# Patient Record
Sex: Male | Born: 1945
Health system: Southern US, Community
[De-identification: ages and names within clinical notes are randomized; demographics above are authoritative.]

## PROBLEM LIST (undated history)

## (undated) DIAGNOSIS — K219 Gastro-esophageal reflux disease without esophagitis: Secondary | ICD-10-CM

## (undated) DIAGNOSIS — E669 Obesity, unspecified: Secondary | ICD-10-CM

## (undated) DIAGNOSIS — N342 Other urethritis: Secondary | ICD-10-CM

## (undated) DIAGNOSIS — C801 Malignant (primary) neoplasm, unspecified: Secondary | ICD-10-CM

## (undated) DIAGNOSIS — E7800 Pure hypercholesterolemia, unspecified: Secondary | ICD-10-CM

## (undated) DIAGNOSIS — M1990 Unspecified osteoarthritis, unspecified site: Secondary | ICD-10-CM

## (undated) HISTORY — PX: COLONOSCOPY: SHX174

## (undated) HISTORY — DX: Obesity, unspecified: E66.9

## (undated) HISTORY — DX: Gastro-esophageal reflux disease without esophagitis: K21.9

## (undated) HISTORY — DX: Other urethritis: N34.2

## (undated) HISTORY — PX: JOINT REPLACEMENT: SHX530

---

## 2000-10-14 ENCOUNTER — Encounter: Payer: Self-pay | Admitting: Family Medicine

## 2000-10-14 ENCOUNTER — Encounter: Admission: RE | Admit: 2000-10-14 | Discharge: 2000-10-14 | Payer: Self-pay | Admitting: Family Medicine

## 2001-08-28 ENCOUNTER — Ambulatory Visit (HOSPITAL_COMMUNITY): Admission: RE | Admit: 2001-08-28 | Discharge: 2001-08-28 | Payer: Self-pay | Admitting: Family Medicine

## 2001-08-28 ENCOUNTER — Encounter: Payer: Self-pay | Admitting: Family Medicine

## 2005-03-16 ENCOUNTER — Ambulatory Visit (HOSPITAL_COMMUNITY): Admission: RE | Admit: 2005-03-16 | Discharge: 2005-03-16 | Payer: Self-pay | Admitting: Gastroenterology

## 2007-09-27 ENCOUNTER — Emergency Department (HOSPITAL_COMMUNITY): Admission: EM | Admit: 2007-09-27 | Discharge: 2007-09-27 | Payer: Self-pay | Admitting: Emergency Medicine

## 2007-09-27 IMAGING — CT CT HEAD W/O CM
1 series · 16 of 30 positions shown, 20 images · IV contrast (agent unspecified)
Comparison: [DATE].

CLINICAL DATA: Headache and dizziness.
 HEAD CT WITHOUT CONTRAST:
TECHNIQUE: Contiguous axial images were obtained from the base of the skull through the vertex according to standard protocol without contrast.

[Series 2: brain · axial · 0.47mm/px · z∈[+125,+274]mm · 16 of 32 slices shown, 20 images]
[im 2/32  brain]
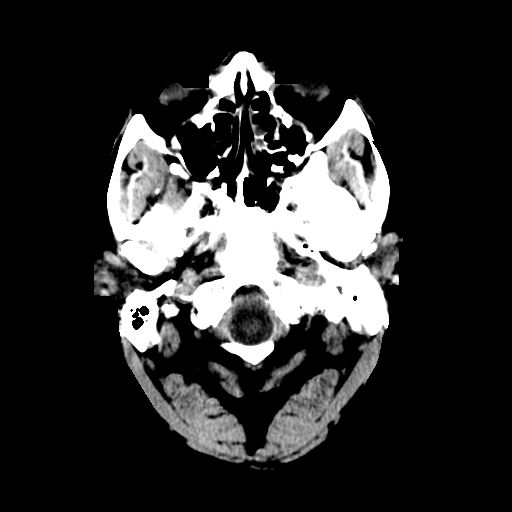
[im 2/32  bone]
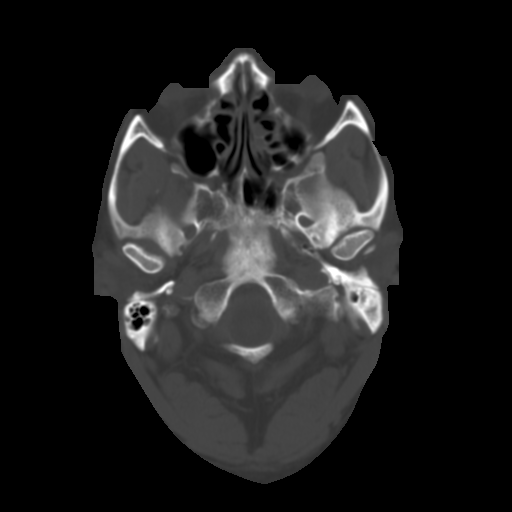
[im 4/32  brain]
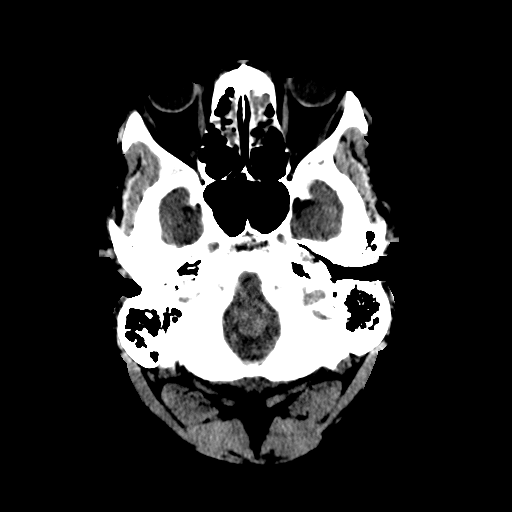
[im 6/32  brain]
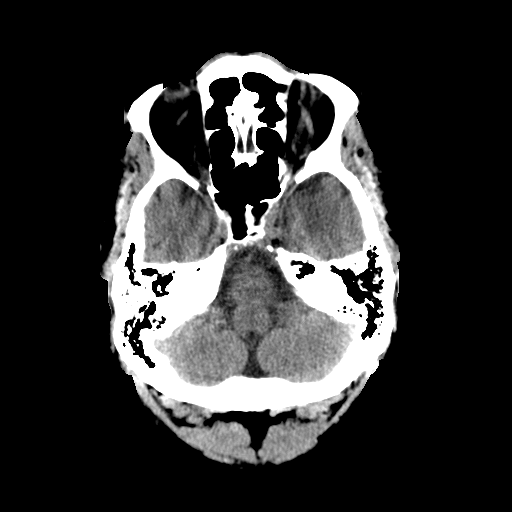
[im 8/32  brain]
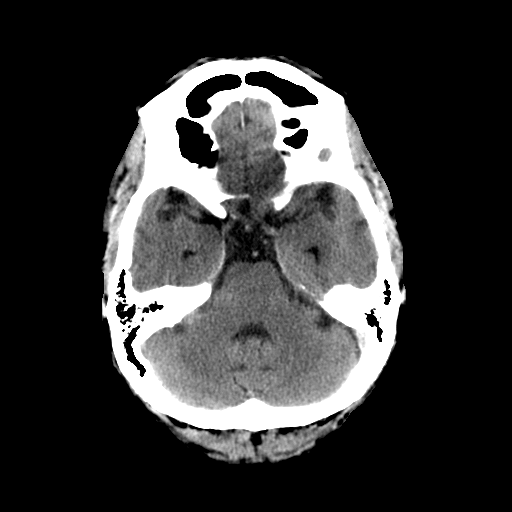
[im 9/32  brain]
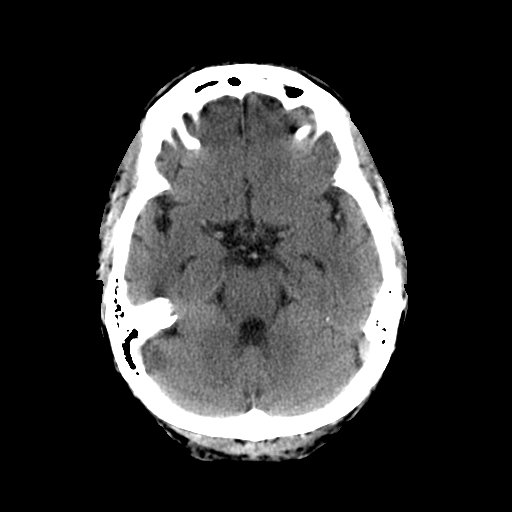
[im 9/32  bone]
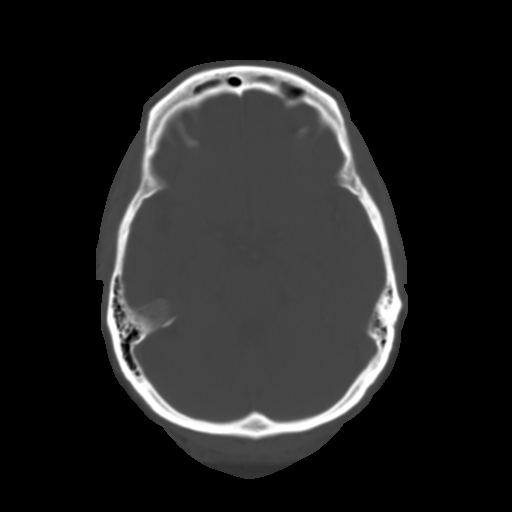
[im 11/32  brain]
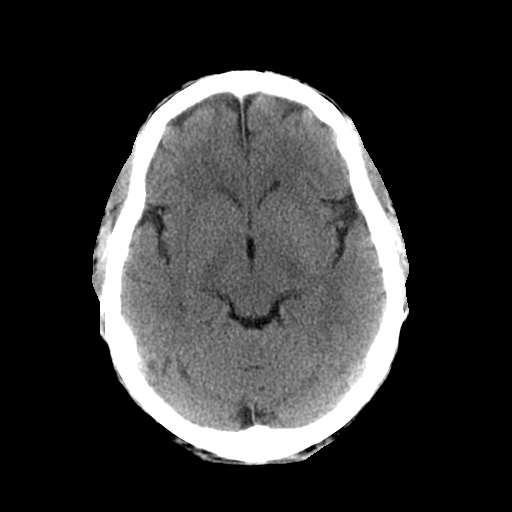
[im 13/32  brain]
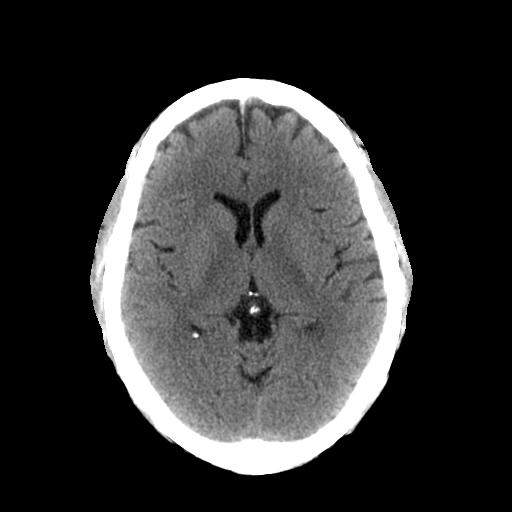
[im 15/32  brain]
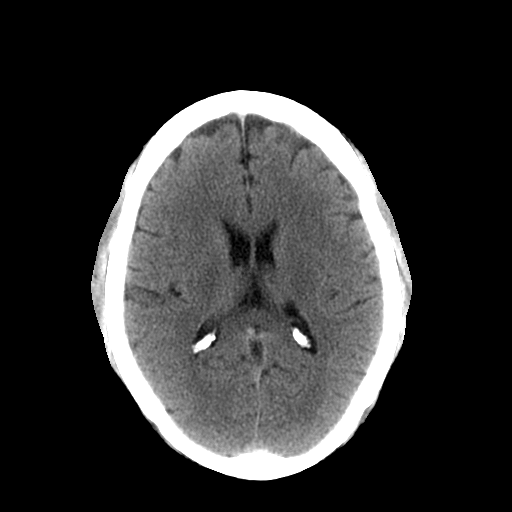
[im 17/32  brain]
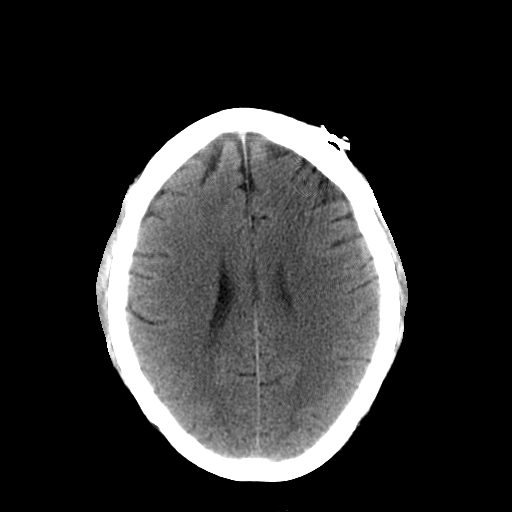
[im 17/32  bone]
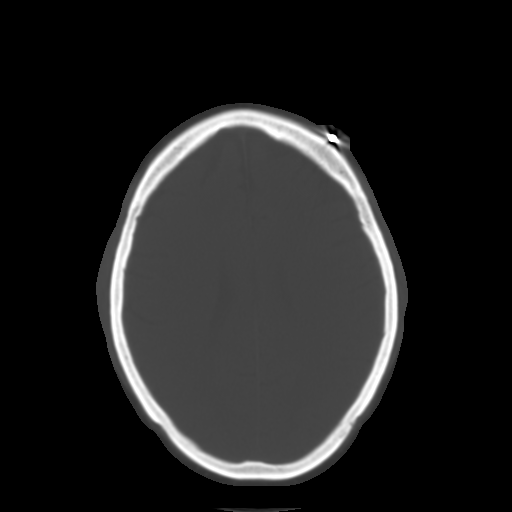
[im 19/32  brain]
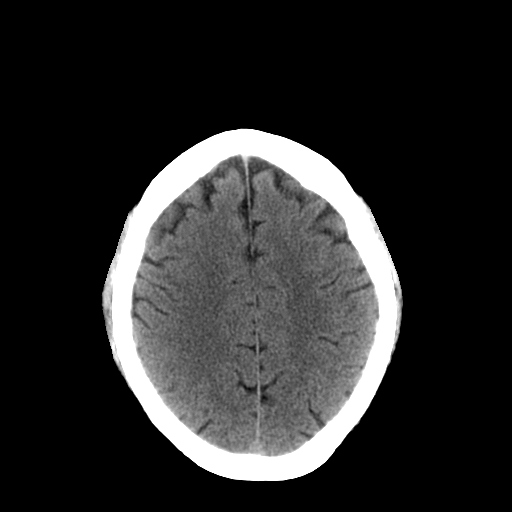
[im 21/32  brain]
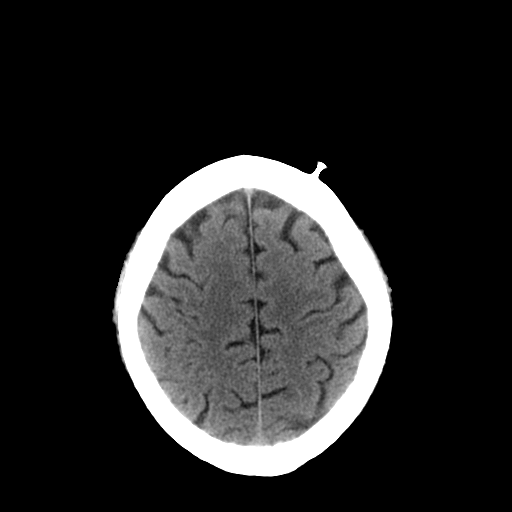
[im 23/32  brain]
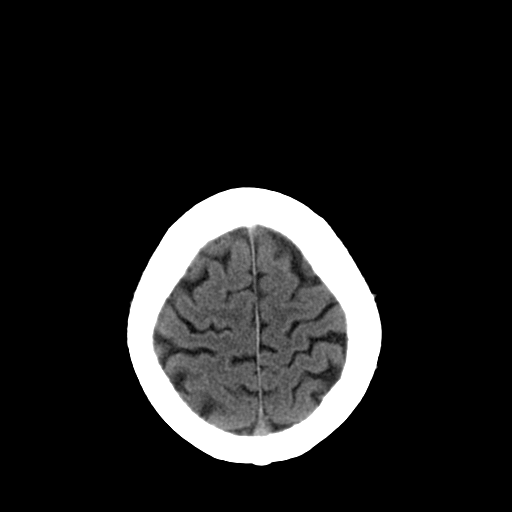
[im 24/32  brain]
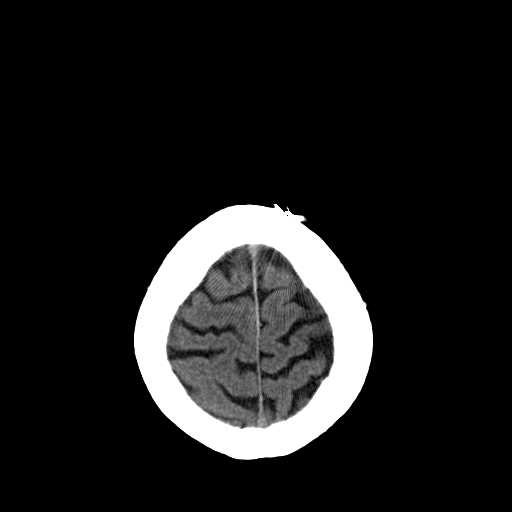
[im 24/32  bone]
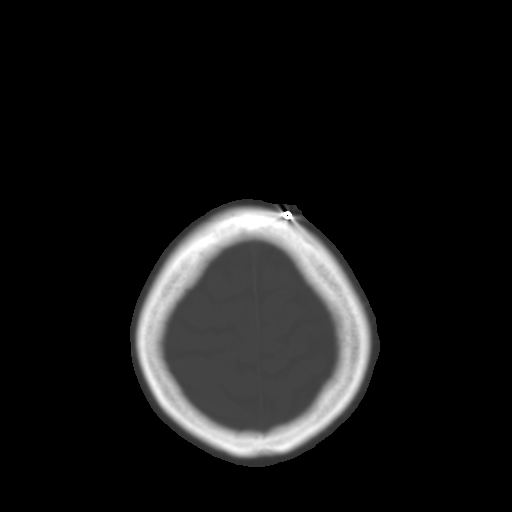
[im 26/32  brain]
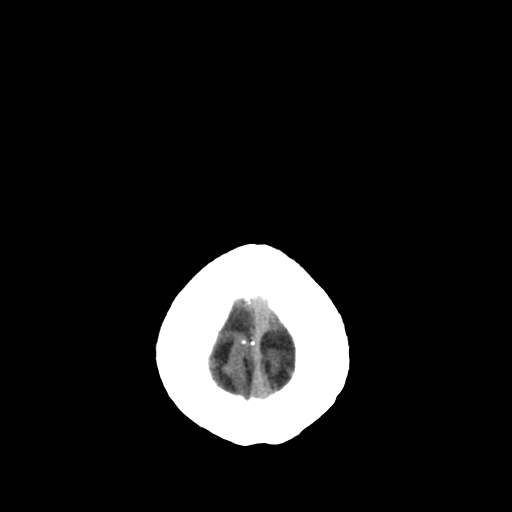
[im 28/32  brain]
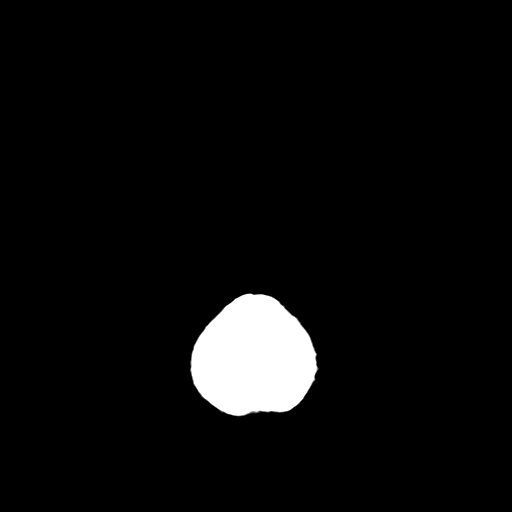
[im 30/32  brain]
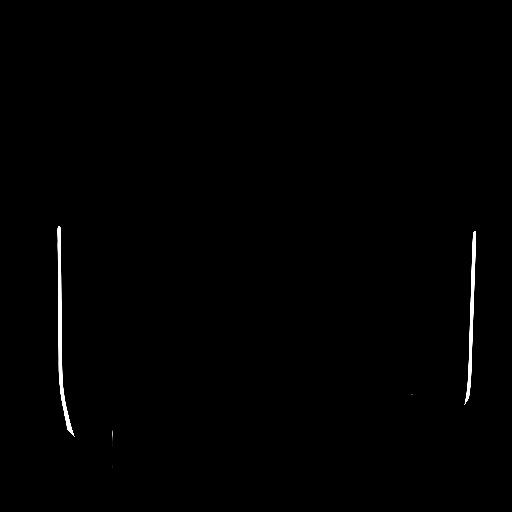

[16 of 30 positions shown; findings below may reference images not displayed]

FINDINGS: No intracranial hemorrhage. No CT evidence of large acute infarct. No intracranial mass detected on this unenhanced exam. Very minimal atrophy without hydrocephalus. Ethmoid sinus air cell and left maxillary sinus mucosal thickening. Minimal mucosal thickening left sphenoid sinus air cell.  Small radiopaque structure left frontal region unchanged.
IMPRESSION: 1.  No intracranial hemorrhage.
 2.  No CT evidence of large acute infarct.
 3.  No intracranial mass detected on this unenhanced exam.
 4.  Mild paranasal sinus mucosal thickening.

## 2011-05-07 LAB — CBC
HCT: 37.9 — ABNORMAL LOW
Hemoglobin: 13.1
MCHC: 34.6
MCV: 92.6
Platelets: 188
RBC: 4.1 — ABNORMAL LOW
RDW: 12.4
WBC: 10.8 — ABNORMAL HIGH

## 2011-05-07 LAB — POCT I-STAT CREATININE
Creatinine, Ser: 1.2
Operator id: 234501

## 2011-05-07 LAB — CK TOTAL AND CKMB (NOT AT ARMC)
CK, MB: 2.3
Relative Index: 1
Total CK: 223

## 2011-05-07 LAB — I-STAT 8, (EC8 V) (CONVERTED LAB)
Acid-Base Excess: 3 — ABNORMAL HIGH
BUN: 9
Bicarbonate: 28.4 — ABNORMAL HIGH
Chloride: 104
Glucose, Bld: 97
HCT: 43
Hemoglobin: 14.6
Operator id: 234501
Potassium: 4.2
Sodium: 137
TCO2: 30
pCO2, Ven: 43.2 — ABNORMAL LOW
pH, Ven: 7.426 — ABNORMAL HIGH

## 2011-05-07 LAB — DIFFERENTIAL
Basophils Absolute: 0
Basophils Relative: 0
Eosinophils Absolute: 0.4
Eosinophils Relative: 3
Lymphocytes Relative: 25
Lymphs Abs: 2.7
Monocytes Absolute: 0.6
Monocytes Relative: 5
Neutro Abs: 7.2
Neutrophils Relative %: 66

## 2011-05-07 LAB — TROPONIN I: Troponin I: 0.01

## 2011-09-29 ENCOUNTER — Emergency Department (HOSPITAL_COMMUNITY): Payer: Medicare Other

## 2011-09-29 ENCOUNTER — Encounter (HOSPITAL_COMMUNITY): Payer: Self-pay | Admitting: Emergency Medicine

## 2011-09-29 ENCOUNTER — Emergency Department (HOSPITAL_COMMUNITY)
Admission: EM | Admit: 2011-09-29 | Discharge: 2011-09-29 | Disposition: A | Discharge status: Home / Self Care | Payer: Medicare Other | Attending: Emergency Medicine | Admitting: Emergency Medicine

## 2011-09-29 DIAGNOSIS — T148XXA Other injury of unspecified body region, initial encounter: Secondary | ICD-10-CM

## 2011-09-29 DIAGNOSIS — IMO0002 Reserved for concepts with insufficient information to code with codable children: Secondary | ICD-10-CM

## 2011-09-29 DIAGNOSIS — W19XXXA Unspecified fall, initial encounter: Secondary | ICD-10-CM | POA: Insufficient documentation

## 2011-09-29 DIAGNOSIS — S61209A Unspecified open wound of unspecified finger without damage to nail, initial encounter: Secondary | ICD-10-CM | POA: Insufficient documentation

## 2011-09-29 IMAGING — CR DG HAND COMPLETE 3+V*R*
3 series · 3 of 3 positions shown · non-contrast
Comparison: None.

CLINICAL DATA: Fall with right fifth finger pain and laceration.

RIGHT HAND - COMPLETE 3+ VIEW

[x hand pa right]
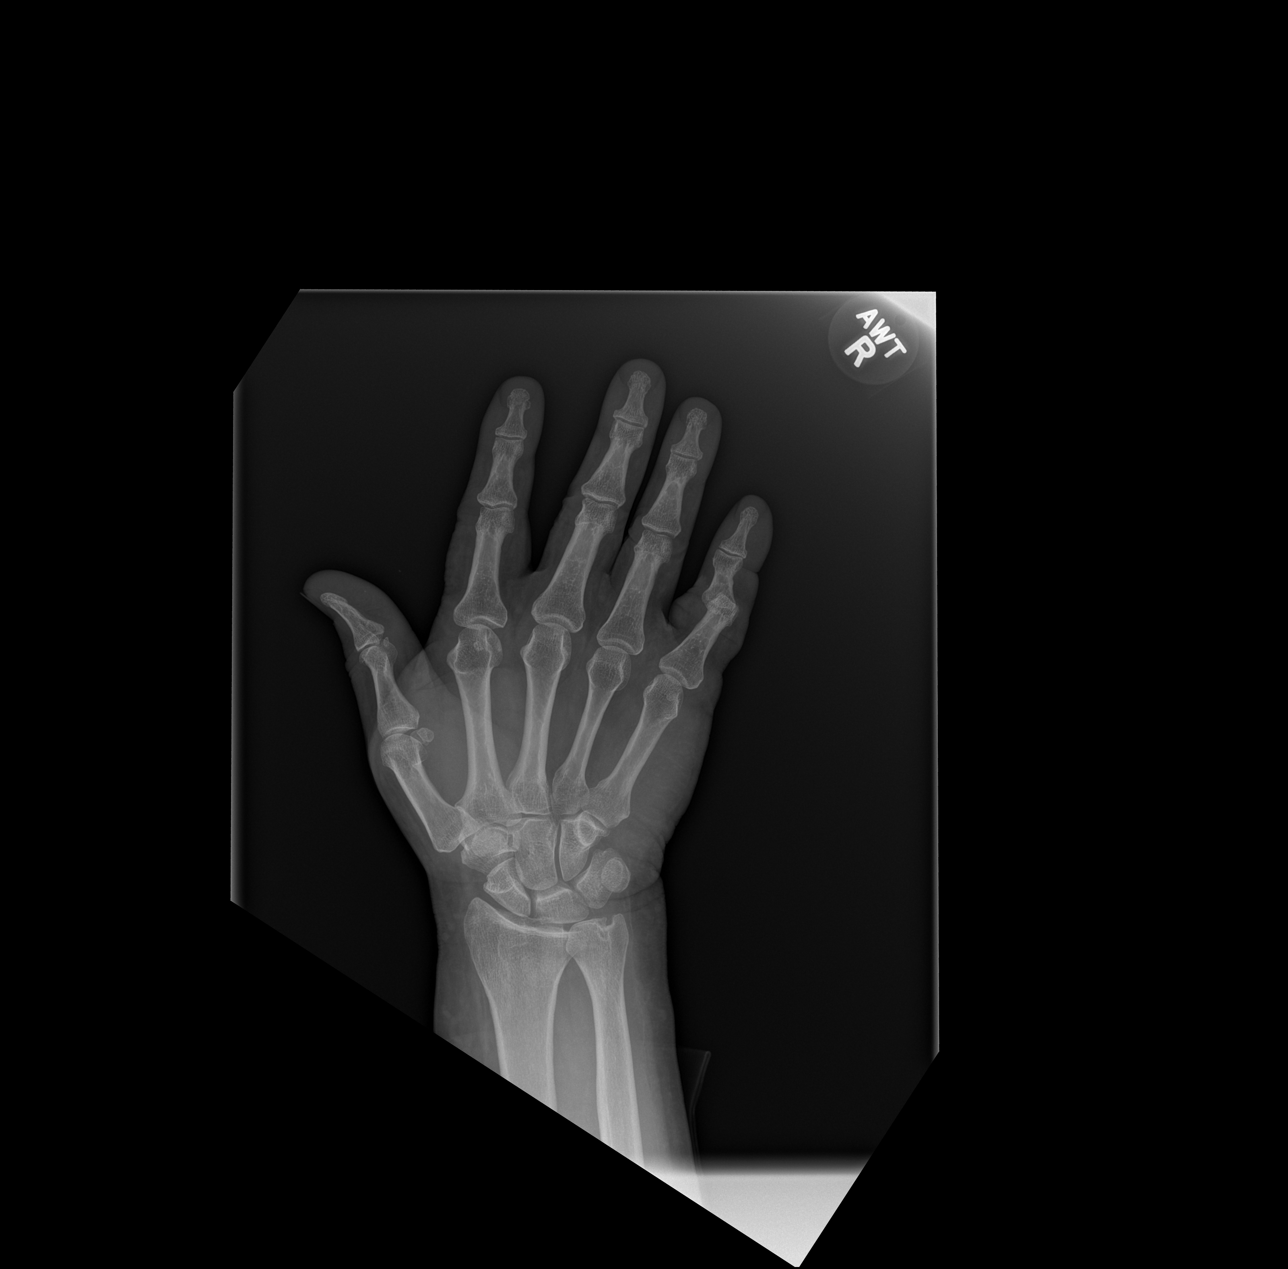

[x hand obl right]
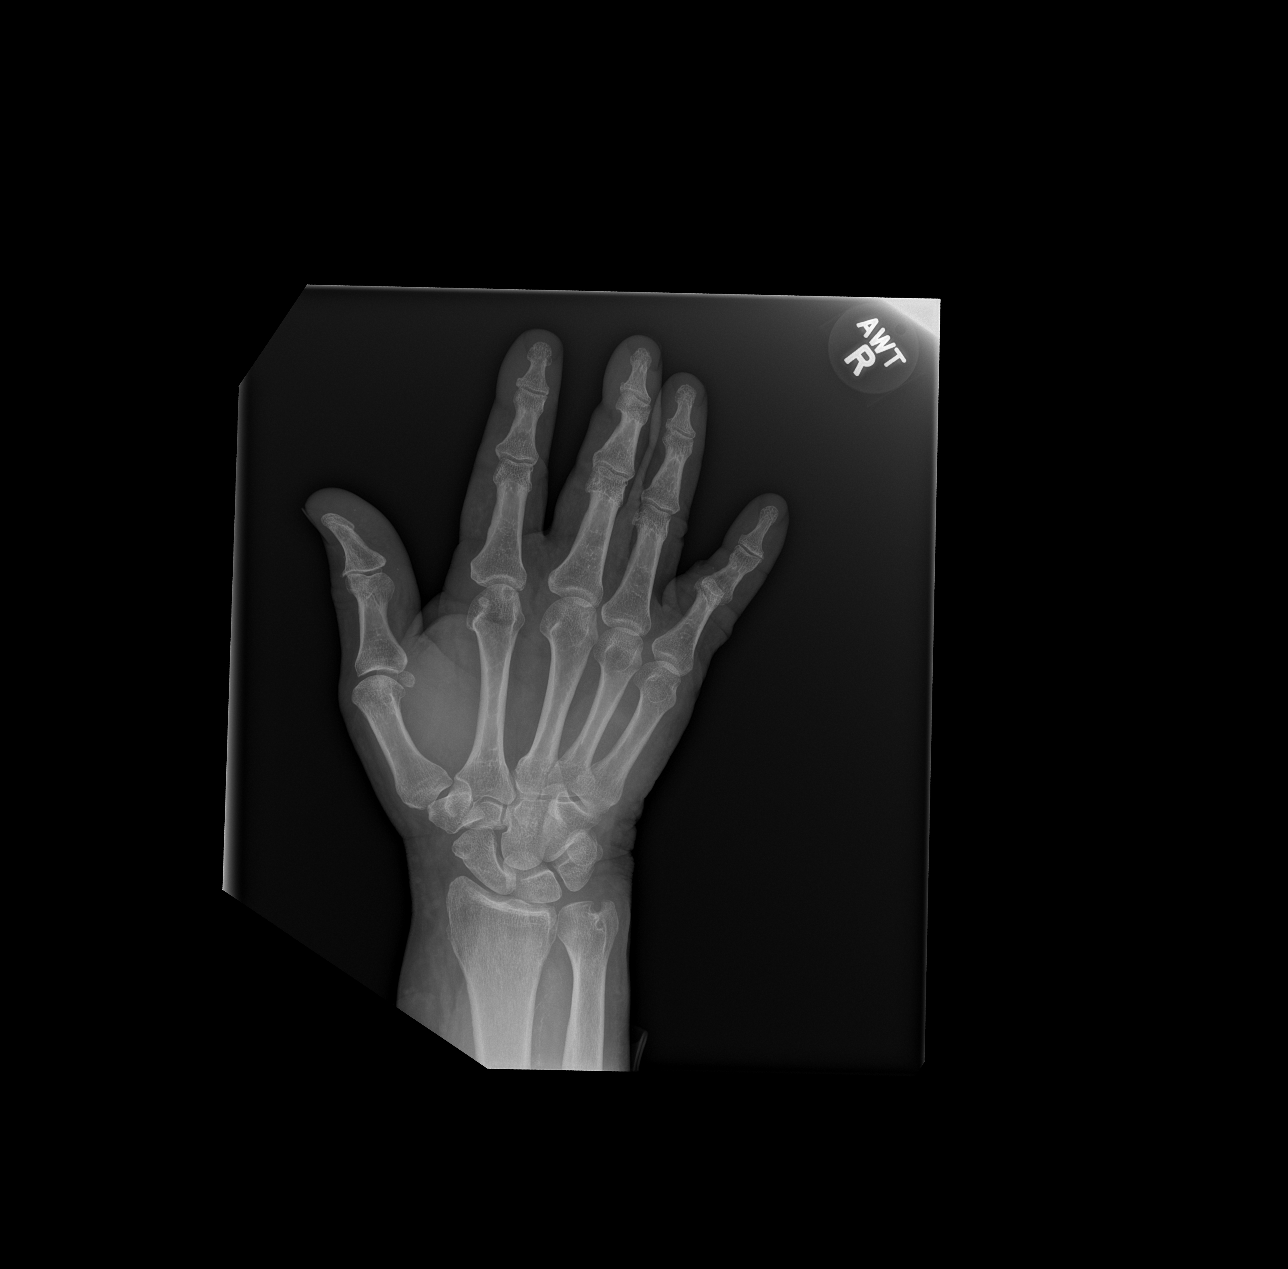

[x hand lat right]
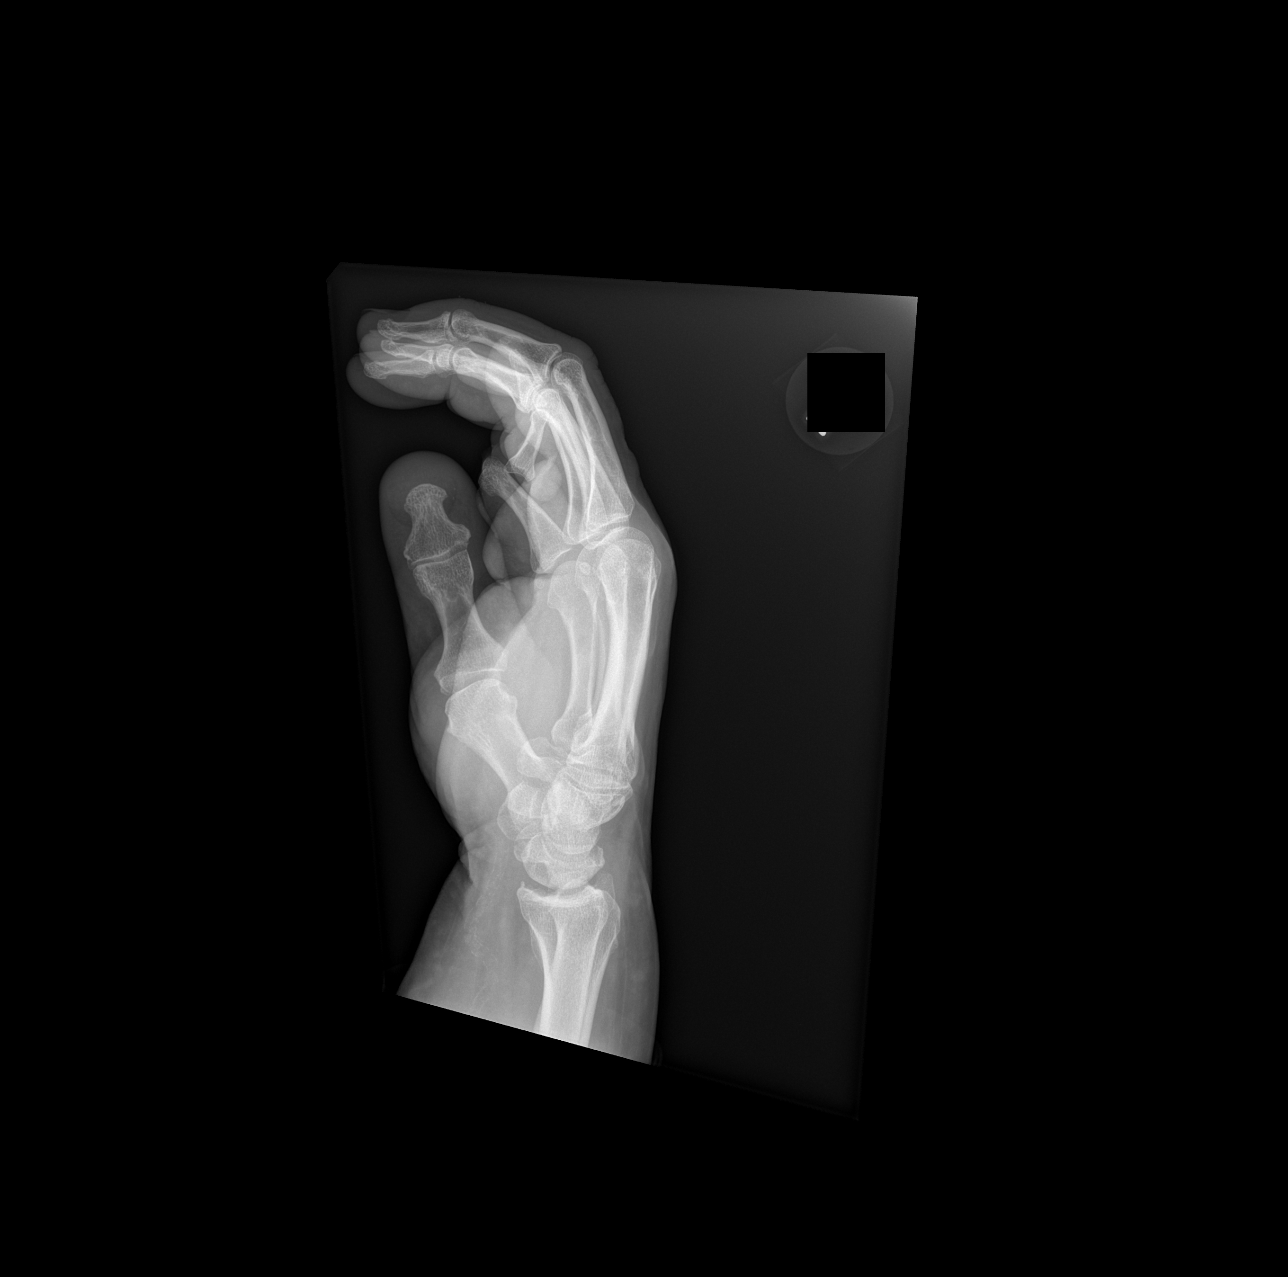

[3 of 3 positions shown; findings below may reference images not displayed]

FINDINGS: The fifth proximal interphalangeal joint is dislocated,
with override and dorsal displacement of the middle phalanx.  No
definite associated fracture.  Overlying soft tissue injury.  There
may be faint radiodense debris associated with the laceration.
IMPRESSION: Fifth proximal interphalangeal joint dislocation.

## 2011-09-29 MED ORDER — CEFAZOLIN SODIUM 1-5 GM-% IV SOLN
1.0000 g | Freq: Once | INTRAVENOUS | Status: AC
  Administered 2011-09-29 (×2): 1 g via INTRAVENOUS
  Filled 2011-09-29: qty 50

## 2011-09-29 MED ORDER — TETANUS-DIPHTH-ACELL PERTUSSIS 5-2.5-18.5 LF-MCG/0.5 IM SUSP
0.5000 mL | Freq: Once | INTRAMUSCULAR | Status: AC
  Administered 2011-09-29: 0.5 mL via INTRAMUSCULAR
  Filled 2011-09-29: qty 0.5

## 2011-09-29 MED ORDER — CEPHALEXIN 500 MG PO CAPS: 500.0000 mg | ORAL_CAPSULE | Freq: Four times a day (QID) | ORAL | Status: AC

## 2011-09-29 MED ORDER — LIDOCAINE HCL 2 % IJ SOLN
INTRAMUSCULAR | Status: AC
  Filled 2011-09-29: qty 1

## 2011-09-29 MED ORDER — OXYCODONE-ACETAMINOPHEN 5-325 MG PO TABS: 1.0000 | ORAL_TABLET | Freq: Four times a day (QID) | ORAL | Status: AC | PRN

## 2011-09-29 NOTE — Discharge Instructions (Signed)
Call Dr. Ronie Spies office today to schedule a follow up appointment for tomorrow or Friday morning. Follow up with your doctor, an urgent care, or this Emergency Department for removal of your stitches in 7 days. Do not submerge the stitches in water for the first 24 hours. Take your pain medication as prescribed. Do not operate heavy machinery while on pain medication. Note that your pain medication contains acetaminophen (Tylenol) & its is not reccommended that you use additional acetaminophen (Tylenol) while taking this medication. Read instructions below.  TREATMENT   Keep the wound clean and dry.   If you were given a bandage (dressing), you should change it at least once a day. Also, change the dressing if it becomes wet or dirty, or as directed by your caregiver.   Wash the wound with soap and water 2 times a day. Rinse the wound off with water to remove all soap. Pat the wound dry with a clean towel.   You may shower as usual after the first 24 hours. Do not soak the wound in water until the sutures are removed.   Once the wound has healed, scarring can be minimized by covering the wound with sunscreen during the day for 1 full year.Marland Kitchen   SEEK MEDICAL CARE IF:   You have redness, swelling, or increasing pain in the wound.   You see a red line that goes away from the wound.   You have yellowish-white fluid (pus) coming from the wound.   You have a fever.   You notice a bad smell coming from the wound or dressing.   Your wound breaks open before or after sutures have been removed.   You notice something coming out of the wound such as wood or glass.   Your wound is on your hand or foot and you cannot move a finger or toe.   Your pain is not controlled with prescribed medicine.   If you did not receive a tetanus shot today because you thought you were up to date, but did not recall when your last one was given, make sure to check with your primary caregiver to determine if you  need one.

## 2011-09-29 NOTE — ED Notes (Signed)
Correction right pinky

## 2011-09-29 NOTE — ED Provider Notes (Signed)
Medical screening examination/treatment/procedure(s) were conducted as a shared visit with non-physician practitioner(s) and myself.  I personally evaluated the patient during the encounter  Open R 5 th PIP dislocation. Reduced in ED, closed. Td updated, IV abx. Discussed with Hand. F/u in 1-2 days.   Loren Racer, MD 09/29/11 [phone]

## 2011-09-29 NOTE — ED Notes (Signed)
Lost his "footing" and fell on right hand. Obvious exposed bone. Bleeding controlled

## 2011-09-29 NOTE — ED Notes (Signed)
Confirmed tendon exposure by Lissette PA

## 2011-09-29 NOTE — ED Provider Notes (Signed)
History     CSN: [id number]  Arrival date & time 09/29/11  1256   First MD Initiated Contact with Patient 09/29/11 1307      Chief Complaint  Patient presents with  . Finger Injury    (Consider location/radiation/quality/duration/timing/severity/associated sxs/prior treatment) HPI Comments: Patient presents emergency department with a finger laceration on his right hand little finger. Patient states he lost stepping and fell.  When he fell he dislocated his pinky. Patient states he is unable to bend his finger. Patient states that it has been bleeding sense and he sees something light.  Patient denies syncope, chest pain, shortness of breath, HA, dizziness, light headedness. Injury occurred 12:30PM. Tetanus status unknown. Denies fever, night sweats and chills.   The history is provided by the patient.    History reviewed. No pertinent past medical history.  History reviewed. No pertinent past surgical history.  No family history on file.  History  Substance Use Topics  . Smoking status: Never Smoker   . Smokeless tobacco: Not on file  . Alcohol Use: No      Review of Systems  Constitutional: Negative for fever, chills and appetite change.  HENT: Negative for congestion.   Eyes: Negative for visual disturbance.  Respiratory: Negative for shortness of breath.   Cardiovascular: Negative for chest pain and leg swelling.  Gastrointestinal: Negative for abdominal pain.  Genitourinary: Negative for dysuria, urgency and frequency.  Skin: Positive for wound.  Neurological: Negative for dizziness, syncope, weakness, light-headedness, numbness and headaches.  Psychiatric/Behavioral: Negative for confusion.    Allergies  Review of patient's allergies indicates no known allergies.  Home Medications  No current outpatient prescriptions on file.  BP 114/77  Pulse 93  Temp(Src) 97.9 F (36.6 C) (Oral)  Resp 23  Wt 250 lb (113.399 kg)  SpO2 99%  Physical Exam  Nursing  note and vitals reviewed. Constitutional: He is oriented to person, place, and time. He appears well-developed and well-nourished. No distress.  HENT:  Head: Normocephalic and atraumatic.  Eyes: Conjunctivae and EOM are normal.  Neck: Normal range of motion.  Cardiovascular: Normal rate and regular rhythm.   Pulmonary/Chest: Effort normal.  Musculoskeletal: Normal range of motion.       3 cm open laceration of right small finger. Bone exposed via dislocation. Pt unable to perform flexion. Sensory intact. No ttp of wrist, FROM active an passive of right rist, pulse intact.   Neurological: He is alert and oriented to person, place, and time.  Skin: Skin is warm and dry. No rash noted. He is not diaphoretic.  Psychiatric: He has a normal mood and affect. His behavior is normal.    ED Course  Procedures (including critical care time)  Labs Reviewed - No data to display Dg Hand Complete Right  09/29/2011  *RADIOLOGY REPORT*  Clinical Data: Fall with right fifth finger pain and laceration.  RIGHT HAND - COMPLETE 3+ VIEW  Comparison: None.  Findings: The fifth proximal interphalangeal joint is dislocated, with override and dorsal displacement of the middle phalanx.  No definite associated fracture.  Overlying soft tissue injury.  There may be faint radiodense debris associated with the laceration.  IMPRESSION: Fifth proximal interphalangeal joint dislocation.  Original Report Authenticated By: Reyes Ivan, M.D.   LACERATION REPAIR Performed by: Jaci Carrel Authorized by: Jaci Carrel Consent: Verbal consent obtained. Risks and benefits: risks, benefits and alternatives were discussed Consent given by: patient Patient identity confirmed: provided demographic data Prepped and Draped in normal sterile fashion  Wound explored  Laceration Location: palmer surface, right hand, sm finger  Laceration Length: 3cm  No Foreign Bodies seen or palpated  Anesthesia: local infiltration  Local  anesthetic: lidocaine 2 % with out epinephrine  Anesthetic total: 2 ml, digital block   Irrigation method: syringe Amount of cleaning: moderate  Skin closure: 4.0 prolene  Number of sutures: 6  Technique: simple interupted  Patient tolerance: Patient tolerated the procedure well with no immediate complications.   No diagnosis found.    MDM  Joint dislocation, open   Fifth proximal interphalangeal joint dislocation with laceration.  Wound explored with moderate cleaning.  Patient joint relocated and laceration repaired after digital block performed. No evidence of tendon involvement after relocation. Patient able to flex and extend digit. Patient tolerated well. Pt to be discharged home with followup with Dr. Mina Marble, hand orthopedic.  Patient is advised to call his office this afternoon to schedule followup appointment for tomorrow or Friday AM.  Patient received 1 g IV Ancef & will be discharged with Keflex and pain medication. Patient is clinically s.table prior to discharge, has no complaints, and is agreeable with plan to followup. This patient was seen with and discussed with Dr. Ranae Palms who agrees with above plan.           Jaci Carrel, PA-C 09/29/11 [address], [place] 09/29/11 [phone]

## 2012-01-22 DIAGNOSIS — Z0000 Encounter for general adult medical examination without abnormal findings: Secondary | ICD-10-CM

## 2013-03-20 ENCOUNTER — Telehealth: Payer: Self-pay | Admitting: Family Medicine

## 2013-03-20 ENCOUNTER — Ambulatory Visit (INDEPENDENT_AMBULATORY_CARE_PROVIDER_SITE_OTHER): Payer: Medicare Other | Admitting: Family Medicine

## 2013-03-20 ENCOUNTER — Encounter: Payer: Self-pay | Admitting: Family Medicine

## 2013-03-20 VITALS — BP 119/65 | HR 60 | Temp 98.1°F | Ht 66.5 in | Wt 273.8 lb

## 2013-03-20 DIAGNOSIS — N342 Other urethritis: Secondary | ICD-10-CM

## 2013-03-20 DIAGNOSIS — N451 Epididymitis: Secondary | ICD-10-CM | POA: Insufficient documentation

## 2013-03-20 DIAGNOSIS — E669 Obesity, unspecified: Secondary | ICD-10-CM

## 2013-03-20 DIAGNOSIS — Z119 Encounter for screening for infectious and parasitic diseases, unspecified: Secondary | ICD-10-CM

## 2013-03-20 DIAGNOSIS — R300 Dysuria: Secondary | ICD-10-CM

## 2013-03-20 DIAGNOSIS — N453 Epididymo-orchitis: Secondary | ICD-10-CM

## 2013-03-20 LAB — POCT UA - MICROSCOPIC ONLY
Bacteria, U Microscopic: NEGATIVE
Casts, Ur, LPF, POC: NEGATIVE
Crystals, Ur, HPF, POC: NEGATIVE
Mucus, UA: NEGATIVE
RBC, urine, microscopic: NEGATIVE
WBC, Ur, HPF, POC: NEGATIVE
Yeast, UA: NEGATIVE

## 2013-03-20 LAB — POCT URINALYSIS DIPSTICK
Bilirubin, UA: NEGATIVE
Blood, UA: NEGATIVE
Glucose, UA: NEGATIVE
Ketones, UA: NEGATIVE
Leukocytes, UA: NEGATIVE
Nitrite, UA: NEGATIVE
Spec Grav, UA: 1.02
Urobilinogen, UA: NEGATIVE
pH, UA: 7

## 2013-03-20 MED ORDER — DOXYCYCLINE HYCLATE 100 MG PO TABS: 100.0000 mg | ORAL_TABLET | Freq: Two times a day (BID) | ORAL | Status: DC

## 2013-03-20 NOTE — Patient Instructions (Signed)
Urethritis, Adult Urethritis is an inflammation (soreness) of the urethra (the tube exiting from the bladder). It is often caused by germs that may be spread through sexual contact. TREATMENT  Urethritis will usually respond to antibiotics. These are medications that kill germs. Take all the medicine given to you. You may feel better in a couple days, but TAKE ALL MEDICINE or the infection may not be completely cured and may become more difficult to treat. Response can generally be expected in 7 to 10 days. You may require additional treatment after more testing. HOME CARE INSTRUCTIONS  Not have sex until the test results are known and treatment is completed.  Know that you may be asked to notify your sex partner when your final test results are back.  Finish all medications as prescribed.  Prevent sexually transmitted infections including AIDS. Practice safe sex. Use condoms. SEEK MEDICAL CARE IF:   Your symptoms are not improved in 2 to 3 days.  Your symptoms are getting worse.  Your develop abdominal pain.  You develop joint pain. SEEK IMMEDIATE MEDICAL CARE IF:   You have a fever.  You develop severe pain in the belly, back or side.  You develop repeated vomiting. TEST RESULTS Not all test results are available during your visit. If your test results are not back during the visit, make an appointment with your caregiver to find out the results. Do not assume everything is normal if you have not heard from your caregiver or the medical facility. It is important for you to follow-up on all of your test results. Document Released: 01/26/2001 Document Revised: 10/25/2011 Document Reviewed: 08/18/2009 [facility] Patient Information 2014 [place], [place].        Dr Woodroe Mode Recommendations  Diet and Exercise discussed with patient.  For nutrition information, I recommend books:  1).Eat to Live by Dr Monico Hoar. 2).Prevent and Reverse Heart Disease by Dr Suzzette Righter. 3) Dr Katherina Right Book:  Program to Reverse Diabetes  Exercise recommendations are:  If unable to walk, then the patient can exercise in a chair 3 times a day. By flapping arms like a bird gently and raising legs outwards to the front.  If ambulatory, the patient can go for walks for 30 minutes 3 times a week. Then increase the intensity and duration as tolerated.  Goal is to try to attain exercise frequency to 5 times a week.  If applicable: Best to perform resistance exercises (machines or weights) 2 days a week and cardio type exercises 3 days per week.

## 2013-03-20 NOTE — Telephone Encounter (Signed)
Pt aware of 2 wk appt -

## 2013-03-20 NOTE — Progress Notes (Signed)
Patient ID: Derek [name]., male   DOB: 1945/11/10, 67 y.o.   MRN: [id number] SUBJECTIVE: CC: Chief Complaint  Patient presents with  . Acute Visit    ?prostate inf c/o little burning    HPI: Tip of penis burns when he urinates. No d/c, no rashes. Sex+++ partner for 2 months. She is 42 years old.  Wants to learn how to eat right and  Diet and exercise.  Past Medical History  Diagnosis Date  . GERD (gastroesophageal reflux disease)    No past surgical history on file. History   Social History  . Marital Status: Married    Spouse Name: N/A    Number of Children: N/A  . Years of Education: N/A   Occupational History  . Not on file.   Social History Main Topics  . Smoking status: Never Smoker   . Smokeless tobacco: Not on file  . Alcohol Use: No  . Drug Use: No  . Sexually Active:    Other Topics Concern  . Not on file   Social History Narrative  . No narrative on file   No family history on file. No current outpatient prescriptions on file prior to visit.   No current facility-administered medications on file prior to visit.   No Known Allergies Immunization History  Administered Date(s) Administered  . Tdap 09/29/2011   Prior to Admission medications   Medication Sig Start Date End Date Taking? Authorizing Provider  omeprazole (PRILOSEC) 20 MG capsule Take 20 mg by mouth daily.   Yes Historical Provider, MD     ROS: As above in the HPI. All other systems are stable or negative.  OBJECTIVE: APPEARANCE:  Patient in no acute distress.The patient appeared well nourished and normally developed. Acyanotic. Waist: VITAL SIGNS:BP 119/65  Pulse 60  Temp(Src) 98.1 F (36.7 C) (Oral)  Ht 5' 6.5" (1.689 m)  Wt 273 lb 12.8 oz (124.195 kg)  BMI 43.54 kg/m2 AAM Obese  SKIN: warm and  Dry without overt rashes, tattoos and scars  HEAD and Neck: without JVD, Head and scalp: normal Eyes:No scleral icterus. Fundi normal, eye movements normal. Ears: Auricle  normal, canal normal, Tympanic membranes normal, insufflation normal. Nose: normal Throat: normal Neck & thyroid: normal  CHEST & LUNGS: Chest wall: normal Lungs: Clear  CVS: Reveals the PMI to be normally located. Regular rhythm, First and Second Heart sounds are normal,  absence of murmurs, rubs or gallops. Peripheral vasculature: Radial pulses: normal Dorsal pedis pulses: normal Posterior pulses: normal  ABDOMEN:  Appearance: Obese Benign, no organomegaly, no masses, no Abdominal Aortic enlargement. No Guarding , no rebound. No Bruits. Bowel sounds: normal  RECTAL: N/A GU: N/A  EXTREMETIES: nonedematous. Both Femoral and Pedal pulses are normal.  MUSCULOSKELETAL:  Spine: normal Joints: intact  NEUROLOGIC: oriented to time,place and person; nonfocal. Strength is normal Sensory is normal Reflexes are normal Cranial Nerves are normal.  ASSESSMENT: Burning with urination - Plan: POCT urinalysis dipstick, POCT UA - Microscopic Only, Urine culture, GC/Chlamydia Probe Amp, CANCELED: GC/Chlamydia Probe Amp  Urethritis - Plan: Urine culture, doxycycline (VIBRA-TABS) 100 MG tablet, GC/Chlamydia Probe Amp, CANCELED: GC/Chlamydia Probe Amp, CANCELED: GC/Chlamydia Probe Amp  Epididymitis - Plan: GC/Chlamydia Probe Amp, CANCELED: GC/Chlamydia Probe Amp, CANCELED: GC/Chlamydia Probe Amp  Screening examination for infectious disease - Plan: Hepatitis panel, acute, RPR, HIV antibody, Herpes simplex typing, GC/Chlamydia Probe Amp, CANCELED: GC/Chlamydia Probe Amp  Obesity, unspecified   In view of his h/o treated for a STD when in the [employer],  we discussed screening for infectious disease.  PLAN: Orders Placed This Encounter  Procedures  . Urine culture  . Herpes simplex typing  . GC/Chlamydia Probe Amp  . Hepatitis panel, acute  . RPR  . HIV antibody  . POCT urinalysis dipstick  . POCT UA - Microscopic Only    Safe sex. Use a condom. Ensure his partner has the  relevant STD screens.  Meds ordered this encounter  Medications  . omeprazole (PRILOSEC) 20 MG capsule    Sig: Take 20 mg by mouth daily.  Marland Kitchen doxycycline (VIBRA-TABS) 100 MG tablet    Sig: Take 1 tablet (100 mg total) by mouth 2 (two) times daily.    Dispense:  20 tablet    Refill:  0    Results for orders placed in visit on 03/20/13  POCT URINALYSIS DIPSTICK      Result Value Range   Color, UA amber     Clarity, UA clear     Glucose, UA neg     Bilirubin, UA neg     Ketones, UA neg     Spec Grav, UA 1.020     Blood, UA neg     pH, UA 7.0     Protein, UA small     Urobilinogen, UA negative     Nitrite, UA neg     Leukocytes, UA Negative    POCT UA - MICROSCOPIC ONLY      Result Value Range   WBC, Ur, HPF, POC neg     RBC, urine, microscopic neg     Bacteria, U Microscopic neg     Mucus, UA neg     Epithelial cells, urine per micros occ     Crystals, Ur, HPF, POC neg     Casts, Ur, LPF, POC neg     Yeast, UA neg          Dr Woodroe Mode Recommendations  Diet and Exercise discussed with patient.  For nutrition information, I recommend books:  1).Eat to Live by Dr Monico Hoar. 2).Prevent and Reverse Heart Disease by Dr Suzzette Righter. 3) Dr Katherina Right Book:  Program to Reverse Diabetes  Exercise recommendations are:  If unable to walk, then the patient can exercise in a chair 3 times a day. By flapping arms like a bird gently and raising legs outwards to the front.  If ambulatory, the patient can go for walks for 30 minutes 3 times a week. Then increase the intensity and duration as tolerated.  Goal is to try to attain exercise frequency to 5 times a week.  If applicable: Best to perform resistance exercises (machines or weights) 2 days a week and cardio type exercises 3 days per week.  Return in about 2 weeks (around 04/03/2013) for Recheck medical problems.  Joffrey Kerce P. [place]esto Charon, M.D.

## 2013-03-21 LAB — SPECIMEN STATUS REPORT

## 2013-03-21 LAB — URINE CULTURE

## 2013-03-21 LAB — RPR: RPR: NONREACTIVE

## 2013-03-21 LAB — HEPATITIS PANEL, ACUTE
Hep A IgM: NEGATIVE
Hep B C IgM: NEGATIVE
Hep C Virus Ab: <0.1 s/co ratio (ref 0.0–0.9)
Hepatitis B Surface Ag: NEGATIVE

## 2013-03-21 LAB — HIV ANTIBODY (ROUTINE TESTING W REFLEX)
HIV 1/O/2 Abs-Index Value: <1 (ref <1.00)
HIV-1/HIV-2 Ab: NONREACTIVE

## 2013-03-21 LAB — HERPES SIMPLEX TYPING

## 2013-03-22 LAB — HSV 1 AND 2 IGM ABS, INDIRECT
HSV 1 IgM: <1:10 {titer}
HSV 2 IgM: <1:10 {titer}

## 2013-03-22 LAB — HSV(HERPES SIMPLEX VRS) I + II AB-IGG
HAV 1 IGG,TYPE SPECIFIC AB: 50.9 index — ABNORMAL HIGH (ref 0.00–0.90)
HSV 2 IGG,TYPE SPECIFIC AB: <0.91 index (ref 0.00–0.90)

## 2013-03-22 LAB — SPECIMEN STATUS REPORT

## 2013-03-24 LAB — GC/CHLAMYDIA PROBE AMP
Chlamydia trachomatis, NAA: NEGATIVE
Neisseria gonorrhoeae by PCR: NEGATIVE

## 2013-04-02 ENCOUNTER — Telehealth: Payer: Self-pay | Admitting: *Deleted

## 2013-04-02 NOTE — Telephone Encounter (Signed)
Message copied by Magdalene River on Mon Apr 02, 2013 11:08 AM ------      Message from: Ileana Ladd      Created: Fri Mar 30, 2013  1:59 PM       Labs look good. 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      This would give him fever blisters.      The rest was negative.      OV to review if he has questions.       ------

## 2013-04-02 NOTE — Telephone Encounter (Signed)
Pt called on labs. 

## 2013-04-03 ENCOUNTER — Encounter: Payer: Self-pay | Admitting: Family Medicine

## 2013-04-03 ENCOUNTER — Ambulatory Visit (INDEPENDENT_AMBULATORY_CARE_PROVIDER_SITE_OTHER): Payer: Medicare Other | Admitting: Family Medicine

## 2013-04-03 VITALS — BP 109/70 | HR 60 | Temp 97.4°F | Wt 269.2 lb

## 2013-04-03 DIAGNOSIS — N342 Other urethritis: Secondary | ICD-10-CM

## 2013-04-03 DIAGNOSIS — K219 Gastro-esophageal reflux disease without esophagitis: Secondary | ICD-10-CM | POA: Insufficient documentation

## 2013-04-03 DIAGNOSIS — R635 Abnormal weight gain: Secondary | ICD-10-CM

## 2013-04-03 DIAGNOSIS — R109 Unspecified abdominal pain: Secondary | ICD-10-CM

## 2013-04-03 DIAGNOSIS — Z13220 Encounter for screening for lipoid disorders: Secondary | ICD-10-CM

## 2013-04-03 DIAGNOSIS — E669 Obesity, unspecified: Secondary | ICD-10-CM

## 2013-04-03 DIAGNOSIS — Z125 Encounter for screening for malignant neoplasm of prostate: Secondary | ICD-10-CM

## 2013-04-03 LAB — POCT UA - MICROSCOPIC ONLY
Bacteria, U Microscopic: NEGATIVE
Casts, Ur, LPF, POC: NEGATIVE
Crystals, Ur, HPF, POC: NEGATIVE
Mucus, UA: NEGATIVE
RBC, urine, microscopic: NEGATIVE
WBC, Ur, HPF, POC: NEGATIVE
Yeast, UA: NEGATIVE

## 2013-04-03 LAB — POCT URINALYSIS DIPSTICK
Bilirubin, UA: NEGATIVE
Blood, UA: NEGATIVE
Glucose, UA: NEGATIVE
Ketones, UA: NEGATIVE
Leukocytes, UA: NEGATIVE
Nitrite, UA: NEGATIVE
Protein, UA: NEGATIVE
Spec Grav, UA: 1.02
Urobilinogen, UA: NEGATIVE
pH, UA: 6.5

## 2013-04-03 NOTE — Progress Notes (Signed)
Patient ID: Derek Spitler., male   DOB: 1946/06/24, 67 y.o.   MRN: [id number] SUBJECTIVE: CC: Chief Complaint  Patient presents with  . Follow-up    2 wk follow up  flank pain bilateral more on right side    HPI: Here to follow up on urethritis and epididymitis. Symptoms  Resolved. Gets a low back flank pain when he bends or twists. Losing weight by dieting. Had coloscopy and immunizations last year covered by the [facility] clinic/hospital. No other problems. No history of fever blisters or HSV infections. His girlfriend and he may break up due to difference in maturity level. Otherwise he is making healthier lifestyle changes in his meals and physical activities.   Past Medical History  Diagnosis Date  . GERD (gastroesophageal reflux disease)   . Urethritis     when he was in [employer]   No past surgical history on file. History   Social History  . Marital Status: Married    Spouse Name: N/A    Number of Children: N/A  . Years of Education: N/A   Occupational History  . Not on file.   Social History Main Topics  . Smoking status: Never Smoker   . Smokeless tobacco: Not on file  . Alcohol Use: No  . Drug Use: No  . Sexual Activity:    Other Topics Concern  . Not on file   Social History Narrative  . No narrative on file   No family history on file. Current Outpatient Prescriptions on File Prior to Visit  Medication Sig Dispense Refill  . doxycycline (VIBRA-TABS) 100 MG tablet Take 1 tablet (100 mg total) by mouth 2 (two) times daily.  20 tablet  0  . omeprazole (PRILOSEC) 20 MG capsule Take 20 mg by mouth daily.       No current facility-administered medications on file prior to visit.   No Known Allergies Immunization History  Administered Date(s) Administered  . Tdap 09/29/2011   Prior to Admission medications   Medication Sig Start Date End Date Taking? Authorizing Provider  doxycycline (VIBRA-TABS) 100 MG tablet Take 1 tablet (100 mg total) by mouth 2 (two)  times daily. 03/20/13  Yes Ileana Ladd, MD  omeprazole (PRILOSEC) 20 MG capsule Take 20 mg by mouth daily.   Yes Historical Provider, MD     ROS: As above in the HPI. All other systems are stable or negative.  OBJECTIVE: APPEARANCE:  Patient in no acute distress.The patient appeared well nourished and normally developed. Acyanotic. Waist: VITAL SIGNS:BP 109/70  Pulse 60  Temp(Src) 97.4 F (36.3 C) (Oral)  Wt 269 lb 3.2 oz (122.108 kg)  BMI 42.8 kg/m2 AAM Obese  SKIN: warm and  Dry without overt rashes, tattoos and scars  HEAD and Neck: without JVD, Head and scalp: normal Eyes:No scleral icterus. Fundi normal, eye movements normal. Ears: Auricle normal, canal normal, Tympanic membranes normal, insufflation normal. Nose: normal Throat: normal Neck & thyroid: normal  CHEST & LUNGS: Chest wall: normal Lungs: Clear  CVS: Reveals the PMI to be normally located. Regular rhythm, First and Second Heart sounds are normal,  absence of murmurs, rubs or gallops. Peripheral vasculature: Radial pulses: normal Dorsal pedis pulses: normal Posterior pulses: normal  ABDOMEN:  Appearance: Obese Benign, no organomegaly, no masses, no Abdominal Aortic enlargement. No Guarding , no rebound. No Bruits. Bowel sounds: normal  RECTAL: N/A GU: N/A  EXTREMETIES: nonedematous.  MUSCULOSKELETAL:  Spine: normal Joints: intact  NEUROLOGIC: oriented to time,place and person; nonfocal.  Strength is normal Sensory is normal Reflexes are normal Cranial Nerves are normal.  ASSESSMENT: Flank pain - Plan: POCT UA - Microscopic Only, POCT urinalysis dipstick, CMP14+EGFR  Screening cholesterol level - Plan: Lipid panel  Screening for prostate cancer - Plan: PSA, total and free  Obesity, unspecified  Urethritis  GERD (gastroesophageal reflux disease)   PLAN: Orders Placed This Encounter  Procedures  . Lipid panel  . CMP14+EGFR  . PSA, total and free  . POCT UA - Microscopic  Only  . POCT urinalysis dipstick    No orders of the defined types were placed in this encounter.    Results for orders placed in visit on 04/03/13  POCT UA - MICROSCOPIC ONLY      Result Value Range   WBC, Ur, HPF, POC neg     RBC, urine, microscopic neg     Bacteria, U Microscopic neg     Mucus, UA neg     Epithelial cells, urine per micros occ     Crystals, Ur, HPF, POC neg     Casts, Ur, LPF, POC neg     Yeast, UA neg    POCT URINALYSIS DIPSTICK      Result Value Range   Color, UA yellow     Clarity, UA clear     Glucose, UA neg     Bilirubin, UA neg     Ketones, UA neg     Spec Grav, UA 1.020     Blood, UA neg     pH, UA 6.5     Protein, UA neg     Urobilinogen, UA negative     Nitrite, UA neg     Leukocytes, UA Negative     reviewed his STD screens.  Supportive counseling, discussed life choices and Lifestyle therapeutic  Changes.  Return in about 6 months (around 10/04/2013).  Derek [name] P. [place] Charon, M.D.

## 2013-04-13 NOTE — Progress Notes (Signed)
Quick Note:  Labs abnormal. Labs are not back yet and it is 10 days???? ______

## 2013-04-16 ENCOUNTER — Other Ambulatory Visit: Payer: Self-pay | Admitting: Family Medicine

## 2013-04-19 NOTE — Telephone Encounter (Signed)
Last seen 04/03/13  FPW

## 2013-04-20 NOTE — Telephone Encounter (Signed)
Patient needs to be seen. Has exceeded time since last visit. Needs to bring all medications to next appointment. If symptoms  Comes back or persists needs to be seen.

## 2013-04-24 ENCOUNTER — Other Ambulatory Visit (INDEPENDENT_AMBULATORY_CARE_PROVIDER_SITE_OTHER): Payer: Medicare Other

## 2013-04-24 DIAGNOSIS — R109 Unspecified abdominal pain: Secondary | ICD-10-CM

## 2013-04-24 DIAGNOSIS — Z125 Encounter for screening for malignant neoplasm of prostate: Secondary | ICD-10-CM

## 2013-04-24 DIAGNOSIS — Z13220 Encounter for screening for lipoid disorders: Secondary | ICD-10-CM

## 2013-04-24 NOTE — Progress Notes (Signed)
Patient here today for labs only. °

## 2013-04-25 LAB — LIPID PANEL
Chol/HDL Ratio: 4 ratio units (ref 0.0–5.0)
Cholesterol, Total: 175 mg/dL (ref 100–199)
HDL: 44 mg/dL (ref >39)
LDL Calculated: 114 mg/dL — ABNORMAL HIGH (ref 0–99)
Triglycerides: 84 mg/dL (ref 0–149)
VLDL Cholesterol Cal: 17 mg/dL (ref 5–40)

## 2013-04-25 LAB — CMP14+EGFR
ALT: 15 IU/L (ref 0–44)
AST: 15 IU/L (ref 0–40)
Albumin/Globulin Ratio: 1.1 (ref 1.1–2.5)
Albumin: 4 g/dL (ref 3.6–4.8)
Alkaline Phosphatase: 71 IU/L (ref 39–117)
BUN/Creatinine Ratio: 10 (ref 10–22)
BUN: 9 mg/dL (ref 8–27)
CO2: 26 mmol/L (ref 18–29)
Calcium: 9.6 mg/dL (ref 8.6–10.2)
Chloride: 99 mmol/L (ref 97–108)
Creatinine, Ser: 0.89 mg/dL (ref 0.76–1.27)
GFR calc Af Amer: 104 mL/min/{1.73_m2} (ref >59)
GFR calc non Af Amer: 90 mL/min/{1.73_m2} (ref >59)
Globulin, Total: 3.6 g/dL (ref 1.5–4.5)
Glucose: 103 mg/dL — ABNORMAL HIGH (ref 65–99)
Potassium: 4.2 mmol/L (ref 3.5–5.2)
Sodium: 139 mmol/L (ref 134–144)
Total Bilirubin: 0.3 mg/dL (ref 0.0–1.2)
Total Protein: 7.6 g/dL (ref 6.0–8.5)

## 2013-04-25 LAB — PSA, TOTAL AND FREE
PSA, Free Pct: 23.3 %
PSA, Free: 0.14 ng/mL
PSA: 0.6 ng/mL (ref 0.0–4.0)

## 2013-08-31 ENCOUNTER — Ambulatory Visit: Payer: Commercial Managed Care - HMO

## 2013-09-01 ENCOUNTER — Ambulatory Visit (INDEPENDENT_AMBULATORY_CARE_PROVIDER_SITE_OTHER): Payer: Commercial Managed Care - HMO | Admitting: Emergency Medicine

## 2013-09-01 VITALS — BP 116/74 | HR 78 | Temp 98.7°F | Resp 18 | Ht 66.0 in | Wt 273.6 lb

## 2013-09-01 DIAGNOSIS — R300 Dysuria: Secondary | ICD-10-CM

## 2013-09-01 DIAGNOSIS — Z202 Contact with and (suspected) exposure to infections with a predominantly sexual mode of transmission: Secondary | ICD-10-CM

## 2013-09-01 LAB — POCT URINALYSIS DIPSTICK
Bilirubin, UA: NEGATIVE
Blood, UA: NEGATIVE
Glucose, UA: NEGATIVE
Ketones, UA: NEGATIVE
Leukocytes, UA: NEGATIVE
Nitrite, UA: NEGATIVE
Protein, UA: NEGATIVE
Spec Grav, UA: 1.02
Urobilinogen, UA: 0.2
pH, UA: 6

## 2013-09-01 LAB — POCT UA - MICROSCOPIC ONLY
Casts, Ur, LPF, POC: NEGATIVE
Crystals, Ur, HPF, POC: NEGATIVE
Mucus, UA: POSITIVE
Yeast, UA: NEGATIVE

## 2013-09-01 LAB — RPR

## 2013-09-01 MED ORDER — DOXYCYCLINE HYCLATE 100 MG PO CAPS: 100.0000 mg | ORAL_CAPSULE | Freq: Two times a day (BID) | ORAL | Status: DC

## 2013-09-01 MED ORDER — CEFTRIAXONE SODIUM 1 G IJ SOLR
250.0000 mg | INTRAMUSCULAR | Status: DC
  Administered 2013-09-01: 250 mg via INTRAMUSCULAR

## 2013-09-01 NOTE — Patient Instructions (Signed)
Safe Sex Safe sex is about reducing the risk of giving or getting a sexually transmitted disease (STD). STDs are spread through sexual contact involving the genitals, mouth, or rectum. Some STDS can be cured and others cannot. Safe sex can also prevent unintended pregnancies.  SAFE SEX PRACTICES  Limit your sexual activity to only one partner who is only having sex with you.  Talk to your partner about their past partners, past STDs, and drug use.  Use a condom every time you have sexual intercourse. This includes vaginal, oral, and anal sexual activity. Both females and males should wear condoms during oral sex. Only use latex or polyurethane condoms and water-based lubricants. Petroleum-based lubricants or oils used to lubricate a condom will weaken the condom and increase the chance that it will break. The condom should be in place from the beginning to the end of sexual activity. Wearing a condom reduces, but does not completely eliminate, your risk of getting or giving a STD. STDs can be spread by contact with skin of surrounding areas.  Get vaccinated for hepatitis B and HPV.  Avoid alcohol and recreational drugs which can affect your judgement. You may forget to use a condom or participate in high-risk sex.  For females, avoid douching after sexual intercourse. Douching can spread an infection farther into the reproductive tract.  Check your body for signs of sores, blisters, rashes, or unusual discharge. See your caregiver if you notice any of these signs.  Avoid sexual contact if you have symptoms of an infection or are being treated for an STD. If you or your partner has herpes, avoid sexual contact when blisters are present. Use condoms at all other times.  See your caregiver for regular screenings, examinations, and tests for STDs. Before having sex with a new partner, each of you should be screened for STDs and talk about the results with your partner. BENEFITS OF SAFE SEX   There  is less of a chance of getting or giving an STD.  You can prevent unwanted or unintended pregnancies.  By discussing safer sex concerns with your partner, you may increase feelings of intimacy, comfort, trust, and honesty between the both of you. Document Released: 09/09/2004 Document Revised: 04/26/2012 Document Reviewed: 01/24/2012 ExitCare Patient Information 2014 [employer].  

## 2013-09-01 NOTE — Progress Notes (Signed)
[facility] [address], [place] [id number] [phone]  Date:  09/01/2013   Name:  Derek BusingCurtis Authier Jr.   DOB:  02/28/1946   MRN:  [id number]  PCP:  Redmond BasemanWONG,FRANCIS PATRICK, MD    Chief Complaint: Dysuria   History of Present Illness:  Derek BusingCurtis Jessie Jr. is a 68 y.o. very pleasant male patient who presents with the following:  Had new sexually encounter and has dysuria and urgency.  Denies discharge. No rash  Fever or chills.  Prior history of STD 1 year ago.  History of prostatism treated with surgery.  No improvement with over the counter medications or other home remedies. Denies other complaint or health concern today.   Patient Active Problem List   Diagnosis Date Noted  . GERD (gastroesophageal reflux disease)   . Obesity   . Epididymitis 03/20/2013  . Urethritis 03/20/2013  . Burning with urination 03/20/2013  . Obesity, unspecified 03/20/2013    Past Medical History  Diagnosis Date  . Urethritis     when he was in [employer]  . GERD (gastroesophageal reflux disease)   . Obesity     No past surgical history on file.  History  Substance Use Topics  . Smoking status: Never Smoker   . Smokeless tobacco: Not on file  . Alcohol Use: No    No family history on file.  No Known Allergies  Medication list has been reviewed and updated.  Current Outpatient Prescriptions on File Prior to Visit  Medication Sig Dispense Refill  . omeprazole (PRILOSEC) 20 MG capsule Take 20 mg by mouth daily.      Marland Kitchen. doxycycline (VIBRA-TABS) 100 MG tablet Take 1 tablet (100 mg total) by mouth 2 (two) times daily.  20 tablet  0   No current facility-administered medications on file prior to visit.    Review of Systems:  As per HPI, otherwise negative.    Physical Examination: Filed Vitals:   09/01/13 1439  BP: 116/74  Pulse: 78  Temp: 98.7 F (37.1 C)  Resp: 18   Filed Vitals:   09/01/13 1439  Height: 5\' 6"  (1.676 m)  Weight: 273 lb 9.6 oz (124.104  kg)   Body mass index is 44.18 kg/(m^2). Ideal Body Weight: Weight in (lb) to have BMI = 25: 154.6   GEN: WDWN, NAD, Non-toxic, Alert & Oriented x 3 HEENT: Atraumatic, Normocephalic.  Ears and Nose: No external deformity. EXTR: No clubbing/cyanosis/edema NEURO: Normal gait.  PSYCH: Normally interactive. Conversant. Not depressed or anxious appearing.  Calm demeanor.    Assessment and Plan: STD exposure Rocephin doxycyclin  Signed,  Phillips OdorJeffery Jaella Weinert, MD

## 2013-09-03 LAB — GC/CHLAMYDIA PROBE AMP
CT Probe RNA: NEGATIVE
GC Probe RNA: NEGATIVE

## 2013-10-05 ENCOUNTER — Ambulatory Visit (INDEPENDENT_AMBULATORY_CARE_PROVIDER_SITE_OTHER): Payer: Commercial Managed Care - HMO | Admitting: Family Medicine

## 2013-10-05 ENCOUNTER — Encounter: Payer: Self-pay | Admitting: Family Medicine

## 2013-10-05 VITALS — BP 110/72 | HR 88 | Temp 98.0°F | Ht 66.0 in | Wt 267.2 lb

## 2013-10-05 DIAGNOSIS — R6889 Other general symptoms and signs: Secondary | ICD-10-CM

## 2013-10-05 DIAGNOSIS — N451 Epididymitis: Secondary | ICD-10-CM

## 2013-10-05 DIAGNOSIS — R899 Unspecified abnormal finding in specimens from other organs, systems and tissues: Secondary | ICD-10-CM

## 2013-10-05 DIAGNOSIS — N453 Epididymo-orchitis: Secondary | ICD-10-CM

## 2013-10-05 DIAGNOSIS — E669 Obesity, unspecified: Secondary | ICD-10-CM

## 2013-10-05 LAB — POCT UA - MICROSCOPIC ONLY
Bacteria, U Microscopic: NEGATIVE
Casts, Ur, LPF, POC: NEGATIVE
Crystals, Ur, HPF, POC: NEGATIVE
Epithelial cells, urine per micros: NEGATIVE
Mucus, UA: NEGATIVE
RBC, urine, microscopic: NEGATIVE
WBC, Ur, HPF, POC: NEGATIVE
Yeast, UA: NEGATIVE

## 2013-10-05 LAB — POCT URINALYSIS DIPSTICK
Bilirubin, UA: NEGATIVE
Blood, UA: NEGATIVE
Glucose, UA: NEGATIVE
Ketones, UA: NEGATIVE
Leukocytes, UA: NEGATIVE
Nitrite, UA: NEGATIVE
Protein, UA: NEGATIVE
Spec Grav, UA: 1.01
Urobilinogen, UA: NEGATIVE
pH, UA: 6

## 2013-10-05 LAB — GLUCOSE, POCT (MANUAL RESULT ENTRY): POC Glucose: 91 mg/dl (ref 70–99)

## 2013-10-05 LAB — POCT GLYCOSYLATED HEMOGLOBIN (HGB A1C): Hemoglobin A1C: 4.9

## 2013-10-05 MED ORDER — DOXYCYCLINE HYCLATE 100 MG PO TABS: 100.0000 mg | ORAL_TABLET | Freq: Two times a day (BID) | ORAL | Status: DC

## 2013-10-05 NOTE — Patient Instructions (Signed)
Epididymitis  Epididymitis is a swelling (inflammation) of the epididymis. The epididymis is a cord-like structure along the back part of the testicle. Epididymitis is usually, but not always, caused by infection. This is usually a sudden problem beginning with chills, fever and pain behind the scrotum and in the testicle. There may be swelling and redness of the testicle.  DIAGNOSIS   Physical examination will reveal a tender, swollen epididymis. Sometimes, cultures are obtained from the urine or from prostate secretions to help find out if there is an infection or if the cause is a different problem. Sometimes, blood work is performed to see if your white blood cell count is elevated and if a germ (bacterial) or viral infection is present. Using this knowledge, an appropriate medicine which kills germs (antibiotic) can be chosen by your caregiver. A viral infection causing epididymitis will most often go away (resolve) without treatment.  HOME CARE INSTRUCTIONS   · Hot sitz baths for 20 minutes, 4 times per day, may help relieve pain.  · Only take over-the-counter or prescription medicines for pain, discomfort or fever as directed by your caregiver.  · Take all medicines, including antibiotics, as directed. Take the antibiotics for the full prescribed length of time even if you are feeling better.  · It is very important to keep all follow-up appointments.  SEEK IMMEDIATE MEDICAL CARE IF:   · You have a fever.  · You have pain not relieved with medicines.  · You have any worsening of your problems.  · Your pain seems to come and go.  · You develop pain, redness, and swelling in the scrotum and surrounding areas.  MAKE SURE YOU:   · Understand these instructions.  · Will watch your condition.  · Will get help right away if you are not doing well or get worse.  Document Released: 07/30/2000 Document Revised: 10/25/2011 Document Reviewed: 06/19/2009  ExitCare® Patient Information ©2014 [employer].

## 2013-10-05 NOTE — Progress Notes (Signed)
Patient ID: Derek BusingCurtis Kreuser Jr., male   DOB: 07/12/1946, 68 y.o.   MRN: [id number] SUBJECTIVE: CC: Chief Complaint  Patient presents with  . Follow-up    6 month follow up c/o "little lower back pain and rt groin pain. "feels something around area"    HPI: Still with a younger woman many years his junior. Sexually active. Has a pain in the right scrotal area again. Denies any penile discharge.  Past Medical History  Diagnosis Date  . Urethritis     when he was in [employer]  . GERD (gastroesophageal reflux disease)   . Obesity    No past surgical history on file. History   Social History  . Marital Status: Married    Spouse Name: N/A    Number of Children: N/A  . Years of Education: N/A   Occupational History  . Not on file.   Social History Main Topics  . Smoking status: Never Smoker   . Smokeless tobacco: Not on file  . Alcohol Use: No  . Drug Use: No  . Sexual Activity:    Other Topics Concern  . Not on file   Social History Narrative  . No narrative on file   No family history on file. Current Outpatient Prescriptions on File Prior to Visit  Medication Sig Dispense Refill  . omeprazole (PRILOSEC) 20 MG capsule Take 20 mg by mouth daily.       Current Facility-Administered Medications on File Prior to Visit  Medication Dose Route Frequency Provider Last Rate Last Dose  . cefTRIAXone (ROCEPHIN) injection 250 mg  250 mg Intramuscular Q24H Phillips OdorJeffery Anderson, MD   250 mg at 09/01/13 1514   No Known Allergies Immunization History  Administered Date(s) Administered  . Tdap 09/29/2011   Prior to Admission medications   Medication Sig Start Date End Date Taking? Authorizing Provider  doxycycline (VIBRA-TABS) 100 MG tablet Take 1 tablet (100 mg total) by mouth 2 (two) times daily. 03/20/13   Ileana LaddFrancis P Terre Hanneman, MD  doxycycline (VIBRAMYCIN) 100 MG capsule Take 1 capsule (100 mg total) by mouth 2 (two) times daily. 09/01/13   Phillips OdorJeffery Anderson, MD  omeprazole (PRILOSEC) 20 MG  capsule Take 20 mg by mouth daily.    Historical Provider, MD     ROS: As above in the HPI. All other systems are stable or negative.  OBJECTIVE: APPEARANCE:  Patient in no acute distress.The patient appeared well nourished and normally developed. Acyanotic. Waist: VITAL SIGNS:BP 110/72  Pulse 88  Temp(Src) 98 F (36.7 C) (Oral)  Ht 5\' 6"  (1.676 m)  Wt 267 lb 3.2 oz (121.201 kg)  BMI 43.15 kg/m2 Obese AAM   SKIN: warm and  Dry without overt rashes, tattoos and scars  HEAD and Neck: without JVD, Head and scalp: normal Eyes:No scleral icterus. Fundi normal, eye movements normal. Ears: Auricle normal, canal normal, Tympanic membranes normal, insufflation normal. Nose: normal Throat: normal Neck & thyroid: normal  CHEST & LUNGS: Chest wall: normal Lungs: Clear  CVS: Reveals the PMI to be normally located. Regular rhythm, First and Second Heart sounds are normal,  absence of murmurs, rubs or gallops. Peripheral vasculature: Radial pulses: normal Dorsal pedis pulses: normal Posterior pulses: normal  ABDOMEN:  Appearance: Obese Benign, no organomegaly, no masses, no Abdominal Aortic enlargement. No Guarding , no rebound. No Bruits. Bowel sounds: normal  RECTAL: N/A GU: tender right epididymis. Testes normal. No hernias., no lymphadenopathy.  EXTREMETIES: nonedematous.  MUSCULOSKELETAL:  Spine: normal Joints: intact  NEUROLOGIC: oriented to  time,place and person; nonfocal. Strength is normal Sensory is normal Reflexes are normal Cranial Nerves are normal.  ASSESSMENT: Epididymitis - Plan: GC/Chlamydia Probe Amp, Urine culture, POCT urinalysis dipstick, POCT UA - Microscopic Only, RPR, doxycycline (VIBRA-TABS) 100 MG tablet  Obesity, unspecified  Abnormal laboratory test - Plan: Lipid panel, POCT glycosylated hemoglobin (Hb A1C), POCT glucose (manual entry)  PLAN: Safe sex discussed. Await labs. Discussed risks in his relationship.   Orders Placed  This Encounter  Procedures  . GC/Chlamydia Probe Amp  . Urine culture  . RPR  . Lipid panel  . POCT urinalysis dipstick  . POCT UA - Microscopic Only  . POCT glycosylated hemoglobin (Hb A1C)  . POCT glucose (manual entry)   Meds ordered this encounter  Medications  . Ascorbic Acid (VITAMIN C) 1000 MG tablet    Sig: Take 1,000 mg by mouth daily.  . Vitamin D, Ergocalciferol, (DRISDOL) 50000 UNITS CAPS capsule    Sig: Take 50,000 Units by mouth every 7 (seven) days.  Marland Kitchen doxycycline (VIBRA-TABS) 100 MG tablet    Sig: Take 1 tablet (100 mg total) by mouth 2 (two) times daily.    Dispense:  20 tablet    Refill:  0   Medications Discontinued During This Encounter  Medication Reason  . doxycycline (VIBRA-TABS) 100 MG tablet Completed Course  . doxycycline (VIBRAMYCIN) 100 MG capsule Completed Course   Return in about 2 weeks (around 10/19/2013) for Recheck medical problems.  Shyah Cadmus P. Modesto Charon, M.D.

## 2013-10-06 LAB — RPR: RPR: NONREACTIVE

## 2013-10-06 LAB — URINE CULTURE

## 2013-10-06 LAB — LIPID PANEL
Chol/HDL Ratio: 4.3 ratio units (ref 0.0–5.0)
Cholesterol, Total: 186 mg/dL (ref 100–199)
HDL: 43 mg/dL (ref >39)
LDL Calculated: 129 mg/dL — ABNORMAL HIGH (ref 0–99)
Triglycerides: 72 mg/dL (ref 0–149)
VLDL Cholesterol Cal: 14 mg/dL (ref 5–40)

## 2013-10-10 LAB — GC/CHLAMYDIA PROBE AMP
Chlamydia trachomatis, NAA: NEGATIVE
Neisseria gonorrhoeae by PCR: NEGATIVE

## 2013-10-11 NOTE — Progress Notes (Signed)
Quick Note:  Call Patient Labs that are abnormal: LDLc was not at goal  The rest are at goal or negative for obvious infection.  Recommendations:  Need to diet , exercise and lose weight, this will lower the LDLc.   ______

## 2013-10-18 ENCOUNTER — Ambulatory Visit: Payer: Commercial Managed Care - HMO | Admitting: Family Medicine

## 2013-10-18 ENCOUNTER — Ambulatory Visit (INDEPENDENT_AMBULATORY_CARE_PROVIDER_SITE_OTHER): Payer: Commercial Managed Care - HMO | Admitting: Family Medicine

## 2013-10-18 ENCOUNTER — Encounter: Payer: Self-pay | Admitting: Family Medicine

## 2013-10-18 VITALS — BP 123/70 | HR 80 | Temp 97.4°F | Ht 66.0 in | Wt 262.6 lb

## 2013-10-18 DIAGNOSIS — K219 Gastro-esophageal reflux disease without esophagitis: Secondary | ICD-10-CM

## 2013-10-18 DIAGNOSIS — R300 Dysuria: Secondary | ICD-10-CM

## 2013-10-18 DIAGNOSIS — N342 Other urethritis: Secondary | ICD-10-CM

## 2013-10-18 DIAGNOSIS — E669 Obesity, unspecified: Secondary | ICD-10-CM

## 2013-10-18 DIAGNOSIS — N451 Epididymitis: Secondary | ICD-10-CM

## 2013-10-18 DIAGNOSIS — N453 Epididymo-orchitis: Secondary | ICD-10-CM

## 2013-10-18 NOTE — Progress Notes (Signed)
Patient ID: Derek [name]., male   DOB: 11-01-1945, 68 y.o.   MRN: [id number] SUBJECTIVE: CC: Chief Complaint  Patient presents with  . Follow-up    prostate?    HPI:  Here for follow up of epididymitis. Symptoms resolved. Obesity: making some changes with his lifestyle and lost some weight..  In regards to his relationship with a much youger woman. He is reassess that relationship, because he is realizing that it is not a very constructive  Relationship. GERD: stable.   Past Medical History  Diagnosis Date  . Urethritis     when he was in [employer]  . GERD (gastroesophageal reflux disease)   . Obesity    History reviewed. No pertinent past surgical history. History   Social History  . Marital Status: Married    Spouse Name: N/A    Number of Children: N/A  . Years of Education: N/A   Occupational History  . Not on file.   Social History Main Topics  . Smoking status: Never Smoker   . Smokeless tobacco: Not on file  . Alcohol Use: No  . Drug Use: No  . Sexual Activity: Not on file   Other Topics Concern  . Not on file   Social History Narrative  . No narrative on file   Family History  Problem Relation Age of Onset  . Cancer Mother     breast  . Diabetes Father    Current Outpatient Prescriptions on File Prior to Visit  Medication Sig Dispense Refill  . Ascorbic Acid (VITAMIN C) 1000 MG tablet Take 1,000 mg by mouth daily.      Marland Kitchen doxycycline (VIBRA-TABS) 100 MG tablet Take 1 tablet (100 mg total) by mouth 2 (two) times daily.  20 tablet  0  . omeprazole (PRILOSEC) 20 MG capsule Take 20 mg by mouth daily.      . Vitamin D, Ergocalciferol, (DRISDOL) 50000 UNITS CAPS capsule Take 50,000 Units by mouth every 7 (seven) days.       Current Facility-Administered Medications on File Prior to Visit  Medication Dose Route Frequency Provider Last Rate Last Dose  . cefTRIAXone (ROCEPHIN) injection 250 mg  250 mg Intramuscular Q24H Phillips Odor, MD   250 mg at  09/01/13 1514   No Known Allergies Immunization History  Administered Date(s) Administered  . Tdap 09/29/2011   Prior to Admission medications   Medication Sig Start Date End Date Taking? Authorizing Provider  Ascorbic Acid (VITAMIN C) 1000 MG tablet Take 1,000 mg by mouth daily.   Yes Historical Provider, MD  doxycycline (VIBRA-TABS) 100 MG tablet Take 1 tablet (100 mg total) by mouth 2 (two) times daily. 10/05/13  Yes Ileana Ladd, MD  omeprazole (PRILOSEC) 20 MG capsule Take 20 mg by mouth daily.   Yes Historical Provider, MD  Vitamin D, Ergocalciferol, (DRISDOL) 50000 UNITS CAPS capsule Take 50,000 Units by mouth every 7 (seven) days.    Historical Provider, MD     ROS: As above in the HPI. All other systems are stable or negative.  OBJECTIVE: APPEARANCE:  Patient in no acute distress.The patient appeared well nourished and normally developed. Acyanotic. Waist: VITAL SIGNS:BP 123/70  Pulse 80  Temp(Src) 97.4 F (36.3 C) (Oral)  Ht 5\' 6"  (1.676 m)  Wt 262 lb 9.6 oz (119.115 kg)  BMI 42.41 kg/m2   SKIN: warm and  Dry without overt rashes, tattoos and scars  HEAD and Neck: without JVD, Head and scalp: normal Eyes:No scleral icterus. Fundi normal,  eye movements normal. Ears: Auricle normal, canal normal, Tympanic membranes normal, insufflation normal. Nose: normal Throat: normal Neck & thyroid: normal  CHEST & LUNGS: Chest wall: normal Lungs: Clear  CVS: Reveals the PMI to be normally located. Regular rhythm, First and Second Heart sounds are normal,  absence of murmurs, rubs or gallops. Peripheral vasculature: Radial pulses: normal Dorsal pedis pulses: normal Posterior pulses: normal  ABDOMEN:  Appearance: normal Benign, no organomegaly, no masses, no Abdominal Aortic enlargement. No Guarding , no rebound. No Bruits. Bowel sounds: normal  RECTAL: N/A GU: N/A  EXTREMETIES: nonedematous.  MUSCULOSKELETAL:  Spine: normal Joints:  intact  NEUROLOGIC: oriented to time,place and person; nonfocal. Strength is normal Sensory is normal Reflexes are normal Cranial Nerves are normal.  Results for orders placed in visit on 10/05/13  GC/CHLAMYDIA PROBE AMP      Result Value Ref Range   Chlamydia trachomatis, NAA Negative  Negative   Neisseria gonorrhoeae by PCR Negative  Negative   PLEASE NOTE: Comment    URINE CULTURE      Result Value Ref Range   Urine Culture, Routine Final report     Result 1 Comment    RPR      Result Value Ref Range   RPR Non Reactive  Non Reactive  LIPID PANEL      Result Value Ref Range   Cholesterol, Total 186  100 - 199 mg/dL   Triglycerides 72  0 - 149 mg/dL   HDL 43  >44 mg/dL   VLDL Cholesterol Cal 14  5 - 40 mg/dL   LDL Calculated 034 (*) 0 - 99 mg/dL   Chol/HDL Ratio 4.3  0.0 - 5.0 ratio units  POCT URINALYSIS DIPSTICK      Result Value Ref Range   Color, UA gold     Clarity, UA CLEAR     Glucose, UA NEGATIVE     Bilirubin, UA NEGATIVE     Ketones, UA NEGATIVE     Spec Grav, UA 1.010     Blood, UA NEGATIVE     pH, UA 6.0     Protein, UA NEGATIVE     Urobilinogen, UA negative     Nitrite, UA NEGATIVE     Leukocytes, UA Negative    POCT UA - MICROSCOPIC ONLY      Result Value Ref Range   WBC, Ur, HPF, POC negative     RBC, urine, microscopic negative     Bacteria, U Microscopic negative     Mucus, UA negative     Epithelial cells, urine per micros negative     Crystals, Ur, HPF, POC negative     Casts, Ur, LPF, POC negative     Yeast, UA negative    POCT GLYCOSYLATED HEMOGLOBIN (HGB A1C)      Result Value Ref Range   Hemoglobin A1C 4.9 %    GLUCOSE, POCT (MANUAL RESULT ENTRY)      Result Value Ref Range   POC Glucose 91  70 - 99 mg/dl    ASSESSMENT: Urethritis  Obesity, unspecified  Epididymitis  Burning with urination  GERD (gastroesophageal reflux disease)  PLAN:  counselled on healthy lifestyle. Reviewed his labs. Reviewed his diet and need to  change his diet and exercise and weight loss.  Book: "What Happy People Know" by ------ Manson Passey.  No orders of the defined types were placed in this encounter.   No orders of the defined types were placed in this encounter.   There  are no discontinued medications. Return in about 4 months (around 02/17/2014) for recheck BP and weight.  Lugene Beougher P. Modesto CharonWong, M.D.

## 2013-10-18 NOTE — Patient Instructions (Signed)
Book: "What Happy People Know" by ------ Manson PasseyBrown.

## 2014-04-19 ENCOUNTER — Emergency Department (HOSPITAL_COMMUNITY): Payer: Medicare HMO

## 2014-04-19 ENCOUNTER — Emergency Department (HOSPITAL_COMMUNITY)
Admission: EM | Admit: 2014-04-19 | Discharge: 2014-04-19 | Disposition: A | Discharge status: Home / Self Care | Payer: Medicare HMO | Attending: Emergency Medicine | Admitting: Emergency Medicine

## 2014-04-19 ENCOUNTER — Encounter (HOSPITAL_COMMUNITY): Payer: Self-pay | Admitting: Emergency Medicine

## 2014-04-19 DIAGNOSIS — R109 Unspecified abdominal pain: Secondary | ICD-10-CM | POA: Insufficient documentation

## 2014-04-19 DIAGNOSIS — N3000 Acute cystitis without hematuria: Secondary | ICD-10-CM | POA: Diagnosis not present

## 2014-04-19 DIAGNOSIS — E669 Obesity, unspecified: Secondary | ICD-10-CM | POA: Diagnosis not present

## 2014-04-19 DIAGNOSIS — Z79899 Other long term (current) drug therapy: Secondary | ICD-10-CM | POA: Diagnosis not present

## 2014-04-19 DIAGNOSIS — N453 Epididymo-orchitis: Secondary | ICD-10-CM | POA: Insufficient documentation

## 2014-04-19 DIAGNOSIS — N451 Epididymitis: Secondary | ICD-10-CM

## 2014-04-19 DIAGNOSIS — K219 Gastro-esophageal reflux disease without esophagitis: Secondary | ICD-10-CM | POA: Diagnosis not present

## 2014-04-19 LAB — URINALYSIS, ROUTINE W REFLEX MICROSCOPIC
Bilirubin Urine: NEGATIVE
Glucose, UA: NEGATIVE mg/dL
Hgb urine dipstick: NEGATIVE
Ketones, ur: NEGATIVE mg/dL
Nitrite: NEGATIVE
Protein, ur: NEGATIVE mg/dL
Specific Gravity, Urine: 1.023 (ref 1.005–1.030)
Urobilinogen, UA: 0.2 mg/dL (ref 0.0–1.0)
pH: 5.5 (ref 5.0–8.0)

## 2014-04-19 LAB — URINE MICROSCOPIC-ADD ON

## 2014-04-19 IMAGING — US US ART/VEN ABD/PELV/SCROTUM DOPPLER LTD
1 series · 14 of 25 positions shown · non-contrast
Comparison: None.

CLINICAL DATA: Right-sided groin pain

EXAM:
ULTRASOUND OF SCROTUM
TECHNIQUE: Complete ultrasound examination of the testicles, epididymis, and
other scrotal structures was performed.

[Series 1: us art/ven abd/pelv/scrotum doppler ltd · 0.06mm/px · 14 of 47 slices shown]
[im 1/47]
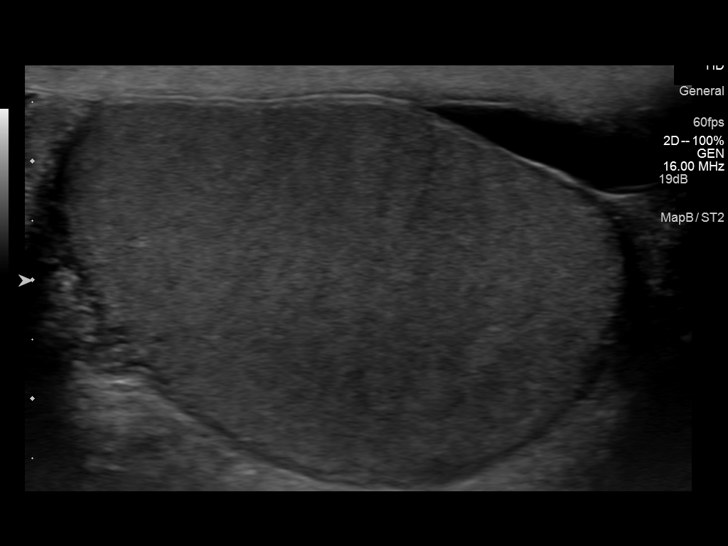
[im 4/47]
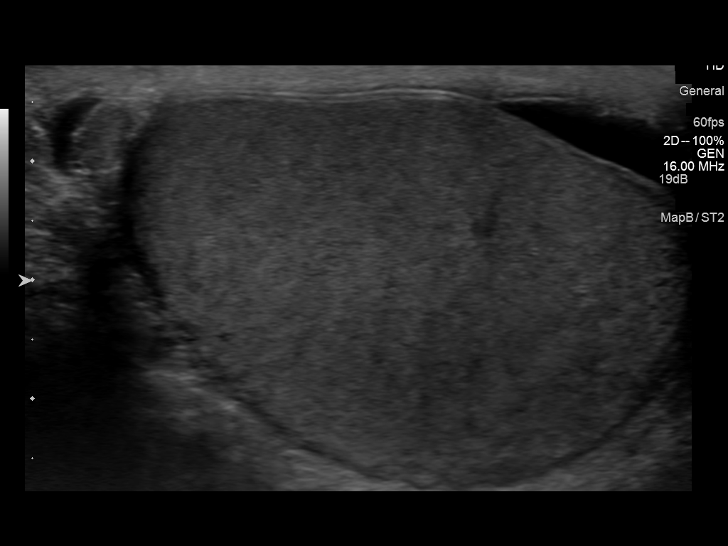
[im 8/47]
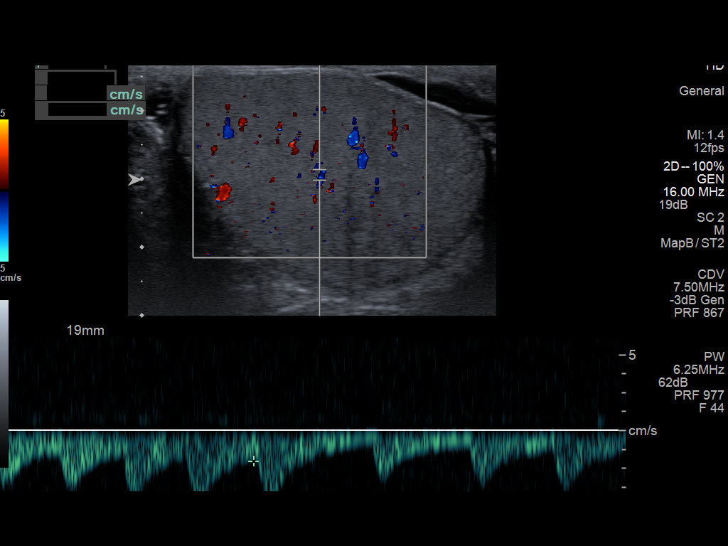
[im 12/47]
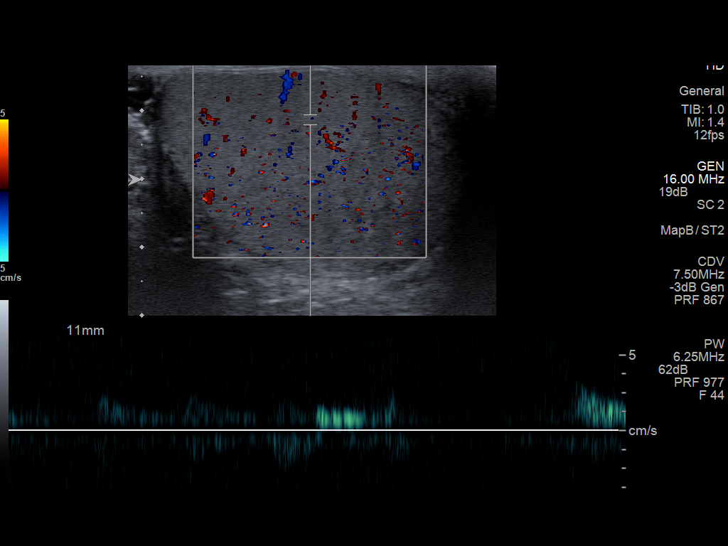
[im 16/47]
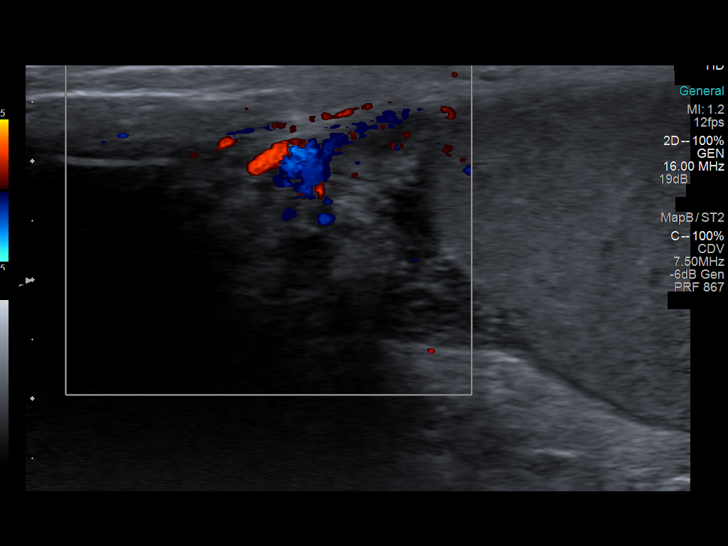
[im 18/47]
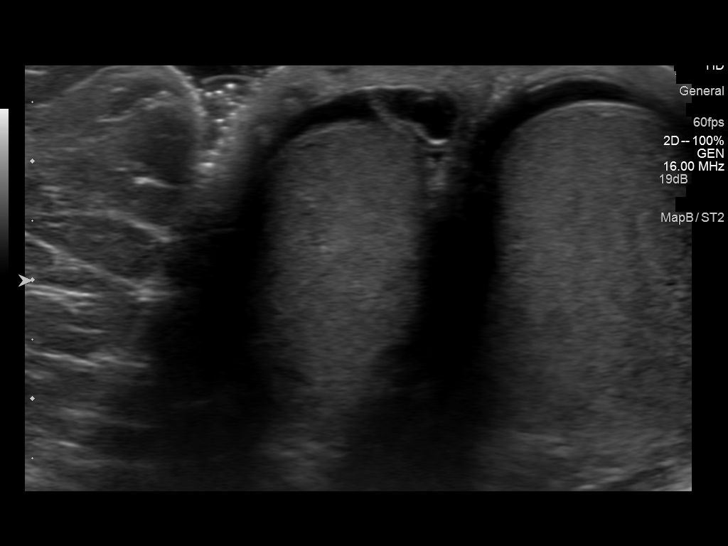
[im 22/47]
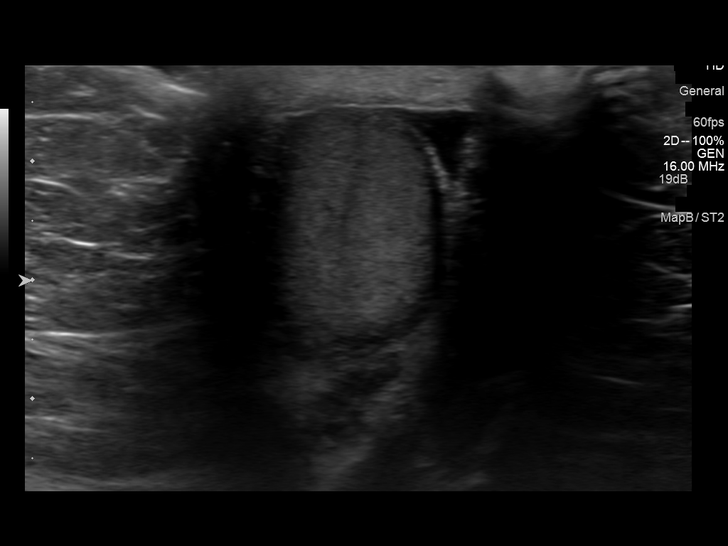
[im 25/47]
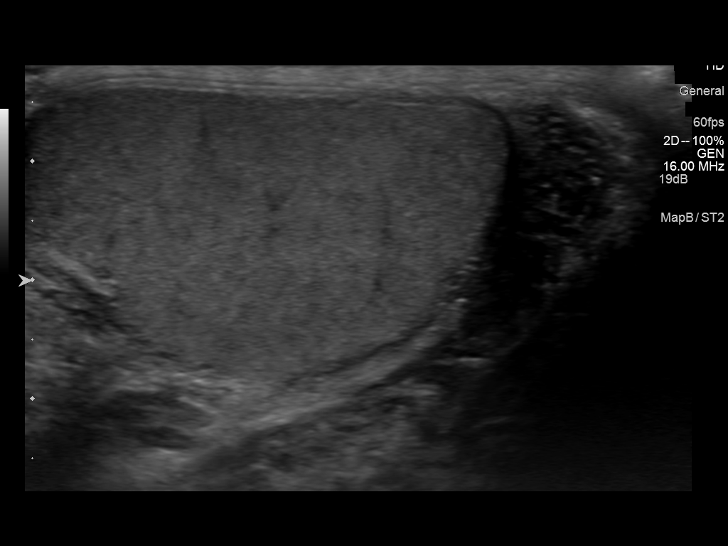
[im 29/47]
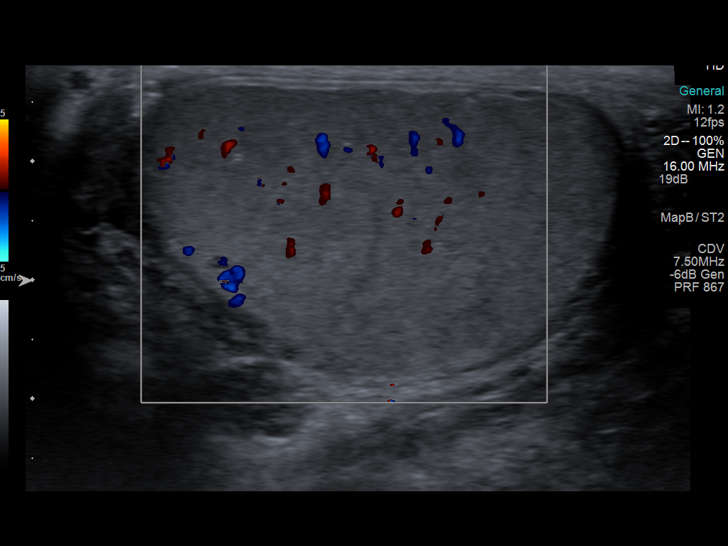
[im 31/47]
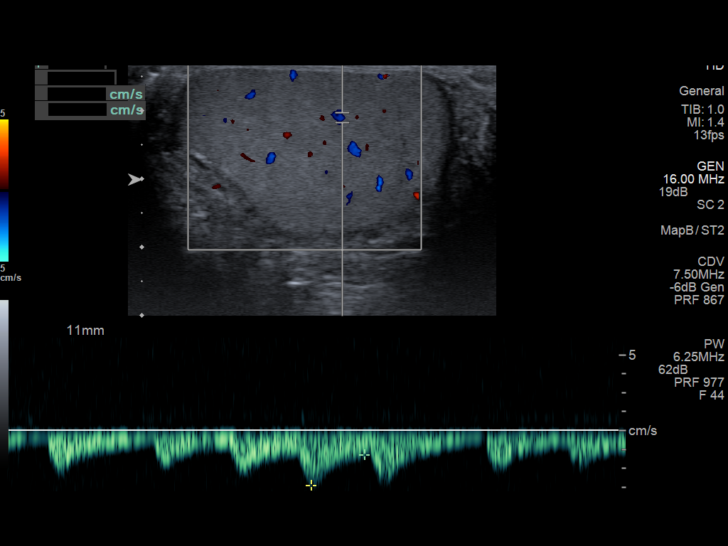
[im 35/47]
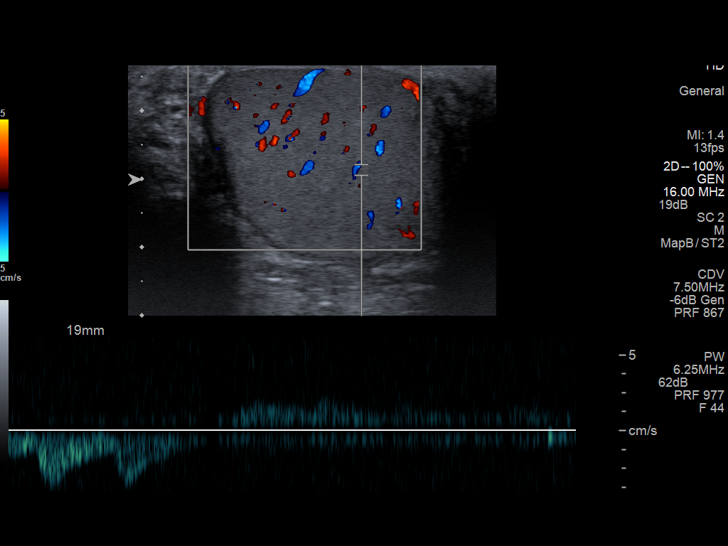
[im 39/47]
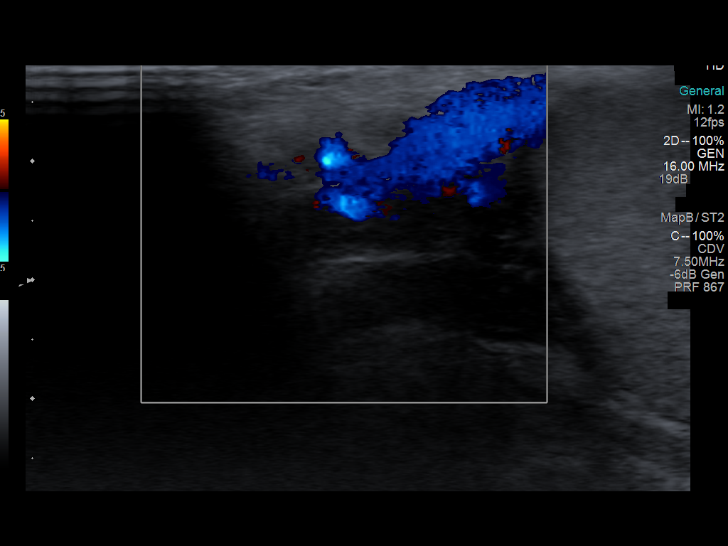
[im 43/47]
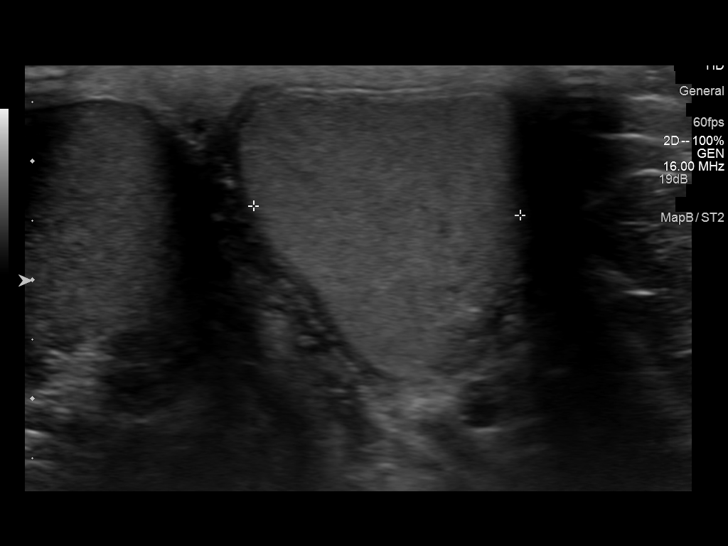
[im 47/47]
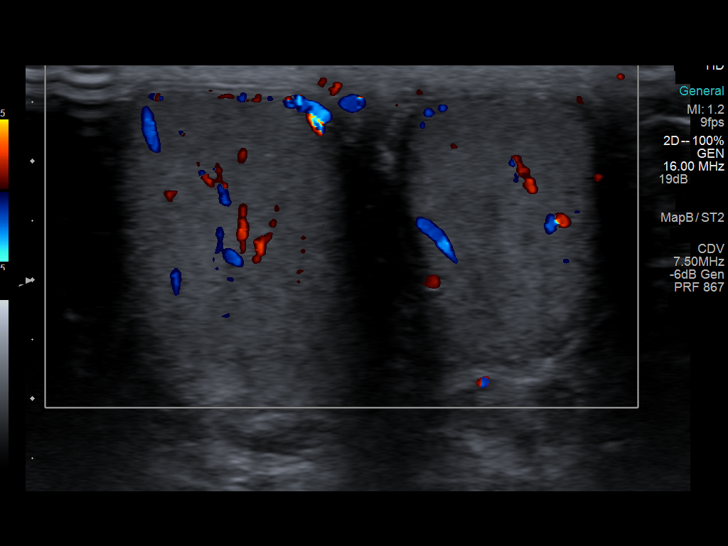

[14 of 25 positions shown; findings below may reference images not displayed]

FINDINGS: Right testicle

Measurements: 4.8 x 3.2 x 2.3 cm. No mass or microlithiasis
visualized.

Left testicle

Measurements: 4 x 2.2 x 2.3 cm. No mass or microlithiasis
visualized.

Right epididymis:  Normal in size and appearance.

Left epididymis:  Normal in size and appearance.

Hydrocele:  None visualized.

Varicocele:  A small left varicocele is visualized.
IMPRESSION: Small left varicocele.  No evidence for testicular mass or torsion.

## 2014-04-19 MED ORDER — CIPROFLOXACIN HCL 500 MG PO TABS: 500.0000 mg | ORAL_TABLET | Freq: Two times a day (BID) | ORAL | Status: DC

## 2014-04-19 MED ORDER — CIPROFLOXACIN HCL 500 MG PO TABS
500.0000 mg | ORAL_TABLET | Freq: Once | ORAL | Status: AC
  Administered 2014-04-19: 500 mg via ORAL
  Filled 2014-04-19: qty 1

## 2014-04-19 NOTE — ED Provider Notes (Signed)
CSN: [id number]     Arrival date & time 04/19/14  1850 History   First MD Initiated Contact with Patient 04/19/14 2101     Chief Complaint  Patient presents with  . Groin Pain     (Consider location/radiation/quality/duration/timing/severity/associated sxs/prior Treatment) HPI Patient reports he has had pain in his right inguinal area the past 2 days. He has some stinging on urination. He denies frequency or feeling that he is not emptying his bladder. He denies fever, nausea, or vomiting. Patient states she's had some prostate problems in the past about 15 years ago when he had urinary retention. Get a foley catheter placed at that time but has had no problems since. He also complains of some stiffness in his hip joints.  PCP Dr Modesto Charon  Past Medical History  Diagnosis Date  . Urethritis     when he was in [employer]  . GERD (gastroesophageal reflux disease)   . Obesity    History reviewed. No pertinent past surgical history. Family History  Problem Relation Age of Onset  . Cancer Mother     breast  . Diabetes Father    History  Substance Use Topics  . Smoking status: Never Smoker   . Smokeless tobacco: Not on file  . 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     Allergies  Review of patient's allergies indicates no known allergies.  Home Medications   Prior to Admission medications   Medication Sig Start Date End Date Taking? Authorizing Provider  Ascorbic Acid (VITAMIN C) 1000 MG tablet Take 1,000 mg by mouth daily.    Historical Provider, MD  omeprazole (PRILOSEC) 20 MG capsule Take 20 mg by mouth daily.    Historical Provider, MD  Vitamin D, Ergocalciferol, (DRISDOL) 50000 UNITS CAPS capsule Take 50,000 Units by mouth every 7 (seven) days.    Historical Provider, MD   BP 108/56  Pulse 57  Temp(Src) 98.3 F (36.8 C) (Oral)  Resp 20  Ht  (1.676 m)  Wt 262 lb (118.842  kg)  BMI 42.31 kg/m2  SpO2 94%  Vital signs normal except bradycardia  Physical Exam  Nursing note and vitals reviewed. Constitutional: He is oriented to person, place, and time. He appears well-developed and well-nourished.  Non-toxic appearance. He does not appear ill. No distress.  HENT:  Head: Normocephalic and atraumatic.  Right Ear: External ear normal.  Left Ear: External ear normal.  Nose: Nose normal. No mucosal edema or rhinorrhea.  Mouth/Throat: Mucous membranes are normal. No dental abscesses or uvula swelling.  Eyes: Conjunctivae and EOM are normal. Pupils are equal, round, and reactive to light.  Neck: Normal range of motion and full passive range of motion without pain. Neck supple.  Pulmonary/Chest: Effort normal and breath sounds normal. No respiratory distress. He has no rhonchi. He exhibits no crepitus.  Abdominal: Soft. Normal appearance and bowel sounds are normal. He exhibits no distension. There is no tenderness. There is no rebound and no guarding.  Genitourinary:  Normal external genitalia. Tender right epididymus and right testicle. No masses. Left side nontender and left testicle nontender.   Musculoskeletal: Normal range of motion. He exhibits no edema and no tenderness.  Moves all extremities well.   Neurological: He is alert and oriented to person, place, and time. He has normal strength. No cranial nerve deficit.  Skin: Skin is warm, dry and intact. No rash noted.  No erythema. No pallor.  Psychiatric: He has a normal mood and affect. His speech is normal and behavior is normal. His mood appears not anxious.    ED Course  Procedures (including critical care time) Medications  ciprofloxacin (CIPRO) tablet 500 mg (not administered)     Pt refused oral or IM pain medications.  Pt given his test results. He was started on oral cipro for his UTI/epididymitis/poss prostatitis    Labs Review Results for orders placed during the hospital encounter of  04/19/14  URINALYSIS, ROUTINE W REFLEX MICROSCOPIC      Result Value Ref Range   Color, Urine YELLOW  YELLOW   APPearance CLOUDY (*) CLEAR   Specific Gravity, Urine 1.023  1.005 - 1.030   pH 5.5  5.0 - 8.0   Glucose, UA NEGATIVE  NEGATIVE mg/dL   Hgb urine dipstick NEGATIVE  NEGATIVE   Bilirubin Urine NEGATIVE  NEGATIVE   Ketones, ur NEGATIVE  NEGATIVE mg/dL   Protein, ur NEGATIVE  NEGATIVE mg/dL   Urobilinogen, UA 0.2  0.0 - 1.0 mg/dL   Nitrite NEGATIVE  NEGATIVE   Leukocytes, UA SMALL (*) NEGATIVE  URINE MICROSCOPIC-ADD ON      Result Value Ref Range   Squamous Epithelial / LPF RARE  RARE   WBC, UA 3-6  <3 WBC/hpf   Bacteria, UA RARE  RARE   Laboratory interpretation all normal except poss uti   Imaging Review US Scrotum  US Art/ven Flow Abd Pelv Doppler  04/19/2014   CLINICAL DATA:  Right-sided groin pain  EXAM: ULTRASOUND OF SCROTUM  TECHNIQUE: Complete ultrasound examination of the testicles, epididymis, and other scrotal structures was performed.  COMPARISON:  None.  FINDINGS: Right testicle  Measurements: 4.8 x 3.2 x 2.3 cm. No mass or microlithiasis visualized.  Left testicle  Measurements: 4 x 2.2 x 2.3 cm. No mass or microlithiasis visualized.  Right epididymis:  Normal in size and appearance.  Left epididymis:  Normal in size and appearance.  Hydrocele:  None visualized.  Varicocele:  A small left varicocele is visualized.  IMPRESSION: Small left varicocele.  No evidence for testicular mass or torsion.   Electronically Signed   By: Burman Nieves M.D.   On: 04/19/2014 23:02     EKG Interpretation None      MDM   Final diagnoses:  Epididymitis, right  Acute cystitis without hematuria    New Prescriptions   CIPROFLOXACIN (CIPRO) 500 MG TABLET    Take 1 tablet (500 mg total) by mouth 2 (two) times daily.    Plan discharge  Devoria Albe, MD, Franz Dell, MD 04/19/14 [id number]

## 2014-04-19 NOTE — Discharge Instructions (Signed)
Drink plenty of fluids. Take the antibiotics until gone. Recheck if you get a fever, vomiting, worsening pain. You can take ibuprofen 600 mg 3-4 times a day for pain or acetaminophen 1000 mg every 4 times a day for pain.     Epididymitis Epididymitis is a swelling (inflammation) of the epididymis. The epididymis is a cord-like structure along the back part of the testicle. Epididymitis is usually, but not always, caused by infection. This is usually a sudden problem beginning with chills, fever and pain behind the scrotum and in the testicle. There may be swelling and redness of the testicle. DIAGNOSIS  Physical examination will reveal a tender, swollen epididymis. Sometimes, cultures are obtained from the urine or from prostate secretions to help find out if there is an infection or if the cause is a different problem. Sometimes, blood work is performed to see if your white blood cell count is elevated and if a germ (bacterial) or viral infection is present. Using this knowledge, an appropriate medicine which kills germs (antibiotic) can be chosen by your caregiver. A viral infection causing epididymitis will most often go away (resolve) without treatment. HOME CARE INSTRUCTIONS   Hot sitz baths for 20 minutes, 4 times per day, may help relieve pain.  Only take over-the-counter or prescription medicines for pain, discomfort or fever as directed by your caregiver.  Take all medicines, including antibiotics, as directed. Take the antibiotics for the full prescribed length of time even if you are feeling better.  It is very important to keep all follow-up appointments. SEEK IMMEDIATE MEDICAL CARE IF:   You have a fever.  You have pain not relieved with medicines.  You have any worsening of your problems.  Your pain seems to come and go.  You develop pain, redness, and swelling in the scrotum and surrounding areas. MAKE SURE YOU:   Understand these instructions.  Will watch your  condition.  Will get help right away if you are not doing well or get worse. Document Released: 07/30/2000 Document Revised: 10/25/2011 Document Reviewed: 06/19/2009 [facility] Patient Information 2015 [place], [place]. This information is not intended to replace advice given to you by your health care provider. Make sure you discuss any questions you have with your health care provider.

## 2014-04-19 NOTE — ED Notes (Signed)
Pt c/o R groin pain x 2 days, denies injury. Denies hematuria. Pt also c/o stiffness in his legs bilat when he gets up in the morning. A & O, NAD

## 2014-05-03 ENCOUNTER — Ambulatory Visit (INDEPENDENT_AMBULATORY_CARE_PROVIDER_SITE_OTHER): Payer: Commercial Managed Care - HMO | Admitting: Family Medicine

## 2014-05-03 VITALS — BP 118/68 | HR 67 | Temp 97.0°F | Ht 66.0 in | Wt 266.0 lb

## 2014-05-03 DIAGNOSIS — N451 Epididymitis: Secondary | ICD-10-CM

## 2014-05-03 DIAGNOSIS — N453 Epididymo-orchitis: Secondary | ICD-10-CM

## 2014-05-03 LAB — POCT URINALYSIS DIPSTICK
Bilirubin, UA: NEGATIVE
Glucose, UA: NEGATIVE
Ketones, UA: NEGATIVE
Nitrite, UA: NEGATIVE
Protein, UA: NEGATIVE
Spec Grav, UA: 1.025
Urobilinogen, UA: NEGATIVE
pH, UA: 6

## 2014-05-03 LAB — POCT UA - MICROSCOPIC ONLY
Bacteria, U Microscopic: NEGATIVE
Casts, Ur, LPF, POC: NEGATIVE
Crystals, Ur, HPF, POC: NEGATIVE
Mucus, UA: NEGATIVE
Yeast, UA: NEGATIVE

## 2014-05-03 MED ORDER — CIPROFLOXACIN HCL 500 MG PO TABS: 500.0000 mg | ORAL_TABLET | Freq: Two times a day (BID) | ORAL | Status: DC

## 2014-05-03 NOTE — Addendum Note (Signed)
Addended by: Deatra Canter on: 05/03/2014 01:09 PM   Modules accepted: Orders

## 2014-05-03 NOTE — Progress Notes (Signed)
   Subjective:    Patient ID: Derek [name], male    DOB: 09/05/1945, 68 y.o.   MRN: [id number]  HPI    Review of Systems     Objective:   Physical Exam        Assessment & Plan:

## 2014-05-03 NOTE — Progress Notes (Signed)
   Subjective:    Patient ID: Derek [name], male    DOB: 1945/10/14, 68 y.o.   MRN: [id number]  HPI This 68 y.o. male presents for evaluation of right epididymitis.  He was seen and tx'd with cipro for 2 weeks and is feeling a lot better.  He was told to follow up with his PCP.   Review of Systems C/o epididymitis No chest pain, SOB, HA, dizziness, vision change, N/V, diarrhea, constipation, dysuria, urinary urgency or frequency, myalgias, arthralgias or rash.     Objective:   Physical Exam Vital signs noted  Well developed well nourished male.  HEENT - Head atraumatic Normocephalic Respiratory - Lungs CTA bilateral Cardiac - RRR S1 and S2 without murmur GI - Abdomen soft Nontender and bowel sounds active x 4 GU - Testes normal w/o tenderness       Assessment & Plan:  Epididymitis - Plan: POCT urinalysis dipstick, POCT UA - Microscopic Only, Urine culture This has resolved and recommend he follow up prn  Deatra Canter FNP

## 2014-05-04 LAB — URINE CULTURE: Organism ID, Bacteria: NO GROWTH

## 2014-05-17 ENCOUNTER — Encounter: Payer: Self-pay | Admitting: Family Medicine

## 2014-05-17 ENCOUNTER — Ambulatory Visit (INDEPENDENT_AMBULATORY_CARE_PROVIDER_SITE_OTHER): Payer: Commercial Managed Care - HMO | Admitting: Family Medicine

## 2014-05-17 VITALS — BP 117/68 | HR 76 | Temp 97.4°F | Ht 66.0 in | Wt 267.0 lb

## 2014-05-17 DIAGNOSIS — N451 Epididymitis: Secondary | ICD-10-CM

## 2014-05-17 LAB — POCT URINALYSIS DIPSTICK
Bilirubin, UA: NEGATIVE
Blood, UA: NEGATIVE
Glucose, UA: NEGATIVE
Ketones, UA: NEGATIVE
Nitrite, UA: NEGATIVE
Protein, UA: NEGATIVE
Spec Grav, UA: 1.02
Urobilinogen, UA: NEGATIVE
pH, UA: 5

## 2014-05-17 LAB — POCT UA - MICROSCOPIC ONLY
Bacteria, U Microscopic: NEGATIVE
Casts, Ur, LPF, POC: NEGATIVE
Crystals, Ur, HPF, POC: NEGATIVE
Mucus, UA: NEGATIVE
RBC, urine, microscopic: NEGATIVE
Yeast, UA: NEGATIVE

## 2014-05-17 MED ORDER — DOXYCYCLINE HYCLATE 100 MG PO TABS: 100.0000 mg | ORAL_TABLET | Freq: Two times a day (BID) | ORAL | Status: DC

## 2014-05-17 MED ORDER — CEFTRIAXONE SODIUM 1 G IJ SOLR
1.0000 g | INTRAMUSCULAR | Status: AC
  Administered 2014-05-17: 1 g via INTRAMUSCULAR

## 2014-05-17 NOTE — Progress Notes (Signed)
   Subjective:    Patient ID: Derek [name], Derek [name]    DOB: 01/18/1946, 68 y.o.   MRN: [id number]  HPI  C/o right testicular discomfort and he has been on cipro for several weeks and is not better.  He states that he had this occur earlier in the year and he got shot and abx's and did well.  He has had US And this showed small left varicocele.  Review of Systems C/o right testicular discomfort No chest pain, SOB, HA, dizziness, vision change, N/V, diarrhea, constipation, dysuria, urinary urgency or frequency, myalgias, arthralgias or rash.     Objective:   Physical Exam  Vital signs noted  Well developed well nourished Derek [name].  HEENT - Head atraumatic Normocephalic Respiratory - Lungs CTA bilateral Cardiac - RRR S1 and S2 without murmur GI - Abdomen soft Nontender and bowel sounds active x 4 GU - Tenderness right testicle but no swelling.    Assessment & Plan:  Epididymitis - Plan: doxycycline (VIBRA-TABS) 100 MG tablet, cefTRIAXone (ROCEPHIN) injection 1 g, POCT UA - Microscopic Only, POCT urinalysis dipstick, Urine culture  Deatra CanterWilliam J Oxford FNP

## 2014-05-18 LAB — URINE CULTURE

## 2014-06-25 ENCOUNTER — Ambulatory Visit (INDEPENDENT_AMBULATORY_CARE_PROVIDER_SITE_OTHER): Payer: Commercial Managed Care - HMO

## 2014-06-25 DIAGNOSIS — Z23 Encounter for immunization: Secondary | ICD-10-CM

## 2014-08-17 DIAGNOSIS — A749 Chlamydial infection, unspecified: Secondary | ICD-10-CM | POA: Diagnosis not present

## 2014-08-17 DIAGNOSIS — R369 Urethral discharge, unspecified: Secondary | ICD-10-CM | POA: Diagnosis not present

## 2014-09-12 DIAGNOSIS — R0602 Shortness of breath: Secondary | ICD-10-CM | POA: Diagnosis not present

## 2014-09-12 DIAGNOSIS — R42 Dizziness and giddiness: Secondary | ICD-10-CM | POA: Diagnosis not present

## 2014-09-12 DIAGNOSIS — R202 Paresthesia of skin: Secondary | ICD-10-CM | POA: Diagnosis not present

## 2015-01-18 ENCOUNTER — Ambulatory Visit (INDEPENDENT_AMBULATORY_CARE_PROVIDER_SITE_OTHER): Payer: Commercial Managed Care - HMO | Admitting: Emergency Medicine

## 2015-01-18 VITALS — BP 100/70 | HR 74 | Temp 98.2°F | Ht 65.0 in | Wt 267.1 lb

## 2015-01-18 DIAGNOSIS — R300 Dysuria: Secondary | ICD-10-CM

## 2015-01-18 DIAGNOSIS — Z202 Contact with and (suspected) exposure to infections with a predominantly sexual mode of transmission: Secondary | ICD-10-CM | POA: Diagnosis not present

## 2015-01-18 LAB — RPR

## 2015-01-18 MED ORDER — CEFTRIAXONE SODIUM 1 G IJ SOLR
250.0000 mg | Freq: Once | INTRAMUSCULAR | Status: AC
  Administered 2015-01-18: 250 mg via INTRAMUSCULAR

## 2015-01-18 MED ORDER — DOXYCYCLINE HYCLATE 100 MG PO CAPS: 100.0000 mg | ORAL_CAPSULE | Freq: Two times a day (BID) | ORAL | Status: DC

## 2015-01-18 NOTE — Patient Instructions (Signed)

## 2015-01-18 NOTE — Progress Notes (Signed)
Subjective:  Patient ID: Derek [name], male    DOB: [birth year]  Age: 69 y.o. MRN: [id number]  CC: Urinary Tract Infection   HPI Derek [name] presents  with urethral discharge and burning when he urinates. He has recent sexual encounter 3 weeks ago and did not use a condom. He has no rash blisters or other external manifestation of STDs. History of prior STDs. He has also history of multiple episodes of epididymitis. He has no fever or chills. No systemic rash. No joint pains. He denies any improvement with over-the-counter medication.  History Derek [name] has a past medical history of Urethritis; GERD (gastroesophageal reflux disease); and Obesity.   He has no past surgical history on file.   His  family history includes Cancer in his mother; Diabetes in his father.  He   reports that he has never smoked. He does not have any smokeless tobacco history on file. He reports that he does not drink alcohol or use illicit drugs.  Outpatient Prescriptions Prior to Visit  Medication Sig Dispense Refill  . cholecalciferol (VITAMIN D) 1000 UNITS tablet Take 1,000 Units by mouth daily.    Marland Kitchen omeprazole (PRILOSEC) 20 MG capsule Take 20 mg by mouth daily.    . vitamin B-12 (CYANOCOBALAMIN) 1000 MCG tablet Take 1,000 mcg by mouth daily.    . ciprofloxacin (CIPRO) 500 MG tablet Take 1 tablet (500 mg total) by mouth 2 (two) times daily. 28 tablet 0  . doxycycline (VIBRA-TABS) 100 MG tablet Take 1 tablet (100 mg total) by mouth 2 (two) times daily. 28 tablet 0   No facility-administered medications prior to visit.    History   Social History  . Marital Status: Married    Spouse Name: N/A  . Number of Children: N/A  . Years of Education: N/A   Social History Main Topics  . Smoking status: Never Smoker   . Smokeless tobacco: Not on file  . Alcohol Use: No  . Drug Use: No  . Sexual Activity: Not on file   Other Topics Concern  . None   Social History Narrative     Review of Systems    Constitutional: Negative for fever, chills and appetite change.  HENT: Negative for congestion, ear pain, postnasal drip, sinus pressure and sore throat.   Eyes: Negative for pain and redness.  Respiratory: Negative for cough, shortness of breath and wheezing.   Cardiovascular: Negative for leg swelling.  Gastrointestinal: Negative for nausea, vomiting, abdominal pain, diarrhea, constipation and blood in stool.  Endocrine: Negative for polyuria.  Genitourinary: Positive for dysuria and discharge. Negative for urgency, frequency and flank pain.  Musculoskeletal: Negative for gait problem.  Skin: Negative for rash.  Neurological: Negative for weakness and headaches.  Psychiatric/Behavioral: Negative for confusion and decreased concentration. The patient is not nervous/anxious.     Objective:  BP 100/70 mmHg  Pulse 74  Temp(Src) 98.2 F (36.8 C) (Oral)  Ht  (1.651 m)  Wt 267 lb 2 oz (121.167 kg)  BMI 44.45 kg/m2  SpO2 97%  Physical Exam  Constitutional: He is oriented to person, place, and time. He appears well-developed and well-nourished. No distress.  HENT:  Head: Normocephalic and atraumatic.  Right Ear: External ear normal.  Left Ear: External ear normal.  Nose: Nose normal.  Eyes: Conjunctivae and EOM are normal. Pupils are equal, round, and reactive to light. No scleral icterus.  Neck: Normal range of motion. Neck supple. No tracheal deviation present.  Cardiovascular: Normal rate, regular rhythm  and normal heart sounds.   Pulmonary/Chest: Effort normal. No respiratory distress. He has no wheezes. He has no rales.  Abdominal: He exhibits no mass. There is no tenderness. There is no rebound and no guarding.  Musculoskeletal: He exhibits no edema.  Lymphadenopathy:    He has no cervical adenopathy.  Neurological: He is alert and oriented to person, place, and time. Coordination normal.  Skin: Skin is warm and dry. No rash noted.  Psychiatric: He has a normal mood and  affect. His behavior is normal.      Assessment & Plan:   Derek [name] was seen today for urinary tract infection.  Diagnoses and all orders for this visit:  STD exposure Orders: -     GC/Chlamydia Probe Amp -     RPR -     HSV(herpes simplex vrs) 1+2 ab-IgG -     cefTRIAXone (ROCEPHIN) injection 250 mg; Inject 0.25 g (250 mg total) into the muscle once.  Dysuria Orders: -     GC/Chlamydia Probe Amp -     RPR -     HSV(herpes simplex vrs) 1+2 ab-IgG -     cefTRIAXone (ROCEPHIN) injection 250 mg; Inject 0.25 g (250 mg total) into the muscle once.  Other orders -     doxycycline (VIBRAMYCIN) 100 MG capsule; Take 1 capsule (100 mg total) by mouth 2 (two) times daily.   I have discontinued Mr. Graveline's ciprofloxacin and doxycycline. I am also having him start on doxycycline. Additionally, I am having him maintain his omeprazole, cholecalciferol, and vitamin B-12. We administered cefTRIAXone.  Meds ordered this encounter  Medications  . doxycycline (VIBRAMYCIN) 100 MG capsule    Sig: Take 1 capsule (100 mg total) by mouth 2 (two) times daily.    Dispense:  20 capsule    Refill:  0  . cefTRIAXone (ROCEPHIN) injection 250 mg    Sig:     Order Specific Question:  Antibiotic Indication:    Answer:  STD    Appropriate red flag conditions were discussed with the patient as well as actions that should be taken.  Patient expressed his understanding.  Follow-up: Return if symptoms worsen or fail to improve.  Carmelina DaneAnderson, Nevaeh Korte S, MD

## 2015-01-20 LAB — HSV(HERPES SIMPLEX VRS) I + II AB-IGG
HSV 1 Glycoprotein G Ab, IgG: 7.94 IV — ABNORMAL HIGH
HSV 2 Glycoprotein G Ab, IgG: 0.11 IV

## 2015-01-21 LAB — GC/CHLAMYDIA PROBE AMP
CT Probe RNA: POSITIVE — AB
GC Probe RNA: NEGATIVE

## 2015-01-31 ENCOUNTER — Ambulatory Visit (INDEPENDENT_AMBULATORY_CARE_PROVIDER_SITE_OTHER): Payer: Commercial Managed Care - HMO | Admitting: Emergency Medicine

## 2015-01-31 VITALS — BP 118/68 | HR 79 | Temp 98.3°F | Resp 17 | Ht 65.0 in | Wt 270.4 lb

## 2015-01-31 DIAGNOSIS — A749 Chlamydial infection, unspecified: Secondary | ICD-10-CM

## 2015-01-31 MED ORDER — AZITHROMYCIN 250 MG PO TABS: 1000.0000 mg | ORAL_TABLET | Freq: Once | ORAL | Status: DC

## 2015-01-31 NOTE — Progress Notes (Signed)
Subjective:  Patient ID: Derek [name], male    DOB: 1946/03/09  Age: 69 y.o. MRN: [id number]  CC: Follow-up   HPI Derek [name] presents  with a concern that he has been inadequately treated for his mania infection. He was given a prescription for doxycycline and completed the antibiotic. He still has dysuria and discharge. He has no fever chills nausea vomiting or rash. He has not been reinfected in his mind. He's using a condom every time he has intercourse. He denies any other complaints.  History Doc has a past medical history of Urethritis; GERD (gastroesophageal reflux disease); and Obesity.   He has no past surgical history on file.   His  family history includes Cancer in his mother; Diabetes in his father.  He   reports that he has never smoked. He does not have any smokeless tobacco history on file. He reports that he does not drink alcohol or use illicit drugs.  Outpatient Prescriptions Prior to Visit  Medication Sig Dispense Refill  . cholecalciferol (VITAMIN D) 1000 UNITS tablet Take 1,000 Units by mouth daily.    . vitamin B-12 (CYANOCOBALAMIN) 1000 MCG tablet Take 1,000 mcg by mouth daily.    Marland Kitchen doxycycline (VIBRAMYCIN) 100 MG capsule Take 1 capsule (100 mg total) by mouth 2 (two) times daily. (Patient not taking: Reported on 01/31/2015) 20 capsule 0  . omeprazole (PRILOSEC) 20 MG capsule Take 20 mg by mouth daily.     No facility-administered medications prior to visit.    History   Social History  . Marital Status: Married    Spouse Name: N/A  . Number of Children: N/A  . Years of Education: N/A   Social History Main Topics  . Smoking status: Never Smoker   . Smokeless tobacco: Not on file  . Alcohol Use: No  . Drug Use: No  . Sexual Activity: Not on file   Other Topics Concern  . None   Social History Narrative     Review of Systems  Constitutional: Negative for fever, chills and appetite change.  HENT: Negative for congestion, ear pain,  postnasal drip, sinus pressure and sore throat.   Eyes: Negative for pain and redness.  Respiratory: Negative for cough, shortness of breath and wheezing.   Cardiovascular: Negative for leg swelling.  Gastrointestinal: Negative for nausea, vomiting, abdominal pain, diarrhea, constipation and blood in stool.  Endocrine: Negative for polyuria.  Genitourinary: Positive for dysuria. Negative for urgency, frequency and flank pain.  Musculoskeletal: Negative for gait problem.  Skin: Negative for rash.  Neurological: Negative for weakness and headaches.  Psychiatric/Behavioral: Negative for confusion and decreased concentration. The patient is not nervous/anxious.     Objective:  BP 118/68 mmHg  Pulse 79  Temp(Src) 98.3 F (36.8 C) (Oral)  Resp 17  Ht  (1.651 m)  Wt 270 lb 6.4 oz (122.653 kg)  BMI 45.00 kg/m2  SpO2 95%  Physical Exam  Constitutional: He is oriented to person, place, and time. He appears well-developed and well-nourished.  HENT:  Head: Normocephalic and atraumatic.  Eyes: Conjunctivae are normal. Pupils are equal, round, and reactive to light.  Pulmonary/Chest: Effort normal.  Musculoskeletal: He exhibits no edema.  Neurological: He is alert and oriented to person, place, and time.  Skin: Skin is dry.  Psychiatric: He has a normal mood and affect. His behavior is normal. Thought content normal.      Assessment & Plan:   Derek [name] was seen today for follow-up.  Diagnoses and all  orders for this visit:  Chlamydia infection  Other orders -     azithromycin (ZITHROMAX) 250 MG tablet; Take 4 tablets (1,000 mg total) by mouth once.   I am having Derek [name] start on azithromycin. I am also having him maintain his omeprazole, cholecalciferol, vitamin B-12, and doxycycline.  Meds ordered this encounter  Medications  . azithromycin (ZITHROMAX) 250 MG tablet    Sig: Take 4 tablets (1,000 mg total) by mouth once.    Dispense:  4 tablet    Refill:  0   He was  previously treated with doxycycline so or for azithromycin was given no follow-up is needed  Appropriate red flag conditions were discussed with the patient as well as actions that should be taken.  Patient expressed his understanding.  Follow-up: Return if symptoms worsen or fail to improve.  Carmelina Dane, MD

## 2015-01-31 NOTE — Patient Instructions (Signed)
Chlamydia Chlamydia is an infection. It is spread through sexual contact. Chlamydia can be in different areas of the body. These areas include the urethra, throat, or rectum. It is important to treat chlamydia as soon as possible. It can damage other organs.  CAUSES  Chlamydia is caused by bacteria. It is a sexually transmitted disease. This means that it is passed from an infected partner during intimate contact. This contact could be with the genitals, mouth, or rectal area.  SIGNS AND SYMPTOMS  There may not be any symptoms. This is often the case early in the infection. If there are symptoms, they are usually mild and may only be noticeable in the morning. Symptoms you may notice include:   Burning with urination.  Pain or swelling in the testicles.  Watery mucus-like discharge from the penis.  Long-standing (chronic) pelvic pain after frequent infections.  Pain, swelling, or itching around the anus.  A sore throat.  Itching, burning, or redness in the eyes, or discharge from the eyes. DIAGNOSIS  To diagnose this infection, your health care provider will do a pelvic exam. A sample of urine or a swab from the rectum may be taken for testing.  TREATMENT  Chlamydia is treated with antibiotic medicines.  HOME CARE INSTRUCTIONS  Take your antibiotic medicine as directed by your health care provider. Finish the antibiotic even if you start to feel better. Incomplete treatment will put you at risk for not being able to have children (sterility).   Take medicines only as directed by your health care provider.   Rest.   Inform any sexual partners about your infection. Even if they are symptom free or have a negative culture or evaluation, they should be treated for the condition.   Do not have sex (intercourse) until treatment is completed and your health care provider says it is okay.   Keep all follow-up visits as directed by your health care provider.   Not all test results  are available during your visit. If your test results are not back during the visit, make an appointment with your health care provider to find out the results. Do not assume everything is normal if you have not heard from your health care provider or the medical facility. It is your responsibility to get your test results. SEEK MEDICAL CARE IF:  You develop new joint pain.  You have a fever. SEEK IMMEDIATE MEDICAL CARE IF:   Your pain increases.   You have abnormal discharge.   You have pain during intercourse. MAKE SURE YOU:   Understand these instructions.  Will watch your condition.  Will get help right away if you are not doing well or get worse. Document Released: 08/02/2005 Document Revised: 12/17/2013 Document Reviewed: 02/08/2013 [employer] Patient Information 2015 [employer]. This information is not intended to replace advice given to you by your health care provider. Make sure you discuss any questions you have with your health care provider.  

## 2015-11-15 ENCOUNTER — Emergency Department (HOSPITAL_COMMUNITY): Payer: Medicare HMO

## 2015-11-15 ENCOUNTER — Emergency Department (HOSPITAL_COMMUNITY)
Admission: EM | Admit: 2015-11-15 | Discharge: 2015-11-16 | Disposition: A | Discharge status: Home / Self Care | Payer: Medicare HMO | Attending: Emergency Medicine | Admitting: Emergency Medicine

## 2015-11-15 ENCOUNTER — Encounter (HOSPITAL_COMMUNITY): Payer: Self-pay | Admitting: *Deleted

## 2015-11-15 DIAGNOSIS — Z79899 Other long term (current) drug therapy: Secondary | ICD-10-CM | POA: Insufficient documentation

## 2015-11-15 DIAGNOSIS — Y9389 Activity, other specified: Secondary | ICD-10-CM | POA: Insufficient documentation

## 2015-11-15 DIAGNOSIS — E669 Obesity, unspecified: Secondary | ICD-10-CM | POA: Diagnosis not present

## 2015-11-15 DIAGNOSIS — R52 Pain, unspecified: Secondary | ICD-10-CM | POA: Diagnosis not present

## 2015-11-15 DIAGNOSIS — S0990XA Unspecified injury of head, initial encounter: Secondary | ICD-10-CM | POA: Diagnosis not present

## 2015-11-15 DIAGNOSIS — M545 Low back pain: Secondary | ICD-10-CM | POA: Diagnosis not present

## 2015-11-15 DIAGNOSIS — S199XXA Unspecified injury of neck, initial encounter: Secondary | ICD-10-CM | POA: Diagnosis not present

## 2015-11-15 DIAGNOSIS — Z87438 Personal history of other diseases of male genital organs: Secondary | ICD-10-CM | POA: Diagnosis not present

## 2015-11-15 DIAGNOSIS — M25512 Pain in left shoulder: Secondary | ICD-10-CM | POA: Diagnosis not present

## 2015-11-15 DIAGNOSIS — Z046 Encounter for general psychiatric examination, requested by authority: Secondary | ICD-10-CM | POA: Insufficient documentation

## 2015-11-15 DIAGNOSIS — Y92410 Unspecified street and highway as the place of occurrence of the external cause: Secondary | ICD-10-CM | POA: Diagnosis not present

## 2015-11-15 DIAGNOSIS — Y998 Other external cause status: Secondary | ICD-10-CM | POA: Diagnosis not present

## 2015-11-15 DIAGNOSIS — R51 Headache: Secondary | ICD-10-CM | POA: Diagnosis not present

## 2015-11-15 DIAGNOSIS — M542 Cervicalgia: Secondary | ICD-10-CM | POA: Diagnosis not present

## 2015-11-15 DIAGNOSIS — K219 Gastro-esophageal reflux disease without esophagitis: Secondary | ICD-10-CM | POA: Insufficient documentation

## 2015-11-15 HISTORY — DX: Pure hypercholesterolemia, unspecified: E78.00
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
## 2015-11-16 MED ORDER — OXYCODONE-ACETAMINOPHEN 5-325 MG PO TABS: 1.0000 | ORAL_TABLET | ORAL | Status: DC | PRN

## 2015-11-16 MED ORDER — OXYCODONE-ACETAMINOPHEN 5-325 MG PO TABS
1.0000 | ORAL_TABLET | Freq: Once | ORAL | Status: AC
  Administered 2015-11-16: 1 via ORAL
  Filled 2015-11-16: qty 1
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
## 2015-11-26 ENCOUNTER — Encounter (INDEPENDENT_AMBULATORY_CARE_PROVIDER_SITE_OTHER): Payer: Self-pay

## 2015-11-26 ENCOUNTER — Ambulatory Visit: Payer: Commercial Managed Care - HMO | Admitting: Family Medicine

## 2015-11-27 ENCOUNTER — Encounter: Payer: Self-pay | Admitting: Family Medicine

## 2015-11-27 ENCOUNTER — Encounter (INDEPENDENT_AMBULATORY_CARE_PROVIDER_SITE_OTHER): Payer: Self-pay

## 2015-11-27 ENCOUNTER — Ambulatory Visit (INDEPENDENT_AMBULATORY_CARE_PROVIDER_SITE_OTHER): Payer: Medicare HMO | Admitting: Family Medicine

## 2015-11-27 ENCOUNTER — Ambulatory Visit (INDEPENDENT_AMBULATORY_CARE_PROVIDER_SITE_OTHER): Payer: Medicare HMO

## 2015-11-27 VITALS — BP 111/70 | HR 83 | Temp 97.5°F | Ht 65.0 in | Wt 262.2 lb

## 2015-11-27 DIAGNOSIS — M25572 Pain in left ankle and joints of left foot: Secondary | ICD-10-CM
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
## 2015-11-27 MED ORDER — CYCLOBENZAPRINE HCL 10 MG PO TABS: 10.0000 mg | ORAL_TABLET | Freq: Three times a day (TID) | ORAL | Status: DC | PRN

## 2015-11-27 MED ORDER — OXYCODONE-ACETAMINOPHEN 5-325 MG PO TABS: 1.0000 | ORAL_TABLET | Freq: Three times a day (TID) | ORAL | Status: DC | PRN
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
## 2015-12-11 ENCOUNTER — Ambulatory Visit: Payer: Commercial Managed Care - HMO | Admitting: Family Medicine

## 2016-06-01 ENCOUNTER — Ambulatory Visit: Payer: Medicare HMO | Admitting: Family Medicine

## 2016-06-01 ENCOUNTER — Telehealth: Payer: Self-pay | Admitting: Family Medicine

## 2016-06-01 NOTE — Telephone Encounter (Signed)
Since orthopedic doctors her surgeons many probably does not have a surgical problem would recommend chiropractor

## 2016-06-02 ENCOUNTER — Encounter: Payer: Self-pay | Admitting: Family Medicine

## 2016-06-02 NOTE — Telephone Encounter (Signed)
Full voice mail , could not leave a message.

## 2016-06-07 ENCOUNTER — Telehealth: Payer: Self-pay | Admitting: Family Medicine

## 2016-06-07 DIAGNOSIS — M542 Cervicalgia: Secondary | ICD-10-CM

## 2016-06-07 NOTE — Telephone Encounter (Signed)
Please address

## 2016-06-08 NOTE — Telephone Encounter (Signed)
Referral placed to chiropractor
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
## 2016-06-15 ENCOUNTER — Ambulatory Visit (INDEPENDENT_AMBULATORY_CARE_PROVIDER_SITE_OTHER): Payer: Commercial Managed Care - HMO | Admitting: Family Medicine

## 2016-06-15 ENCOUNTER — Encounter: Payer: Self-pay | Admitting: Family Medicine

## 2016-06-15 VITALS — BP 119/72 | HR 82 | Temp 98.0°F | Ht 65.0 in | Wt 266.0 lb

## 2016-06-15 DIAGNOSIS — G8929 Other chronic pain: Secondary | ICD-10-CM | POA: Diagnosis not present

## 2016-06-15 DIAGNOSIS — Z23 Encounter for immunization: Secondary | ICD-10-CM | POA: Diagnosis not present

## 2016-06-15 DIAGNOSIS — Z131 Encounter for screening for diabetes mellitus: Secondary | ICD-10-CM | POA: Diagnosis not present

## 2016-06-15 DIAGNOSIS — Z125 Encounter for screening for malignant neoplasm of prostate: Secondary | ICD-10-CM | POA: Diagnosis not present

## 2016-06-15 DIAGNOSIS — Z202 Contact with and (suspected) exposure to infections with a predominantly sexual mode of transmission: Secondary | ICD-10-CM | POA: Diagnosis not present

## 2016-06-15 DIAGNOSIS — M25572 Pain in left ankle and joints of left foot: Secondary | ICD-10-CM

## 2016-06-15 DIAGNOSIS — Z0189 Encounter for other specified special examinations: Secondary | ICD-10-CM

## 2016-06-15 DIAGNOSIS — M542 Cervicalgia: Secondary | ICD-10-CM

## 2016-06-15 DIAGNOSIS — M25562 Pain in left knee: Secondary | ICD-10-CM

## 2016-06-15 DIAGNOSIS — Z0000 Encounter for general adult medical examination without abnormal findings: Secondary | ICD-10-CM | POA: Diagnosis not present

## 2016-06-15 DIAGNOSIS — M25569 Pain in unspecified knee: Secondary | ICD-10-CM

## 2016-06-15 DIAGNOSIS — Z136 Encounter for screening for cardiovascular disorders: Secondary | ICD-10-CM | POA: Diagnosis not present

## 2016-06-15 MED ORDER — OXYCODONE-ACETAMINOPHEN 5-325 MG PO TABS: 1.0000 | ORAL_TABLET | ORAL | 0 refills | Status: DC

## 2016-06-16 LAB — PSA, TOTAL AND FREE
PSA, Free Pct: 25 %
PSA, Free: 0.15 ng/mL
Prostate Specific Ag, Serum: 0.6 ng/mL (ref 0.0–4.0)

## 2016-06-16 LAB — CMP14+EGFR
ALT: 16 IU/L (ref 0–44)
AST: 20 IU/L (ref 0–40)
Albumin/Globulin Ratio: 1.1 — ABNORMAL LOW (ref 1.2–2.2)
Albumin: 3.9 g/dL (ref 3.6–4.8)
Alkaline Phosphatase: 70 IU/L (ref 39–117)
BUN/Creatinine Ratio: 15 (ref 10–24)
BUN: 17 mg/dL (ref 8–27)
Bilirubin Total: 0.2 mg/dL (ref 0.0–1.2)
CO2: 24 mmol/L (ref 18–29)
Calcium: 9.7 mg/dL (ref 8.6–10.2)
Chloride: 104 mmol/L (ref 96–106)
Creatinine, Ser: 1.13 mg/dL (ref 0.76–1.27)
GFR calc Af Amer: 77 mL/min/{1.73_m2} (ref >59)
GFR calc non Af Amer: 66 mL/min/{1.73_m2} (ref >59)
Globulin, Total: 3.7 g/dL (ref 1.5–4.5)
Glucose: 92 mg/dL (ref 65–99)
Potassium: 4.4 mmol/L (ref 3.5–5.2)
Sodium: 144 mmol/L (ref 134–144)
Total Protein: 7.6 g/dL (ref 6.0–8.5)

## 2016-06-16 LAB — STD SCREEN (8)
HIV Screen 4th Generation wRfx: NONREACTIVE
HSV 1 Glycoprotein G Ab, IgG: 50.4 index — ABNORMAL HIGH (ref 0.00–0.90)
HSV 2 Glycoprotein G Ab, IgG: <0.91 index (ref 0.00–0.90)
Hep A IgM: NEGATIVE
Hep B C IgM: NEGATIVE
Hep C Virus Ab: <0.1 s/co ratio (ref 0.0–0.9)
Hepatitis B Surface Ag: NEGATIVE
RPR Ser Ql: NONREACTIVE

## 2016-06-16 LAB — LIPID PANEL
Chol/HDL Ratio: 4 ratio units (ref 0.0–5.0)
Cholesterol, Total: 157 mg/dL (ref 100–199)
HDL: 39 mg/dL — ABNORMAL LOW (ref >39)
LDL Calculated: 88 mg/dL (ref 0–99)
Triglycerides: 151 mg/dL — ABNORMAL HIGH (ref 0–149)
VLDL Cholesterol Cal: 30 mg/dL (ref 5–40)

## 2016-07-29 NOTE — Telephone Encounter (Signed)
Closing encounter. Pt had another TC encounter on 10/23 & was also seen on 06/15/16

## 2016-08-03 DIAGNOSIS — M19072 Primary osteoarthritis, left ankle and foot: Secondary | ICD-10-CM | POA: Diagnosis not present

## 2016-08-03 DIAGNOSIS — M25572 Pain in left ankle and joints of left foot: Secondary | ICD-10-CM | POA: Diagnosis not present

## 2016-08-19 DIAGNOSIS — M25562 Pain in left knee: Secondary | ICD-10-CM | POA: Diagnosis not present

## 2016-08-19 DIAGNOSIS — M170 Bilateral primary osteoarthritis of knee: Secondary | ICD-10-CM | POA: Diagnosis not present

## 2016-08-19 DIAGNOSIS — M25572 Pain in left ankle and joints of left foot: Secondary | ICD-10-CM | POA: Diagnosis not present

## 2016-08-19 DIAGNOSIS — M25561 Pain in right knee: Secondary | ICD-10-CM | POA: Diagnosis not present

## 2016-09-08 DIAGNOSIS — M170 Bilateral primary osteoarthritis of knee: Secondary | ICD-10-CM | POA: Diagnosis not present

## 2016-09-08 DIAGNOSIS — M25562 Pain in left knee: Secondary | ICD-10-CM | POA: Diagnosis not present

## 2016-09-08 DIAGNOSIS — M25561 Pain in right knee: Secondary | ICD-10-CM | POA: Diagnosis not present

## 2016-09-16 DIAGNOSIS — M25562 Pain in left knee: Secondary | ICD-10-CM | POA: Diagnosis not present

## 2016-09-16 DIAGNOSIS — M1712 Unilateral primary osteoarthritis, left knee: Secondary | ICD-10-CM | POA: Diagnosis not present

## 2016-09-27 DIAGNOSIS — M25562 Pain in left knee: Secondary | ICD-10-CM | POA: Diagnosis not present

## 2016-09-27 DIAGNOSIS — M1712 Unilateral primary osteoarthritis, left knee: Secondary | ICD-10-CM | POA: Diagnosis not present

## 2016-10-12 DIAGNOSIS — M1712 Unilateral primary osteoarthritis, left knee: Secondary | ICD-10-CM | POA: Diagnosis not present

## 2016-10-12 DIAGNOSIS — M25562 Pain in left knee: Secondary | ICD-10-CM | POA: Diagnosis not present

## 2016-10-28 DIAGNOSIS — M1712 Unilateral primary osteoarthritis, left knee: Secondary | ICD-10-CM | POA: Diagnosis not present

## 2016-10-28 DIAGNOSIS — M25562 Pain in left knee: Secondary | ICD-10-CM | POA: Diagnosis not present

## 2016-11-25 ENCOUNTER — Ambulatory Visit (INDEPENDENT_AMBULATORY_CARE_PROVIDER_SITE_OTHER): Payer: Medicare HMO | Admitting: Family Medicine

## 2016-11-25 ENCOUNTER — Encounter: Payer: Self-pay | Admitting: Family Medicine

## 2016-11-25 VITALS — BP 113/69 | HR 88 | Temp 98.7°F | Ht 65.0 in | Wt 260.0 lb

## 2016-11-25 DIAGNOSIS — M19021 Primary osteoarthritis, right elbow: Secondary | ICD-10-CM

## 2016-11-25 DIAGNOSIS — M19072 Primary osteoarthritis, left ankle and foot: Secondary | ICD-10-CM

## 2016-11-25 DIAGNOSIS — R1033 Periumbilical pain: Secondary | ICD-10-CM | POA: Diagnosis not present

## 2016-11-25 MED ORDER — MELOXICAM 7.5 MG PO TABS: 7.5000 mg | ORAL_TABLET | Freq: Every day | ORAL | 2 refills | Status: DC

## 2016-11-25 MED ORDER — CIPROFLOXACIN HCL 500 MG PO TABS: 500.0000 mg | ORAL_TABLET | Freq: Two times a day (BID) | ORAL | 0 refills | Status: DC

## 2016-11-25 NOTE — Progress Notes (Signed)
BP 113/69   Pulse 88   Temp 98.7 F (37.1 C) (Oral)   Ht 5' 5"  (1.651 m)   Wt 260 lb (117.9 kg)   BMI 43.27 kg/m    Subjective:    Patient ID: Derek [name], male    DOB: 08/22/1945, 71 y.o.   MRN: [id number]  HPI: Derek [name] is a 71 y.o. male presenting on 11/25/2016 for Abdominal Pain (began 4 months ago, center of abdomen, hasn't taken anything for it, no diarrhea, constipation or vomiting) and Joint Pain (left ankle pain, right elbow pain; x year or so)   HPI Abdominal pain Patient comes in with central abdominal pain that has been going on and off for the past 4 months. Patient says he hasn't taken anything for it to this point because he did not know what it was. The abdominal pain is been low-grade and 2 out of 10. He denies any diarrhea or constipation or vomiting. He denies any blood in his stool. He feels like he has an infection because he has performed oral sex on a few different women and thinks he sustained abdominal infection from that. He denies any dysuria or hematuria. He does admit that he has intercourse with different women and does not always use protection. He denies any penile discharge or sores or hematuria. He denies any pain radiating anywhere else such as to his flank or down his legs or up his back.  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 He has been taking some Tylenol for this and the occasional Advil but wonders if there is something more that he can take that will cover this little better. He denies any fevers or chills or redness or warmth or loss of range of motion or weakness in either of these joints.  Relevant past medical, surgical, family and social history reviewed and updated as indicated. Interim medical history since our last visit reviewed. Allergies and medications reviewed  and updated.  Review of Systems  Constitutional: Negative for chills and fever.  Respiratory: Negative for shortness of breath and wheezing.   Cardiovascular: Negative for chest pain and leg swelling.  Gastrointestinal: Positive for abdominal pain. Negative for abdominal distention, blood in stool, constipation, diarrhea, nausea and vomiting.  Genitourinary: Negative for discharge, dysuria, frequency, hematuria, penile pain and penile swelling.  Musculoskeletal: Negative for back pain and gait problem.  Skin: Negative for rash.  All other systems reviewed and are negative.  Per HPI unless specifically indicated above     Objective:    BP 113/69   Pulse 88   Temp 98.7 F (37.1 C) (Oral)   Ht 5' 5"  (1.651 m)   Wt 260 lb (117.9 kg)   BMI 43.27 kg/m   Wt Readings from Last 3 Encounters:  11/25/16 260 lb (117.9 kg)  06/15/16 266 lb (120.7 kg)  11/27/15 262 lb 3.2 oz (118.9 kg)    Physical Exam  Constitutional: He is oriented to person, place, and time. He appears well-developed and well-nourished. No distress.  Eyes: Conjunctivae are normal. No scleral icterus.  Cardiovascular: Normal rate, regular rhythm, normal heart sounds and intact distal pulses.   No murmur heard. Pulmonary/Chest: Effort normal and breath sounds normal. No respiratory distress. He has no wheezes. He has no rales.  Abdominal: Soft. Bowel sounds are normal. He exhibits  no distension. There is generalized tenderness (Mild abdominal tenderness generally). There is no rebound and no guarding.  Musculoskeletal: Normal range of motion. He exhibits no edema.  Neurological: He is alert and oriented to person, place, and time. Coordination normal.  Skin: Skin is warm and dry. No rash noted. He is not diaphoretic.  Psychiatric: He has a normal mood and affect. His behavior is normal.  Nursing note and vitals reviewed.  Urinalysis: Mucous present, 0-5 wbc's, 0-10 epithelial cells, 0-2 RBCs, otherwise normal      Assessment & Plan:   Problem List Items Addressed This Visit    None    Visit Diagnoses    Periumbilical abdominal pain    -  Primary   Relevant Medications   ciprofloxacin (CIPRO) 500 MG tablet   Other Relevant Orders   Urinalysis, Complete (Completed)   GC/Chlamydia Probe Amp (Completed)   CMP14+EGFR (Completed)   Arthritis of right elbow       Relevant Medications   meloxicam (MOBIC) 7.5 MG tablet   Arthritis of left ankle       Relevant Medications   meloxicam (MOBIC) 7.5 MG tablet       Follow up plan: Return if symptoms worsen or fail to improve.  Counseling provided for all of the vaccine components Orders Placed This Encounter  Procedures  . GC/Chlamydia Probe Amp  . Urinalysis, Complete  . [place], MD [facility] 11/25/2016, 5:06 PM

## 2016-11-26 LAB — MICROSCOPIC EXAMINATION
Bacteria, UA: NONE SEEN
Casts: NONE SEEN /lpf

## 2016-11-26 LAB — URINALYSIS, COMPLETE
Bilirubin, UA: NEGATIVE
Glucose, UA: NEGATIVE
Ketones, UA: NEGATIVE
Leukocytes, UA: NEGATIVE
Nitrite, UA: NEGATIVE
Protein, UA: NEGATIVE
RBC, UA: NEGATIVE
Specific Gravity, UA: 1.024 (ref 1.005–1.030)
Urobilinogen, Ur: 0.2 mg/dL (ref 0.2–1.0)
pH, UA: 5.5 (ref 5.0–7.5)

## 2016-11-26 LAB — CMP14+EGFR
ALT: 18 IU/L (ref 0–44)
AST: 18 IU/L (ref 0–40)
Albumin/Globulin Ratio: 1.2 (ref 1.2–2.2)
Albumin: 4.3 g/dL (ref 3.6–4.8)
Alkaline Phosphatase: 63 IU/L (ref 39–117)
BUN/Creatinine Ratio: 12 (ref 10–24)
BUN: 17 mg/dL (ref 8–27)
Bilirubin Total: 0.4 mg/dL (ref 0.0–1.2)
CO2: 25 mmol/L (ref 18–29)
Calcium: 9.8 mg/dL (ref 8.6–10.2)
Chloride: 100 mmol/L (ref 96–106)
Creatinine, Ser: 1.39 mg/dL — ABNORMAL HIGH (ref 0.76–1.27)
GFR calc Af Amer: 59 mL/min/{1.73_m2} — ABNORMAL LOW (ref >59)
GFR calc non Af Amer: 51 mL/min/{1.73_m2} — ABNORMAL LOW (ref >59)
Globulin, Total: 3.6 g/dL (ref 1.5–4.5)
Glucose: 102 mg/dL — ABNORMAL HIGH (ref 65–99)
Potassium: 4.5 mmol/L (ref 3.5–5.2)
Sodium: 140 mmol/L (ref 134–144)
Total Protein: 7.9 g/dL (ref 6.0–8.5)

## 2016-11-27 LAB — GC/CHLAMYDIA PROBE AMP
Chlamydia trachomatis, NAA: NEGATIVE
Neisseria gonorrhoeae by PCR: NEGATIVE

## 2017-02-08 DIAGNOSIS — Z0289 Encounter for other administrative examinations: Secondary | ICD-10-CM

## 2017-03-08 ENCOUNTER — Encounter: Payer: Self-pay | Admitting: Family

## 2017-03-08 ENCOUNTER — Ambulatory Visit (INDEPENDENT_AMBULATORY_CARE_PROVIDER_SITE_OTHER): Payer: Medicare HMO | Admitting: Family

## 2017-03-08 VITALS — BP 111/67 | HR 74 | Temp 98.0°F | Ht 65.0 in | Wt 260.0 lb

## 2017-03-08 DIAGNOSIS — R350 Frequency of micturition: Secondary | ICD-10-CM | POA: Diagnosis not present

## 2017-03-08 DIAGNOSIS — R300 Dysuria: Secondary | ICD-10-CM

## 2017-03-08 DIAGNOSIS — R399 Unspecified symptoms and signs involving the genitourinary system: Secondary | ICD-10-CM

## 2017-03-08 MED ORDER — CIPROFLOXACIN HCL 500 MG PO TABS: 500.0000 mg | ORAL_TABLET | Freq: Two times a day (BID) | ORAL | 0 refills | Status: DC

## 2017-03-08 NOTE — Progress Notes (Addendum)
   Subjective:    Patient ID: Derek [name], male    DOB: 06/01/1946, 71 y.o.   MRN: [id number]  Dysuria   This is a new problem. The current episode started in the past 7 days. The problem occurs every urination. The problem has been unchanged. The quality of the pain is described as burning. The pain is at a severity of 7/10. The pain is moderate. There has been no fever. Associated symptoms include flank pain, frequency, hesitancy and urgency. Pertinent negatives include no discharge, hematuria, nausea or vomiting. He has tried increased fluids for the symptoms. The treatment provided no relief.      Review of Systems  Gastrointestinal: Negative for nausea and vomiting.  Genitourinary: Positive for dysuria, flank pain, frequency, hesitancy and urgency. Negative for hematuria.  All other systems reviewed and are negative.      Objective:   Physical Exam  Constitutional: He is oriented to person, place, and time. He appears well-developed and well-nourished. No distress.  HENT:  Head: Normocephalic.  Neck: Normal range of motion. Neck supple. No thyromegaly present.  Cardiovascular: Normal rate, regular rhythm, normal heart sounds and intact distal pulses.   No murmur heard. Pulmonary/Chest: Effort normal and breath sounds normal. No respiratory distress. He has no wheezes.  Abdominal: Soft. Bowel sounds are normal. He exhibits no distension. There is tenderness (lower abd tenderness).  Musculoskeletal: Normal range of motion. He exhibits edema (trace BLE). He exhibits no tenderness.  Neurological: He is alert and oriented to person, place, and time.  Skin: Skin is warm and dry. No rash noted. No erythema.  Psychiatric: He has a normal mood and affect. His behavior is normal. Judgment and thought content normal.  Vitals reviewed.     BP 111/67   Pulse 74   Temp 98 F (36.7 C) (Oral)   Ht 5\' 5"  (1.651 m)   Wt 260 lb (117.9 kg)   BMI 43.27 kg/m      Assessment & Plan:  1.  Dysuria - Urinalysis, Complete - Urine Culture - GC/Chlamydia Probe Amp  3. UTI symptoms Force fluids AZO over the counter X2 days RTO prn Culture pending - ciprofloxacin (CIPRO) 500 MG tablet; Take 1 tablet (500 mg total) by mouth 2 (two) times daily.  Dispense: 20 tablet; Refill: 0   Jannifer Rodneyhristy Preslei Blakley, FNP

## 2017-03-08 NOTE — Addendum Note (Signed)
Addended by: Jannifer RodneyHAWKS, Deral Schellenberg A on: 03/08/2017 09:54 AM   Modules accepted: Orders

## 2017-03-08 NOTE — Patient Instructions (Signed)
Urinary Tract Infection, Adult A urinary tract infection (UTI) is an infection of any part of the urinary tract, which includes the kidneys, ureters, bladder, and urethra. These organs make, store, and get rid of urine in the body. UTI can be a bladder infection (cystitis) or kidney infection (pyelonephritis). What are the causes? This infection may be caused by fungi, viruses, or bacteria. Bacteria are the most common cause of UTIs. This condition can also be caused by repeated incomplete emptying of the bladder during urination. What increases the risk? This condition is more likely to develop if:  You ignore your need to urinate or hold urine for long periods of time.  You do not empty your bladder completely during urination.  You wipe back to front after urinating or having a bowel movement, if you are male.  You are uncircumcised, if you are male.  You are constipated.  You have a urinary catheter that stays in place (indwelling).  You have a weak defense (immune) system.  You have a medical condition that affects your bowels, kidneys, or bladder.  You have diabetes.  You take antibiotic medicines frequently or for long periods of time, and the antibiotics no longer work well against certain types of infections (antibiotic resistance).  You take medicines that irritate your urinary tract.  You are exposed to chemicals that irritate your urinary tract.  You are male. What are the signs or symptoms? Symptoms of this condition include:  Fever.  Frequent urination or passing small amounts of urine frequently.  Needing to urinate urgently.  Pain or burning with urination.  Urine that smells bad or unusual.  Cloudy urine.  Pain in the lower abdomen or back.  Trouble urinating.  Blood in the urine.  Vomiting or being less hungry than normal.  Diarrhea or abdominal pain.  Vaginal discharge, if you are male. How is this diagnosed? This condition is  diagnosed with a medical history and physical exam. You will also need to provide a urine sample to test your urine. Other tests may be done, including:  Blood tests.  Sexually transmitted disease (STD) testing. If you have had more than one UTI, a cystoscopy or imaging studies may be done to determine the cause of the infections. How is this treated? Treatment for this condition often includes a combination of two or more of the following:  Antibiotic medicine.  Other medicines to treat less common causes of UTI.  Over-the-counter medicines to treat pain.  Drinking enough water to stay hydrated. Follow these instructions at home:  Take over-the-counter and prescription medicines only as told by your health care provider.  If you were prescribed an antibiotic, take it as told by your health care provider. Do not stop taking the antibiotic even if you start to feel better.  Avoid alcohol, caffeine, tea, and carbonated beverages. They can irritate your bladder.  Drink enough fluid to keep your urine clear or pale yellow.  Keep all follow-up visits as told by your health care provider. This is important.  Make sure to:  Empty your bladder often and completely. Do not hold urine for long periods of time.  Empty your bladder before and after sex.  Wipe from front to back after a bowel movement if you are male. Use each tissue one time when you wipe. Contact a health care provider if:  You have back pain.  You have a fever.  You feel nauseous or vomit.  Your symptoms do not get better after 3   days.  Your symptoms go away and then return. Get help right away if:  You have severe back pain or lower abdominal pain.  You are vomiting and cannot keep down any medicines or water. This information is not intended to replace advice given to you by your health care provider. Make sure you discuss any questions you have with your health care provider. Document Released:  05/12/2005 Document Revised: 01/14/2016 Document Reviewed: 06/23/2015 Elsevier Interactive Patient Education  2017 [employer]  

## 2017-03-09 LAB — URINALYSIS, COMPLETE
Bilirubin, UA: NEGATIVE
Glucose, UA: NEGATIVE
Ketones, UA: NEGATIVE
Leukocytes, UA: NEGATIVE
Nitrite, UA: NEGATIVE
Protein, UA: NEGATIVE
RBC, UA: NEGATIVE
Specific Gravity, UA: 1.02 (ref 1.005–1.030)
Urobilinogen, Ur: 1 mg/dL (ref 0.2–1.0)
pH, UA: 6 (ref 5.0–7.5)

## 2017-03-09 LAB — MICROSCOPIC EXAMINATION
Bacteria, UA: NONE SEEN
RBC, UA: NONE SEEN /hpf (ref 0–?)
WBC, UA: NONE SEEN /hpf (ref 0–?)

## 2017-03-09 LAB — URINE CULTURE

## 2017-03-10 LAB — GC/CHLAMYDIA PROBE AMP
Chlamydia trachomatis, NAA: NEGATIVE
Neisseria gonorrhoeae by PCR: NEGATIVE

## 2017-07-14 ENCOUNTER — Ambulatory Visit: Payer: Medicare HMO

## 2017-07-18 ENCOUNTER — Encounter: Payer: Self-pay | Admitting: Family Medicine

## 2018-02-08 ENCOUNTER — Ambulatory Visit (INDEPENDENT_AMBULATORY_CARE_PROVIDER_SITE_OTHER): Payer: Medicare HMO | Admitting: Family Medicine

## 2018-02-08 ENCOUNTER — Encounter: Payer: Self-pay | Admitting: Family Medicine

## 2018-02-08 ENCOUNTER — Ambulatory Visit: Payer: Medicare HMO | Admitting: Family Medicine

## 2018-02-08 VITALS — BP 112/72 | HR 62 | Temp 97.4°F | Ht 65.0 in | Wt 259.4 lb

## 2018-02-08 DIAGNOSIS — M25562 Pain in left knee: Secondary | ICD-10-CM | POA: Diagnosis not present

## 2018-02-08 DIAGNOSIS — G8929 Other chronic pain: Secondary | ICD-10-CM

## 2018-02-08 DIAGNOSIS — R300 Dysuria: Secondary | ICD-10-CM | POA: Diagnosis not present

## 2018-02-08 LAB — URINALYSIS, COMPLETE
Bilirubin, UA: NEGATIVE
Glucose, UA: NEGATIVE
Ketones, UA: NEGATIVE
Leukocytes, UA: NEGATIVE
Nitrite, UA: NEGATIVE
Protein, UA: NEGATIVE
Specific Gravity, UA: 1.02 (ref 1.005–1.030)
Urobilinogen, Ur: 0.2 mg/dL (ref 0.2–1.0)
pH, UA: 7 (ref 5.0–7.5)

## 2018-02-08 LAB — MICROSCOPIC EXAMINATION
RBC, UA: NONE SEEN /hpf (ref 0–2)
Renal Epithel, UA: NONE SEEN /hpf

## 2018-02-08 MED ORDER — CEPHALEXIN 500 MG PO CAPS: 500.0000 mg | ORAL_CAPSULE | Freq: Four times a day (QID) | ORAL | 0 refills | Status: DC

## 2018-02-08 NOTE — Progress Notes (Signed)
BP 112/72   Pulse 62   Temp (!) 97.4 F (36.3 C) (Oral)   Ht 5\' 5"  (1.651 m)   Wt 259 lb 6.4 oz (117.7 kg)   BMI 43.17 kg/m    Subjective:    Patient ID: Derek [name], male    DOB: 02/27/1946, 72 y.o.   MRN: [id number]  HPI: Derek [name] is a 72 y.o. male presenting on 02/08/2018 for Knee Pain (feels weak and swollen, for couple months) and Urinary Tract Infection (little irritation, is sexually active)   HPI Dysuria and burning near the head of the penis Patient comes in today complaining of dysuria and burning of the head of the penis that is been going on for the past few days.  He says that he is sexually active but has been with the same partner for 2 years and does not think it is correlated to that.  He does have a history of STDs.  He denies any penile discharge or rashes or sores.  Patient denies any abdominal pain or flank pain or fevers or chills.  He says the pain or irritation in the head of his penis is there most the time but is increased when he urinates.  Left knee weakness Patient comes in complaining of left knee weakness that has been going on for at least a few months.  [facility] and [facility] did [facility] and he did it for some time and he does feel like it is improved.  He still feels like sometimes that leg wants to give out on him.  He does admit that he has arthritis in there but does not want to go for knee replacement at this point.  He denies any fevers or chills or redness or warmth.  He thinks it may be a little bit of swelling in the knee.  He says there is no pain currently in it.  Relevant past medical, surgical, family and social history reviewed and updated as indicated. Interim medical history since our last visit reviewed. Allergies and medications reviewed and updated.  Review of Systems  Constitutional: Negative for chills and fever.  Respiratory: Negative for shortness of breath and wheezing.     Cardiovascular: Negative for chest pain and leg swelling.  Gastrointestinal: Negative for abdominal pain.  Genitourinary: Positive for dysuria, frequency and penile pain. Negative for discharge, flank pain, hematuria, penile swelling, scrotal swelling, testicular pain and urgency.  Musculoskeletal: Positive for joint swelling. Negative for arthralgias, back pain and gait problem.  Skin: Negative for rash.  Neurological: Positive for weakness. Negative for numbness.  All other systems reviewed and are negative.   Per HPI unless specifically indicated above   Allergies as of 02/08/2018   No Known Allergies     Medication List        Accurate as of 02/08/18 11:16 AM. Always use your most recent med list.          cholecalciferol 1000 units tablet Commonly known as:  VITAMIN D Take 1,000 Units by mouth daily.   cyclobenzaprine 10 MG tablet Commonly known as:  FLEXERIL Take 1 tablet (10 mg total) by mouth 3 (three) times daily as needed for muscle spasms.   meloxicam 7.5 MG tablet Commonly known as:  MOBIC Take 1 tablet (7.5 mg total) by mouth daily.   omeprazole 20 MG capsule Commonly known as:  PRILOSEC Take 20 mg by mouth daily.   oxyCODONE-acetaminophen 5-325 MG  tablet Commonly known as:  ROXICET Take 1 tablet by mouth once a week.   vitamin B-12 1000 MCG tablet Commonly known as:  CYANOCOBALAMIN Take 1,000 mcg by mouth daily.          Objective:    BP 112/72   Pulse 62   Temp (!) 97.4 F (36.3 C) (Oral)   Ht 5\' 5"  (1.651 m)   Wt 259 lb 6.4 oz (117.7 kg)   BMI 43.17 kg/m   Wt Readings from Last 3 Encounters:  02/08/18 259 lb 6.4 oz (117.7 kg)  03/08/17 260 lb (117.9 kg)  11/25/16 260 lb (117.9 kg)    Physical Exam  Constitutional: He is oriented to person, place, and time. He appears well-developed and well-nourished. No distress.  Eyes: Conjunctivae are normal. No scleral icterus.  Neck: Neck supple. No thyromegaly present.  Cardiovascular:  Normal rate, regular rhythm, normal heart sounds and intact distal pulses.  No murmur heard. Pulmonary/Chest: Effort normal and breath sounds normal. No respiratory distress. He has no wheezes.  Abdominal: Soft. Bowel sounds are normal. He exhibits no distension. There is no tenderness. There is no guarding.  Musculoskeletal: Normal range of motion. He exhibits no edema.       Left knee: He exhibits effusion (Small amount of effusion). He exhibits normal range of motion, no deformity, normal alignment, no LCL laxity, normal patellar mobility, normal meniscus and no MCL laxity. No tenderness found.  Lymphadenopathy:    He has no cervical adenopathy.  Neurological: He is alert and oriented to person, place, and time. Coordination normal.  Skin: Skin is warm and dry. No rash noted. He is not diaphoretic.  Psychiatric: He has a normal mood and affect. His behavior is normal.  Nursing note and vitals reviewed.   Urinalysis: 0-5 WBCs, 0-10 epithelial cells, few bacteria, trace blood    Assessment & Plan:   Problem List Items Addressed This Visit      Other   Knee pain, chronic    Other Visit Diagnoses    Dysuria    -  Primary   Relevant Medications   cephALEXin (KEFLEX) 500 MG capsule   Other Relevant Orders   Urinalysis, Complete   Urine Culture   Chlamydia/Gonococcus/Trichomonas, NAA      Recommended continued stretching exercises and therapy, patient will do it at home and has a gym and he will go do it in the water as well.  Follow up plan: Return if symptoms worsen or fail to improve.  Counseling provided for all of the vaccine components Orders Placed This Encounter  Procedures  . Urine Culture  . Urinalysis, Complete    Arville Care, MD [facility] 02/08/2018, 11:16 AM

## 2018-02-09 LAB — CHLAMYDIA/GONOCOCCUS/TRICHOMONAS, NAA
Chlamydia by NAA: NEGATIVE
Gonococcus by NAA: NEGATIVE
Trich vag by NAA: NEGATIVE

## 2018-02-09 LAB — URINE CULTURE: Organism ID, Bacteria: NO GROWTH

## 2018-04-24 ENCOUNTER — Ambulatory Visit (INDEPENDENT_AMBULATORY_CARE_PROVIDER_SITE_OTHER): Payer: Medicare HMO | Admitting: Family Medicine

## 2018-04-24 ENCOUNTER — Encounter: Payer: Self-pay | Admitting: Family Medicine

## 2018-04-24 VITALS — BP 108/66 | HR 78 | Temp 97.9°F | Ht 65.0 in | Wt 260.4 lb

## 2018-04-24 DIAGNOSIS — Z0000 Encounter for general adult medical examination without abnormal findings: Secondary | ICD-10-CM | POA: Diagnosis not present

## 2018-04-24 NOTE — Progress Notes (Signed)
BP 108/66   Pulse 78   Temp 97.9 F (36.6 C) (Oral)   Ht _0  (1.651 m)   Wt 260 lb 6.4 oz (118.1 kg)   BMI 43.33 kg/m    Subjective:    Patient ID: Derek [name], male    DOB: 06/18/1946, 72 y.o.   MRN: [id number]  HPI: Derek [name] is a 72 y.o. male presenting on 04/24/2018 for Annual Exam   HPI Well adult exam Patient is coming in for a well adult exam and physical today.  Patient says he has been doing very well and denies any major health issues.  Takes medication for GERD but is otherwise denies any major health issues. Patient denies any chest pain, shortness of breath, headaches or vision issues, abdominal complaints, diarrhea, nausea, vomiting, or joint issues.   GERD Patient is currently on Prilosec.  She denies any major symptoms or abdominal pain or belching or burping. She denies any blood in her stool or lightheadedness or dizziness.   Relevant past medical, surgical, family and social history reviewed and updated as indicated. Interim medical history since our last visit reviewed. Allergies and medications reviewed and updated.  Review of Systems  Constitutional: Negative for chills and fever.  HENT: Negative for ear pain and tinnitus.   Eyes: Negative for pain.  Respiratory: Negative for cough, shortness of breath and wheezing.   Cardiovascular: Negative for chest pain, palpitations and leg swelling.  Gastrointestinal: Negative for abdominal pain, blood in stool, constipation and diarrhea.  Genitourinary: Negative for dysuria and hematuria.  Musculoskeletal: Negative for back pain and myalgias.  Skin: Negative for rash.  Neurological: Negative for dizziness, weakness and headaches.  Psychiatric/Behavioral: Negative for suicidal ideas.    Per HPI unless specifically indicated above   Allergies as of 04/24/2018   No Known Allergies     Medication List        Accurate as of 04/24/18  3:08 PM. Always use your most recent med list.            cholecalciferol 1000 units tablet Commonly known as:  VITAMIN D Take 1,000 Units by mouth daily.   omeprazole 20 MG capsule Commonly known as:  PRILOSEC Take 20 mg by mouth daily.   oxyCODONE-acetaminophen 5-325 MG tablet Commonly known as:  PERCOCET/ROXICET Take 1 tablet by mouth once a week.   vitamin B-12 1000 MCG tablet Commonly known as:  CYANOCOBALAMIN Take 1,000 mcg by mouth daily.          Objective:    BP 108/66   Pulse 78   Temp 97.9 F (36.6 C) (Oral)   Ht _1  (1.651 m)   Wt 260 lb 6.4 oz (118.1 kg)   BMI 43.33 kg/m   Wt Readings from Last 3 Encounters:  04/24/18 260 lb 6.4 oz (118.1 kg)  02/08/18 259 lb 6.4 oz (117.7 kg)  03/08/17 260 lb (117.9 kg)    Physical Exam  Constitutional: He is oriented to person, place, and time. He appears well-developed and well-nourished. No distress.  HENT:  Right Ear: External ear normal.  Left Ear: External ear normal.  Nose: Nose normal.  Mouth/Throat: Oropharynx is clear and moist. No oropharyngeal exudate.  Eyes: Pupils are equal, round, and reactive to light. Conjunctivae and EOM are normal. No scleral icterus.  Neck: Neck supple. No thyromegaly present.  Cardiovascular: Normal rate, regular rhythm, normal heart sounds and intact distal pulses.  No murmur heard. Pulmonary/Chest: Effort normal and breath sounds normal. No respiratory  distress. He has no wheezes.  Abdominal: Soft. Bowel sounds are normal. He exhibits no distension. There is no tenderness. There is no rebound and no guarding.  Musculoskeletal: Normal range of motion. He exhibits no edema.  Lymphadenopathy:    He has no cervical adenopathy.  Neurological: He is alert and oriented to person, place, and time. Coordination normal.  Skin: Skin is warm and dry. No rash noted. He is not diaphoretic.  Psychiatric: He has a normal mood and affect. His behavior is normal.  Vitals reviewed.   EKG: Normal sinus rhythm    Assessment & Plan:   Problem  List Items Addressed This Visit      Other   Morbid obesity ([place])    Other Visit Diagnoses    Well adult exam    -  Primary   Relevant Orders   CBC with Differential/Platelet   CMP14+EGFR   Lipid panel   EKG 12-Lead (Completed)       Follow up plan: Return in about 1 year (around 04/25/2019), or if symptoms worsen or fail to improve, for Well adult exam.  Counseling provided for all of the vaccine components Orders Placed This Encounter  Procedures  . CBC with Differential/Platelet  . CMP14+EGFR  . Lipid panel  . EKG 12-Lead    Caryl Pina, MD [facility] 04/24/2018, 3:08 PM

## 2018-05-29 ENCOUNTER — Ambulatory Visit (INDEPENDENT_AMBULATORY_CARE_PROVIDER_SITE_OTHER): Payer: 59 | Admitting: Family Medicine

## 2018-05-29 ENCOUNTER — Encounter: Payer: Self-pay | Admitting: Family Medicine

## 2018-05-29 VITALS — BP 113/69 | HR 79 | Temp 98.0°F | Ht 65.0 in | Wt 254.4 lb

## 2018-05-29 DIAGNOSIS — R300 Dysuria: Secondary | ICD-10-CM | POA: Diagnosis not present

## 2018-05-29 LAB — URINALYSIS, COMPLETE
Bilirubin, UA: NEGATIVE
Glucose, UA: NEGATIVE
Ketones, UA: NEGATIVE
Leukocytes, UA: NEGATIVE
Nitrite, UA: NEGATIVE
Protein, UA: NEGATIVE
RBC, UA: NEGATIVE
Specific Gravity, UA: 1.02 (ref 1.005–1.030)
Urobilinogen, Ur: 0.2 mg/dL (ref 0.2–1.0)
pH, UA: 7 (ref 5.0–7.5)

## 2018-05-29 LAB — MICROSCOPIC EXAMINATION
Bacteria, UA: NONE SEEN
Epithelial Cells (non renal): NONE SEEN /hpf (ref 0–10)
RBC, UA: NONE SEEN /hpf (ref 0–2)
Renal Epithel, UA: NONE SEEN /hpf
WBC, UA: NONE SEEN /hpf (ref 0–5)

## 2018-05-29 NOTE — Patient Instructions (Signed)
You urinalysis was NORMAL.  I am sending it for culture and will contact you with the results.  If nothing grows, no antibiotics are needed.  We may consider having you see a urologist if you continue to have pain.  If your symptoms get worse, please seek reevaluation.

## 2018-05-29 NOTE — Progress Notes (Signed)
Subjective: CC: dysuria PCP: Dettinger, Elige Radon, MD [id number] Rondinelli is a 72 y.o. male presenting to clinic today for:  1. Dysuria Patient reports a 3-day history of mild burning with urination.  He denies any hematuria.  He reports mild increase in frequency, urgency and pressure.  No nausea, vomiting or fevers.  No penile discharge or lesions.  Past medical history is significant for chlamydia in 2016.  He has been trying to hydrate a little more but otherwise no remedies used.   ROS: Per HPI  No Known Allergies Past Medical History:  Diagnosis Date  . GERD (gastroesophageal reflux disease)   . Hypercholesterolemia   . Obesity   . Urethritis    when he was in [employer]    Current Outpatient Medications:  .  cholecalciferol (VITAMIN D) 1000 UNITS tablet, Take 1,000 Units by mouth daily., Disp: , Rfl:  .  omeprazole (PRILOSEC) 20 MG capsule, Take 20 mg by mouth daily., Disp: , Rfl:  .  oxyCODONE-acetaminophen (ROXICET) 5-325 MG tablet, Take 1 tablet by mouth once a week., Disp: 15 tablet, Rfl: 0 .  vitamin B-12 (CYANOCOBALAMIN) 1000 MCG tablet, Take 1,000 mcg by mouth daily., Disp: , Rfl:  Social History   Socioeconomic History  . Marital status: Married    Spouse name: Not on file  . Number of children: Not on file  . Years of education: Not on file  . Highest education level: Not on file  Occupational History  . Not on file  Social Needs  . Financial resource strain: Not on file  . Food insecurity:    Worry: Not on file    Inability: Not on file  . Transportation needs:    Medical: Not on file    Non-medical: Not on file  Tobacco Use  . Smoking status: Never Smoker  . Smokeless tobacco: Never Used  Substance and Sexual Activity  . Alcohol use: No    Alcohol/week: 0.0 standard drinks  . Drug use: No  . Sexual activity: Not on file  Lifestyle  . Physical activity:    Days per week: Not on file    Minutes per session: Not on file  . Stress: Not on file    Relationships  . Social connections:    Talks on phone: Not on file    Gets together: Not on file    Attends religious service: Not on file    Active member of club or organization: Not on file    Attends meetings of clubs or organizations: Not on file    Relationship status: Not on file  . Intimate partner violence:    Fear of current or ex partner: Not on file    Emotionally abused: Not on file    Physically abused: Not on file    Forced sexual activity: Not on file  Other Topics Concern  . Not on file  Social History Narrative  . Not on file   Family History  Problem Relation Age of Onset  . Cancer Mother        breast  . Diabetes Father     Objective: Office vital signs reviewed. BP 113/69   Pulse 79   Temp 98 F (36.7 C) (Oral)   Ht 5\' 5"  (1.651 m)   Wt 254 lb 6.4 oz (115.4 kg)   BMI 42.33 kg/m   Physical Examination:  General: Awake, alert, well nourished, nontoxic. No acute distress GU: no suprapubic TTP. No CVA TTP  Assessment/ Plan: 72  y.o. male   1. Dysuria Uncertain etiology at this point.  Urinalysis negative for evidence of infection.  Because this was a clean-catch, GC/CT was not checked.  I discussed that he would have to come in early in the morning and provide a dirty catch urine.  Future order has been placed.  Reasons for return discussed.  I have also placed order for urine culture to further evaluate possible UTI. Will contact patient with results once available. - Urinalysis, Complete - Urine Culture - Chlamydia/Gonococcus/Trichomonas, NAA; Future   Orders Placed This Encounter  Procedures  . Urine Culture  . Chlamydia/Gonococcus/Trichomonas, NAA    Standing Status:   Future    Standing Expiration Date:   05/30/2019  . Urinalysis, Complete   No orders of the defined types were placed in this encounter.    Raliegh Ip, DO [facility] [phone]

## 2018-05-30 DIAGNOSIS — R300 Dysuria: Secondary | ICD-10-CM | POA: Diagnosis not present

## 2018-05-30 LAB — URINE CULTURE

## 2018-05-30 NOTE — Addendum Note (Signed)
Addended by: Prescott Gum on: 05/30/2018 03:27 PM   Modules accepted: Orders

## 2018-06-01 LAB — CHLAMYDIA/GONOCOCCUS/TRICHOMONAS, NAA
Chlamydia by NAA: NEGATIVE
Gonococcus by NAA: NEGATIVE
Trich vag by NAA: NEGATIVE

## 2018-10-10 ENCOUNTER — Ambulatory Visit (INDEPENDENT_AMBULATORY_CARE_PROVIDER_SITE_OTHER): Payer: Medicare HMO | Admitting: Family Medicine

## 2018-10-10 ENCOUNTER — Encounter: Payer: Self-pay | Admitting: Family Medicine

## 2018-10-10 VITALS — BP 123/64 | HR 56 | Temp 98.0°F | Ht 65.0 in | Wt 264.0 lb

## 2018-10-10 DIAGNOSIS — L240 Irritant contact dermatitis due to detergents: Secondary | ICD-10-CM | POA: Diagnosis not present

## 2018-10-10 MED ORDER — PREDNISONE 20 MG PO TABS: ORAL_TABLET | ORAL | 0 refills | Status: DC

## 2018-10-10 MED ORDER — CETIRIZINE HCL 10 MG PO TABS: 10.0000 mg | ORAL_TABLET | Freq: Every day | ORAL | 11 refills | Status: DC

## 2018-10-10 MED ORDER — METHYLPREDNISOLONE ACETATE 40 MG/ML IJ SUSP
40.0000 mg | Freq: Once | INTRAMUSCULAR | Status: AC
  Administered 2018-10-10: 40 mg via INTRAMUSCULAR

## 2018-10-10 MED ORDER — TRIAMCINOLONE ACETONIDE 0.1 % EX CREA: 1.0000 "application " | TOPICAL_CREAM | Freq: Two times a day (BID) | CUTANEOUS | 0 refills | Status: DC

## 2018-10-10 MED ORDER — FAMOTIDINE 20 MG PO TABS: 20.0000 mg | ORAL_TABLET | Freq: Two times a day (BID) | ORAL | 0 refills | Status: DC

## 2018-10-10 NOTE — Progress Notes (Signed)
    Subjective:     Derek [name] is a 73 y.o. male who complains of a pruritic rash. Symptoms began 14 days ago. Patient describes the rash as erythematous, scattered, maculopapular. Characteristics of rash and associated history: none. Patient's previous dermatologic history includes none. Family history of derm problems: none. Medications currently using: moisturizing cream and topical antibiotic. Environmental exposures or allergies: detergents/soaps ([employer]).  The following portions of the patient's history were reviewed and updated as appropriate: allergies, current medications, past family history, past medical history, past social history, past surgical history and problem list.   Review of Systems Constitutional: negative Eyes: negative Ears, nose, mouth, throat, and face: negative Respiratory: negative Cardiovascular: negative Integument/breast: positive for pruritus, rash and skin color change Neurological: negative Allergic/Immunologic: negative    Objective:    BP 123/64   Pulse (!) 56   Temp 98 F (36.7 C) (Oral)   Ht 5\' 5"  (1.651 m)   Wt 264 lb (119.7 kg)   BMI 43.93 kg/m  Physical Exam  General:  alert, cooperative and no distress  HEENT:  PERRLA, extra ocular movement intact, sclera clear, anicteric, oropharynx clear, no lesions, neck supple with midline trachea, thyroid without masses and trachea midline  Lymph Nodes:  Cervical, supraclavicular, and axillary nodes normal.  Lungs:  clear to auscultation bilaterally  Heart:  regular rate and rhythm, S1, S2 normal, no murmur, click, rub or gallop  Extremities:  extremities normal, atraumatic, no cyanosis or edema  Skin:   Rash located on the face, arms, hands, legs.   Shape:   raised, round   Consistency:   nontender  Color:   Slight redness  Type: macule(s) - face, arm(s) bilateral, lower leg(s) bilateral, papule(s) -face, arm(s) bilateral, lower leg(s) bilateral  Size: severalmm    The remainder of the  skin exam:  color normal, no rashes or suspicious lesions, no evidence of bleeding or bruising     Assessment:   Kaisan was seen today for rash.  Diagnoses and all orders for this visit:  Irritant contact dermatitis due to detergent Recent change in laundry detergent. Stop using this detergent. Avoid triggers. Symptomatic care discussed. Medications as prescribed. Report any new or worsening symptoms.  -     triamcinolone cream (KENALOG) 0.1 %; Apply 1 application topically 2 (two) times daily. -     methylPREDNISolone acetate (DEPO-MEDROL) injection 40 mg -     cetirizine (ZYRTEC) 10 MG tablet; Take 1 tablet (10 mg total) by mouth daily. -     famotidine (PEPCID) 20 MG tablet; Take 1 tablet (20 mg total) by mouth 2 (two) times daily for 14 days. -     predniSONE (DELTASONE) 20 MG tablet; 2 po at sametime daily for 5 days- start tomorrow     Plan:   Return if symptoms worsen or fail to improve.  The above assessment and management plan was discussed with the patient. The patient verbalized understanding of and has agreed to the management plan. Patient is aware to call the clinic if symptoms fail to improve or worsen. Patient is aware when to return to the clinic for a follow-up visit. Patient educated on when it is appropriate to go to the emergency department.   [name], FNP-C [facility] [address] [place], [place] [address] [phone]

## 2018-10-10 NOTE — Patient Instructions (Signed)
Contact Dermatitis  Dermatitis is redness, soreness, and swelling (inflammation) of the skin. Contact dermatitis is a reaction to something that touches the skin.  There are two types of contact dermatitis:   Irritant contact dermatitis. This happens when something bothers (irritates) your skin, like soap.   Allergic contact dermatitis. This is caused when you are exposed to something that you are allergic to, such as poison ivy.  What are the causes?   Common causes of irritant contact dermatitis include:  ? Makeup.  ? Soaps.  ? Detergents.  ? Bleaches.  ? Acids.  ? Metals, such as nickel.   Common causes of allergic contact dermatitis include:  ? Plants.  ? Chemicals.  ? Jewelry.  ? Latex.  ? Medicines.  ? Preservatives in products, such as clothing.  What increases the risk?   Having a job that exposes you to things that bother your skin.   Having asthma or eczema.  What are the signs or symptoms?  Symptoms may happen anywhere the irritant has touched your skin. Symptoms include:   Dry or flaky skin.   Redness.   Cracks.   Itching.   Pain or a burning feeling.   Blisters.   Blood or clear fluid draining from skin cracks.  With allergic contact dermatitis, swelling may occur. This may happen in places such as the eyelids, mouth, or genitals.  How is this treated?   This condition is treated by checking for the cause of the reaction and protecting your skin. Treatment may also include:  ? Steroid creams, ointments, or medicines.  ? Antibiotic medicines or other ointments, if you have a skin infection.  ? Lotion or medicines to help with itching.  ? A bandage (dressing).  Follow these instructions at home:  Skin care   Moisturize your skin as needed.   Put cool cloths on your skin.   Put a baking soda paste on your skin. Stir water into baking soda until it looks like a paste.   Do not scratch your skin.   Avoid having things rub up against your skin.   Avoid the use of soaps, perfumes, and  dyes.  Medicines   Take or apply over-the-counter and prescription medicines only as told by your doctor.   If you were prescribed an antibiotic medicine, take or apply it as told by your doctor. Do not stop using it even if your condition starts to get better.  Bathing   Take a bath with:  ? Epsom salts.  ? Baking soda.  ? Colloidal oatmeal.   Bathe less often.   Bathe in warm water. Avoid using hot water.  Bandage care   If you were given a bandage, change it as told by your health care provider.   Wash your hands with soap and water before and after you change your bandage. If soap and water are not available, use hand sanitizer.  General instructions   Avoid the things that caused your reaction. If you do not know what caused it, keep a journal. Write down:  ? What you eat.  ? What skin products you use.  ? What you drink.  ? What you wear in the area that has symptoms. This includes jewelry.   Check the affected areas every day for signs of infection. Check for:  ? More redness, swelling, or pain.  ? More fluid or blood.  ? Warmth.  ? Pus or a bad smell.   Keep all follow-up visits as   told by your doctor. This is important.  Contact a doctor if:   You do not get better with treatment.   Your condition gets worse.   You have signs of infection, such as:  ? More swelling.  ? Tenderness.  ? More redness.  ? Soreness.  ? Warmth.   You have a fever.   You have new symptoms.  Get help right away if:   You have a very bad headache.   You have neck pain.   Your neck is stiff.   You throw up (vomit).   You feel very sleepy.   You see red streaks coming from the area.   Your bone or joint near the area hurts after the skin has healed.   The area turns darker.   You have trouble breathing.  Summary   Dermatitis is redness, soreness, and swelling of the skin.   Symptoms may occur where the irritant has touched you.   Treatment may include medicines and skin care.   If you do not know what caused  your reaction, keep a journal.   Contact a doctor if your condition gets worse or you have signs of infection.  This information is not intended to replace advice given to you by your health care provider. Make sure you discuss any questions you have with your health care provider.  Document Released: 05/30/2009 Document Revised: 02/15/2018 Document Reviewed: 02/15/2018  Elsevier Interactive Patient Education  2019 Elsevier Inc[employer]

## 2018-10-14 DIAGNOSIS — N4889 Other specified disorders of penis: Secondary | ICD-10-CM | POA: Diagnosis not present

## 2018-10-14 DIAGNOSIS — Z7251 High risk heterosexual behavior: Secondary | ICD-10-CM | POA: Diagnosis not present

## 2018-10-14 DIAGNOSIS — Z114 Encounter for screening for human immunodeficiency virus [HIV]: Secondary | ICD-10-CM | POA: Diagnosis not present

## 2018-10-14 DIAGNOSIS — Z1159 Encounter for screening for other viral diseases: Secondary | ICD-10-CM | POA: Diagnosis not present

## 2018-10-20 ENCOUNTER — Other Ambulatory Visit: Payer: Self-pay | Admitting: Family Medicine

## 2018-10-20 DIAGNOSIS — L240 Irritant contact dermatitis due to detergents: Secondary | ICD-10-CM

## 2018-10-25 DIAGNOSIS — Z7251 High risk heterosexual behavior: Secondary | ICD-10-CM | POA: Diagnosis not present

## 2018-10-25 DIAGNOSIS — R300 Dysuria: Secondary | ICD-10-CM | POA: Diagnosis not present

## 2018-11-08 ENCOUNTER — Ambulatory Visit: Payer: Medicare HMO | Admitting: *Deleted

## 2018-11-11 DIAGNOSIS — R82998 Other abnormal findings in urine: Secondary | ICD-10-CM | POA: Diagnosis not present

## 2018-11-11 DIAGNOSIS — Z125 Encounter for screening for malignant neoplasm of prostate: Secondary | ICD-10-CM | POA: Diagnosis not present

## 2018-11-11 DIAGNOSIS — R300 Dysuria: Secondary | ICD-10-CM | POA: Diagnosis not present

## 2018-11-11 DIAGNOSIS — Z7251 High risk heterosexual behavior: Secondary | ICD-10-CM | POA: Diagnosis not present

## 2018-11-11 DIAGNOSIS — Z79899 Other long term (current) drug therapy: Secondary | ICD-10-CM | POA: Diagnosis not present

## 2018-11-22 DIAGNOSIS — A499 Bacterial infection, unspecified: Secondary | ICD-10-CM | POA: Diagnosis not present

## 2018-11-22 DIAGNOSIS — N390 Urinary tract infection, site not specified: Secondary | ICD-10-CM | POA: Diagnosis not present

## 2018-12-02 ENCOUNTER — Other Ambulatory Visit: Payer: Self-pay | Admitting: Family Medicine

## 2018-12-02 DIAGNOSIS — L240 Irritant contact dermatitis due to detergents: Secondary | ICD-10-CM

## 2018-12-09 ENCOUNTER — Other Ambulatory Visit: Payer: Self-pay | Admitting: Family Medicine

## 2018-12-09 DIAGNOSIS — L240 Irritant contact dermatitis due to detergents: Secondary | ICD-10-CM

## 2018-12-12 ENCOUNTER — Other Ambulatory Visit: Payer: Self-pay

## 2018-12-12 ENCOUNTER — Ambulatory Visit (INDEPENDENT_AMBULATORY_CARE_PROVIDER_SITE_OTHER): Payer: Medicare HMO | Admitting: *Deleted

## 2018-12-12 DIAGNOSIS — Z0000 Encounter for general adult medical examination without abnormal findings: Secondary | ICD-10-CM

## 2018-12-12 NOTE — Progress Notes (Signed)
MEDICARE ANNUAL WELLNESS VISIT  12/12/2018  Telephone Visit Disclaimer This Medicare AWV was conducted by telephone due to national recommendations for restrictions regarding the COVID-19 Pandemic (e.g. social distancing).  I verified, using two identifiers, that I am speaking with Derek [name] or their authorized healthcare agent. I discussed the limitations, risks, security, and privacy concerns of performing an evaluation and management service by telephone and the potential availability of an in-person appointment in the future. The patient expressed understanding and agreed to proceed.   Subjective:   Derek [name] is a 73 y.o. male patient of Dettinger, Elige Radon, MD who had a Medicare Annual Wellness Visit today via telephone. Derek [name] is Retired and lives alone. he has 2 children. he reports that he is socially active and does interact with friends and/or family regularly. he is moderately physically active and enjoys fishing.  Patient Care Team: Dettinger, Elige Radon, MD as PCP - General (Family Medicine)  [facility] Utilization Over the Past 12 Months: # of hospitalizations or ER visits: 0 # of surgeries: 0  Review of Systems    Patient reports that his overall health is unchanged compared to last year.  Patient Reported Readings (BP, Pulse, CBG, Weight, etc) n/a  Review of Systems: Negative except has some allergies right now but is taking his allergy medicine which seems to be helping.  All other systems negative.  Pain Assessment Pain : No/denies pain     Current Medications (verified) Allergies as of 12/12/2018   No Known Allergies     Medication List       Accurate as of December 12, 2018 11:15 AM. Always use your most recent med list.        cetirizine 10 MG tablet Commonly known as:  ZYRTEC Take 1 tablet (10 mg total) by mouth daily.   cholecalciferol 1000 units tablet Commonly known as:  VITAMIN D Take 1,000 Units by mouth daily.   famotidine 20  MG tablet Commonly known as:  PEPCID Take 1 tablet (20 mg total) by mouth 2 (two) times daily for 14 days.   omeprazole 20 MG capsule Commonly known as:  PRILOSEC Take 20 mg by mouth daily.   oxyCODONE-acetaminophen 5-325 MG tablet Commonly known as:  Roxicet Take 1 tablet by mouth once a week.   triamcinolone cream 0.1 % Commonly known as:  KENALOG APPLY 1 APPLICATION EXTERNALLY TWICE DAILY   vitamin B-12 1000 MCG tablet Commonly known as:  CYANOCOBALAMIN Take 1,000 mcg by mouth daily.       Allergies (verified) Patient has no known allergies.   History (reviewed): Past Medical History:  Diagnosis Date  . GERD (gastroesophageal reflux disease)   . Hypercholesterolemia   . Obesity   . Urethritis    when he was in [employer]   Past Surgical History:  Procedure Laterality Date  . COLONOSCOPY     Family History  Problem Relation Age of Onset  . Cancer Mother        breast  . Diabetes Father    Social History   Socioeconomic History  . Marital status: Legally Separated    Spouse name: Not on file  . Number of children: 2  . Years of education: graduated high school  . Highest education level: High school graduate  Occupational History  . Occupation: retired  Engineer, production  . Financial resource strain: Somewhat hard  . Food insecurity:    Worry: Never true    Inability: Never true  . Transportation needs:  Medical: No    Non-medical: No  Tobacco Use  . Smoking status: Never Smoker  . Smokeless tobacco: Never Used  Substance and Sexual Activity  . Alcohol use: No    Alcohol/week: 0.0 standard drinks  . Drug use: No  . Sexual activity: Yes    Birth control/protection: Condom  Lifestyle  . Physical activity:    Days per week: 7 days    Minutes per session: 60 min  . Stress: Only a little  Relationships  . Social connections:    Talks on phone: More than three times a week    Gets together: More than three times a week    Attends religious  service: More than 4 times per year    Active member of club or organization: Yes    Attends meetings of clubs or organizations: More than 4 times per year    Relationship status: Separated  Other Topics Concern  . Not on file  Social History Narrative  . Not on file    Activities of Daily Living In your present state of health, do you have any difficulty performing the following activities: 12/12/2018  Hearing? Y  Comment has hearing aids from [place] and is able to hear with them  Vision? Y  Comment pt says he has trouble seeing close up like when reading newspaper-uses reading glasses right now  Difficulty concentrating or making decisions? N  Walking or climbing stairs? N  Dressing or bathing? N  Doing errands, shopping? N  Preparing Food and eating ? N  Using the Toilet? N  In the past six months, have you accidently leaked urine? N  Do you have problems with loss of bowel control? N  Managing your Medications? N  Managing your Finances? N  Housekeeping or managing your Housekeeping? 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 0 0 No No No  Number falls in past yr: - - - - -  Injury with Fall? - - - - -     Objective:      Last 3 BP Readings Last Weight Last BMI  BP Readings from Last 3 Encounters:  10/10/18 123/64  05/29/18 113/69  04/24/18 108/66   Wt Readings from Last 3 Encounters:  10/10/18 264 lb (119.7 kg)  05/29/18 254 lb 6.4 oz (115.4 kg)  04/24/18 260 lb 6.4 oz (118.1 kg)   BMI Readings from Last 1 Encounters:  10/10/18 43.93 kg/m    *Unable to obtain current vital signs, weight, and BMI due to telephone visit type  Derek [name] seemed alert and oriented and he participated appropriately during our telephone  visit.  Advanced Directives 12/12/2018 11/15/2015 04/19/2014  Does Patient Have a Medical Advance Directive? No No No  Would patient like information on creating a medical advance directive? Yes (MAU/Ambulatory/Procedural Areas - Information given) No - patient declined information -    Hearing/Vision  . Derek [name] did not seem to have difficulty with hearing/understanding during the telephone conversation . Reports that he has not had a formal eye exam by an eye care professional within the past year . Reports that he has had a formal hearing evaluation within the past year *Unable to fully assess hearing and vision during telephone visit type  Cognitive Function: 6CIT Screen 12/12/2018  What Year? 0 points  What month? 0 points  What time? 0 points  Count back from 20 0 points  Months in reverse 4 points  Repeat phrase 0 points  Total Score 4    Normal Cognitive Function Screening: Yes (Normal:0-7, Significant for Dysfunction: >8)  Immunization & Health Maintenance Record Immunization History  Administered Date(s) Administered  . Influenza,inj,Quad PF,6+ Mos 06/25/2014, 06/15/2016  . Influenza-Unspecified 06/16/2013  . Tdap 09/29/2011    Health Maintenance  Topic Date Due  . PNA vac Low Risk Adult (1 of 2 - PCV13) 08/20/2012  . INFLUENZA VACCINE  03/17/2019  .  TETANUS/TDAP  09/28/2021  . COLONOSCOPY  02/28/2022  . Hepatitis C Screening  Completed       Assessment:   This is a routine wellness examination for Derek [name].  Health Maintenance Due  Health Maintenance Due  Topic Date Due  . PNA vac Low Risk Adult (1 of 2 - PCV13) 08/20/2012     Plan:    Personalized Goals Goals Addressed            This Visit's Progress   . 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 He would like to spend more time doing things he enjoys such as fishing.      Personalized Health Maintenance & Screening Recommendations  Pneumococcal vaccine , Shingles Vaccine  Lung Cancer Screening Recommended: not applicable (Low Dose CT Chest recommended if Age 79-80 years, 30 pack-year currently smoking OR have quit w/in past 15 years) Hepatitis C Screening recommended: no HIV Screening recommended: no  Advanced Directives: Written information [place] [facility] [place] [facility] [place] and he is working on completing it and will give us a copy once completed.  Follow-up Plan . Follow-up with Dettinger, Elige RadonJoshua A, MD as planned . Schedule eye exam  . Will schedule nurse appt for Prevnar  I have personally reviewed and noted the following in the patient's chart:   . Medical and social history . Use of alcohol, tobacco or illicit drugs  . Current medications and supplements . Functional ability and status . Nutritional status . Physical activity . Advanced directives . List of other physicians . Hospitalizations, surgeries, and ER visits in previous 12 months . Vitals . Screenings to include cognitive, depression, and falls . Referrals and appointments  In addition, I have reviewed and discussed with Derek Milourtis Fulgham certain preventive protocols, quality metrics, and best practice recommendations. A written personalized care plan for preventive services as  well as general preventive health recommendations is available and can be mailed to the patient at his request.       signature  12/12/2018

## 2018-12-27 ENCOUNTER — Other Ambulatory Visit: Payer: Self-pay | Admitting: *Deleted

## 2019-01-22 ENCOUNTER — Ambulatory Visit: Payer: Medicare HMO | Admitting: *Deleted

## 2019-05-25 ENCOUNTER — Other Ambulatory Visit: Payer: Self-pay

## 2019-05-25 ENCOUNTER — Ambulatory Visit (INDEPENDENT_AMBULATORY_CARE_PROVIDER_SITE_OTHER): Payer: Medicare HMO

## 2019-05-25 DIAGNOSIS — Z23 Encounter for immunization: Secondary | ICD-10-CM

## 2019-12-19 ENCOUNTER — Ambulatory Visit (INDEPENDENT_AMBULATORY_CARE_PROVIDER_SITE_OTHER): Payer: Medicare HMO

## 2019-12-19 DIAGNOSIS — Z0000 Encounter for general adult medical examination without abnormal findings: Secondary | ICD-10-CM | POA: Diagnosis not present

## 2019-12-19 NOTE — Patient Instructions (Signed)
  MEDICARE ANNUAL WELLNESS VISIT Health Maintenance Summary and Written Plan of Care  [name] ,  Thank you for allowing me to perform your Medicare Annual Wellness Visit and for your ongoing commitment to your health.   Health Maintenance & Immunization History Health Maintenance  Topic Date Due  . COVID-19 Vaccine (1) Never done  . INFLUENZA VACCINE  03/16/2020  . PNA vac Low Risk Adult (2 of 2 - PPSV23) 05/24/2020  . TETANUS/TDAP  09/28/2021  . COLONOSCOPY  02/28/2022  . Hepatitis C Screening  Completed   Immunization History  Administered Date(s) Administered  . Fluad Quad(high Dose 65+) 05/25/2019  . Influenza,inj,Quad PF,6+ Mos 06/25/2014, 06/15/2016  . Influenza-Unspecified 06/16/2013  . Pneumococcal Conjugate-13 05/25/2019  . Tdap 09/29/2011    These are the patient goals that we discussed: Goals Addressed            This Visit's Progress   . Client will verbalize understanding of resources for managing BMI       . Exercise 3x per week (30 min per time)      . Prevent falls          This is a list of Health Maintenance Items that are overdue or due now: Health Maintenance Due  Topic Date Due  . COVID-19 Vaccine (1) Never done     Orders/Referrals Placed Today: No orders of the defined types were placed in this encounter.  (Contact our referral department at [phone] if you have not spoken with someone about your referral appointment within the next 5 days)    Follow-up Plan  As needed with Dr. Louanne Skye. However, please contact the office if you do not get a referral from the [place] for your left knee replacement.

## 2019-12-19 NOTE — Progress Notes (Signed)
MEDICARE ANNUAL WELLNESS VISIT  12/19/2019  Telephone Visit Disclaimer This Medicare AWV was conducted by telephone due to national recommendations for restrictions regarding the COVID-19 Pandemic (e.g. social distancing).  I verified, using two identifiers, that I am speaking with Derek [name] or their authorized healthcare agent. I discussed the limitations, risks, security, and privacy concerns of performing an evaluation and management service by telephone and the potential availability of an in-person appointment in the future. The patient expressed understanding and agreed to proceed.   Subjective:  Derek [name] is a 74 y.o. male patient of Dettinger, Fransisca Kaufmann, MD who had a Medicare Annual Wellness Visit today via telephone. Derek [name] is Retired and lives alone. he has two children. he reports that he is socially active and does interact with friends/family regularly. he is minimally physically active and enjoys "tinkering/fixing things".  Patient Care Team: Dettinger, Fransisca Kaufmann, MD as PCP - General (Family Medicine)  Advanced Directives 12/19/2019 12/12/2018 11/15/2015 04/19/2014  Does Patient Have a Medical Advance Directive? Yes No No No  Type of Advance Directive Living will - - -  Does patient want to make changes to medical advance directive? No - Patient declined - - -  Would patient like information on creating a medical advance directive? - Yes (MAU/Ambulatory/Procedural Areas - Information given) No - patient declined information -    Hospital Utilization Over the Past 12 Months: # of hospitalizations or ER visits: 0 # of surgeries: 0  Review of Systems    Patient reports that his overall health is unchanged compared to last year.   Patient Reported Readings (BP, Pulse, CBG, Weight, etc) {NONE  Pain Assessment Pain : No/denies pain     Current Medications & Allergies (verified) Allergies as of 12/19/2019   No Known Allergies     Medication List       Accurate  as of Dec 19, 2019 10:36 AM. If you have any questions, ask your nurse or doctor.        STOP taking these medications   famotidine 20 MG tablet Commonly known as: PEPCID   triamcinolone cream 0.1 % Commonly known as: KENALOG     TAKE these medications   cetirizine 10 MG tablet Commonly known as: ZYRTEC Take 1 tablet (10 mg total) by mouth daily.   cholecalciferol 1000 units tablet Commonly known as: VITAMIN D Take 1,000 Units by mouth daily.   omeprazole 20 MG capsule Commonly known as: PRILOSEC Take 20 mg by mouth daily.   vitamin B-12 1000 MCG tablet Commonly known as: CYANOCOBALAMIN Take 1,000 mcg by mouth daily.       History (reviewed): Past Medical History:  Diagnosis Date  . GERD (gastroesophageal reflux disease)   . Hypercholesterolemia   . Obesity   . Urethritis    when he was in [employer]   Past Surgical History:  Procedure Laterality Date  . COLONOSCOPY     Family History  Problem Relation Age of Onset  . Cancer Mother        breast  . Diabetes Father    Social History   Socioeconomic History  . Marital status: Legally Separated    Spouse name: Not on file  . Number of children: 2  . Years of education: graduated high school  . Highest education level: High school graduate  Occupational History  . Occupation: retired  Tobacco Use  . Smoking status: Never Smoker  . Smokeless tobacco: Never Used  Substance and Sexual Activity  .  Alcohol use: No    Alcohol/week: 0.0 standard drinks  . Drug use: No  . Sexual activity: Yes    Birth control/protection: Condom  Other Topics Concern  . Not on file  Social History Narrative  . Not on file   Social Determinants of Health   Financial Resource Strain:   . Difficulty of Paying Living Expenses:   Food Insecurity:   . Worried About Programme researcher, broadcasting/film/video in the Last Year:   . Barista in the Last Year:   Transportation Needs:   . Freight forwarder (Medical):   Marland Kitchen Lack of  Transportation (Non-Medical):   Physical Activity:   . Days of Exercise per Week:   . Minutes of Exercise per Session:   Stress:   . Feeling of Stress :   Social Connections:   . Frequency of Communication with Friends and Family:   . Frequency of Social Gatherings with Friends and Family:   . Attends Religious Services:   . Active Member of Clubs or Organizations:   . Attends Banker Meetings:   Marland Kitchen Marital Status:     Activities of Daily Living In your present state of health, do you have any difficulty performing the following activities: 12/19/2019  Hearing? Y  Comment Bilateral hearing aids  Vision? N  Difficulty concentrating or making decisions? N  Walking or climbing stairs? Y  Dressing or bathing? N  Doing errands, shopping? N  Preparing Food and eating ? N  Using the Toilet? N  In the past six months, have you accidently leaked urine? N  Do you have problems with loss of bowel control? N  Managing your Medications? N  Managing your Finances? N  Housekeeping or managing your Housekeeping? N  Some recent data might be hidden    Patient Education/ Literacy How often do you need to have someone help you when you read instructions, pamphlets, or other written materials from your doctor or pharmacy?: 2 - Rarely What is the last grade level you completed in school?: 12th grade  Exercise Current Exercise Habits: The patient does not participate in regular exercise at present, Exercise limited by: orthopedic condition(s)  Diet Patient reports consuming 2 meals a day and 1 snack(s) a day Patient reports that his primary diet is: low carb, low sugar. Increased veggies and fruits. Patient reports that she does have regular access to food.   Depression Screen PHQ 2/9 Scores 12/19/2019 12/12/2018 10/10/2018 05/29/2018 04/24/2018 02/08/2018 03/08/2017  PHQ - 2 Score 0 0 0 0 0 0 0     Fall Risk Fall Risk  12/19/2019 12/12/2018 10/10/2018 05/29/2018 04/24/2018  Falls in the  past year? 1 0 0 No No  Number falls in past yr: 1 - - - -  Injury with Fall? 0 - - - -  Risk for fall due to : Orthopedic patient;Impaired mobility;Impaired balance/gait - - - -  Follow up Falls evaluation completed - - - -     Objective:  Derek [name] seemed alert and oriented and he participated appropriately during our telephone visit.  Blood Pressure Weight BMI  BP Readings from Last 3 Encounters:  10/10/18 123/64  05/29/18 113/69  04/24/18 108/66   Wt Readings from Last 3 Encounters:  10/10/18 264 lb (119.7 kg)  05/29/18 254 lb 6.4 oz (115.4 kg)  04/24/18 260 lb 6.4 oz (118.1 kg)   BMI Readings from Last 1 Encounters:  10/10/18 43.93 kg/m    *Unable to obtain  current vital signs, weight, and BMI due to telephone visit type  Hearing/Vision  . Kimm did not seem to have difficulty with hearing/understanding during the telephone conversation . Reports that he has not had a formal eye exam by an eye care professional within the past year . Reports that he has had a formal hearing evaluation within the past year *Unable to fully assess hearing and vision during telephone visit type  Cognitive Function: 6CIT Screen 12/19/2019 12/12/2018  What Year? 0 points 0 points  What month? 0 points 0 points  What time? 3 points 0 points  Count back from 20 0 points 0 points  Months in reverse 4 points 4 points  Repeat phrase 2 points 0 points  Total Score 9 4   (Normal:0-7, Significant for Dysfunction: >8)  Normal Cognitive Function Screening: Yes   Immunization & Health Maintenance Record Immunization History  Administered Date(s) Administered  . Fluad Quad(high Dose 65+) 05/25/2019  . Influenza,inj,Quad PF,6+ Mos 06/25/2014, 06/15/2016  . Influenza-Unspecified 06/16/2013  . Pneumococcal Conjugate-13 05/25/2019  . Tdap 09/29/2011    Health Maintenance  Topic Date Due  . COVID-19 Vaccine (1) Never done  . INFLUENZA VACCINE  03/16/2020  . PNA vac Low Risk Adult (2 of 2  - PPSV23) 05/24/2020  . TETANUS/TDAP  09/28/2021  . COLONOSCOPY  02/28/2022  . Hepatitis C Screening  Completed       Assessment  This is a routine wellness examination for [facility]  Health Maintenance: Due or Overdue Health Maintenance Due  Topic Date Due  . COVID-19 Vaccine (1) Never done    Derek [name] does not need a referral for Community Assistance: Care Management:   no Social Work:    no Prescription Assistance:  no Nutrition/Diabetes Education:  no   Plan:  Personalized Goals Goals Addressed            This Visit's Progress   . Client will verbalize understanding of resources for managing BMI       . Exercise 3x per week (30 min per time)      . Prevent falls        Personalized Health Maintenance & Screening Recommendations   Lung Cancer Screening Recommended: no (Low Dose CT Chest recommended if Age 82-80 years, 30 pack-year currently smoking OR have quit w/in past 15 years) Hepatitis C Screening recommended: no HIV Screening recommended: no  Advanced Directives: Written information was not prepared per patient's request.  Referrals & Orders No orders of the defined types were placed in this encounter.   Follow-up Plan . Follow-up with Dettinger, Elige Radon, MD as planned    I have personally reviewed and noted the following in the patient's chart:   . Medical and social history . Use of alcohol, tobacco or illicit drugs  . Current medications and supplements . Functional ability and status . Nutritional status . Physical activity . Advanced directives . List of other physicians . Hospitalizations, surgeries, and ER visits in previous 12 months . Vitals . Screenings to include cognitive, depression, and falls . Referrals and appointments  In addition, I have reviewed and discussed with Derek [name] certain preventive protocols, quality metrics, and best practice recommendations. A written personalized care plan for preventive  services as well as general preventive health recommendations is available and can be mailed to the patient at his request.      [facility]  03/20/276

## 2020-03-03 ENCOUNTER — Encounter: Payer: Self-pay | Admitting: Family Medicine

## 2020-03-03 ENCOUNTER — Ambulatory Visit (INDEPENDENT_AMBULATORY_CARE_PROVIDER_SITE_OTHER): Payer: Medicare HMO | Admitting: Family Medicine

## 2020-03-03 ENCOUNTER — Other Ambulatory Visit: Payer: Self-pay

## 2020-03-03 VITALS — BP 92/64 | HR 80 | Temp 97.8°F | Ht 65.0 in | Wt 258.0 lb

## 2020-03-03 DIAGNOSIS — M1712 Unilateral primary osteoarthritis, left knee: Secondary | ICD-10-CM | POA: Diagnosis not present

## 2020-03-03 NOTE — Progress Notes (Signed)
BP 92/64   Pulse 80   Temp 97.8 F (36.6 C)   Ht 5\' 5"  (1.651 m)   Wt 258 lb (117 kg)   SpO2 97%   BMI 42.93 kg/m    Subjective:   Patient ID: Derek [name], male    DOB: 22-Jul-1946, 74 y.o.   MRN: [id number]  HPI: Derek [name] is a 74 y.o. male presenting on 03/03/2020 for weakness left knee   HPI Patient is coming in complaining of left knee pain and weakness and giving way and swelling that is been going on for several years but has worsened over the past year.  He says that it has been giving out a lot more on him and he has been getting weaker on that knee.  Patient sees the 03/05/2020 as his primary so he gets blood work through there but he says it was taking too long to get to an orthopedic through the [place] and wants to see somebody outside of the [place].  He says he is multiple x-rays through the [place] of that knee recently.  Patient denies any fevers or chills or redness or warmth.  Relevant past medical, surgical, family and social history reviewed and updated as indicated. Interim medical history since our last visit reviewed. Allergies and medications reviewed and updated.  Review of Systems  Constitutional: Negative for chills and fever.  Respiratory: Negative for shortness of breath and wheezing.   Cardiovascular: Negative for chest pain and leg swelling.  Musculoskeletal: Positive for arthralgias and joint swelling. Negative for back pain, gait problem and myalgias.  Skin: Negative for rash.  Neurological: Positive for weakness. Negative for light-headedness and numbness.  All other systems reviewed and are negative.   Per HPI unless specifically indicated above   Allergies as of 03/03/2020   No Known Allergies     Medication List       Accurate as of March 03, 2020 12:02 PM. If you have any questions, ask your nurse or doctor.        cetirizine 10 MG tablet Commonly known as: ZYRTEC Take 1 tablet (10 mg total) by mouth daily.   cholecalciferol 1000 units  tablet Commonly known as: VITAMIN D Take 1,000 Units by mouth daily.   omeprazole 20 MG capsule Commonly known as: PRILOSEC Take 20 mg by mouth daily.   vitamin B-12 1000 MCG tablet Commonly known as: CYANOCOBALAMIN Take 1,000 mcg by mouth daily.        Objective:   BP 92/64   Pulse 80   Temp 97.8 F (36.6 C)   Ht 5\' 5"  (1.651 m)   Wt 258 lb (117 kg)   SpO2 97%   BMI 42.93 kg/m   Wt Readings from Last 3 Encounters:  03/03/20 258 lb (117 kg)  10/10/18 264 lb (119.7 kg)  05/29/18 254 lb 6.4 oz (115.4 kg)    Physical Exam Vitals and nursing note reviewed.  Constitutional:      General: He is not in acute distress.    Appearance: He is well-developed. He is not diaphoretic.  Eyes:     General: No scleral icterus.    Conjunctiva/sclera: Conjunctivae normal.  Neck:     Thyroid: No thyromegaly.  Musculoskeletal:        General: Normal range of motion.     Left knee: Crepitus present. No deformity or erythema. Normal range of motion. Tenderness present over the medial joint line. No lateral joint line, MCL, LCL, ACL or PCL tenderness. Normal  meniscus. Normal pulse.  Skin:    General: Skin is warm and dry.     Findings: No rash.  Neurological:     Mental Status: He is alert and oriented to person, place, and time.     Coordination: Coordination normal.  Psychiatric:        Behavior: Behavior normal.     Patient says he just had x-rays not too long ago through the [place], will do record request.  Assessment & Plan:   Problem List Items Addressed This Visit    None    Visit Diagnoses    Primary osteoarthritis of left knee    -  Primary   Relevant Orders   Ambulatory referral to Orthopedic Surgery      Patient also says he is done physical therapy and continues to do the exercises at home but he still feels like his left knee is just been getting worse and swelling and giving out and weakness. Follow up plan: Return if symptoms worsen or fail to  improve.  Counseling provided for all of the vaccine components No orders of the defined types were placed in this encounter.   Arville Care, MD [facility] 03/03/2020, 12:02 PM

## 2020-03-24 ENCOUNTER — Ambulatory Visit (INDEPENDENT_AMBULATORY_CARE_PROVIDER_SITE_OTHER): Payer: Medicare HMO | Admitting: Orthopaedic Surgery

## 2020-03-24 ENCOUNTER — Ambulatory Visit (INDEPENDENT_AMBULATORY_CARE_PROVIDER_SITE_OTHER): Payer: Medicare HMO

## 2020-03-24 VITALS — Ht 65.0 in | Wt 258.0 lb

## 2020-03-24 DIAGNOSIS — G8929 Other chronic pain: Secondary | ICD-10-CM

## 2020-03-24 DIAGNOSIS — M1712 Unilateral primary osteoarthritis, left knee: Secondary | ICD-10-CM | POA: Insufficient documentation

## 2020-03-24 DIAGNOSIS — M25562 Pain in left knee: Secondary | ICD-10-CM

## 2020-03-24 NOTE — Progress Notes (Signed)
Office Visit Note   Patient: Derek [name]           Date of Birth: Aug 23, 1945           MRN: [id number] Visit Date: 03/24/2020              Requested by: Dettinger, Elige Radon, MD [address] [place],  [place] [address] PCP: Dettinger, Elige Radon, MD   Assessment & Plan: Visit Diagnoses:  1. Chronic pain of left knee   2. Unilateral primary osteoarthritis, left knee     Plan: We did talk about knee replacement surgery in the future given the severe arthritis in his left knee and the failure conservative treatment.  I would like him to try to lose just a little bit more weight.  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  I would like to see him back in 6 weeks with a repeat weight and BMI calculation.  At that point we will likely work on scheduling him for knee replacement surgery if that is what he will like to proceed with.  All questions and concerns were answered and addressed.  Follow-Up Instructions: Return in about 6 weeks (around 05/05/2020).   Orders:  Orders Placed This Encounter  Procedures  . XR Knee 1-2 Views Left   No orders of the defined types were placed in this encounter.     Procedures: No procedures performed   Clinical Data: No additional findings.   Subjective: Chief Complaint  Patient presents with  . Left Knee - Pain  The patient's mom that have seen for several years now.  It has been several years since I saw him last and provided a steroid injection in his left knee to treat the pain from osteoarthritis.  He says the injection lasted only about a month.  He says the knee does give out on him on the left side.  He gets very stiff if he is been sitting for a while and gets up to walk.  He is using a walker to get around.  He is interested in knee replacement surgery at some point.  The weight listed today for him is 2 and 58 pounds with a BMI of almost 43.  However he states that he is certainly lower than that.  He is  also a Cytogeneticist.  He denies being a diabetic.  He denies any acute changes in medical status.  HPI  Review of Systems He currently denies any headache, chest pain, shortness of breath, fever, chills, nausea, vomiting  Objective: Vital Signs: Ht 5\' 5"  (1.651 m)   Wt 258 lb (117 kg)   BMI 42.93 kg/m   Physical Exam He is alert and orient x3 and in no acute distress Ortho Exam Examination of his left knee shows good range of motion.  There is medial joint line tenderness with varus malalignment and significant patellofemoral crepitation. Specialty Comments:  No specialty comments available.  Imaging: XR Knee 1-2 Views Left  Result Date: 03/24/2020 2 views of left knee show tricompartmental arthritic changes with varus malalignment, significant medial joint space narrowing, significant patellofemoral disease and periarticular osteophytes.    PMFS History: Patient Active Problem List   Diagnosis Date Noted  . Unilateral primary osteoarthritis, left knee 03/24/2020  . Knee pain, chronic 06/15/2016  . GERD (gastroesophageal reflux disease)   . Morbid obesity (HCC)   . Urethritis 03/20/2013   Past Medical History:  Diagnosis Date  .  GERD (gastroesophageal reflux disease)   . Hypercholesterolemia   . Obesity   . Urethritis    when he was in [employer]    Family History  Problem Relation Age of Onset  . Cancer Mother        breast  . Diabetes Father     Past Surgical History:  Procedure Laterality Date  . COLONOSCOPY     Social History   Occupational History  . Occupation: retired  Tobacco Use  . Smoking status: Never Smoker  . Smokeless tobacco: Never Used  Vaping Use  . Vaping Use: Never used  Substance and Sexual Activity  . Alcohol use: No    Alcohol/week: 0.0 standard drinks  . Drug use: No  . Sexual activity: Yes    Birth control/protection: Condom

## 2020-05-05 ENCOUNTER — Encounter: Payer: Self-pay | Admitting: Orthopaedic Surgery

## 2020-05-05 ENCOUNTER — Ambulatory Visit (INDEPENDENT_AMBULATORY_CARE_PROVIDER_SITE_OTHER): Payer: Medicare HMO | Admitting: Orthopaedic Surgery

## 2020-05-05 VITALS — Ht 65.0 in | Wt 250.0 lb

## 2020-05-05 DIAGNOSIS — G8929 Other chronic pain: Secondary | ICD-10-CM

## 2020-05-05 DIAGNOSIS — M25562 Pain in left knee: Secondary | ICD-10-CM | POA: Diagnosis not present

## 2020-05-05 DIAGNOSIS — M1712 Unilateral primary osteoarthritis, left knee: Secondary | ICD-10-CM

## 2020-05-05 NOTE — Progress Notes (Signed)
The patient 74 year old veteran well-known to me.  He has debilitating arthritis involving his left knee.  He has been working on weight loss and had been as high as a BMI of around 46.  Today his BMI is down to 41.  He ambulates with a walker.  He has known severe end-stage arthritis involving his left knee.  His hemoglobin A1c also runs in a good range of the standpoint.  We have talked about knee replacement surgery.  I feel that he is now lost the appropriate amount of weight and continues to lose weight that we can go ahead and set him up for knee replacement surgery.  We had a long thorough discussion about his left knee and about the surgery.  Examination of his left knee does show mild effusion.  There is medial joint line tenderness and lateral tenderness with varus malalignment.  There is significant patellofemoral arthritic changes.  X-rays of the left knee reviewed again show end-stage arthritis with varus malalignment.  He has seen a knee model and we explained in detail what the surgery involves including the risk and benefits of surgery with a long thorough discussion about what to expect with his interoperative and postoperative course.  All question concerns were answered and addressed.  Once we are allowed to perform inpatient surgery we will get this scheduled.  I would like him to at least stay overnight given his comorbidities and weight.

## 2020-05-21 ENCOUNTER — Ambulatory Visit (INDEPENDENT_AMBULATORY_CARE_PROVIDER_SITE_OTHER): Payer: Medicare HMO | Admitting: Nurse Practitioner

## 2020-05-21 DIAGNOSIS — W57XXXA Bitten or stung by nonvenomous insect and other nonvenomous arthropods, initial encounter: Secondary | ICD-10-CM | POA: Diagnosis not present

## 2020-05-21 DIAGNOSIS — S50862A Insect bite (nonvenomous) of left forearm, initial encounter: Secondary | ICD-10-CM | POA: Diagnosis not present

## 2020-05-21 MED ORDER — CEPHALEXIN 500 MG PO CAPS: 500.0000 mg | ORAL_CAPSULE | Freq: Three times a day (TID) | ORAL | 0 refills | Status: DC

## 2020-05-21 NOTE — Progress Notes (Signed)
Virtual Visit via telephone Note Due to COVID-19 pandemic this visit was conducted virtually. This visit type was conducted due to national recommendations for restrictions regarding the COVID-19 Pandemic (e.g. social distancing, sheltering in place) in an effort to limit this patient's exposure and mitigate transmission in our community. All issues noted in this document were discussed and addressed.  A physical exam was not performed with this format.  I connected with Derek [name] on 05/21/20 at 1:40 by telephone and verified that I am speaking with the correct person using two identifiers. Derek [name] is currently located at home and no one is currently with her during visit. The provider, [name]-Margaret Daphine Deutscher, FNP is located in their office at time of visit.  I discussed the limitations, risks, security and privacy concerns of performing an evaluation and management service by telephone and the availability of in person appointments. I also discussed with the patient that there may be a patient responsible charge related to this service. The patient expressed understanding and agreed to proceed.   History and Present Illness:   Chief Complaint: Insect Bite   HPI Patient calls in for telephone appointment. He thinks he got bit by a spider 3 months ago on left lower arm and are is not healing. Slightly red and has gotten better. He has been putting alcohol and lotion on it but it just wont get well.   Review of Systems  Constitutional: Negative for diaphoresis and weight loss.  Eyes: Negative for blurred vision, double vision and pain.  Respiratory: Negative for shortness of breath.   Cardiovascular: Negative for chest pain, palpitations, orthopnea and leg swelling.  Gastrointestinal: Negative for abdominal pain.  Skin: Negative for rash.  Neurological: Negative for dizziness, sensory change, loss of consciousness, weakness and headaches.  Endo/Heme/Allergies: Negative for  polydipsia. Does not bruise/bleed easily.  Psychiatric/Behavioral: Negative for memory loss. The patient does not have insomnia.   All other systems reviewed and are negative.    Observations/Objective: Alert and oriented- answers all questions appropriately No distress Erythema raised annular area the size of a quater   Assessment and Plan: Derek [name] in today with chief complaint of Insect Bite   1. Insect bite of left forearm, initial encounter Keep clean and dry Do not pick or scratch Meds ordered this encounter  Medications  . cephALEXin (KEFLEX) 500 MG capsule    Sig: Take 1 capsule (500 mg total) by mouth 3 (three) times daily.    Dispense:  21 capsule    Refill:  0    Order Specific Question:   Supervising Provider    Answer:   Arville Care A [[id number]]        Follow Up Instructions: prn    I discussed the assessment and treatment plan with the patient. The patient was provided an opportunity to ask questions and all were answered. The patient agreed with the plan and demonstrated an understanding of the instructions.   The patient was advised to call back or seek an in-person evaluation if the symptoms worsen or if the condition fails to improve as anticipated.  The above assessment and management plan was discussed with the patient. The patient verbalized understanding of and has agreed to the management plan. Patient is aware to call the clinic if symptoms persist or worsen. Patient is aware when to return to the clinic for a follow-up visit. Patient educated on when it is appropriate to go to the emergency department.   Time call ended:  1:57  I provided 17 minutes of non-face-to-face time during this encounter.    [name]-Margaret Daphine Deutscher, FNP

## 2020-06-03 ENCOUNTER — Encounter: Payer: Self-pay | Admitting: Family Medicine

## 2020-06-03 ENCOUNTER — Other Ambulatory Visit: Payer: Self-pay

## 2020-06-03 ENCOUNTER — Ambulatory Visit (INDEPENDENT_AMBULATORY_CARE_PROVIDER_SITE_OTHER): Payer: Medicare HMO | Admitting: Family Medicine

## 2020-06-03 VITALS — BP 107/65 | HR 92 | Temp 98.3°F | Resp 20

## 2020-06-03 DIAGNOSIS — R21 Rash and other nonspecific skin eruption: Secondary | ICD-10-CM

## 2020-06-03 MED ORDER — CLOTRIMAZOLE-BETAMETHASONE 1-0.05 % EX CREA: 1.0000 "application " | TOPICAL_CREAM | Freq: Two times a day (BID) | CUTANEOUS | 0 refills | Status: DC

## 2020-06-03 NOTE — Progress Notes (Signed)
Subjective: CC: rash follow up PCP: Derek, Derek Radon, MD  QIO:[id number] [name] is a 74 y.o. male presenting to clinic today for:  1. Rash Derek [name] reports a rash on left forearm for about 4 months. He reports that it started as as one small bump, then he started getting more bumps around it. He thought if was from an insect bite. He had a telephone visit on 05/21/20 and was started on Keflex. He has 2 doses of keflex left. He has also been using Jergen's lotion. Reports that this did help, but it then starting getting worse again. He denies tenderness, pain, itching, fever, warmth, or drainage.   Relevant past medical, surgical, family, and social history reviewed and updated as indicated.  Allergies and medications reviewed and updated.  No Known Allergies Past Medical History:  Diagnosis Date  . GERD (gastroesophageal reflux disease)   . Hypercholesterolemia   . Obesity   . Urethritis    when he was in [employer]    Current Outpatient Medications:  .  cephALEXin (KEFLEX) 500 MG capsule, Take 1 capsule (500 mg total) by mouth 3 (three) times daily., Disp: 21 capsule, Rfl: 0 .  cetirizine (ZYRTEC) 10 MG tablet, Take 1 tablet (10 mg total) by mouth daily., Disp: 30 tablet, Rfl: 11 .  cholecalciferol (VITAMIN D) 1000 UNITS tablet, Take 1,000 Units by mouth daily., Disp: , Rfl:  .  omeprazole (PRILOSEC) 20 MG capsule, Take 20 mg by mouth daily., Disp: , Rfl:  .  vitamin B-12 (CYANOCOBALAMIN) 1000 MCG tablet, Take 1,000 mcg by mouth daily., Disp: , Rfl:  Social History   Socioeconomic History  . Marital status: Legally Separated    Spouse name: Not on file  . Number of children: 2  . Years of education: graduated high school  . Highest education level: High school graduate  Occupational History  . Occupation: retired  Tobacco Use  . Smoking status: Never Smoker  . Smokeless tobacco: Never Used  Vaping Use  . Vaping Use: Never used  Substance and Sexual Activity  . Alcohol use:  No    Alcohol/week: 0.0 standard drinks  . Drug use: No  . Sexual activity: Yes    Birth control/protection: Condom  Other Topics Concern  . Not on file  Social History Narrative  . Not on file   Social Determinants of Health   Financial Resource Strain:   . Difficulty of Paying Living Expenses: Not on file  Food Insecurity:   . Worried About Programme researcher, broadcasting/film/video in the Last Year: Not on file  . Ran Out of Food in the Last Year: Not on file  Transportation Needs:   . Lack of Transportation (Medical): Not on file  . Lack of Transportation (Non-Medical): Not on file  Physical Activity:   . Days of Exercise per Week: Not on file  . Minutes of Exercise per Session: Not on file  Stress:   . Feeling of Stress : Not on file  Social Connections:   . Frequency of Communication with Friends and Family: Not on file  . Frequency of Social Gatherings with Friends and Family: Not on file  . Attends Religious Services: Not on file  . Active Member of Clubs or Organizations: Not on file  . Attends Banker Meetings: Not on file  . Marital Status: Not on file  Intimate Partner Violence:   . Fear of Current or Ex-Partner: Not on file  . Emotionally Abused: Not on file  .  Physically Abused: Not on file  . Sexually Abused: Not on file   Family History  Problem Relation Age of Onset  . Cancer Mother        breast  . Diabetes Father     Review of Systems  Per HPI.  Objective: Office vital signs reviewed. BP 107/65   Pulse 92   Temp 98.3 F (36.8 C) (Temporal)   Resp 20   SpO2 98%   Physical Examination:  Physical Exam Constitutional:      General: He is not in acute distress.    Appearance: Normal appearance. He is not ill-appearing or diaphoretic.  Skin:    General: Skin is warm and dry.     Findings: Rash present.       Neurological:     General: No focal deficit present.     Mental Status: He is alert and oriented to person, place, and time.  Psychiatric:         Mood and Affect: Mood normal.        Behavior: Behavior normal.      Results for orders placed or performed in visit on 05/29/18  Urine Culture   Specimen: Urine   URINE  Result Value Ref Range   Urine Culture, Routine Final report    Organism ID, Bacteria Comment   Microscopic Examination   URINE  Result Value Ref Range   WBC, UA None seen 0 - 5 /hpf   RBC, UA None seen 0 - 2 /hpf   Epithelial Cells (non renal) None seen 0 - 10 /hpf   Renal Epithel, UA None seen None seen /hpf   Bacteria, UA None seen None seen/Few  Chlamydia/Gonococcus/Trichomonas, NAA   Specimen: Urine   URINE  Result Value Ref Range   Chlamydia by NAA Negative Negative   Gonococcus by NAA Negative Negative   Trich vag by NAA Negative Negative  Urinalysis, Complete  Result Value Ref Range   Specific Gravity, UA 1.020 1.005 - 1.030   pH, UA 7.0 5.0 - 7.5   Color, UA Yellow Yellow   Appearance Ur Clear Clear   Leukocytes, UA Negative Negative   Protein, UA Negative Negative/Trace   Glucose, UA Negative Negative   Ketones, UA Negative Negative   RBC, UA Negative Negative   Bilirubin, UA Negative Negative   Urobilinogen, Ur 0.2 0.2 - 1.0 mg/dL   Nitrite, UA Negative Negative   Microscopic Examination See below:      Assessment/ Plan: Derek [name] was seen today for followup bite to left forearm.  Diagnoses and all orders for this visit:  Rash No systemic symptoms today. ?dermatitis vs fungal eruption. Lotrisone cream BID. Discussed possible need for referral to dermatology if no improvement. Return to office for new or worsening symptoms, or if symptoms persist.  -     clotrimazole-betamethasone (LOTRISONE) cream; Apply 1 application topically 2 (two) times daily.  The above assessment and management plan was discussed with the patient. The patient verbalized understanding of and has agreed to the management plan. Patient is aware to call the clinic if symptoms persist or worsen. Patient is aware  when to return to the clinic for a follow-up visit. Patient educated on when it is appropriate to go to the emergency department.   Harlow Mares, FNP-C [facility] [address] [place], [place] [address] [phone]

## 2020-06-03 NOTE — Patient Instructions (Signed)
Rash, Adult A rash is a change in the color of your skin. A rash can also change the way your skin feels. There are many different conditions and factors that can cause a rash. Some rashes may disappear after a few days, but some may last for a few weeks. Common causes of rashes include:  Viral infections, such as: ? Colds. ? Measles. ? Hand, foot, and mouth disease.  Bacterial infections, such as: ? Scarlet fever. ? Impetigo.  Fungal infections, such as Candida.  Allergic reactions to food, medicines, or skin care products. Follow these instructions at home: The goal of treatment is to stop the itching and keep the rash from spreading. Pay attention to any changes in your symptoms. Follow these instructions to help with your condition: Medicine Take or apply over-the-counter and prescription medicines only as told by your health care provider. These may include:  Corticosteroid creams to treat red or swollen skin.  Anti-itch lotions.  Oral allergy medicines (antihistamines).  Oral corticosteroids for severe symptoms.  Skin care  Apply cool compresses to the affected areas.  Do not scratch or rub your skin.  Avoid covering the rash. Make sure the rash is exposed to air as much as possible. Managing itching and discomfort  Avoid hot showers or baths, which can make itching worse. A cold shower may help.  Try taking a bath with: ? Epsom salts. Follow manufacturer instructions on the packaging. You can get these at your local pharmacy or grocery store. ? Baking soda. Pour a small amount into the bath as told by your health care provider. ? Colloidal oatmeal. Follow manufacturer instructions on the packaging. You can get this at your local pharmacy or grocery store.  Try applying baking soda paste to your skin. Stir water into baking soda until it reaches a paste-like consistency.  Try applying calamine lotion. This is an over-the-counter lotion that helps to relieve  itchiness.  Keep cool and out of the sun. Sweating and being hot can make itching worse. General instructions   Rest as needed.  Drink enough fluid to keep your urine pale yellow.  Wear loose-fitting clothing.  Avoid scented soaps, detergents, and perfumes. Use gentle soaps, detergents, perfumes, and other cosmetic products.  Avoid any substance that causes your rash. Keep a journal to help track what causes your rash. Write down: ? What you eat. ? What cosmetic products you use. ? What you drink. ? What you wear. This includes jewelry.  Keep all follow-up visits as told by your health care provider. This is important. Contact a health care provider if:  You sweat at night.  You lose weight.  You urinate more than normal.  You urinate less than normal, or you notice that your urine is a darker color than usual.  You feel weak.  You vomit.  Your skin or the whites of your eyes look yellow (jaundice).  Your skin: ? Tingles. ? Is numb.  Your rash: ? Does not go away after several days. ? Gets worse.  You are: ? Unusually thirsty. ? More tired than normal.  You have: ? New symptoms. ? Pain in your abdomen. ? A fever. ? Diarrhea. Get help right away if you:  Have a fever and your symptoms suddenly get worse.  Develop confusion.  Have a severe headache or a stiff neck.  Have severe joint pains or stiffness.  Have a seizure.  Develop a rash that covers all or most of your body. The rash may   or may not be painful.  Develop blisters that: ? Are on top of the rash. ? Grow larger or grow together. ? Are painful. ? Are inside your nose or mouth.  Develop a rash that: ? Looks like purple pinprick-sized spots all over your body. ? Has a "bull's eye" or looks like a target. ? Is not related to sun exposure, is red and painful, and causes your skin to peel. Summary  A rash is a change in the color of your skin. Some rashes disappear after a few days,  but some may last for a few weeks.  The goal of treatment is to stop the itching and keep the rash from spreading.  Take or apply over-the-counter and prescription medicines only as told by your health care provider.  Contact a health care provider if you have new or worsening symptoms.  Keep all follow-up visits as told by your health care provider. This is important. This information is not intended to replace advice given to you by your health care provider. Make sure you discuss any questions you have with your health care provider. Document Revised: 11/24/2018 Document Reviewed: 03/06/2018 Elsevier Patient Education  2020 Elsevier Inc.  

## 2020-06-30 ENCOUNTER — Other Ambulatory Visit: Payer: Self-pay

## 2020-07-09 ENCOUNTER — Other Ambulatory Visit: Payer: Self-pay | Admitting: Physician Assistant

## 2020-07-17 ENCOUNTER — Other Ambulatory Visit: Payer: Self-pay | Admitting: Physician Assistant

## 2020-07-17 NOTE — Pre-Procedure Instructions (Signed)
Derek [name]  07/17/2020      [facility] [address], [place] - [address] [place] [address] Phone: [phone] Fax: [phone]    Your procedure is scheduled on Dec. 7  Report to [facility] Entrance A at 7:30 A.M.  Call this number if you have problems the morning of surgery:  [phone]   Remember:  Do not eat after midnight.    You may drink clear liquids until 6:30 A.M. .  Clear liquids allowed are:                    Water, Juice (non-citric and without pulp - diabetics please choose diet or no sugar options), Carbonated beverages - (diabetics please choose diet or no sugar options), Clear Tea, Black Coffee only (no creamer, milk or cream including half and half), Plain Jell-O only (diabetics please choose diet or no sugar options), Gatorade (diabetics please choose diet or no sugar options) and Plain Popsicles only                   Enhanced Recovery after Surgery for Orthopedics Enhanced Recovery after Surgery is a protocol used to improve the stress on your body and your recovery after surgery.  Patient Instructions  . The night before surgery:  o No food after midnight. ONLY clear liquids after midnight  .  Marland Kitchen The day of surgery (if you do NOT have diabetes):  o Drink ONE (1) Pre-Surgery Clear Ensure by ___6:30__ am the morning of surgery   o This drink was given to you during your hospital  pre-op appointment visit. o Nothing else to drink after completing the  Pre-Surgery Clear Ensure.          If you have questions, please contact your surgeon's office.     Take these medicines the morning of surgery with A SIP OF WATER :              Cetirizine (zyrtec)             Omeprazole (prilosec)               7 days prior to surgery STOP taking any Aspirin (unless otherwise instructed by your surgeon), Aleve, Naproxen, Ibuprofen, Motrin, Advil, Goody's, BC's, all herbal medications, fish oil, and all vitamins.    Do not  wear jewelry.  Do not wear lotions, powders, or perfumes, or deodorant.  Do not shave 48 hours prior to surgery.  Men may shave face and neck.  Do not bring valuables to the hospital.  [facility] is not responsible for any belongings or valuables.  Contacts, dentures or bridgework may not be worn into surgery.  Leave your suitcase in the car.  After surgery it may be brought to your room.  For patients admitted to the hospital, discharge time will be determined by your treatment team.  Patients discharged the day of surgery will not be allowed to drive home.    Special instructions:   [facility]- Preparing For Surgery  Before surgery, you can play an important role. Because skin is not sterile, your skin needs to be as free of germs as possible. You can reduce the number of germs on your skin by washing with CHG (chlorahexidine gluconate) Soap before surgery.  CHG is an antiseptic cleaner which kills germs and bonds with the skin to continue killing germs even after washing.    Oral Hygiene is also  important to reduce your risk of infection.  Remember - BRUSH YOUR TEETH THE MORNING OF SURGERY WITH YOUR REGULAR TOOTHPASTE  Please do not use if you have an allergy to CHG or antibacterial soaps. If your skin becomes reddened/irritated stop using the CHG.  Do not shave (including legs and underarms) for at least 48 hours prior to first CHG shower. It is OK to shave your face.  Please follow these instructions carefully.   1. Shower the NIGHT BEFORE SURGERY and the MORNING OF SURGERY with CHG.   2. If you chose to wash your hair, wash your hair first as usual with your normal shampoo.  3. After you shampoo, rinse your hair and body thoroughly to remove the shampoo.  4. Use CHG as you would any other liquid soap. You can apply CHG directly to the skin and wash gently with a scrungie or a clean washcloth.   5. Apply the CHG Soap to your body ONLY FROM THE NECK DOWN.  Do not use on open  wounds or open sores. Avoid contact with your eyes, ears, mouth and genitals (private parts). Wash Face and genitals (private parts)  with your normal soap.  6. Wash thoroughly, paying special attention to the area where your surgery will be performed.  7. Thoroughly rinse your body with warm water from the neck down.  8. DO NOT shower/wash with your normal soap after using and rinsing off the CHG Soap.  9. Pat yourself dry with a CLEAN TOWEL.  10. Wear CLEAN PAJAMAS to bed the night before surgery, wear comfortable clothes the morning of surgery  11. Place CLEAN SHEETS on your bed the night of your first shower and DO NOT SLEEP WITH PETS.    Day of Surgery:  Do not apply any deodorants/lotions.  Please wear clean clothes to the hospital/surgery center.   Remember to brush your teeth WITH YOUR REGULAR TOOTHPASTE.    Please read over the following fact sheets that you were given.

## 2020-07-18 ENCOUNTER — Encounter (HOSPITAL_COMMUNITY)
Admission: RE | Admit: 2020-07-18 | Discharge: 2020-07-18 | Disposition: A | Discharge status: Home / Self Care | Payer: Medicare HMO | Source: Ambulatory Visit | Attending: Orthopaedic Surgery | Admitting: Orthopaedic Surgery

## 2020-07-18 ENCOUNTER — Encounter (HOSPITAL_COMMUNITY): Payer: Self-pay

## 2020-07-18 ENCOUNTER — Other Ambulatory Visit: Payer: Self-pay | Admitting: Physician Assistant

## 2020-07-18 ENCOUNTER — Other Ambulatory Visit (HOSPITAL_COMMUNITY)
Admission: RE | Admit: 2020-07-18 | Discharge: 2020-07-18 | Disposition: A | Discharge status: Home / Self Care | Payer: Medicare HMO | Source: Ambulatory Visit | Attending: Orthopaedic Surgery | Admitting: Orthopaedic Surgery

## 2020-07-18 ENCOUNTER — Other Ambulatory Visit: Payer: Self-pay

## 2020-07-18 DIAGNOSIS — Z20822 Contact with and (suspected) exposure to covid-19: Secondary | ICD-10-CM | POA: Insufficient documentation

## 2020-07-18 DIAGNOSIS — Z01812 Encounter for preprocedural laboratory examination: Secondary | ICD-10-CM | POA: Insufficient documentation

## 2020-07-18 DIAGNOSIS — A4901 Methicillin susceptible Staphylococcus aureus infection, unspecified site: Secondary | ICD-10-CM | POA: Diagnosis not present

## 2020-07-18 HISTORY — DX: Unspecified osteoarthritis, unspecified site: M19.90

## 2020-07-18 LAB — CBC
HCT: 36.5 % — ABNORMAL LOW (ref 39.0–52.0)
Hemoglobin: 11.8 g/dL — ABNORMAL LOW (ref 13.0–17.0)
MCH: 31.7 pg (ref 26.0–34.0)
MCHC: 32.3 g/dL (ref 30.0–36.0)
MCV: 98.1 fL (ref 80.0–100.0)
Platelets: 196 10*3/uL (ref 150–400)
RBC: 3.72 MIL/uL — ABNORMAL LOW (ref 4.22–5.81)
RDW: 12.8 % (ref 11.5–15.5)
WBC: 9.2 10*3/uL (ref 4.0–10.5)
nRBC: 0 % (ref 0.0–0.2)

## 2020-07-18 LAB — SURGICAL PCR SCREEN
MRSA, PCR: NEGATIVE
Staphylococcus aureus: POSITIVE — AB

## 2020-07-18 LAB — SARS CORONAVIRUS 2 (TAT 6-24 HRS): SARS Coronavirus 2: NEGATIVE

## 2020-07-18 NOTE — Progress Notes (Signed)
PCP - Ivin Booty Dettinger Cardiologist - denies  PPM/ICD - denies   Chest x-ray - n/a EKG - n/a Stress Test - over 20 years ago, nothing found so pt didn't need to follow up with a cardiologist ECHO - denies Cardiac Cath - denies  Sleep Study - denies  Patient instructed to hold all Aspirin, NSAID's, herbal medications, fish oil and vitamins 7 days prior to surgery.   ERAS Protcol -yes PRE-SURGERY Ensure or G2- ensure and drink given  COVID TEST- 07/18/2020   Anesthesia review: no  Patient denies shortness of breath, fever, cough and chest pain at PAT appointment   All instructions explained to the patient, with a verbal understanding of the material. Patient agrees to go over the instructions while at home for a better understanding. Patient also instructed to self quarantine after being tested for COVID-19. The opportunity to ask questions was provided.

## 2020-07-19 LAB — TYPE AND SCREEN
ABO/RH(D): A POS
Antibody Screen: NEGATIVE

## 2020-07-21 NOTE — H&P (Signed)
TOTAL KNEE ADMISSION H&P  Patient is being admitted for left total knee arthroplasty.  Subjective:  Chief Complaint:left knee pain.  HPI: Derek [name], 74 y.o. male, has a history of pain and functional disability in the left knee due to arthritis and has failed non-surgical conservative treatments for greater than 12 weeks to includeNSAID's and/or analgesics, corticosteriod injections, viscosupplementation injections, flexibility and strengthening excercises, supervised PT with diminished ADL's post treatment, use of assistive devices, weight reduction as appropriate and activity modification.  Onset of symptoms was gradual, starting 5 years ago with gradually worsening course since that time. The patient noted no past surgery on the left knee(s).  Patient currently rates pain in the left knee(s) at 10 out of 10 with activity. Patient has night pain, worsening of pain with activity and weight bearing, pain that interferes with activities of daily living, pain with passive range of motion, crepitus and joint swelling.  Patient has evidence of subchondral sclerosis, periarticular osteophytes and joint space narrowing by imaging studies. There is no active infection.  Patient Active Problem List   Diagnosis Date Noted  . Unilateral primary osteoarthritis, left knee 03/24/2020  . Knee pain, chronic 06/15/2016  . GERD (gastroesophageal reflux disease)   . Morbid obesity (HCC)   . Urethritis 03/20/2013   Past Medical History:  Diagnosis Date  . Arthritis    left knee  . GERD (gastroesophageal reflux disease)   . Hypercholesterolemia   . Obesity   . Urethritis    when he was in [employer]    Past Surgical History:  Procedure Laterality Date  . COLONOSCOPY      No current facility-administered medications for this encounter.   Current Outpatient Medications  Medication Sig Dispense Refill Last Dose  . cetirizine (ZYRTEC) 10 MG tablet Take 1 tablet (10 mg total) by mouth daily. 30 tablet  11   . cholecalciferol (VITAMIN D) 1000 UNITS tablet Take 1,000 Units by mouth daily.     . clotrimazole-betamethasone (LOTRISONE) cream Apply 1 application topically 2 (two) times daily. 30 g 0   . omeprazole (PRILOSEC) 20 MG capsule Take 20 mg by mouth daily.     . vitamin B-12 (CYANOCOBALAMIN) 1000 MCG tablet Take 1,000 mcg by mouth daily.     . cephALEXin (KEFLEX) 500 MG capsule Take 1 capsule (500 mg total) by mouth 3 (three) times daily. (Patient not taking: Reported on 07/18/2020) 21 capsule 0 Not Taking at Unknown time   No Known Allergies  Social History   Tobacco Use  . Smoking status: Never Smoker  . Smokeless tobacco: Never Used  Substance Use Topics  . Alcohol use: No    Alcohol/week: 0.0 standard drinks    Family History  Problem Relation Age of Onset  . Cancer Mother        breast  . Diabetes Father      Review of Systems  Musculoskeletal: Positive for arthralgias and joint swelling.  All other systems reviewed and are negative.   Objective:  Physical Exam Vitals reviewed.  Constitutional:      Appearance: Normal appearance.  HENT:     Head: Normocephalic and atraumatic.  Eyes:     Extraocular Movements: Extraocular movements intact.     Pupils: Pupils are equal, round, and reactive to light.  Cardiovascular:     Rate and Rhythm: Normal rate.     Pulses: Normal pulses.  Pulmonary:     Effort: Pulmonary effort is normal.  Abdominal:     Palpations: Abdomen is  soft.  Musculoskeletal:     Cervical back: Normal range of motion.     Left knee: Erythema and bony tenderness present. Decreased range of motion. Tenderness present over the medial joint line, lateral joint line and patellar tendon. Abnormal alignment and abnormal meniscus.  Neurological:     Mental Status: He is alert and oriented to person, place, and time.  Psychiatric:        Behavior: Behavior normal.     Vital signs in last 24 hours:    Labs:   Estimated body mass index is 39.71  kg/m as calculated from the following:   Height as of 07/18/20: 5\' 6"  (1.676 m).   Weight as of 07/18/20: 111.6 kg.   Imaging Review Plain radiographs demonstrate severe degenerative joint disease of the left knee(s). The overall alignment ismild varus. The bone quality appears to be good for age and reported activity level.      Assessment/Plan:  End stage arthritis, left knee   The patient history, physical examination, clinical judgment of the provider and imaging studies are consistent with end stage degenerative joint disease of the left knee(s) and total knee arthroplasty is deemed medically necessary. The treatment options including medical management, injection therapy arthroscopy and arthroplasty were discussed at length. The risks and benefits of total knee arthroplasty were presented and reviewed. The risks due to aseptic loosening, infection, stiffness, patella tracking problems, thromboembolic complications and other imponderables were discussed. The patient acknowledged the explanation, agreed to proceed with the plan and consent was signed. Patient is being admitted for inpatient treatment for surgery, pain control, PT, OT, prophylactic antibiotics, VTE prophylaxis, progressive ambulation and ADL's and discharge planning. The patient is planning to be discharged home with home health services

## 2020-07-22 ENCOUNTER — Encounter (HOSPITAL_COMMUNITY): Payer: Self-pay | Admitting: Orthopaedic Surgery

## 2020-07-22 ENCOUNTER — Encounter (HOSPITAL_COMMUNITY)
Admission: AD | Disposition: A | Discharge status: Home Health | Payer: Self-pay | Source: Home / Self Care | Attending: Orthopaedic Surgery

## 2020-07-22 ENCOUNTER — Other Ambulatory Visit: Payer: Self-pay

## 2020-07-22 ENCOUNTER — Inpatient Hospital Stay (HOSPITAL_COMMUNITY)
Admission: AD | Admit: 2020-07-22 | Discharge: 2020-07-24 | DRG: 470 | Disposition: A | Discharge status: Home Health | Payer: Medicare HMO | Attending: Orthopaedic Surgery | Admitting: Orthopaedic Surgery

## 2020-07-22 ENCOUNTER — Ambulatory Visit (HOSPITAL_COMMUNITY): Payer: Medicare HMO | Admitting: Anesthesiology

## 2020-07-22 ENCOUNTER — Observation Stay (HOSPITAL_COMMUNITY): Payer: Medicare HMO

## 2020-07-22 DIAGNOSIS — Z96652 Presence of left artificial knee joint: Secondary | ICD-10-CM | POA: Diagnosis not present

## 2020-07-22 DIAGNOSIS — Z803 Family history of malignant neoplasm of breast: Secondary | ICD-10-CM | POA: Diagnosis not present

## 2020-07-22 DIAGNOSIS — G8918 Other acute postprocedural pain: Secondary | ICD-10-CM | POA: Diagnosis not present

## 2020-07-22 DIAGNOSIS — E119 Type 2 diabetes mellitus without complications: Secondary | ICD-10-CM | POA: Diagnosis present

## 2020-07-22 DIAGNOSIS — K219 Gastro-esophageal reflux disease without esophagitis: Secondary | ICD-10-CM | POA: Diagnosis present

## 2020-07-22 DIAGNOSIS — Z471 Aftercare following joint replacement surgery: Secondary | ICD-10-CM | POA: Diagnosis not present

## 2020-07-22 DIAGNOSIS — E7800 Pure hypercholesterolemia, unspecified: Secondary | ICD-10-CM | POA: Diagnosis present

## 2020-07-22 DIAGNOSIS — M1712 Unilateral primary osteoarthritis, left knee: Secondary | ICD-10-CM | POA: Diagnosis present

## 2020-07-22 DIAGNOSIS — Z6841 Body Mass Index (BMI) 40.0 and over, adult: Secondary | ICD-10-CM

## 2020-07-22 DIAGNOSIS — Z833 Family history of diabetes mellitus: Secondary | ICD-10-CM | POA: Diagnosis not present

## 2020-07-22 DIAGNOSIS — D62 Acute posthemorrhagic anemia: Secondary | ICD-10-CM | POA: Diagnosis not present

## 2020-07-22 HISTORY — PX: TOTAL KNEE ARTHROPLASTY: SHX125

## 2020-07-22 LAB — ABO/RH: ABO/RH(D): A POS

## 2020-07-22 IMAGING — DX DG KNEE 1-2V PORT*L*
2 series · 2 of 2 positions shown · non-contrast
Comparison: [DATE].

CLINICAL DATA: Status post left knee replacement.

EXAM:
PORTABLE LEFT KNEE - 1-2 VIEW

[knee ap]
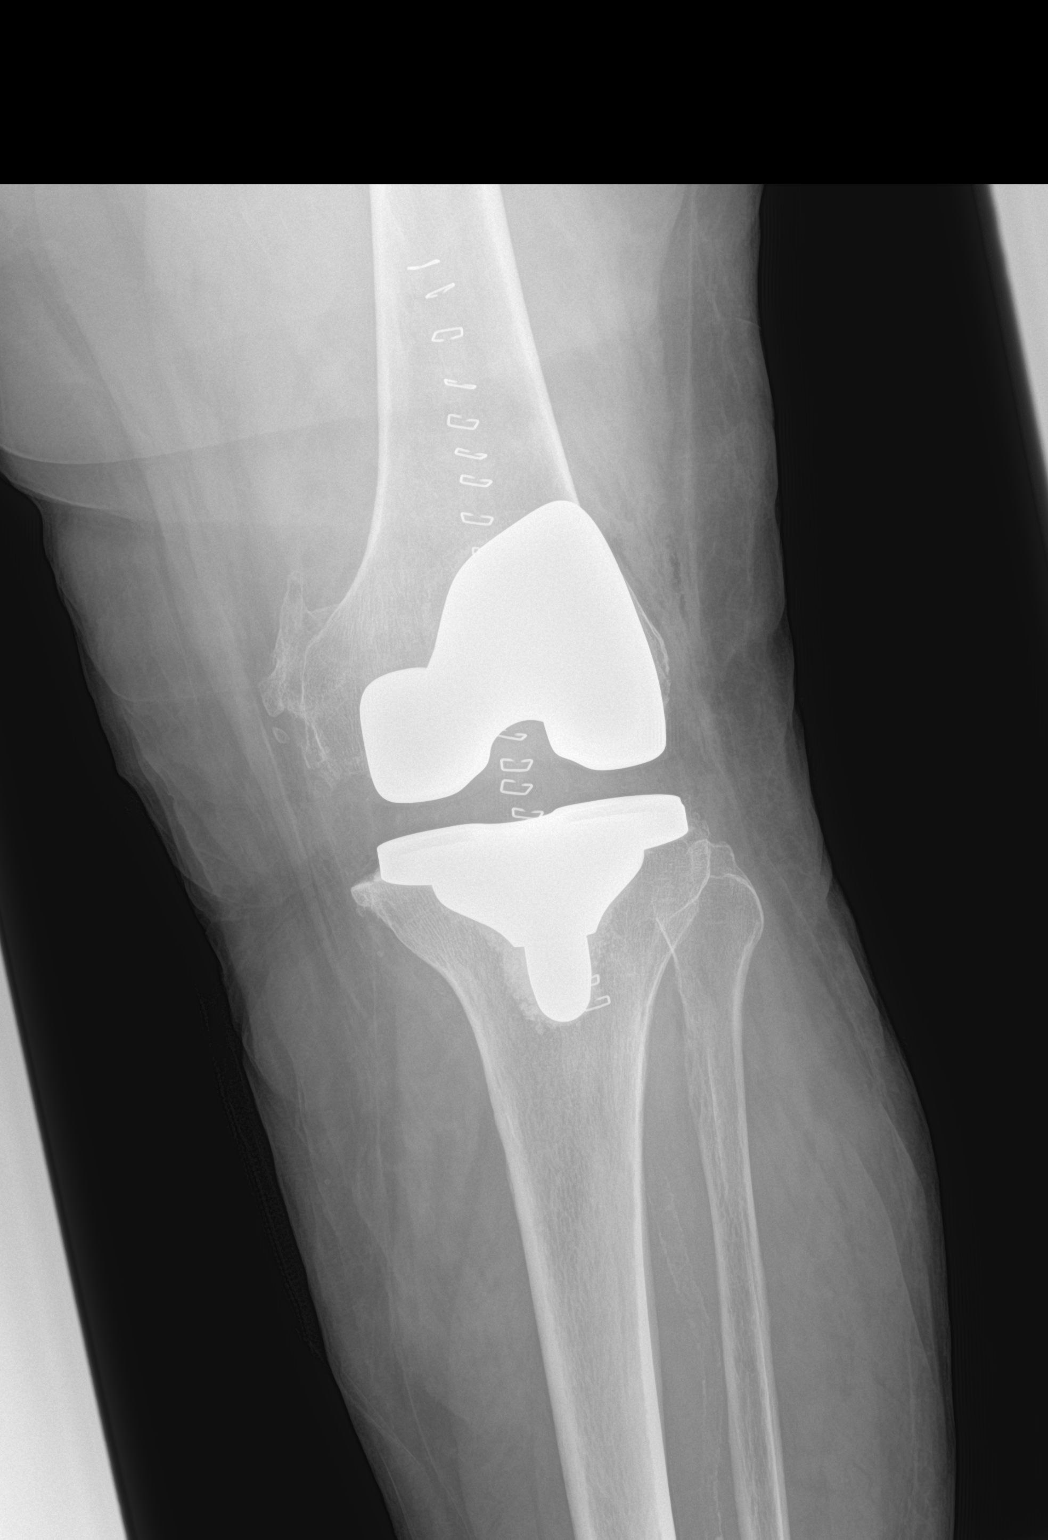

[knee lat]
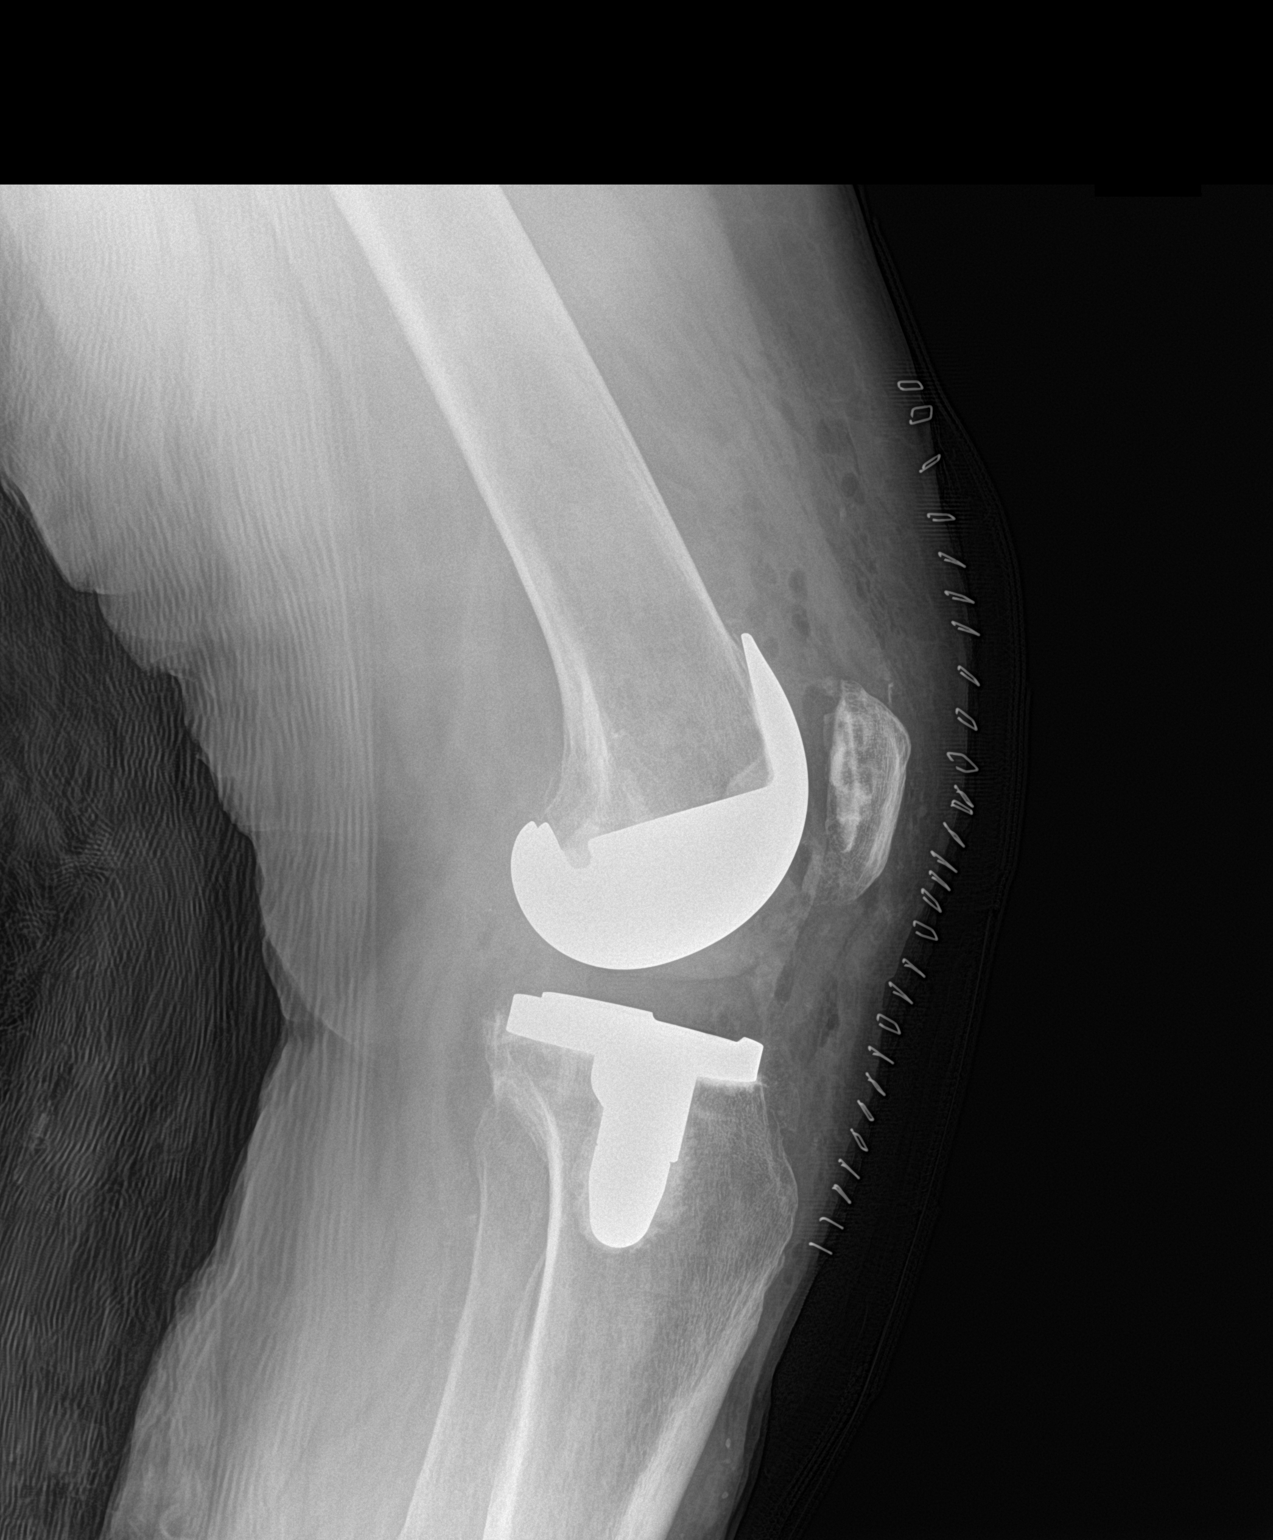

[2 of 2 positions shown; findings below may reference images not displayed]

FINDINGS: The left femoral and tibial components appear to be well situated.
Expected postoperative changes are noted in the soft tissues
anteriorly.
IMPRESSION: Status post left total knee arthroplasty.

## 2020-07-22 SURGERY — ARTHROPLASTY, KNEE, TOTAL
Anesthesia: Spinal | Site: Knee | Laterality: Left

## 2020-07-22 MED ORDER — METOCLOPRAMIDE HCL 5 MG/ML IJ SOLN: 5.0000 mg | Freq: Three times a day (TID) | INTRAMUSCULAR | Status: DC | PRN

## 2020-07-22 MED ORDER — SODIUM CHLORIDE 0.9 % IR SOLN
Status: DC | PRN
  Administered 2020-07-22: 3000 mL
  Administered 2020-07-22: 1000 mL

## 2020-07-22 MED ORDER — EPHEDRINE 5 MG/ML INJ
INTRAVENOUS | Status: AC
  Filled 2020-07-22: qty 10

## 2020-07-22 MED ORDER — DIPHENHYDRAMINE HCL 12.5 MG/5ML PO ELIX: 12.5000 mg | ORAL_SOLUTION | ORAL | Status: DC | PRN

## 2020-07-22 MED ORDER — MENTHOL 3 MG MT LOZG: 1.0000 | LOZENGE | OROMUCOSAL | Status: DC | PRN

## 2020-07-22 MED ORDER — ACETAMINOPHEN 325 MG PO TABS: 325.0000 mg | ORAL_TABLET | Freq: Four times a day (QID) | ORAL | Status: DC | PRN

## 2020-07-22 MED ORDER — ONDANSETRON HCL 4 MG/2ML IJ SOLN: 4.0000 mg | Freq: Once | INTRAMUSCULAR | Status: DC | PRN

## 2020-07-22 MED ORDER — BUPIVACAINE IN DEXTROSE 0.75-8.25 % IT SOLN
INTRATHECAL | Status: DC | PRN
  Administered 2020-07-22: 1.6 mL via INTRATHECAL

## 2020-07-22 MED ORDER — OXYCODONE HCL 5 MG/5ML PO SOLN: 5.0000 mg | Freq: Once | ORAL | Status: DC | PRN

## 2020-07-22 MED ORDER — VITAMIN D 25 MCG (1000 UNIT) PO TABS
1000.0000 [IU] | ORAL_TABLET | Freq: Every day | ORAL | Status: DC
  Administered 2020-07-23 – 2020-07-24 (×2): 1000 [IU] via ORAL
  Filled 2020-07-22 (×2): qty 1

## 2020-07-22 MED ORDER — ALUM & MAG HYDROXIDE-SIMETH 200-200-20 MG/5ML PO SUSP: 30.0000 mL | ORAL | Status: DC | PRN

## 2020-07-22 MED ORDER — ONDANSETRON HCL 4 MG PO TABS: 4.0000 mg | ORAL_TABLET | Freq: Four times a day (QID) | ORAL | Status: DC | PRN

## 2020-07-22 MED ORDER — EPHEDRINE SULFATE-NACL 50-0.9 MG/10ML-% IV SOSY
PREFILLED_SYRINGE | INTRAVENOUS | Status: DC | PRN
  Administered 2020-07-22 (×2): 5 mg via INTRAVENOUS

## 2020-07-22 MED ORDER — POLYETHYLENE GLYCOL 3350 17 G PO PACK: 17.0000 g | PACK | Freq: Every day | ORAL | Status: DC | PRN

## 2020-07-22 MED ORDER — FENTANYL CITRATE (PF) 100 MCG/2ML IJ SOLN
INTRAMUSCULAR | Status: AC
  Administered 2020-07-22: 50 ug via INTRAVENOUS
  Filled 2020-07-22: qty 2

## 2020-07-22 MED ORDER — PANTOPRAZOLE SODIUM 40 MG PO TBEC
40.0000 mg | DELAYED_RELEASE_TABLET | Freq: Every day | ORAL | Status: DC
  Administered 2020-07-23 – 2020-07-24 (×2): 40 mg via ORAL
  Filled 2020-07-22 (×2): qty 1

## 2020-07-22 MED ORDER — TRANEXAMIC ACID-NACL 1000-0.7 MG/100ML-% IV SOLN
1000.0000 mg | INTRAVENOUS | Status: AC
  Administered 2020-07-22: 1000 mg via INTRAVENOUS
  Filled 2020-07-22: qty 100

## 2020-07-22 MED ORDER — FENTANYL CITRATE (PF) 100 MCG/2ML IJ SOLN: 50.0000 ug | Freq: Once | INTRAMUSCULAR | Status: AC

## 2020-07-22 MED ORDER — LACTATED RINGERS IV SOLN: INTRAVENOUS | Status: DC

## 2020-07-22 MED ORDER — ONDANSETRON HCL 4 MG/2ML IJ SOLN
INTRAMUSCULAR | Status: AC
  Filled 2020-07-22: qty 2

## 2020-07-22 MED ORDER — CEFAZOLIN SODIUM-DEXTROSE 1-4 GM/50ML-% IV SOLN
1.0000 g | Freq: Four times a day (QID) | INTRAVENOUS | Status: AC
  Administered 2020-07-22 (×2): 1 g via INTRAVENOUS
  Filled 2020-07-22 (×2): qty 50

## 2020-07-22 MED ORDER — PHENYLEPHRINE HCL-NACL 10-0.9 MG/250ML-% IV SOLN
INTRAVENOUS | Status: DC | PRN
  Administered 2020-07-22: 25 ug/min via INTRAVENOUS

## 2020-07-22 MED ORDER — METHOCARBAMOL 1000 MG/10ML IJ SOLN
500.0000 mg | Freq: Four times a day (QID) | INTRAVENOUS | Status: DC | PRN
  Filled 2020-07-22: qty 5

## 2020-07-22 MED ORDER — POVIDONE-IODINE 10 % EX SWAB
2.0000 "application " | Freq: Once | CUTANEOUS | Status: AC
  Administered 2020-07-22: 2 via TOPICAL

## 2020-07-22 MED ORDER — METOCLOPRAMIDE HCL 5 MG PO TABS: 5.0000 mg | ORAL_TABLET | Freq: Three times a day (TID) | ORAL | Status: DC | PRN

## 2020-07-22 MED ORDER — ORAL CARE MOUTH RINSE: 15.0000 mL | Freq: Once | OROMUCOSAL | Status: AC

## 2020-07-22 MED ORDER — ASPIRIN EC 325 MG PO TBEC
325.0000 mg | DELAYED_RELEASE_TABLET | Freq: Two times a day (BID) | ORAL | Status: DC
  Administered 2020-07-22 – 2020-07-24 (×4): 325 mg via ORAL
  Filled 2020-07-22 (×4): qty 1

## 2020-07-22 MED ORDER — HYDROMORPHONE HCL 1 MG/ML IJ SOLN: 0.5000 mg | INTRAMUSCULAR | Status: DC | PRN

## 2020-07-22 MED ORDER — CEFAZOLIN SODIUM-DEXTROSE 2-4 GM/100ML-% IV SOLN
2.0000 g | INTRAVENOUS | Status: AC
  Administered 2020-07-22: 2 g via INTRAVENOUS
  Filled 2020-07-22: qty 100

## 2020-07-22 MED ORDER — MIDAZOLAM HCL 2 MG/2ML IJ SOLN
INTRAMUSCULAR | Status: AC
  Filled 2020-07-22: qty 2

## 2020-07-22 MED ORDER — ROPIVACAINE HCL 7.5 MG/ML IJ SOLN
INTRAMUSCULAR | Status: DC | PRN
  Administered 2020-07-22: 20 mL via PERINEURAL

## 2020-07-22 MED ORDER — PROPOFOL 10 MG/ML IV BOLUS
INTRAVENOUS | Status: AC
  Filled 2020-07-22: qty 20

## 2020-07-22 MED ORDER — PROPOFOL 10 MG/ML IV BOLUS
INTRAVENOUS | Status: DC | PRN
  Administered 2020-07-22: 10 mg via INTRAVENOUS

## 2020-07-22 MED ORDER — OXYCODONE HCL 5 MG PO TABS: 5.0000 mg | ORAL_TABLET | Freq: Once | ORAL | Status: DC | PRN

## 2020-07-22 MED ORDER — DOCUSATE SODIUM 100 MG PO CAPS
100.0000 mg | ORAL_CAPSULE | Freq: Two times a day (BID) | ORAL | Status: DC
  Administered 2020-07-22 – 2020-07-24 (×4): 100 mg via ORAL
  Filled 2020-07-22 (×4): qty 1

## 2020-07-22 MED ORDER — CHLORHEXIDINE GLUCONATE 0.12 % MT SOLN
15.0000 mL | Freq: Once | OROMUCOSAL | Status: AC
  Administered 2020-07-22: 15 mL via OROMUCOSAL
  Filled 2020-07-22: qty 15

## 2020-07-22 MED ORDER — OXYCODONE HCL 5 MG PO TABS
10.0000 mg | ORAL_TABLET | ORAL | Status: DC | PRN
  Administered 2020-07-24: 10 mg via ORAL

## 2020-07-22 MED ORDER — PHENOL 1.4 % MT LIQD: 1.0000 | OROMUCOSAL | Status: DC | PRN

## 2020-07-22 MED ORDER — FENTANYL CITRATE (PF) 100 MCG/2ML IJ SOLN: 25.0000 ug | INTRAMUSCULAR | Status: DC | PRN

## 2020-07-22 MED ORDER — ONDANSETRON HCL 4 MG/2ML IJ SOLN: 4.0000 mg | Freq: Four times a day (QID) | INTRAMUSCULAR | Status: DC | PRN

## 2020-07-22 MED ORDER — PROPOFOL 500 MG/50ML IV EMUL
INTRAVENOUS | Status: DC | PRN
  Administered 2020-07-22: 75 ug/kg/min via INTRAVENOUS

## 2020-07-22 MED ORDER — OXYCODONE HCL 5 MG PO TABS
5.0000 mg | ORAL_TABLET | ORAL | Status: DC | PRN
  Administered 2020-07-23: 5 mg via ORAL
  Administered 2020-07-23: 10 mg via ORAL
  Administered 2020-07-23: 5 mg via ORAL
  Filled 2020-07-22: qty 1
  Filled 2020-07-22 (×3): qty 2

## 2020-07-22 MED ORDER — SODIUM CHLORIDE 0.9 % IV SOLN: INTRAVENOUS | Status: DC

## 2020-07-22 MED ORDER — VITAMIN B-12 1000 MCG PO TABS
1000.0000 ug | ORAL_TABLET | Freq: Every day | ORAL | Status: DC
  Administered 2020-07-23 – 2020-07-24 (×2): 1000 ug via ORAL
  Filled 2020-07-22 (×2): qty 1

## 2020-07-22 MED ORDER — METHOCARBAMOL 500 MG PO TABS
500.0000 mg | ORAL_TABLET | Freq: Four times a day (QID) | ORAL | Status: DC | PRN
  Administered 2020-07-23 – 2020-07-24 (×4): 500 mg via ORAL
  Filled 2020-07-22 (×4): qty 1

## 2020-07-22 MED ORDER — ONDANSETRON HCL 4 MG/2ML IJ SOLN
INTRAMUSCULAR | Status: DC | PRN
  Administered 2020-07-22: 4 mg via INTRAVENOUS

## 2020-07-22 MED ORDER — GLYCOPYRROLATE PF 0.2 MG/ML IJ SOSY
PREFILLED_SYRINGE | INTRAMUSCULAR | Status: DC | PRN
  Administered 2020-07-22 (×2): .1 mg via INTRAVENOUS

## 2020-07-22 SURGICAL SUPPLY — 66 items
BANDAGE ESMARK 6X9 LF (GAUZE/BANDAGES/DRESSINGS) ×1 IMPLANT
BASEPLATE TIBIAL UNV SZ5 (Plate) ×3 IMPLANT
BLADE SAG 18X100X1.27 (BLADE) ×3 IMPLANT
BNDG ELASTIC 6X5.8 VLCR STR LF (GAUZE/BANDAGES/DRESSINGS) ×6 IMPLANT
BNDG ESMARK 6X9 LF (GAUZE/BANDAGES/DRESSINGS) ×3
BOWL SMART MIX CTS (DISPOSABLE) ×3 IMPLANT
CEMENT BONE SIMPLEX SPEEDSET (Cement) ×6 IMPLANT
CLOSURE WOUND 1/2 X4 (GAUZE/BANDAGES/DRESSINGS)
COVER SURGICAL LIGHT HANDLE (MISCELLANEOUS) ×3 IMPLANT
COVER WAND RF STERILE (DRAPES) ×3 IMPLANT
CUFF TOURN SGL QUICK 34 (TOURNIQUET CUFF) ×3
CUFF TRNQT CYL 34X4.125X (TOURNIQUET CUFF) ×1 IMPLANT
DRAPE EXTREMITY T 121X128X90 (DISPOSABLE) ×3 IMPLANT
DRAPE HALF SHEET 40X57 (DRAPES) ×3 IMPLANT
DRAPE U-SHAPE 47X51 STRL (DRAPES) ×3 IMPLANT
DRSG PAD ABDOMINAL 8X10 ST (GAUZE/BANDAGES/DRESSINGS) ×3 IMPLANT
DURAPREP 26ML APPLICATOR (WOUND CARE) ×6 IMPLANT
ELECT CAUTERY BLADE 6.4 (BLADE) ×3 IMPLANT
ELECT REM PT RETURN 9FT ADLT (ELECTROSURGICAL) ×3
ELECTRODE REM PT RTRN 9FT ADLT (ELECTROSURGICAL) ×1 IMPLANT
FACESHIELD WRAPAROUND (MASK) ×9 IMPLANT
FEMORAL PEG DISTAL FIXATION (Orthopedic Implant) ×3 IMPLANT
FEMORAL POST STABILIZED NO 5 (Orthopedic Implant) ×3 IMPLANT
GAUZE SPONGE 4X4 12PLY STRL (GAUZE/BANDAGES/DRESSINGS) ×3 IMPLANT
GAUZE XEROFORM 5X9 LF (GAUZE/BANDAGES/DRESSINGS) ×3 IMPLANT
GLOVE BIO SURGEON STRL SZ7 (GLOVE) ×3 IMPLANT
GLOVE BIOGEL PI IND STRL 6.5 (GLOVE) ×1 IMPLANT
GLOVE BIOGEL PI IND STRL 8 (GLOVE) ×2 IMPLANT
GLOVE BIOGEL PI INDICATOR 6.5 (GLOVE) ×2
GLOVE BIOGEL PI INDICATOR 8 (GLOVE) ×4
GLOVE ORTHO TXT STRL SZ7.5 (GLOVE) ×9 IMPLANT
GLOVE SURG ORTHO 7.0 STRL STRW (GLOVE) ×6 IMPLANT
GLOVE SURG ORTHO 8.0 STRL STRW (GLOVE) ×3 IMPLANT
GOWN STRL REUS W/ TWL LRG LVL3 (GOWN DISPOSABLE) ×2 IMPLANT
GOWN STRL REUS W/ TWL XL LVL3 (GOWN DISPOSABLE) ×2 IMPLANT
GOWN STRL REUS W/TWL LRG LVL3 (GOWN DISPOSABLE) ×6
GOWN STRL REUS W/TWL XL LVL3 (GOWN DISPOSABLE) ×6
HANDPIECE INTERPULSE COAX TIP (DISPOSABLE) ×3
IMMOBILIZER KNEE 22 UNIV (SOFTGOODS) ×3 IMPLANT
INSERT TIB BEAR X3 (Insert) ×3 IMPLANT
INSERT TIB BEARING 5X16 E (Joint) ×3 IMPLANT
KIT BASIN OR (CUSTOM PROCEDURE TRAY) ×3 IMPLANT
KIT TURNOVER KIT B (KITS) ×3 IMPLANT
MANIFOLD NEPTUNE II (INSTRUMENTS) ×3 IMPLANT
NS IRRIG 1000ML POUR BTL (IV SOLUTION) ×3 IMPLANT
PACK TOTAL JOINT (CUSTOM PROCEDURE TRAY) ×3 IMPLANT
PAD ARMBOARD 7.5X6 YLW CONV (MISCELLANEOUS) ×3 IMPLANT
PADDING CAST COTTON 6X4 STRL (CAST SUPPLIES) ×3 IMPLANT
PATELLA 32MMX10MM (Knees) ×3 IMPLANT
SET HNDPC FAN SPRY TIP SCT (DISPOSABLE) ×1 IMPLANT
SET PAD KNEE POSITIONER (MISCELLANEOUS) ×3 IMPLANT
STAPLER VISISTAT 35W (STAPLE) ×3 IMPLANT
STRIP CLOSURE SKIN 1/2X4 (GAUZE/BANDAGES/DRESSINGS) IMPLANT
SUCTION FRAZIER HANDLE 10FR (MISCELLANEOUS) ×3
SUCTION TUBE FRAZIER 10FR DISP (MISCELLANEOUS) ×1 IMPLANT
SUT MNCRL AB 4-0 PS2 18 (SUTURE) IMPLANT
SUT VIC AB 0 CT1 27 (SUTURE) ×3
SUT VIC AB 0 CT1 27XBRD ANBCTR (SUTURE) ×1 IMPLANT
SUT VIC AB 1 CT1 27 (SUTURE) ×6
SUT VIC AB 1 CT1 27XBRD ANBCTR (SUTURE) ×2 IMPLANT
SUT VIC AB 2-0 CT1 27 (SUTURE) ×6
SUT VIC AB 2-0 CT1 TAPERPNT 27 (SUTURE) ×2 IMPLANT
TOWEL GREEN STERILE (TOWEL DISPOSABLE) ×3 IMPLANT
TOWEL GREEN STERILE FF (TOWEL DISPOSABLE) ×3 IMPLANT
TRAY FOLEY MTR SLVR 14FR STAT (SET/KITS/TRAYS/PACK) ×3 IMPLANT
WRAP KNEE MAXI GEL POST OP (GAUZE/BANDAGES/DRESSINGS) ×3 IMPLANT

## 2020-07-22 NOTE — Anesthesia Procedure Notes (Signed)
Spinal  Patient location during procedure: OR Start time: 07/22/2020 9:50 AM End time: 07/22/2020 9:55 AM Staffing Performed: anesthesiologist  Anesthesiologist: [name], MD Preanesthetic Checklist Completed: patient identified, IV checked, risks and benefits discussed, surgical consent, monitors and equipment checked, pre-op evaluation and timeout performed Spinal Block Patient position: sitting Prep: DuraPrep Patient monitoring: heart rate, cardiac monitor, continuous pulse ox and blood pressure Approach: midline Location: L3-4 Injection technique: single-shot Needle Needle type: Quincke  Needle gauge: 22 G Needle length: 12.7 cm Additional Notes Consent was obtained prior to the procedure with all questions answered and concerns addressed. Risks including, but not limited to, bleeding, infection, nerve damage, paralysis, failed block, inadequate analgesia, allergic reaction, high spinal, itching, and headache were discussed and the patient wished to proceed. Functioning IV was confirmed and monitors were applied. Sterile prep and drape, including hand hygiene, mask, and sterile gloves were used. The patient was positioned and the spine was prepped. The skin was anesthetized with lidocaine. Free flow of clear CSF was obtained prior to injecting local anesthetic into the CSF. The spinal needle aspirated freely following injection. The needle was carefully withdrawn. The patient tolerated the procedure well.   Leslye Peer, MD

## 2020-07-22 NOTE — Anesthesia Preprocedure Evaluation (Addendum)
Anesthesia Evaluation  Patient identified by MRN, date of birth, ID band Patient awake    Reviewed: Allergy & Precautions, NPO status , Patient's Chart, lab work & pertinent test results  History of Anesthesia Complications Negative for: history of anesthetic complications  Airway Mallampati: II  TM Distance: >3 FB Neck ROM: Full    Dental  (+) Dental Advisory Given, Teeth Intact   Pulmonary neg pulmonary ROS,    Pulmonary exam normal        Cardiovascular negative cardio ROS Normal cardiovascular exam     Neuro/Psych negative neurological ROS  negative psych ROS   GI/Hepatic Neg liver ROS, GERD  Medicated and Controlled,  Endo/Other  Morbid obesity  Renal/GU negative Renal ROS     Musculoskeletal  (+) Arthritis ,   Abdominal   Peds  Hematology  (+) anemia ,   Anesthesia Other Findings Covid test negative   Reproductive/Obstetrics                           Anesthesia Physical Anesthesia Plan  ASA: III  Anesthesia Plan: Spinal   Post-op Pain Management:  Regional for Post-op pain   Induction:   PONV Risk Score and Plan: 1 and Treatment may vary due to age or medical condition and Propofol infusion  Airway Management Planned: Natural Airway and Simple Face Mask  Additional Equipment: None  Intra-op Plan:   Post-operative Plan:   Informed Consent: I have reviewed the patients History and Physical, chart, labs and discussed the procedure including the risks, benefits and alternatives for the proposed anesthesia with the patient or authorized representative who has indicated his/her understanding and acceptance.       Plan Discussed with: CRNA and Anesthesiologist  Anesthesia Plan Comments: (Labs reviewed, platelets acceptable. Discussed risks and benefits of spinal, including spinal/epidural hematoma, infection, failed block, and PDPH. Patient expressed understanding and  wished to proceed. )       Anesthesia Quick Evaluation

## 2020-07-22 NOTE — Interval H&P Note (Signed)
History and Physical Interval Note:  The patient is here for a left total knee replacement to treat his left knee arthritis.  There has been no change in his medical status.  See recent H&P.  The risks and benefits of surgery have been discussed in detail and informed consent is obtained.  07/22/2020 9:10 AM  Derek [name]  has presented today for surgery, with the diagnosis of osteoarthritis left knee.  The various methods of treatment have been discussed with the patient and family. After consideration of risks, benefits and other options for treatment, the patient has consented to  Procedure(s): LEFT TOTAL KNEE ARTHROPLASTY (Left) as a surgical intervention.  The patient's history has been reviewed, patient examined, no change in status, stable for surgery.  I have reviewed the patient's chart and labs.  Questions were answered to the patient's satisfaction.     Kathryne Hitch

## 2020-07-22 NOTE — Anesthesia Procedure Notes (Signed)
Procedure Name: MAC Date/Time: 07/22/2020 9:40 AM Performed by: Maxwell Caul, CRNA Pre-anesthesia Checklist: Patient identified, Emergency Drugs available, Suction available and Patient being monitored Oxygen Delivery Method: Simple face mask

## 2020-07-22 NOTE — Anesthesia Procedure Notes (Signed)
Anesthesia Regional Block: Adductor canal block   Pre-Anesthetic Checklist: ,, timeout performed, Correct Patient, Correct Site, Correct Laterality, Correct Procedure, Correct Position, site marked, Risks and benefits discussed,  Surgical consent,  Pre-op evaluation,  At surgeon's request and post-op pain management  Laterality: Left  Prep: chloraprep       Needles:  Injection technique: Single-shot  Needle Type: Echogenic Needle     Needle Length: 10cm  Needle Gauge: 21     Additional Needles:   Narrative:  Start time: 07/22/2020 9:00 AM End time: 07/22/2020 9:03 AM Injection made incrementally with aspirations every 5 mL.  Performed by: Personally  Anesthesiologist: [name], MD  Additional Notes: No pain on injection. No increased resistance to injection. Injection made in 5cc increments. Good needle visualization. Patient tolerated the procedure well.

## 2020-07-22 NOTE — Transfer of Care (Signed)
Immediate Anesthesia Transfer of Care Note  Patient: Derek [name]  Procedure(s) Performed: LEFT TOTAL KNEE ARTHROPLASTY (Left Knee)  Patient Location: PACU  Anesthesia Type:Spinal  Level of Consciousness: awake, alert  and oriented  Airway & Oxygen Therapy: Patient Spontanous Breathing and Patient connected to face mask oxygen  Post-op Assessment: Report given to RN and Post -op Vital signs reviewed and stable  Post vital signs: Reviewed and stable  Last Vitals:  Vitals Value Taken Time  BP 117/75 07/22/20 1210  Temp    Pulse 81 07/22/20 1211  Resp 13 07/22/20 1211  SpO2 93 % 07/22/20 1211  Vitals shown include unvalidated device data.  Last Pain:  Vitals:   07/22/20 0823  TempSrc:   PainSc: 5       Patients Stated Pain Goal: 3 (07/22/20 2376)  Complications: No complications documented.

## 2020-07-22 NOTE — Brief Op Note (Signed)
07/22/2020  11:51 AM  PATIENT:  Derek [name]  74 y.o. male  PRE-OPERATIVE DIAGNOSIS:  osteoarthritis left knee  POST-OPERATIVE DIAGNOSIS:  osteoarthritis left knee  PROCEDURE:  Procedure(s): LEFT TOTAL KNEE ARTHROPLASTY (Left)  SURGEON:  Surgeon(s) and Role:    Kathryne Hitch, MD - Primary  PHYSICIAN ASSISTANT:  Rexene Edison, PA-C  ANESTHESIA:   regional and spinal  EBL:  50 mL   COUNTS:  YES  TOURNIQUET:   Total Tourniquet Time Documented: Thigh (Left) - 76 minutes Total: Thigh (Left) - 76 minutes   DICTATION: .Other Dictation: Dictation Number [id number]  PLAN OF CARE: Admit for overnight observation  PATIENT DISPOSITION:  PACU - hemodynamically stable.   Delay start of Pharmacological VTE agent (>24hrs) due to surgical blood loss or risk of bleeding: no

## 2020-07-22 NOTE — Anesthesia Postprocedure Evaluation (Signed)
Anesthesia Post Note  Patient: Derek [name]  Procedure(s) Performed: LEFT TOTAL KNEE ARTHROPLASTY (Left Knee)     Patient location during evaluation: PACU Anesthesia Type: Spinal Level of consciousness: awake and alert Pain management: pain level controlled Vital Signs Assessment: post-procedure vital signs reviewed and stable Respiratory status: spontaneous breathing and respiratory function stable Cardiovascular status: blood pressure returned to baseline and stable Postop Assessment: spinal receding and no apparent nausea or vomiting Anesthetic complications: no   No complications documented.  Last Vitals:  Vitals:   07/22/20 1345 07/22/20 1415  BP: 90/65 119/66  Pulse: 75 65  Resp: (!) 21 19  Temp:    SpO2: 96% 97%    Last Pain:  Vitals:   07/22/20 1315  TempSrc:   PainSc: 0-No pain                 [name]

## 2020-07-22 NOTE — Discharge Instructions (Signed)

## 2020-07-22 NOTE — Evaluation (Signed)
Physical Therapy Evaluation Patient Details Name: Derek [name] MRN: [id number] DOB: June 09, 1946 Today's Date: 07/22/2020   History of Present Illness  Pt is a 74 y/o male s/p L TKA. PMH includes GERD and arthritis.   Clinical Impression  Pt is s/p surgery above and deficits below. Pt requiring min A to stand using RW this session. Pt with L knee buckling, even with use of KI, so further mobility deferred. Educated about knee precautions. Reports a friend will be staying with him at home. Will continue to follow acutely.     Follow Up Recommendations Follow surgeon's recommendation for DC plan and follow-up therapies    Equipment Recommendations  3in1 (PT)    Recommendations for Other Services       Precautions / Restrictions Precautions Precautions: Knee Precaution Booklet Issued: No Precaution Comments: Verbally reviewed knee precautions.  Required Braces or Orthoses: Knee Immobilizer - Left Restrictions Weight Bearing Restrictions: Yes LLE Weight Bearing: Weight bearing as tolerated      Mobility  Bed Mobility Overal bed mobility: Needs Assistance Bed Mobility: Supine to Sit;Sit to Supine     Supine to sit: Min assist Sit to supine: Min assist   General bed mobility comments: Min A for LLE assist. Increased time required.     Transfers Overall transfer level: Needs assistance Equipment used: Rolling walker (2 wheeled) Transfers: Sit to/from Stand Sit to Stand: Min assist         General transfer comment: Min A for lift assist and steadying to stand. Cues for hand placement as pt wanting to pull up on RW.   Ambulation/Gait             General Gait Details: Attempted side stepping, however, pt with L knee buckling, even with use of KI. Therefore further mobility deferred.   Stairs            Wheelchair Mobility    Modified Rankin (Stroke Patients Only)       Balance Overall balance assessment: Needs assistance Sitting-balance support: No  upper extremity supported Sitting balance-Leahy Scale: Fair     Standing balance support: Bilateral upper extremity supported;During functional activity Standing balance-Leahy Scale: Poor Standing balance comment: Reliant on BUE support                              Pertinent Vitals/Pain Pain Assessment: 0-10 Pain Score: 5  Pain Location: L knee Pain Descriptors / Indicators: Aching;Operative site guarding Pain Intervention(s): Limited activity within patient's tolerance;Monitored during session;Repositioned    Home Living Family/patient expects to be discharged to:: Private residence Living Arrangements: Alone Available Help at Discharge: Friend(s) Type of Home: House Home Access: Stairs to enter Entrance Stairs-Rails: None Secretary/administrator of Steps: 1 Home Layout: One level Home Equipment: Emergency planning/management officer - 2 wheels      Prior Function Level of Independence: Independent with assistive device(s)         Comments: was using RW for ambulation     Hand Dominance        Extremity/Trunk Assessment   Upper Extremity Assessment Upper Extremity Assessment: Overall WFL for tasks assessed    Lower Extremity Assessment Lower Extremity Assessment: LLE deficits/detail LLE Deficits / Details: Deficits consistent with post op pain and weakness. Notable buckling even with use of KI.     Cervical / Trunk Assessment Cervical / Trunk Assessment: Normal  Communication   Communication: No difficulties  Cognition Arousal/Alertness: Awake/alert Behavior During Therapy: [facility]  for tasks assessed/performed Overall Cognitive Status: Within Functional Limits for tasks assessed                                        General Comments      Exercises     Assessment/Plan    PT Assessment Patient needs continued PT services  PT Problem List Decreased strength;Decreased range of motion;Decreased activity tolerance;Decreased balance;Decreased  mobility;Decreased knowledge of use of DME;Decreased knowledge of precautions;Pain       PT Treatment Interventions Gait training;DME instruction;Stair training;Functional mobility training;Therapeutic exercise;Balance training;Therapeutic activities;Patient/family education    PT Goals (Current goals can be found in the Care Plan section)  Acute Rehab PT Goals Patient Stated Goal: to go home PT Goal Formulation: With patient Time For Goal Achievement: 08/05/20 Potential to Achieve Goals: Good    Frequency 7X/week   Barriers to discharge        Co-evaluation               AM-PAC PT "6 Clicks" Mobility  Outcome Measure Help needed turning from your back to your side while in a flat bed without using bedrails?: A Little Help needed moving from lying on your back to sitting on the side of a flat bed without using bedrails?: A Little Help needed moving to and from a bed to a chair (including a wheelchair)?: A Little Help needed standing up from a chair using your arms (e.g., wheelchair or bedside chair)?: A Little Help needed to walk in hospital room?: A Lot Help needed climbing 3-5 steps with a railing? : Total 6 Click Score: 15    End of Session Equipment Utilized During Treatment: Gait belt;Left knee immobilizer Activity Tolerance: Patient tolerated treatment well Patient left: in bed;with call bell/phone within reach Nurse Communication: Mobility status PT Visit Diagnosis: Unsteadiness on feet (R26.81);Muscle weakness (generalized) (M62.81);Pain Pain - Right/Left: Left Pain - part of body: Knee    Time: 8527-7824 PT Time Calculation (min) (ACUTE ONLY): 17 min   Charges:   PT Evaluation $PT Eval Low Complexity: 1 Low          Cindee Salt, DPT  Acute Rehabilitation Services  Pager: [phone] Office: [phone]   Lehman Prom 07/22/2020, 4:47 PM

## 2020-07-23 DIAGNOSIS — Z803 Family history of malignant neoplasm of breast: Secondary | ICD-10-CM | POA: Diagnosis not present

## 2020-07-23 DIAGNOSIS — Z6841 Body Mass Index (BMI) 40.0 and over, adult: Secondary | ICD-10-CM | POA: Diagnosis not present

## 2020-07-23 DIAGNOSIS — M1712 Unilateral primary osteoarthritis, left knee: Secondary | ICD-10-CM | POA: Diagnosis present

## 2020-07-23 DIAGNOSIS — K219 Gastro-esophageal reflux disease without esophagitis: Secondary | ICD-10-CM | POA: Diagnosis present

## 2020-07-23 DIAGNOSIS — E7800 Pure hypercholesterolemia, unspecified: Secondary | ICD-10-CM | POA: Diagnosis present

## 2020-07-23 DIAGNOSIS — Z833 Family history of diabetes mellitus: Secondary | ICD-10-CM | POA: Diagnosis not present

## 2020-07-23 DIAGNOSIS — E119 Type 2 diabetes mellitus without complications: Secondary | ICD-10-CM | POA: Diagnosis present

## 2020-07-23 DIAGNOSIS — D62 Acute posthemorrhagic anemia: Secondary | ICD-10-CM | POA: Diagnosis not present

## 2020-07-23 LAB — CBC
HCT: 29 % — ABNORMAL LOW (ref 39.0–52.0)
Hemoglobin: 9.9 g/dL — ABNORMAL LOW (ref 13.0–17.0)
MCH: 32.5 pg (ref 26.0–34.0)
MCHC: 34.1 g/dL (ref 30.0–36.0)
MCV: 95.1 fL (ref 80.0–100.0)
Platelets: 174 10*3/uL (ref 150–400)
RBC: 3.05 MIL/uL — ABNORMAL LOW (ref 4.22–5.81)
RDW: 12.8 % (ref 11.5–15.5)
WBC: 7.2 10*3/uL (ref 4.0–10.5)
nRBC: 0 % (ref 0.0–0.2)

## 2020-07-23 LAB — BASIC METABOLIC PANEL
Anion gap: 6 (ref 5–15)
BUN: 9 mg/dL (ref 8–23)
CO2: 26 mmol/L (ref 22–32)
Calcium: 8.7 mg/dL — ABNORMAL LOW (ref 8.9–10.3)
Chloride: 104 mmol/L (ref 98–111)
Creatinine, Ser: 0.81 mg/dL (ref 0.61–1.24)
GFR, Estimated: >60 mL/min (ref >60)
Glucose, Bld: 118 mg/dL — ABNORMAL HIGH (ref 70–99)
Potassium: 3.7 mmol/L (ref 3.5–5.1)
Sodium: 136 mmol/L (ref 135–145)

## 2020-07-23 MED ORDER — ASPIRIN 325 MG PO TBEC
325.0000 mg | DELAYED_RELEASE_TABLET | Freq: Two times a day (BID) | ORAL | 0 refills | Status: DC
Start: 2020-07-23 — End: 2021-04-29

## 2020-07-23 MED ORDER — OXYCODONE HCL 5 MG PO TABS
5.0000 mg | ORAL_TABLET | ORAL | 0 refills | Status: DC | PRN
Start: 2020-07-23 — End: 2020-11-17

## 2020-07-23 MED ORDER — METHOCARBAMOL 500 MG PO TABS: 500.0000 mg | ORAL_TABLET | Freq: Four times a day (QID) | ORAL | 1 refills | Status: DC | PRN

## 2020-07-23 MED ORDER — TAMSULOSIN HCL 0.4 MG PO CAPS
0.4000 mg | ORAL_CAPSULE | Freq: Every day | ORAL | Status: DC
  Administered 2020-07-23 – 2020-07-24 (×2): 0.4 mg via ORAL
  Filled 2020-07-23 (×2): qty 1

## 2020-07-23 NOTE — Progress Notes (Signed)
Physical Therapy Treatment Patient Details Name: Derek [name] MRN: [id number] DOB: 1946-03-26 Today's Date: 07/23/2020    History of Present Illness Pt is a 74 y/o male s/p L TKA. PMH includes GERD and arthritis.     PT Comments    Pt supine on arrival, agreeable to therapy session and with good participation and tolerance for mobility. Pt remains limited due to L knee "feeling unstable", although pt able to perform SAQ exercise unassisted and only needs minimal assist for SLR (~5-10 deg quad lag). KI donned for safety with stand pivot transfer due to subjective LLE weakness. Pt needed increased assist with bed mobility (needed trunk lift assist this session) and sit<>stand transfers (needed +24mod/maxA to stand from elevated bed). Pt unable to progress gait distance due to LLE weakness and needed +34max/totalA to pivot to bedside chair toward stronger R side. Pt Pt continues to benefit from PT services to progress toward functional mobility goals. Pt may benefit from OT consult to address activities of daily living as pt needing significantly increased assist from baseline at this time. D/C recs below remain appropriate, pending progress, pt is making very slow progress towards goals; will continue to follow acutely.   Follow Up Recommendations  Follow surgeon's recommendation for DC plan and follow-up therapies     Equipment Recommendations  3in1 (PT)    Recommendations for Other Services OT consult     Precautions / Restrictions Precautions Precautions: Knee Precaution Booklet Issued: Yes (comment) Precaution Comments: Verbally reviewed knee precautions and given TKA handout. Required Braces or Orthoses: Knee Immobilizer - Left Restrictions Weight Bearing Restrictions: Yes LLE Weight Bearing: Weight bearing as tolerated    Mobility  Bed Mobility Overal bed mobility: Needs Assistance Bed Mobility: Supine to Sit;Sit to Supine     Supine to sit: Min assist;HOB elevated Sit to  supine: Min assist   General bed mobility comments: Min A for LLE and trunk assist. Increased time required.   Transfers Overall transfer level: Needs assistance Equipment used: Rolling walker (2 wheeled) Transfers: Sit to/from [employer] Sit to Stand: From elevated surface;+2 physical assistance;Mod assist Stand pivot transfers: Max assist;+2 physical assistance;Total assist;From elevated surface       General transfer comment: pt needing greatly increased assist and bed height significantly elevated this session to perform. pt attempted without KI donned but unable to reach upright; with KI donned, pt able to stand x2 reps but remains with trunk flexed and posterior lean; poor WB tolerance through LLE and reports L knee "doesn't feel secure" even with KI donned  Ambulation/Gait             General Gait Details: attempted stepping toward chair, pt able to perform 1-2 prior to attempting to sit prior to reaching middle of chair, max/total A of 2 needed   Stairs             Wheelchair Mobility    Modified Rankin (Stroke Patients Only)       Balance Overall balance assessment: Needs assistance Sitting-balance support: No upper extremity supported Sitting balance-Leahy Scale: Fair Sitting balance - Comments: static sitting and weight shifting, needs BUE support for dynamic seated tasks   Standing balance support: Bilateral upper extremity supported;During functional activity Standing balance-Leahy Scale: Zero Standing balance comment: Reliant on BUE support                             Cognition Arousal/Alertness: Awake/alert Behavior During Therapy: [facility]  for tasks assessed/performed Overall Cognitive Status: No family/caregiver present to determine baseline cognitive functioning                                 General Comments: pt requires increased time to initiate and perform mobility tasks and needs reinforcement of  mobility cues      Exercises Total Joint Exercises Ankle Circles/Pumps: AROM;Strengthening;Both;15 reps;Supine Quad Sets: AROM;Strengthening;Both;10 reps;Supine Towel Squeeze: AROM;Strengthening;Both;10 reps;Supine Short Arc Quad: AROM;Strengthening;Left;10 reps;Supine Heel Slides: AAROM;Strengthening;Left;10 reps;Supine Straight Leg Raises: AAROM;Strengthening;Left;10 reps;Supine Goniometric ROM: L knee ROM grossly 0 to 45 degrees supine    General Comments General comments (skin integrity, edema, etc.): 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      Pertinent Vitals/Pain Pain Assessment: Faces Faces Pain Scale: Hurts little more Pain Location: L knee Pain Descriptors / Indicators: Aching;Operative site guarding;Numbness Pain Intervention(s): Limited activity within patient's tolerance;Monitored during session;Premedicated before session;Repositioned;Ice applied   Vitals:  07/22/20 2008 07/23/20 0516  (!) 144/69 123/61  91 88  16 18  99.1 F (37.3 C) 99.4 F (37.4 C)  95% 93%    Home Living                      Prior Function            PT Goals (current goals can now be found in the care plan section) Acute Rehab PT Goals Patient Stated Goal: to go home PT Goal Formulation: With patient Time For Goal Achievement: 08/05/20 Potential to Achieve Goals: Good Progress towards PT goals: Progressing toward goals (slow progress)    Frequency    7X/week      PT Plan Current plan remains appropriate    Co-evaluation              AM-PAC PT "6 Clicks" Mobility   Outcome Measure  Help needed turning from your back to your side while in a flat bed without using bedrails?: A Little Help needed moving from lying on your back to sitting on the side of a flat bed without using bedrails?: A Little Help needed moving to and from a bed to a chair (including a wheelchair)?: Total Help needed standing up from a chair using your arms (e.g.,  wheelchair or bedside chair)?: A Lot Help needed to walk in hospital room?: Total Help needed climbing 3-5 steps with a railing? : Total 6 Click Score: 11    End of Session Equipment Utilized During Treatment: Gait belt;Left knee immobilizer Activity Tolerance: Patient tolerated treatment well Patient left: in chair;with call bell/phone within reach (iceman donned) Nurse Communication: Mobility status;Need for lift equipment PT Visit Diagnosis: Unsteadiness on feet (R26.81);Muscle weakness (generalized) (M62.81);Pain Pain - Right/Left: Left Pain - part of body: Knee     Time: 3009-2330 PT Time Calculation (min) (ACUTE ONLY): 38 min  Charges:  $Therapeutic Exercise: 8-22 mins $Therapeutic Activity: 23-37 mins                     Dowell Hoon P., PTA Acute Rehabilitation Services Pager: [phone] Office: [phone]   Dorathy Kinsman Jameshia Hayashida 07/23/2020, 11:09 AM

## 2020-07-23 NOTE — Progress Notes (Signed)
Subjective: 1 Day Post-Op Procedure(s) (LRB): LEFT TOTAL KNEE ARTHROPLASTY (Left) Patient reports pain as moderate.  States he is a little sore today. No other complaints . Wants to go home today.   Objective: Vital signs in last 24 hours: Temp:  [97.2 F (36.2 C)-99.4 F (37.4 C)] 99.4 F (37.4 C) (12/08 0516) Pulse Rate:  [57-91] 88 (12/08 0516) Resp:  [11-22] 18 (12/08 0516) BP: (85-144)/(55-100) 123/61 (12/08 0516) SpO2:  [93 %-98 %] 93 % (12/08 0516)  Intake/Output from previous day: 12/07 0701 - 12/08 0700 In: 3017.8 [P.O.:700; I.V.:2017.8; IV Piggyback:300] Out: 1325 [Urine:1275; Blood:50] Intake/Output this shift: No intake/output data recorded.  Recent Labs    07/23/20 0141  HGB 9.9*   Recent Labs    07/23/20 0141  WBC 7.2  RBC 3.05*  HCT 29.0*  PLT 174   Recent Labs    07/23/20 0141  NA 136  K 3.7  CL 104  CO2 26  BUN 9  CREATININE 0.81  GLUCOSE 118*  CALCIUM 8.7*   No results for input(s): LABPT, INR in the last 72 hours.  Left lower extremity: Sensation intact distally Dorsiflexion/Plantar flexion intact Incision: dressing C/D/I Compartment soft   Assessment/Plan: 1 Day Post-Op Procedure(s) (LRB): LEFT TOTAL KNEE ARTHROPLASTY (Left) Up with therapy  Discharge to home latre today if patient remains stable and does well with PT ABLA asymptomatic will monitor for symptoms of anemia       Derek [name] 07/23/2020, 9:05 AM

## 2020-07-23 NOTE — Discharge Summary (Signed)
Patient ID: Derek [name] MRN: [id number] DOB/AGE: [birth year] 74 y.o.  Admit date: 07/22/2020 Discharge date: 07/23/2020  Admission Diagnoses:  Principal Problem:   Unilateral primary osteoarthritis, left knee Active Problems:   Status post total left knee replacement   Discharge Diagnoses:  Status post total left knee replacement Acute blood loss anemia asymtomatic     Past Medical History:  Diagnosis Date  . Arthritis    left knee  . GERD (gastroesophageal reflux disease)   . Hypercholesterolemia   . Obesity   . 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 male who was admitted 07/22/2020 for operative treatment ofUnilateral primary osteoarthritis, left knee. Patient has severe unremitting pain that affects sleep, daily activities, and work/hobbies. After pre-op clearance the patient was taken to the operating room on 07/22/2020 and underwent  Procedure(s): LEFT TOTAL KNEE ARTHROPLASTY.    Patient was given perioperative antibiotics:  Anti-infectives (From admission, onward)   Start     Dose/Rate Route Frequency Ordered Stop   07/22/20 1600  ceFAZolin (ANCEF) IVPB 1 g/50 mL premix        1 g 100 mL/hr over 30 Minutes Intravenous Every 6 hours 07/22/20 1511 07/22/20 2144   07/22/20 0815  ceFAZolin (ANCEF) IVPB 2g/100 mL premix        2 g 200 mL/hr over 30 Minutes Intravenous On call to O.R. 07/22/20 0807 07/22/20 1011       Patient was given sequential compression devices, early ambulation, and chemoprophylaxis to prevent DVT.  Patient benefited maximally from hospital stay and there were no complications.    Recent vital signs:  Patient Vitals for the past 24 hrs:  BP Temp Temp src Pulse Resp SpO2  07/23/20 0516 123/61 99.4 F (37.4 C) Oral 88 18 93 %  07/22/20 2008 (!) 144/69 99.1 F (37.3 C) Oral  91 16 95 %  07/22/20 1830 108/69 98.4 F (36.9 C) - 88 16 96 %  07/22/20 1537 (!) 122/100 (!) 97.5 F (36.4 C) - 61 16 98 %  07/22/20 1445 (!) 109/55 (!) 97.2 F (36.2 C) - (!) 57 16 95 %  07/22/20 1415 119/66 - - 65 19 97 %  07/22/20 1345 90/65 - - 75 (!) 21 96 %  07/22/20 1320 122/90 - - 79 13 94 %  07/22/20 1315 (!) 85/55 - - 85 (!) 22 97 %  07/22/20 1225 100/64 - - - 17 95 %  07/22/20 1210 117/75 - - 82 11 93 %     Recent laboratory studies:  Recent Labs    07/23/20 0141  WBC 7.2  HGB 9.9*  HCT 29.0*  PLT 174  NA 136  K 3.7  CL 104  CO2 26  BUN 9  CREATININE 0.81  GLUCOSE 118*  CALCIUM 8.7*     Discharge Medications:   Allergies as of 07/23/2020   No Known Allergies     Medication List    TAKE these medications   aspirin 325 MG EC tablet Take 1 tablet (325 mg total) by mouth 2 (two) times daily after a meal.   cetirizine 10 MG tablet Commonly known as: ZYRTEC Take 1 tablet (10 mg total) by mouth daily.   cholecalciferol 1000 units tablet Commonly known as: VITAMIN D Take 1,000 Units by mouth daily.   clotrimazole-betamethasone cream Commonly known as:  Lotrisone Apply 1 application topically 2 (two) times daily.   methocarbamol 500 MG tablet Commonly known as: ROBAXIN Take 1 tablet (500 mg total) by mouth every 6 (six) hours as needed for muscle spasms.   omeprazole 20 MG capsule Commonly known as: PRILOSEC Take 20 mg by mouth daily.   oxyCODONE 5 MG immediate release tablet Commonly known as: Oxy IR/ROXICODONE Take 1-2 tablets (5-10 mg total) by mouth every 4 (four) hours as needed for moderate pain (pain score 4-6).   vitamin B-12 1000 MCG tablet Commonly known as: CYANOCOBALAMIN Take 1,000 mcg by mouth daily.            Durable Medical Equipment  (From admission, onward)         Start     Ordered   07/22/20 1512  DME 3 n 1  Once        07/22/20 1511   07/22/20 1512  DME Walker rolling  Once       Question Answer Comment   Walker: With 5 Inch Wheels   Patient needs a walker to treat with the following condition Status post total left knee replacement      07/22/20 1511          Diagnostic Studies: DG Knee Left Port  Result Date: 07/22/2020 CLINICAL DATA:  Status post left knee replacement. EXAM: PORTABLE LEFT KNEE - 1-2 VIEW COMPARISON:  March 24, 2020. FINDINGS: The left femoral and tibial components appear to be well situated. Expected postoperative changes are noted in the soft tissues anteriorly. IMPRESSION: Status post left total knee arthroplasty. Electronically Signed   By: Lupita Raider M.D.   On: 07/22/2020 12:44    Disposition: Discharge disposition: 01-Home or Self Care         Follow-up Information    Kathryne Hitch, MD Follow up in 2 week(s).   Specialty: Orthopedic Surgery Contact information: [address] [place] [address] [phone]                Signed: Richardean Canal 07/23/2020, 9:42 AM

## 2020-07-23 NOTE — Op Note (Signed)
[name], [name] MEDICAL RECORD DZ:[id number] ACCOUNT [id number] DATE OF BIRTH:05-Sep-1945 FACILITY: [facility] LOCATION: [facility] PHYSICIAN:Genelle Economou Aretha Parrot, MD  OPERATIVE REPORT  DATE OF PROCEDURE:  07/22/2020  PREOPERATIVE DIAGNOSES:  Primary osteoarthritis and degenerative joint disease, left knee.  POSTOPERATIVE DIAGNOSES:  Primary osteoarthritis and degenerative joint disease, left knee.  PROCEDURE:  Left total knee arthroplasty.  IMPLANTS:  [employer]ker Triathlon cemented knee system with size 5 femur, size 5 tibia universal baseplate, 16 mm thickness fixed bearing polyethylene insert, size 32 patellar button.  SURGEON:  Vanita Panda. Magnus Ivan, MD  ASSISTANT:  Richardean Canal, PA-C  ANESTHESIA: 1.  Left lower extremity adductor canal block. 2.  Spinal.  TOURNIQUET TIME:  Eighty minutes.  BLOOD LOSS:  100 mL.  COMPLICATIONS:  None.  INDICATIONS:  The patient is a 74 year old gentleman with debilitating arthritis involving his left knee.  I followed him for a long period of time because his BMI was well above 40.  He is also diabetic.  He has worked on weight loss and activity  modification.  He has eventually gotten to where his BMI is now below 40.  His pain with his left knee is daily.  He has significant varus malalignment and bone-on-bone arthritis with that knee on x-rays.  We have tried and failed all forms of  conservative treatment for well over a year now.  At this point, he does wish to proceed with a total knee arthroplasty.  He understands this will still be a difficult case given his weight.  He understands our goals are to decrease pain, improve  mobility and overall improve quality of life.  We did talk about the risk of acute blood loss anemia, nerve and vessel injury, fracture, infection, DVT, and implant failure.  We talked about our goals being decrease pain, improve mobility and overall  improve quality of life.  DESCRIPTION OF PROCEDURE:  After informed  consent was obtained, appropriate left knee was marked.  An adductor canal block was obtained in the left lower extremity in the holding room.  He was then brought to the operating room and sat up on the  operating table where spinal anesthesia was obtained.  He was laid in supine position on the operating table.  Foley catheter was placed and nonsterile tourniquet was placed around the upper left thigh.  His left thigh, knee, leg, and ankle were prepped  and draped with DuraPrep and sterile drapes including a sterile stockinette.  Timeout was called and he was identified as correct patient, correct left knee.  We then used an Esmarch to wrap out that leg and tourniquet was inflated to 300 mm of pressure.   I then made a direct midline incision over the patella and carried this proximally and distally.  We dissected down the knee joint and carried out a medial parapatellar arthrotomy, finding a very large joint effusion and significant arthritis  throughout his knee.  His bone was soft, though.  With the knee in a flexed position, we removed remnants of ACL, PCL, medial and lateral meniscus.  We set our extramedullary cutting guide for making our proximal tibia cut, correcting for varus and  valgus and neutral slope.  We had taken 9 mm off the high side.  We made this cut without difficulty.  We then used an intramedullary guide for the femur for our distal femoral cut, setting this for a 10 mm distal femoral cut for the left knee at 5  degrees externally rotated.  We made this cut  without difficulty.  We brought the knee down to full extension and with a 9 mm extension block, he actually hyperextended.  We went back to the femur for putting our femoral sizing guide based off the  epicondylar axis and Whiteside's line.  Based off this, we chose a size 5 femur.  We put a 4-in-1 cutting block for a size 5 femur, made our anterior and posterior cuts, followed by our chamfer cuts.  We then made our femoral box  cut.  Attention was  turned back to the tibia.  We chose a size 5 tibial tray for coverage.  Given his soft bone and his deficient popliteus tendon, I elected to choose the universal baseplate from [employer].  We did our keel punch and our drill hole for this.  With a size 5  tibia, we trialed a left 5 femur and then a 9 mm fixed bearing polyethylene insert.  Obviously hyperextended.  We knew that it was likely going to be a size 13 insert as our final insert.  We then drilled 3 holes for a size 32 patellar button.  We then  removed all instrumentation from the knee and irrigated the knee with normal saline solution using pulsatile lavage.  We mixed our cement and then cemented our real [employer]ker Triathlon universal tibial baseplate size 5 and our size 5 left femur.  We placed  our 13 mm fixed bearing polyethylene insert and cemented our patellar button.  I then held the knee in a fully extended position and let the cement compress.  Once it hardened, I assessed the stability of the knee and I just felt like there was still  just enough play in the knee, that we should upsize the poly.  We removed fixed bearing 13 mm insert for a size 5 tibia and chose a size 16 insert and placed this and I was pleased with range of motion and stability with a size 16 insert.  We did let the  tourniquet down.  Hemostasis obtained with electrocautery.  We closed the arthrotomy with interrupted #1 Vicryl suture followed by 0 Vicryl to close the deep tissue and 2-0 Vicryl to close the subcutaneous tissue.  The skin was reapproximated with  staples.  Xeroform and well-padded sterile dressing was applied.  He was taken off the operating table and taken to recovery room in stable condition with all final counts being correct.  No complications noted.  Of note, Rexene Edison, PA-C, did assist  during the entire case and assistance was crucial for facilitating all aspects of this case.  HN/NUANCE  D:07/22/2020 T:07/23/2020  JOB:[id number]

## 2020-07-23 NOTE — TOC Initial Note (Signed)
Transition of Care [facility]) - Initial/Assessment Note    Patient Details  Name: Derek [name] MRN: [id number] Date of Birth: [birth year]  Transition of Care [facility]) CM/SW Contact:    Kingsley Plan, RN Phone Number: 07/23/2020, 12:55 PM  Clinical Narrative:                  Patient from home with family. 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 NCM ordered 3 in1 and walker with [employer] .   HHPT was pre arranged with [facility] patient in agreement  Expected Discharge Plan: Home w Home Health Services Barriers to Discharge: No Barriers Identified   Patient Goals and CMS Choice Patient states their goals for this hospitalization and ongoing recovery are:: to return to home CMS Medicare.gov Compare Post Acute Care list provided to:: Patient Choice offered to / list presented to : Patient  Expected Discharge Plan and Services Expected Discharge Plan: Home w Home Health Services     Post Acute Care Choice: Home Health Living arrangements for the past 2 months: Single Family Home Expected Discharge Date: 07/23/20               DME Arranged: Cherre Huger rolling DME Agency: [employer] Date DME Agency Contacted: 07/23/20 Time DME Agency Contacted: 1254 Representative spoke with at DME Agency: Velna Hatchet HH Arranged: PT HH Agency: [facility] (now [facility]) Date HH Agency Contacted: 07/23/20 Time HH Agency Contacted: 1254 Representative spoke with at [facility] Agency: Rosey Bath  Prior Living Arrangements/Services Living arrangements for the past 2 months: Single Family Home Lives with:: Spouse Patient language and need for interpreter reviewed:: Yes Do you feel safe going back to the place where you live?: Yes      Need for Family Participation in Patient Care: Yes (Comment) Care giver support system in place?: Yes (comment)   Criminal Activity/Legal Involvement Pertinent to Current Situation/Hospitalization: No - Comment as  needed  Activities of Daily Living Home Assistive Devices/Equipment: Dan Humphreys (specify type) ADL Screening (condition at time of admission) Patient's cognitive ability adequate to safely complete daily activities?: Yes Is the patient deaf or have difficulty hearing?: No Does the patient have difficulty seeing, even when wearing glasses/contacts?: No Does the patient have difficulty concentrating, remembering, or making decisions?: No Patient able to express need for assistance with ADLs?: Yes Does the patient have difficulty dressing or bathing?: No Independently performs ADLs?: Yes (appropriate for developmental age) Does the patient have difficulty walking or climbing stairs?: Yes Weakness of Legs: Both Weakness of Arms/Hands: None  Permission Sought/Granted   Permission granted to share information with : No              Emotional Assessment Appearance:: Appears stated age Attitude/Demeanor/Rapport: Engaged Affect (typically observed): Accepting Orientation: : Oriented to Self, Oriented to Place, Oriented to  Time, Oriented to Situation Alcohol / Substance Use: Not Applicable Psych Involvement: No (comment)  Admission diagnosis:  Status post total left knee replacement [Z96.652] Patient Active Problem List   Diagnosis Date Noted  . Status post total left knee replacement 07/22/2020  . Unilateral primary osteoarthritis, left knee 03/24/2020  . Knee pain, chronic 06/15/2016  . GERD (gastroesophageal reflux disease)   . Morbid obesity (HCC)   . Urethritis 03/20/2013   PCP:  Dettinger, Elige Radon, MD Pharmacy:   [facility] [address], [place] - [address] [place] [address] Phone: [phone] Fax: [phone]  Social Determinants of Health (SDOH) Interventions    Readmission Risk Interventions No flowsheet data found.

## 2020-07-23 NOTE — Progress Notes (Addendum)
Physical Therapy Treatment (PM session) Patient Details Name: Derek [name] MRN: [id number] DOB: 1946/03/14 Today's Date: 07/23/2020    History of Present Illness Pt is a 74 y/o male s/p L TKA. PMH includes GERD and arthritis.     PT Comments    Pt up in chair on arrival, agreeable to therapy session and with good participation and tolerance for mobility in PM session. Pt needing slightly decreased assist with transfers compared with AM session, able to stand from chair with +42modA and increased time to perform. Primary session focus on progressing standing tolerance/gait distance and seated therapeutic exercises, pt able to progress to 67ft gait (chair>bed) however able to slightly clear RLE from floor multiple reps, which is an improvement from AM session, although pt continues to demonstrate heavy trunk flexion and posterior lean. Pt performed seated LLE therapeutic exercises with good tolerance, and L knee ROM improved to 0-75 deg in afternoon, pt reporting mild to moderate pain throughout. Pt continues to benefit from PT services to progress toward functional mobility goals. D/C recs below, updated per pt progress and discussion with MD Magnus Ivan and Supervising PT [place]; anticipate pt will need at least 1-2 more sessions prior to being ready for D/C home.  Follow Up Recommendations  Follow surgeon's recommendation for DC plan and follow-up therapies;Supervision/Assistance - 24 hour (HHPT vs SNF if unable to progress gait/steps)     Equipment Recommendations  3in1 (PT);Wheelchair (measurements PT);Wheelchair cushion (measurements PT) (if home, pt would benefit from DME above)    Recommendations for Other Services OT consult     Precautions / Restrictions Precautions Precautions: Knee Precaution Booklet Issued: Yes (comment) Precaution Comments: Verbally reviewed knee precautions and given TKA handout. Required Braces or Orthoses: Knee Immobilizer - Left Restrictions Weight  Bearing Restrictions: Yes LLE Weight Bearing: Weight bearing as tolerated    Mobility  Bed Mobility Overal bed mobility: Needs Assistance Bed Mobility: Sit to Supine       Sit to supine: Min assist   General bed mobility comments: cues for hooking RLE under LLE and minA for safety/technique; increased time to perform but pt using bed rails for trunk positioning  Transfers Overall transfer level: Needs assistance Equipment used: Rolling walker (2 wheeled) Transfers: Sit to/from [employer] Sit to Stand: Mod assist;+2 physical assistance Stand pivot transfers: Max assist;+2 physical assistance       General transfer comment: pt performed stand pivot from chair height to RW>bed, able to take 4-5 steps but needs consistent cues for upright posture and safety as pt attempting to sit prior to BLE proximity to bed surface; decreased activity tolerance and LLE weakness limiting standing tolerance  Ambulation/Gait Ambulation/Gait assistance: Mod assist;+2 physical assistance Gait Distance (Feet): 3 Feet Assistive device: Rolling walker (2 wheeled) Gait Pattern/deviations: Step-to pattern;Shuffle     General Gait Details: difficulty lifting RLE due to LLE pain, pivoting/shuffling small steps to perform; unable to progress further 2/2 fatigue   Stairs             Wheelchair Mobility    Modified Rankin (Stroke Patients Only)       Balance Overall balance assessment: Needs assistance Sitting-balance support: No upper extremity supported Sitting balance-Leahy Scale: Fair Sitting balance - Comments: static sitting and weight shifting, needs BUE support for dynamic seated tasks   Standing balance support: Bilateral upper extremity supported;During functional activity Standing balance-Leahy Scale: Poor Standing balance comment: Reliant on BUE support and external assist +2; posterior lean/forward trunk flexion throughout  Cognition Arousal/Alertness: Awake/alert Behavior During Therapy: WFL for tasks assessed/performed Overall Cognitive Status: No family/caregiver present to determine baseline cognitive functioning                                 General Comments: pt requires increased time to initiate and perform mobility tasks and needs reinforcement of mobility cues      Exercises Total Joint Exercises Ankle Circles/Pumps: AROM;Strengthening;Both;15 reps;Supine Long Arc Quad: AROM;Strengthening;Left;10 reps;Seated Knee Flexion: AROM;Strengthening;Left;10 reps;Seated (with washcloth under L foot to allow for slide) Goniometric ROM: 0 to 75 deg seated EOB in PM session    General Comments        Pertinent Vitals/Pain Pain Assessment: Faces Faces Pain Scale: Hurts little more Pain Location: L knee Pain Descriptors / Indicators: Aching;Operative site guarding;Numbness Pain Intervention(s): Monitored during session;Premedicated before session;Repositioned;Ice applied    Home Living                      Prior Function            PT Goals (current goals can now be found in the care plan section) Acute Rehab PT Goals Patient Stated Goal: to go home PT Goal Formulation: With patient Time For Goal Achievement: 08/05/20 Potential to Achieve Goals: Good Progress towards PT goals: Progressing toward goals (slow progress)    Frequency    7X/week      PT Plan Current plan remains appropriate    Co-evaluation              AM-PAC PT "6 Clicks" Mobility   Outcome Measure  Help needed turning from your back to your side while in a flat bed without using bedrails?: A Little Help needed moving from lying on your back to sitting on the side of a flat bed without using bedrails?: A Little Help needed moving to and from a bed to a chair (including a wheelchair)?: A Lot Help needed standing up from a chair using your arms (e.g., wheelchair or bedside chair)?: A  Lot Help needed to walk in hospital room?: Total Help needed climbing 3-5 steps with a railing? : Total 6 Click Score: 12    End of Session Equipment Utilized During Treatment: Gait belt;Left knee immobilizer Activity Tolerance: Patient tolerated treatment well Patient left: in bed;with call bell/phone within reach;with bed alarm set (iceman donned) Nurse Communication: Mobility status;Precautions PT Visit Diagnosis: Unsteadiness on feet (R26.81);Muscle weakness (generalized) (M62.81);Pain Pain - Right/Left: Left Pain - part of body: Knee     Time: 7824-2353 PT Time Calculation (min) (ACUTE ONLY): 23 min  Charges:  $Gait Training: 8-22 mins $Therapeutic Exercise: 8-22 mins                     Doniesha Landau P., PTA Acute Rehabilitation Services Pager: [phone] Office: [phone]   Angus Palms 07/23/2020, 4:21 PM

## 2020-07-24 ENCOUNTER — Encounter (HOSPITAL_COMMUNITY): Payer: Self-pay | Admitting: Orthopaedic Surgery

## 2020-07-24 LAB — CBC
HCT: 29.8 % — ABNORMAL LOW (ref 39.0–52.0)
Hemoglobin: 10.2 g/dL — ABNORMAL LOW (ref 13.0–17.0)
MCH: 32.4 pg (ref 26.0–34.0)
MCHC: 34.2 g/dL (ref 30.0–36.0)
MCV: 94.6 fL (ref 80.0–100.0)
Platelets: 163 10*3/uL (ref 150–400)
RBC: 3.15 MIL/uL — ABNORMAL LOW (ref 4.22–5.81)
RDW: 12.8 % (ref 11.5–15.5)
WBC: 12.2 10*3/uL — ABNORMAL HIGH (ref 4.0–10.5)
nRBC: 0 % (ref 0.0–0.2)

## 2020-07-24 NOTE — Progress Notes (Signed)
Subjective: 2 Days Post-Op Procedure(s) (LRB): LEFT TOTAL KNEE ARTHROPLASTY (Left) Patient reports pain as moderate.  Working slowly with physical therapy.  They have recommended a wheelchair as well for home.  He would rather go home than go to short-term skilled nursing and he is adamant about this.  His vital signs are stable.  He does have acute blood loss anemia from the surgery but is asymptomatic.  Objective: Vital signs in last 24 hours: Temp:  [98.7 F (37.1 C)-98.8 F (37.1 C)] 98.8 F (37.1 C) (12/09 0500) Pulse Rate:  [93-96] 96 (12/09 0500) Resp:  [17-18] 18 (12/09 0500) BP: (121-130)/(58-72) 130/72 (12/09 0500) SpO2:  [94 %-99 %] 99 % (12/09 0500)  Intake/Output from previous day: 12/08 0701 - 12/09 0700 In: 480 [P.O.:480] Out: 500 [Urine:500] Intake/Output this shift: No intake/output data recorded.  Recent Labs    07/23/20 0141 07/24/20 0220  HGB 9.9* 10.2*   Recent Labs    07/23/20 0141 07/24/20 0220  WBC 7.2 12.2*  RBC 3.05* 3.15*  HCT 29.0* 29.8*  PLT 174 163   Recent Labs    07/23/20 0141  NA 136  K 3.7  CL 104  CO2 26  BUN 9  CREATININE 0.81  GLUCOSE 118*  CALCIUM 8.7*   No results for input(s): LABPT, INR in the last 72 hours.  Sensation intact distally Intact pulses distally Dorsiflexion/Plantar flexion intact Incision: scant drainage No cellulitis present Compartment soft   Assessment/Plan: 2 Days Post-Op Procedure(s) (LRB): LEFT TOTAL KNEE ARTHROPLASTY (Left) Up with therapy Discharge home with home health this afternoon.      Kathryne Hitch 07/24/2020, 7:53 AM

## 2020-07-24 NOTE — Progress Notes (Signed)
    Durable Medical Equipment  (From admission, onward)         Start     Ordered   07/24/20 1029  For home use only DME standard manual wheelchair with seat cushion  Once       Comments: Patient suffers from LEFT TOTAL KNEE ARTHROPLASTY  which impairs their ability to perform daily activities like ambulating  in the home.  A cane will not resolve issue with performing activities of daily living. A wheelchair will allow patient to safely perform daily activities. Patient can safely propel the wheelchair in the home or has a caregiver who can provide assistance. Length of need life time . Accessories: elevating leg rests (ELRs), wheel locks, extensions and anti-tippers.  Back and seat cushion   07/24/20 1030   07/24/20 0752  For home use only DME high strength lightweight manual wheelchair with seat cushion  Once       Comments: Patient suffers from knee arthritis and is status-post a left knee replacement which impairs their ability to perform daily activities like bathing, dressing, feeding, grooming, and toileting in the home.  A walker will not resolve  issue with performing activities of daily living. A wheelchair will allow patient to safely perform daily activities.Length of need 6 months . (THEN ONE OF THESE TWO:) Patient requires a size of large wheelchair which is not available in a standard or lightweight wheelchair and patient spends at least two hours per day in their chair. Accessories: elevating leg rests (ELRs), wheel locks, extensions and anti-tippers.   07/24/20 2197

## 2020-07-24 NOTE — Progress Notes (Signed)
Physical Therapy Treatment Patient Details Name: Derek [name] MRN: [id number] DOB: [birth year] Today's Date: 07/24/2020    History of Present Illness Pt is a 74 y/o male s/p L TKA. PMH includes GERD and arthritis.     PT Comments    Pt supine on arrival, agreeable to therapy session and with good participation in session. Primary session focus on safety with mobility/transfers and BLE strengthening. Pt needed increased assist for bed mobility with HOB flat and no rails per home setup (+58modA for trunk support). Pt performed sit<>stand x5 reps, and reports feeling improved stability in stance once L knee immobilizer donned. Pt able to perform SAQ unassisted on LLE but does have increased L quad lag during SLR this session (10-20 deg), pt reports increased pain which may be a factor. Pt needing heavy cues for safety/technique with all transfers and would benefit from short term low to moderate intensity post-acute rehab upon discharge however pt refusing this recommendation currently, if DC home will need strict 24/7 physical assist for all mobility tasks from 1-2 persons capable of physical assist, and likely will need to DC at wheelchair level due to inability to progress gait.   Follow Up Recommendations  Follow surgeon's recommendation for DC plan and follow-up therapies;Supervision/Assistance - 24 hour     Equipment Recommendations  3in1 (PT);Wheelchair (measurements PT);Wheelchair cushion (measurements PT);Rolling walker with 5" wheels    Recommendations for Other Services       Precautions / Restrictions Precautions Precautions: Knee Precaution Booklet Issued: Yes (comment) Precaution Comments: Verbally reviewed knee precautions and given TKA handout. Required Braces or Orthoses: Knee Immobilizer - Left Restrictions Weight Bearing Restrictions: Yes LLE Weight Bearing: Weight bearing as tolerated    Mobility  Bed Mobility Overal bed mobility: Needs Assistance Bed Mobility:  Rolling;Supine to Sit Rolling: Supervision   Supine to sit: Mod assist;+2 for physical assistance     General bed mobility comments: cues for log roll technique with flat HOB and no rails per home setup, pt unable to rise without significant BUE support and pulling up with staff member assist; pt reports increased LBP from sleeping position in bed overnight  Transfers Overall transfer level: Needs assistance Equipment used: Rolling walker (2 wheeled) Transfers: Sit to/from [employer] Sit to Stand: Mod assist;+2 safety/equipment;From elevated surface Stand pivot transfers: Mod assist;+2 physical assistance       General transfer comment: pt needing modA at L side due to significant L lean when rising from EOB (EOB height elevated 2-3" per home setup), second therapist present to assist with stabilizing equipment and providing verbal/tactile cues for safety; STS x 5 reps from EOB, then SPT with +38modA due to need to manage RW and tactile cues for improved body mechanics due to frequent trunk flexion/posterior lean  Ambulation/Gait Ambulation/Gait assistance: Mod assist;+2 physical assistance Gait Distance (Feet): 3 Feet Assistive device: Rolling walker (2 wheeled) Gait Pattern/deviations: Step-to pattern;Shuffle Gait velocity: decreased; limited distance   General Gait Details: difficulty lifting RLE due to LLE pain, pivoting/shuffling small steps to perform; unable to progress further 2/2 fatigue   Stairs             Wheelchair Mobility    Modified Rankin (Stroke Patients Only)       Balance Overall balance assessment: Needs assistance Sitting-balance support: No upper extremity supported Sitting balance-Leahy Scale: Fair     Standing balance support: Bilateral upper extremity supported;During functional activity Standing balance-Leahy Scale: Poor Standing balance comment: Reliant on BUE support and external assist +  2; posterior lean/forward trunk  flexion throughout                            Cognition Arousal/Alertness: Awake/alert Behavior During Therapy: WFL for tasks assessed/performed Overall Cognitive Status: No family/caregiver present to determine baseline cognitive functioning                                 General Comments: pt requires increased time to initiate and perform mobility tasks and needs reinforcement of mobility cues; pt with limited insight into deficits and decreased safety awareness      Exercises Total Joint Exercises Ankle Circles/Pumps: AROM;Strengthening;Both;15 reps;Supine Quad Sets: AROM;Strengthening;10 reps;Supine;Left Short Arc Quad: AROM;Strengthening;Left;10 reps;Supine Heel Slides: AAROM;Strengthening;Left;10 reps;Supine Hip ABduction/ADduction: AAROM;Strengthening;Left;10 reps;Supine Straight Leg Raises: AAROM;Strengthening;Left;10 reps;Supine Goniometric ROM: L knee ROM grossly 5 to 50 deg in supine Other Exercises Other Exercises: STS x5 for BLE strengthening    General Comments General comments (skin integrity, edema, etc.): pt with improved standing/stepping tolerance with L KI donned; R foot pt reports chronic limited DF      Pertinent Vitals/Pain Pain Assessment: Faces Faces Pain Scale: Hurts little more Pain Location: L knee Pain Descriptors / Indicators: Aching;Sore Pain Intervention(s): Monitored during session;Premedicated before session;Repositioned (ice refilled, OT to help pt don at end of session)    Home Living                      Prior Function            PT Goals (current goals can now be found in the care plan section) Acute Rehab PT Goals Patient Stated Goal: to go home PT Goal Formulation: With patient Time For Goal Achievement: 08/05/20 Potential to Achieve Goals: Fair Progress towards PT goals: Progressing toward goals (slow progress)    Frequency    7X/week      PT Plan Current plan remains appropriate     Co-evaluation PT/OT/SLP Co-Evaluation/Treatment: Yes Reason for Co-Treatment: Necessary to address cognition/behavior during functional activity;To address functional/ADL transfers PT goals addressed during session: Mobility/safety with mobility;Balance;Proper use of DME;Strengthening/ROM        AM-PAC PT "6 Clicks" Mobility   Outcome Measure  Help needed turning from your back to your side while in a flat bed without using bedrails?: Total Help needed moving from lying on your back to sitting on the side of a flat bed without using bedrails?: A Lot Help needed moving to and from a bed to a chair (including a wheelchair)?: A Lot Help needed standing up from a chair using your arms (e.g., wheelchair or bedside chair)?: A Lot Help needed to walk in hospital room?: A Lot Help needed climbing 3-5 steps with a railing? : Total 6 Click Score: 10    End of Session Equipment Utilized During Treatment: Gait belt;Left knee immobilizer Activity Tolerance: Patient tolerated treatment well Patient left: in chair;with call bell/phone within reach;Other (comment) (OT present in room assisting pt with ADL tasks) Nurse Communication: Mobility status PT Visit Diagnosis: Unsteadiness on feet (R26.81);Muscle weakness (generalized) (M62.81);Pain Pain - Right/Left: Left Pain - part of body: Knee     Time: 7017-7939 PT Time Calculation (min) (ACUTE ONLY): 28 min  Charges:  $Therapeutic Exercise: 8-22 mins                     Derek [name] P., PTA Acute Rehabilitation Services  Pager: [phone] Office: [phone]   Derek [name] Derek [name] 07/24/2020, 10:43 AM

## 2020-07-24 NOTE — TOC Progression Note (Signed)
Transition of Care [facility]) - Progression Note    Patient Details  Name: Ranson Belluomini MRN: [id number] Date of Birth: 12-28-1945  Transition of Care [facility]) CM/SW Contact  Jayliah Benett, Adria Devon, RN Phone Number: 07/24/2020, 10:26 AM  Clinical Narrative:     Jackelyn Knife with [employer] and ordered wheel chair    Expected Discharge Plan: Home w Home Health Services Barriers to Discharge: No Barriers Identified  Expected Discharge Plan and Services Expected Discharge Plan: Home w Home Health Services     Post Acute Care Choice: Home Health Living arrangements for the past 2 months: Single Family Home Expected Discharge Date: 07/24/20               DME Arranged: 3-N-1,Walker rolling DME Agency: [employer] Date DME Agency Contacted: 07/23/20 Time DME Agency Contacted: [id number] Representative spoke with at DME Agency: Velna Hatchet HH Arranged: PT HH Agency: [facility] (now [facility]) Date HH Agency Contacted: 07/23/20 Time HH Agency Contacted: [id number] Representative spoke with at [facility] Agency: Rosey Bath   Social Determinants of Health (SDOH) Interventions    Readmission Risk Interventions No flowsheet data found.

## 2020-07-24 NOTE — Care Management (Cosign Needed)
durable medical equipment 

## 2020-07-24 NOTE — Plan of Care (Signed)
  Problem: Education: Goal: Knowledge of General Education information will improve Description: Including pain rating scale, medication(s)/side effects and non-pharmacologic comfort measures Outcome: Adequate for Discharge   Problem: Clinical Measurements: Goal: Ability to maintain clinical measurements within normal limits will improve Outcome: Adequate for Discharge   Problem: Safety: Goal: Ability to remain free from injury will improve Outcome: Adequate for Discharge   Problem: Skin Integrity: Goal: Risk for impaired skin integrity will decrease Outcome: Adequate for Discharge   Problem: Activity: Goal: Risk for activity intolerance will decrease Outcome: Adequate for Discharge

## 2020-07-24 NOTE — Evaluation (Signed)
Occupational Therapy Evaluation Patient Details Name: Derek [name] MRN: [id number] DOB: Dec 11, 1945 Today's Date: 07/24/2020    History of Present Illness Pt is a 74 y/o male s/p L TKA. PMH includes GERD and arthritis.    Clinical Impression   PTA, pt was living alone and independent with use of RW; reports family and friends will be available at dc. Pt currently requiring Mod A for LB ADLs and Mod A +2 for functional tranfers with RW. Pt presenting with decreased strength and balance impacting his safe performance of ADLs and mobility; pt with tendency to lean forward significantly on RW making him at higher risk for falls. Pt with decreased awareness of deficits and the impact on safety. Very agreeable to therapy. Providing education on LB ADLs, toileting, and functional transfers. Pt would benefit from further acute OT to facilitate safe dc. Recommend dc to SNF for further OT to optimize safety, independence with ADLs, and return to PLOF. However, pt declining further rehab saying he would prefer to go home with a w/c and he has "people to help me."     Follow Up Recommendations  SNF;Supervision/Assistance - 24 hour (Pt denying post-acute rehab. Will requiring increased support at home as he is a high fall risk) Will need HHOT.   Equipment Recommendations  Wheelchair (measurements OT);Wheelchair cushion (measurements OT);3 in 1 bedside commode    Recommendations for Other Services PT consult     Precautions / Restrictions Precautions Precautions: Knee Precaution Booklet Issued: Yes (comment) Precaution Comments: Verbally reviewed knee precautions and given TKA handout. Required Braces or Orthoses: Knee Immobilizer - Left Restrictions Weight Bearing Restrictions: Yes LLE Weight Bearing: Weight bearing as tolerated      Mobility Bed Mobility Overal bed mobility: Needs Assistance Bed Mobility: Rolling;Supine to Sit Rolling: Supervision   Supine to sit: Mod assist;+2 for physical  assistance     General bed mobility comments: cues for log roll technique with flat HOB and no rails per home setup, pt unable to rise without significant BUE support and pulling up with staff member assist; pt reports increased LBP from sleeping position in bed overnight    Transfers Overall transfer level: Needs assistance Equipment used: Rolling walker (2 wheeled) Transfers: Sit to/from [employer] Sit to Stand: Mod assist;+2 safety/equipment;From elevated surface;Min assist Stand pivot transfers: Mod assist;+2 physical assistance       General transfer comment: pt needing modA at L side due to significant L lean when rising from EOB (EOB height elevated 2-3" per home setup), second therapist present to assist with stabilizing equipment and providing verbal/tactile cues for safety; STS x 5 reps from EOB, then SPT with +40modA due to need to manage RW and tactile cues for improved body mechanics due to frequent trunk flexion/posterior lean    Balance Overall balance assessment: Needs assistance Sitting-balance support: No upper extremity supported Sitting balance-Leahy Scale: Fair     Standing balance support: Bilateral upper extremity supported;During functional activity Standing balance-Leahy Scale: Poor Standing balance comment: Reliant on BUE support and external assist +2; posterior lean/forward trunk flexion throughout                           ADL either performed or assessed with clinical judgement   ADL Overall ADL's : Needs assistance/impaired Eating/Feeding: Set up;Supervision/ safety;Sitting   Grooming: Set up;Supervision/safety;Sitting   Upper Body Bathing: Set up;Supervision/ safety;Sitting   Lower Body Bathing: Moderate assistance;Sit to/from stand Lower Body Bathing Details (indicate  cue type and reason): Recommend pt sponge bath at sink Upper Body Dressing : Set up;Supervision/safety;Sitting Upper Body Dressing Details (indicate cue  type and reason): don shirt Lower Body Dressing: Moderate assistance;Sit to/from stand Lower Body Dressing Details (indicate cue type and reason): Educating pt on compensatory techniques for LB dressing. Pt able to bend forward and don pants over foot. However, poor standing balance and unable to pull pants over hips or laterally lean to pull pants over hips.Requiring Mod A for managing pants over hips. Pt demonsatrating use of sock aide and reacher.             Functional mobility during ADLs: Minimal assistance;+2 for safety/equipment;Rolling walker (stand pivot only) General ADL Comments: Pt presenting with poor standing balance and decreased awareness of deficits impacting his safety     Vision         Perception     Praxis      Pertinent Vitals/Pain Pain Assessment: Faces Faces Pain Scale: Hurts little more Pain Location: L knee Pain Descriptors / Indicators: Aching;Sore Pain Intervention(s): Monitored during session;Repositioned;Limited activity within patient's tolerance     Hand Dominance     Extremity/Trunk Assessment Upper Extremity Assessment Upper Extremity Assessment: Overall WFL for tasks assessed   Lower Extremity Assessment Lower Extremity Assessment: Defer to PT evaluation LLE Deficits / Details: Deficits consistent with post op pain and weakness. Notable buckling even with use of KI.    Cervical / Trunk Assessment Cervical / Trunk Assessment: Normal   Communication Communication Communication: No difficulties   Cognition Arousal/Alertness: Awake/alert Behavior During Therapy: WFL for tasks assessed/performed Overall Cognitive Status: No family/caregiver present to determine baseline cognitive functioning                                 General Comments: Pt with decreased awareness of deficits and impacting on functional performance and safety   General Comments  pt with improved standing/stepping tolerance with L KI donned; R foot pt  reports chronic limited DF    Exercises    Shoulder Instructions      Home Living Family/patient expects to be discharged to:: Private residence Living Arrangements: Alone Available Help at Discharge: Friend(s) Type of Home: House Home Access: Stairs to enter Secretary/administrator of Steps: 1 Entrance Stairs-Rails: None Home Layout: One level     Bathroom Shower/Tub: Producer, television/film/video: Standard     Home Equipment: Emergency planning/management officer - 2 wheels          Prior Functioning/Environment Level of Independence: Independent with assistive device(s)        Comments: was using RW for ambulation        OT Problem List: Decreased strength;Decreased activity tolerance;Impaired balance (sitting and/or standing);Decreased knowledge of use of DME or AE;Decreased knowledge of precautions;Pain      OT Treatment/Interventions: Self-care/ADL training;Therapeutic exercise;DME and/or AE instruction;Energy conservation;Therapeutic activities;Patient/family education    OT Goals(Current goals can be found in the care plan section) Acute Rehab OT Goals Patient Stated Goal: to go home OT Goal Formulation: With patient Time For Goal Achievement: 08/07/20 Potential to Achieve Goals: Good  OT Frequency: Min 3X/week   Barriers to D/C:            Co-evaluation PT/OT/SLP Co-Evaluation/Treatment: Yes Reason for Co-Treatment: For patient/therapist safety;To address functional/ADL transfers PT goals addressed during session: Mobility/safety with mobility;Balance;Proper use of DME;Strengthening/ROM OT goals addressed during session: ADL's and self-care  AM-PAC OT "6 Clicks" Daily Activity     Outcome Measure Help from another person eating meals?: None Help from another person taking care of personal grooming?: A Little Help from another person toileting, which includes using toliet, bedpan, or urinal?: A Lot Help from another person bathing (including washing,  rinsing, drying)?: A Lot Help from another person to put on and taking off regular upper body clothing?: A Little Help from another person to put on and taking off regular lower body clothing?: A Lot 6 Click Score: 16   End of Session Equipment Utilized During Treatment: Rolling walker;Gait belt Nurse Communication: Mobility status  Activity Tolerance: Patient tolerated treatment well Patient left: in chair;with call bell/phone within reach  OT Visit Diagnosis: Other abnormalities of gait and mobility (R26.89);Muscle weakness (generalized) (M62.81);Unsteadiness on feet (R26.81)                Time: 4098-1191 OT Time Calculation (min): 53 min Charges:  OT General Charges $OT Visit: 1 Visit OT Evaluation $OT Eval Low Complexity: 1 Low OT Treatments $Self Care/Home Management : 23-37 mins  Yacqub Baston MSOT, OTR/L Acute Rehab Pager: [phone] Office: [phone]  Theodoro Grist Mattye Verdone 07/24/2020, 11:15 AM

## 2020-07-24 NOTE — Progress Notes (Signed)
Discharge patient. Reviewed medications and To Do lists. Discharged with equipments, rolling walker, bedside commode, ice machine. Questions answered.

## 2020-07-25 DIAGNOSIS — Z96652 Presence of left artificial knee joint: Secondary | ICD-10-CM | POA: Diagnosis not present

## 2020-07-25 DIAGNOSIS — M1712 Unilateral primary osteoarthritis, left knee: Secondary | ICD-10-CM | POA: Diagnosis not present

## 2020-07-28 ENCOUNTER — Other Ambulatory Visit: Payer: Self-pay

## 2020-07-28 DIAGNOSIS — Z96652 Presence of left artificial knee joint: Secondary | ICD-10-CM

## 2020-08-04 ENCOUNTER — Encounter: Payer: Self-pay | Admitting: Orthopaedic Surgery

## 2020-08-04 ENCOUNTER — Ambulatory Visit (INDEPENDENT_AMBULATORY_CARE_PROVIDER_SITE_OTHER): Payer: Medicare HMO | Admitting: Orthopaedic Surgery

## 2020-08-04 DIAGNOSIS — Z96652 Presence of left artificial knee joint: Secondary | ICD-10-CM

## 2020-08-04 NOTE — Progress Notes (Signed)
The patient comes in today 2 weeks tomorrow status post a left total knee arthroplasty.  He reports that he is doing very well.  He has been on his aspirin twice a day.  He denies any significant pain and says home therapies are working with him.  He is ready to transition outpatient therapy.  He says he does not need a refill of pain medicine right now.  He was not on aspirin before surgery.  Examination of his left knee shows he lacks full extension by few degrees and is already flexion to 90 degrees easily.  His calf is soft.  I removed the staples from his left knee incision in placed Steri-Strips.  At this point we will have him transition outpatient physical therapy.  I will see him back in 4 weeks to see how he is doing overall.  If there are any issues before then he will let us know.  All questions and concerns were answered and addressed.

## 2020-08-05 ENCOUNTER — Other Ambulatory Visit: Payer: Self-pay

## 2020-08-05 ENCOUNTER — Ambulatory Visit (INDEPENDENT_AMBULATORY_CARE_PROVIDER_SITE_OTHER): Payer: Medicare HMO | Admitting: Physical Therapy

## 2020-08-05 DIAGNOSIS — M25562 Pain in left knee: Secondary | ICD-10-CM

## 2020-08-05 DIAGNOSIS — M25662 Stiffness of left knee, not elsewhere classified: Secondary | ICD-10-CM

## 2020-08-05 DIAGNOSIS — R600 Localized edema: Secondary | ICD-10-CM

## 2020-08-05 DIAGNOSIS — R262 Difficulty in walking, not elsewhere classified: Secondary | ICD-10-CM

## 2020-08-05 NOTE — Therapy (Signed)
[facility] Physical Therapy [address] [place], [place], [address] Phone: [phone]   Fax:  [phone]  Physical Therapy Evaluation  Patient Details  Name: Derek [name] MRN: [id number] Date of Birth: October 13, 1945 Referring Provider (PT): Rise Paganini MD   Encounter Date: 08/05/2020   PT End of Session - 08/05/20 1626    Visit Number 1    Number of Visits 16    Date for PT Re-Evaluation 10/10/20    Progress Note Due on Visit 10    PT Start Time 1205    PT Stop Time 1235    PT Time Calculation (min) 30 min    Equipment Utilized During Treatment Gait belt;Left knee immobilizer    Activity Tolerance Patient tolerated treatment well    Behavior During Therapy [facility] for tasks assessed/performed           Past Medical History:  Diagnosis Date  . Arthritis    left knee  . GERD (gastroesophageal reflux disease)   . Hypercholesterolemia   . Obesity   . Urethritis    when he was in [employer]    Past Surgical History:  Procedure Laterality Date  . COLONOSCOPY    . TOTAL KNEE ARTHROPLASTY Left 07/22/2020  . TOTAL KNEE ARTHROPLASTY Left 07/22/2020   Procedure: LEFT TOTAL KNEE ARTHROPLASTY;  Surgeon: Kathryne Hitch, MD;  Location: [facility] OR;  Service: Orthopedics;  Laterality: Left;    There were no vitals filed for this visit.    Subjective Assessment - 08/05/20 1206    Subjective Pt arriving to therapy 20 minutes late reporting no pain at rest. Pt reporting increased pain with movements or standing. Pt states he has been doing his exercises that they gave him in the hospital. Pt arriving in wheel chair and stated he drove to therapy today.    Pertinent History OA, GERD, obesity, hypercholesterolemia, R leg surgery 30 years ago.    Limitations Walking;Standing;House hold activities;Other (comment)    Patient Stated Goals Walk without walker, Be able to straighten my knee    Currently in Pain? Yes    Pain Score 7    with bending   Pain Location  Knee    Pain Orientation Left    Pain Descriptors / Indicators Aching;Sore;Tightness    Pain Type Surgical pain;Acute pain    Pain Onset 1 to 4 weeks ago    Pain Frequency Intermittent    Aggravating Factors  tyring to straighten    Pain Relieving Factors sitting    Effect of Pain on Daily Activities diffiulty with sit to stand and transfers, family to help as needed              [facility] PT Assessment - 08/05/20 0001      Assessment   Medical Diagnosis L TKA    Referring Provider (PT) Rise Paganini MD    Onset Date/Surgical Date 07/22/20    Hand Dominance Right    Prior Therapy no      Precautions   Precautions None      Restrictions   Weight Bearing Restrictions No      Balance Screen   Has the patient fallen in the past 6 months No    Is the patient reluctant to leave their home because of a fear of falling?  No      Prior Function   Level of Independence Independent      Cognition   Overall Cognitive Status Within Functional Limits for tasks assessed  Observation/Other Assessments   Focus on Therapeutic Outcomes (FOTO)  --      Observation/Other Assessments-Edema    Edema Circumferential      Circumferential Edema   Circumferential - Right 47centimeters    Circumferential - Left  56 centimeters      Posture/Postural Control   Posture/Postural Control Postural limitations    Postural Limitations Rounded Shoulders;Forward head;Decreased lumbar lordosis;Increased thoracic kyphosis;Posterior pelvic tilt;Flexed trunk      ROM / Strength   AROM / PROM / Strength AROM;PROM;Strength      AROM   AROM Assessment Site Knee    Right/Left Knee Right;Left    Right Knee Extension 0    Right Knee Flexion 105    Left Knee Extension 15    Left Knee Flexion 80      PROM   Overall PROM Comments L Knee flexion: 85, extension -12 degrees      Strength   Overall Strength Comments extensor lag when attempting SLR on L LE    Strength Assessment Site Knee     Right/Left Knee Right;Left    Right Knee Flexion 4/5    Right Knee Extension 4/5    Left Knee Flexion 3/5    Left Knee Extension 2+/5      Palpation   Patella mobility mildly restricted due to swelling    Palpation comment TTP around L knee joint line      Transfers   Transfers Sit to Stand;Stand to Sit    Sit to Stand 4: Min assist    Sit to Stand Details Tactile cues for initiation;Tactile cues for sequencing    Sit to Stand Details (indicate cue type and reason) needed assistance for wheel chair placement when transferrring to the mat and needed assistance with proper hand placement for safety.      Ambulation/Gait   Gait Comments 3-4 steps using a RW with minimal assitance with instructions for posture correction to prevent trunk fleixon and for walker sequencing                      Objective measurements completed on examination: See above findings.                 PT Short Term Goals - 08/05/20 1627      PT SHORT TERM GOAL #1   Title Pt will be able to demonstrate correct set up of his wheelchair for transfers to improve safety.    Time 3    Period Weeks    Status New    Target Date 08/29/20      PT SHORT TERM GOAL #2   Title Pt will be able to perform L SLR with no extensor lag noted.    Baseline significant extensor lag noted when performing L SLR.    Time 3    Period Weeks    Status New    Target Date 08/29/20             PT Long Term Goals - 08/05/20 1629      PT LONG TERM GOAL #1   Title Pt will be able to perform sit to stand independently with lease restrictive assistive device.    Baseline 8    Period Weeks    Status New    Target Date 10/10/20      PT LONG TERM GOAL #2   Title Pt will be able to amb 150 feet with least restrictive device with pain </= 3/10.  Time 8    Period Weeks    Target Date 10/10/20      PT LONG TERM GOAL #3   Title Pt will improve his left knee extension to ,/= 5 degrees to improve gait.     Time 8    Status New      PT LONG TERM GOAL #4   Title Pt will improve his left knee flexion to >/= 115 degrees in order to improve functional mobility.                  Plan - 08/05/20 1601    Clinical Impression Statement Pt is a 74 year old male s/p Left TKA on 07/22/2020. Pt had to be assisted out of his vehicle by staff member requiring minimal assistance with pt pulling on wheelchair to assist with lift off. Pt has been using a rolling walker at home for transfers and getting his family to help. Pt reports he has a ramp to enter and his family pushes him in/out of his home. Pt presenting with weakness 2/5 in left quads, 3/ 5 in left knee flexion and 3/5 in left hip. Pt reporting increased pain with all movments but no pain at rest. Pt's AROM of L knee is 15 to 80 degrees. Pt unable to reach full standing position with sit to stand with bilateral knee flexion and trunk flexion. Pt could benefit from skilled PT to progress toward pt's PLOF.    Personal Factors and Comorbidities Comorbidity 3+;Other    Comorbidities obesity, arthritis, GERD, hypercholesteromemia    Examination-Activity Limitations Other;Lift;Stand;Stairs;Squat;Hygiene/Grooming    Examination-Participation Restrictions Community Activity;Other    Stability/Clinical Decision Making Stable/Uncomplicated    Clinical Decision Making Low    Rehab Potential Fair    PT Frequency 2x / week    PT Duration 8 weeks    PT Treatment/Interventions ADLs/Self Care Home Management;Electrical Stimulation;Cryotherapy;Moist Heat;Ultrasound;Balance training;Therapeutic exercise;Therapeutic activities;Functional mobility training;Stair training;Gait training;Neuromuscular re-education;Patient/family education;Passive range of motion;Scar mobilization;Manual techniques;Taping;Vasopneumatic Device    PT Next Visit Plan transfer training, LE strengthening, ROM left knee    PT Home Exercise Plan [id number]    Consulted and Agree with Plan of  Care Patient           Patient will benefit from skilled therapeutic intervention in order to improve the following deficits and impairments:  Pain,Postural dysfunction,Decreased mobility,Decreased strength,Impaired flexibility,Increased edema,Decreased activity tolerance,Difficulty walking,Obesity  Visit Diagnosis: Acute pain of left knee  Stiffness of left knee, not elsewhere classified  Difficulty in walking, not elsewhere classified  Localized edema     Problem List Patient Active Problem List   Diagnosis Date Noted  . Status post total left knee replacement 07/22/2020  . Unilateral primary osteoarthritis, left knee 03/24/2020  . Knee pain, chronic 06/15/2016  . GERD (gastroesophageal reflux disease)   . Morbid obesity (HCC)   . Urethritis 03/20/2013    Sharmon Leyden, PT, MPT 08/05/2020, 4:40 PM  [facility] Physical Therapy [address] [place], [place], [address] Phone: [phone]   Fax:  [phone]  Name: Karthik Whittinghill MRN: [id number] Date of Birth: June 18, 1946

## 2020-08-18 ENCOUNTER — Ambulatory Visit: Payer: Medicare HMO | Admitting: Rehabilitative and Restorative Service Providers"

## 2020-08-18 ENCOUNTER — Encounter: Payer: Self-pay | Admitting: Rehabilitative and Restorative Service Providers"

## 2020-08-18 ENCOUNTER — Other Ambulatory Visit: Payer: Self-pay

## 2020-08-18 DIAGNOSIS — M6281 Muscle weakness (generalized): Secondary | ICD-10-CM

## 2020-08-18 DIAGNOSIS — R600 Localized edema: Secondary | ICD-10-CM | POA: Diagnosis not present

## 2020-08-18 DIAGNOSIS — M25562 Pain in left knee: Secondary | ICD-10-CM

## 2020-08-18 DIAGNOSIS — M25662 Stiffness of left knee, not elsewhere classified: Secondary | ICD-10-CM

## 2020-08-18 DIAGNOSIS — R262 Difficulty in walking, not elsewhere classified: Secondary | ICD-10-CM

## 2020-08-18 NOTE — Therapy (Signed)
[facility] Physical Therapy [address] [place], [place], [address] Phone: [phone]   Fax:  [phone]  Physical Therapy Treatment  Patient Details  Name: Derek [name] MRN: [id number] Date of Birth: 09/12/1945 Referring Provider (PT): Rise Paganini MD   Encounter Date: 08/18/2020   PT End of Session - 08/18/20 1540    Visit Number 2    Number of Visits 16    Date for PT Re-Evaluation 10/10/20    Progress Note Due on Visit 10    PT Start Time 1345    PT Stop Time 1430    PT Time Calculation (min) 45 min    Equipment Utilized During Treatment Gait belt;Left knee immobilizer    Activity Tolerance Patient tolerated treatment well;No increased pain    Behavior During Therapy WFL for tasks assessed/performed           Past Medical History:  Diagnosis Date  . Arthritis    left knee  . GERD (gastroesophageal reflux disease)   . Hypercholesterolemia   . Obesity   . Urethritis    when he was in [employer]    Past Surgical History:  Procedure Laterality Date  . COLONOSCOPY    . TOTAL KNEE ARTHROPLASTY Left 07/22/2020  . TOTAL KNEE ARTHROPLASTY Left 07/22/2020   Procedure: LEFT TOTAL KNEE ARTHROPLASTY;  Surgeon: Kathryne Hitch, MD;  Location: [facility] OR;  Service: Orthopedics;  Laterality: Left;    There were no vitals filed for this visit.   Subjective Assessment - 08/18/20 1536    Subjective Derek [name] arrived in a wheelchair today.  He reports he is only using a walker to stand and transfer.    Pertinent History OA, GERD, obesity, hypercholesterolemia, R leg surgery 30 years ago.    Limitations Walking;Standing;House hold activities;Other (comment)    Patient Stated Goals Walk without walker, Be able to straighten my knee    Currently in Pain? Yes    Pain Score 7     Pain Location Knee    Pain Orientation Left    Pain Descriptors / Indicators Sore;Tightness    Pain Type Surgical pain;Chronic pain    Pain Onset 1 to 4 weeks ago    Pain  Frequency Intermittent    Aggravating Factors  Weight-bearing    Pain Relieving Factors Rest    Effect of Pain on Daily Activities Using a wheelchair the majority of the time, tight and weak L knee    Multiple Pain Sites No                             [facility] Adult PT Treatment/Exercise - 08/18/20 0001      Exercises   Exercises Knee/Hip      Knee/Hip Exercises: Stretches   Other Knee/Hip Stretches Tailgate knee flexion AROM 3 minutes      Knee/Hip Exercises: Aerobic   Stationary Bike Seat 6 8 minutes (took much longer to get him in pedals and strap in his feet)      Knee/Hip Exercises: Seated   Other Seated Knee/Hip Exercises Seated knee extension stretch (L foot in chair, foot in neutral) with self-overpressure 3 minutes      Knee/Hip Exercises: Supine   Quad Sets Strengthening;Both;2 sets;10 reps;Other (comment)    Quad Sets Limitations 5 seconds      Manual Therapy   Manual Therapy Joint mobilization    Manual therapy comments Into extension  PT Education - 08/18/20 1538    Education Details Talked about the importance of HEP compliance, focus on AROM (extension #1 flexion #2) and quadriceps strength.  Discussed the importance of using the walker and not relying on the wheelchair.    Person(s) Educated Patient    Methods Explanation;Tactile cues;Demonstration;Verbal cues;Handout    Comprehension Verbal cues required;Need further instruction;Returned demonstration;Verbalized understanding;Tactile cues required            PT Short Term Goals - 08/18/20 1539      PT SHORT TERM GOAL #1   Title Pt will be able to demonstrate correct set up of his wheelchair for transfers to improve safety.    Time 3    Period Weeks    Status On-going    Target Date 08/29/20      PT SHORT TERM GOAL #2   Title Pt will be able to perform L SLR with no extensor lag noted.    Baseline significant extensor lag noted when performing L SLR.    Time  3    Period Weeks    Status On-going    Target Date 08/29/20             PT Long Term Goals - 08/18/20 1540      PT LONG TERM GOAL #1   Title Pt will be able to perform sit to stand independently with least restrictive assistive device.    Baseline 8    Period Weeks    Status On-going      PT LONG TERM GOAL #2   Title Pt will be able to amb 150 feet with least restrictive device with pain </= 3/10.    Time 8    Period Weeks    Status On-going      PT LONG TERM GOAL #3   Title Pt will improve his left knee extension to /= 5 degrees to improve gait.    Time 8    Status On-going      PT LONG TERM GOAL #4   Title Pt will improve his left knee flexion to >/= 115 degrees in order to improve functional mobility.    Status On-going                 Plan - 08/18/20 1541    Clinical Impression Statement Keanthony did a good job with his clinic program today.  I encouraged him to do the same with his HEP.  Extension AROM is very limited as is flexion AROM.  Strength and function are limited and Braison reports using the wheelchair except for transfers with a walker.  I encouraged increased walker use and increased HEP compliance.  Continue POC.    Personal Factors and Comorbidities Comorbidity 3+;Other    Comorbidities obesity, arthritis, GERD, hypercholesteromemia    Examination-Activity Limitations Other;Lift;Stand;Stairs;Squat;Hygiene/Grooming    Examination-Participation Restrictions Community Activity;Other    Stability/Clinical Decision Making Stable/Uncomplicated    Rehab Potential Fair    PT Frequency 2x / week    PT Duration 8 weeks    PT Treatment/Interventions ADLs/Self Care Home Management;Electrical Stimulation;Cryotherapy;Moist Heat;Ultrasound;Balance training;Therapeutic exercise;Therapeutic activities;Functional mobility training;Stair training;Gait training;Neuromuscular re-education;Patient/family education;Passive range of motion;Scar mobilization;Manual  techniques;Taping;Vasopneumatic Device    PT Next Visit Plan transfer training, LE strengthening, ROM left knee    PT Home Exercise Plan [id number]    Consulted and Agree with Plan of Care Patient           Patient will benefit from skilled therapeutic intervention in order to  improve the following deficits and impairments:  Pain,Postural dysfunction,Decreased mobility,Decreased strength,Impaired flexibility,Increased edema,Decreased activity tolerance,Difficulty walking,Obesity  Visit Diagnosis: Difficulty walking  Muscle weakness (generalized)  Localized edema  Stiffness of left knee, not elsewhere classified  Left knee pain, unspecified chronicity     Problem List Patient Active Problem List   Diagnosis Date Noted  . Status post total left knee replacement 07/22/2020  . Unilateral primary osteoarthritis, left knee 03/24/2020  . Knee pain, chronic 06/15/2016  . GERD (gastroesophageal reflux disease)   . Morbid obesity ([place])   . Urethritis 03/20/2013    Farley Ly PT, MPT 08/18/2020, 3:43 PM  [facility] Physical Therapy [address] [place], [place], [address] Phone: [phone]   Fax:  [phone]  Name: Timithy Arons MRN: [id number] Date of Birth: 1945-12-22

## 2020-08-22 ENCOUNTER — Encounter: Payer: Medicare HMO | Admitting: Rehabilitative and Restorative Service Providers"

## 2020-08-22 ENCOUNTER — Telehealth: Payer: Self-pay | Admitting: Rehabilitative and Restorative Service Providers"

## 2020-08-22 NOTE — Telephone Encounter (Signed)
Called to check on and remind him of Monday 1:45 appointment.  He confirmed he would be here.

## 2020-08-25 ENCOUNTER — Encounter: Payer: Medicare HMO | Admitting: Physical Therapy

## 2020-08-25 DIAGNOSIS — M1712 Unilateral primary osteoarthritis, left knee: Secondary | ICD-10-CM | POA: Diagnosis not present

## 2020-08-25 DIAGNOSIS — Z96652 Presence of left artificial knee joint: Secondary | ICD-10-CM | POA: Diagnosis not present

## 2020-08-28 ENCOUNTER — Telehealth: Payer: Self-pay | Admitting: Rehabilitative and Restorative Service Providers"

## 2020-08-28 ENCOUNTER — Encounter: Payer: Medicare HMO | Admitting: Rehabilitative and Restorative Service Providers"

## 2020-08-28 NOTE — Telephone Encounter (Signed)
Called Derek [name] to check on him as today is his 2nd no show in a row post evaluation.  Mailbox was full.  Will need to DC if another no show.

## 2020-09-01 ENCOUNTER — Encounter: Payer: Medicare HMO | Admitting: Physical Therapy

## 2020-09-01 ENCOUNTER — Ambulatory Visit: Payer: Medicare HMO | Admitting: Orthopaedic Surgery

## 2020-09-03 ENCOUNTER — Encounter: Payer: Medicare HMO | Admitting: Rehabilitative and Restorative Service Providers"

## 2020-09-03 ENCOUNTER — Telehealth: Payer: Self-pay | Admitting: Rehabilitative and Restorative Service Providers"

## 2020-09-03 NOTE — Telephone Encounter (Signed)
Called to DC from PT (3 no shows in a row).  As the previous 2 times, no answer and voicemail is full.  All remaining appointments cancelled.

## 2020-09-08 ENCOUNTER — Encounter: Payer: Medicare HMO | Admitting: Physical Therapy

## 2020-09-10 ENCOUNTER — Ambulatory Visit (INDEPENDENT_AMBULATORY_CARE_PROVIDER_SITE_OTHER): Payer: Medicare HMO | Admitting: Orthopaedic Surgery

## 2020-09-10 ENCOUNTER — Encounter: Payer: Medicare HMO | Admitting: Rehabilitative and Restorative Service Providers"

## 2020-09-10 ENCOUNTER — Encounter: Payer: Self-pay | Admitting: Orthopaedic Surgery

## 2020-09-10 DIAGNOSIS — Z96652 Presence of left artificial knee joint: Secondary | ICD-10-CM

## 2020-09-10 NOTE — Progress Notes (Signed)
  The patient is now 7 weeks status post a left total knee arthroplasty.  He is 75 years old.  He is mobilizing very slowly with a rolling walker but he says he is doing very well and has no pain at all with his left operative knee.  On exam he has full extension and full flexion of his left knee.  His incision is healed nicely.  He does have some residual swelling to be expected but overall looks great.  He will continue to increase his activities as comfort allows.  I can see him back in 6 months to see how he is doing overall and we can have a final AP and lateral of his left operative knee at that visit.  All questions and concerns were answered and addressed.

## 2020-09-25 DIAGNOSIS — M1712 Unilateral primary osteoarthritis, left knee: Secondary | ICD-10-CM | POA: Diagnosis not present

## 2020-09-25 DIAGNOSIS — Z96652 Presence of left artificial knee joint: Secondary | ICD-10-CM | POA: Diagnosis not present

## 2020-10-23 DIAGNOSIS — Z96652 Presence of left artificial knee joint: Secondary | ICD-10-CM | POA: Diagnosis not present

## 2020-10-23 DIAGNOSIS — M1712 Unilateral primary osteoarthritis, left knee: Secondary | ICD-10-CM | POA: Diagnosis not present

## 2020-11-17 ENCOUNTER — Ambulatory Visit (INDEPENDENT_AMBULATORY_CARE_PROVIDER_SITE_OTHER): Payer: Medicare HMO | Admitting: Family Medicine

## 2020-11-17 ENCOUNTER — Other Ambulatory Visit: Payer: Self-pay

## 2020-11-17 ENCOUNTER — Encounter: Payer: Self-pay | Admitting: Family Medicine

## 2020-11-17 VITALS — BP 119/73 | HR 65 | Ht 66.0 in | Wt 253.0 lb

## 2020-11-17 DIAGNOSIS — R200 Anesthesia of skin: Secondary | ICD-10-CM

## 2020-11-17 DIAGNOSIS — Z23 Encounter for immunization: Secondary | ICD-10-CM

## 2020-11-17 DIAGNOSIS — R202 Paresthesia of skin: Secondary | ICD-10-CM

## 2020-11-17 LAB — BAYER DCA HB A1C WAIVED: HB A1C (BAYER DCA - WAIVED): 4.9 % (ref <7.0)

## 2020-11-17 NOTE — Progress Notes (Signed)
BP 119/73   Pulse 65   Ht 5' 6"  (1.676 m)   Wt 253 lb (114.8 kg)   SpO2 95%   BMI 40.84 kg/m    Subjective:   Patient ID: Derek [name], male    DOB: 11/28/1945, 75 y.o.   MRN: [id number]  HPI: Derek [name] is a 75 y.o. male presenting on 11/17/2020 for loss of sensation (Hands and geet. Started one year ago and getting worse)   HPI Patient is coming in today complaining of numbness and tingling in both his hands and his feet.  He has been seen issues with his hands for quite some time, sent all of his fingers without starting to get up into his hands and some into his wrist area as well.  But his feet started up more recently, it is both feet and he does say he feels and numbness there and then a tingling and burning.  He says the numbness in his hands prevents him from picking small things up sometimes because he cannot feel them.  He denies any pain.  He denies any major changes in diet.  He is taking his vitamin B12 daily.  Relevant past medical, surgical, family and social history reviewed and updated as indicated. Interim medical history since our last visit reviewed. Allergies and medications reviewed and updated.  Review of Systems  Constitutional: Negative for chills and fever.  Respiratory: Negative for shortness of breath and wheezing.   Cardiovascular: Negative for chest pain and leg swelling.  Skin: Negative for rash.  Neurological: Positive for numbness. Negative for weakness.  All other systems reviewed and are negative.   Per HPI unless specifically indicated above   Allergies as of 11/17/2020   No Known Allergies     Medication List       Accurate as of November 17, 2020 12:47 PM. If you have any questions, ask your nurse or doctor.        STOP taking these medications   methocarbamol 500 MG tablet Commonly known as: ROBAXIN Stopped by: Fransisca Kaufmann Cherl Gorney, MD   oxyCODONE 5 MG immediate release tablet Commonly known as: Oxy IR/ROXICODONE Stopped by:  Fransisca Kaufmann Makayleigh Poliquin, MD     TAKE these medications   aspirin 325 MG EC tablet Take 1 tablet (325 mg total) by mouth 2 (two) times daily after a meal.   cetirizine 10 MG tablet Commonly known as: ZYRTEC Take 1 tablet (10 mg total) by mouth daily.   cholecalciferol 1000 units tablet Commonly known as: VITAMIN D Take 1,000 Units by mouth daily.   clotrimazole-betamethasone cream Commonly known as: Lotrisone Apply 1 application topically 2 (two) times daily.   omeprazole 20 MG capsule Commonly known as: PRILOSEC Take 20 mg by mouth daily.   vitamin B-12 1000 MCG tablet Commonly known as: CYANOCOBALAMIN Take 1,000 mcg by mouth daily.        Objective:   BP 119/73   Pulse 65   Ht 5' 6"  (1.676 m)   Wt 253 lb (114.8 kg)   SpO2 95%   BMI 40.84 kg/m   Wt Readings from Last 3 Encounters:  11/17/20 253 lb (114.8 kg)  07/22/20 246 lb (111.6 kg)  07/18/20 246 lb (111.6 kg)    Physical Exam Vitals and nursing note reviewed.  Constitutional:      General: He is not in acute distress.    Appearance: He is well-developed. He is not diaphoretic.  Eyes:     General: No scleral icterus.  Conjunctiva/sclera: Conjunctivae normal.  Neck:     Thyroid: No thyromegaly.  Neurological:     Mental Status: He is alert and oriented to person, place, and time.     Sensory: Sensory deficit (Patient describes numbness and lack of sensation in both of his hands and all of the fingers and in the tops of his feet.,  Also on the bottoms of his feet as well.) present.  Psychiatric:        Behavior: Behavior normal.       Assessment & Plan:   Problem List Items Addressed This Visit   None   Visit Diagnoses    Numbness and tingling in both hands    -  Primary   Relevant Orders   Vitamin B12   TSH   CBC with Differential/Platelet   CMP14+EGFR   Bayer DCA Hb A1c Waived   Vaccine for streptococcus pneumoniae and influenza       Relevant Orders   Pneumococcal polysaccharide vaccine  23-valent greater than or equal to 2yo subcutaneous/IM (Completed)   Numbness and tingling of both feet       Relevant Orders   Vitamin B12   TSH   CBC with Differential/Platelet   CMP14+EGFR   Bayer DCA Hb A1c Waived      Will check blood work levels including B12 and thyroid and chemistry panel and blood sugar to make sure things are doing okay, if not able to find any answers from any of these then may send to neurology in the future. Follow up plan: Return if symptoms worsen or fail to improve.  Counseling provided for all of the vaccine components Orders Placed This Encounter  Procedures  . Pneumococcal polysaccharide vaccine 23-valent greater than or equal to 2yo subcutaneous/IM  . Vitamin B12  . TSH  . CBC with Differential/Platelet  . CMP14+EGFR  . Bayer [facility] Hb A1c [place], MD [facility] 11/17/2020, 12:47 PM

## 2020-11-18 LAB — CBC WITH DIFFERENTIAL/PLATELET
Basophils Absolute: 0 10*3/uL (ref 0.0–0.2)
Basos: 0 %
EOS (ABSOLUTE): 0.1 10*3/uL (ref 0.0–0.4)
Eos: 1 %
Hematocrit: 36.3 % — ABNORMAL LOW (ref 37.5–51.0)
Hemoglobin: 12 g/dL — ABNORMAL LOW (ref 13.0–17.7)
Immature Grans (Abs): 0 10*3/uL (ref 0.0–0.1)
Immature Granulocytes: 0 %
Lymphocytes Absolute: 2.7 10*3/uL (ref 0.7–3.1)
Lymphs: 35 %
MCH: 31.7 pg (ref 26.6–33.0)
MCHC: 33.1 g/dL (ref 31.5–35.7)
MCV: 96 fL (ref 79–97)
Monocytes Absolute: 0.5 10*3/uL (ref 0.1–0.9)
Monocytes: 7 %
Neutrophils Absolute: 4.3 10*3/uL (ref 1.4–7.0)
Neutrophils: 57 %
Platelets: 209 10*3/uL (ref 150–450)
RBC: 3.78 x10E6/uL — ABNORMAL LOW (ref 4.14–5.80)
RDW: 13.3 % (ref 11.6–15.4)
WBC: 7.6 10*3/uL (ref 3.4–10.8)

## 2020-11-18 LAB — CMP14+EGFR
ALT: 14 IU/L (ref 0–44)
AST: 16 IU/L (ref 0–40)
Albumin/Globulin Ratio: 0.8 — ABNORMAL LOW (ref 1.2–2.2)
Albumin: 3.7 g/dL (ref 3.7–4.7)
Alkaline Phosphatase: 70 IU/L (ref 44–121)
BUN/Creatinine Ratio: 13 (ref 10–24)
BUN: 12 mg/dL (ref 8–27)
Bilirubin Total: 0.5 mg/dL (ref 0.0–1.2)
CO2: 23 mmol/L (ref 20–29)
Calcium: 9.8 mg/dL (ref 8.6–10.2)
Chloride: 98 mmol/L (ref 96–106)
Creatinine, Ser: 0.92 mg/dL (ref 0.76–1.27)
Globulin, Total: 4.6 g/dL — ABNORMAL HIGH (ref 1.5–4.5)
Glucose: 101 mg/dL — ABNORMAL HIGH (ref 65–99)
Potassium: 3.9 mmol/L (ref 3.5–5.2)
Sodium: 137 mmol/L (ref 134–144)
Total Protein: 8.3 g/dL (ref 6.0–8.5)
eGFR: 88 mL/min/{1.73_m2} (ref >59)

## 2020-11-18 LAB — VITAMIN B12: Vitamin B-12: >2000 pg/mL — ABNORMAL HIGH (ref 232–1245)

## 2020-11-18 LAB — TSH: TSH: 1.05 u[IU]/mL (ref 0.450–4.500)

## 2020-11-21 ENCOUNTER — Other Ambulatory Visit: Payer: Self-pay | Admitting: Family Medicine

## 2020-11-21 DIAGNOSIS — R202 Paresthesia of skin: Secondary | ICD-10-CM

## 2020-11-21 DIAGNOSIS — R200 Anesthesia of skin: Secondary | ICD-10-CM

## 2020-11-21 NOTE — Progress Notes (Unsigned)
Placed referral to neurology for the patient

## 2020-11-23 DIAGNOSIS — M1712 Unilateral primary osteoarthritis, left knee: Secondary | ICD-10-CM | POA: Diagnosis not present

## 2020-11-23 DIAGNOSIS — Z96652 Presence of left artificial knee joint: Secondary | ICD-10-CM | POA: Diagnosis not present

## 2020-11-24 ENCOUNTER — Encounter: Payer: Self-pay | Admitting: Neurology

## 2020-12-19 ENCOUNTER — Ambulatory Visit (INDEPENDENT_AMBULATORY_CARE_PROVIDER_SITE_OTHER): Payer: Medicare HMO

## 2020-12-19 VITALS — Ht 66.0 in | Wt 253.0 lb

## 2020-12-19 DIAGNOSIS — Z0000 Encounter for general adult medical examination without abnormal findings: Secondary | ICD-10-CM | POA: Diagnosis not present

## 2020-12-19 NOTE — Progress Notes (Signed)
Subjective:   Derek [name] is a 75 y.o. male who presents for Medicare Annual/Subsequent preventive examination.  Virtual Visit via Telephone Note  I connected with  Derek Milourtis Krabill on 12/19/20 at  1:15 PM EDT by telephone and verified that I am speaking with the correct person using two identifiers.  Location: Patient: Home Provider: [facility] Persons participating in the virtual visit: patient/Nurse Health Advisor   I discussed the limitations, risks, security and privacy concerns of performing an evaluation and management service by telephone and the availability of in person appointments. The patient expressed understanding and agreed to proceed.  Interactive audio and video telecommunications were attempted between this nurse and patient, however failed, due to patient having technical difficulties OR patient did not have access to video capability.  We continued and completed visit with audio only.  Some vital signs may be absent or patient reported.   Doniven Vanpatten E Zailee Vallely, LPN    Review of Systems     Cardiac Risk Factors include: advanced age (>4555men, 21>65 women);obesity (BMI >30kg/m2);male gender;dyslipidemia     Objective:    Today's Vitals   12/19/20 1314  Weight: 253 lb (114.8 kg)  Height: 5\' 6"  (1.676 m)   Body mass index is 40.84 kg/m.  Advanced Directives 12/19/2020 08/05/2020 07/22/2020 07/18/2020 12/19/2019 12/12/2018 11/15/2015  Does Patient Have a Medical Advance Directive? No No No No Yes No No  Type of Advance Directive - - - - Living will - -  Does patient want to make changes to medical advance directive? - - - - No - Patient declined - -  Would patient like information on creating a medical advance directive? Yes (MAU/Ambulatory/Procedural Areas - Information given) No - Patient declined No - Patient declined No - Patient declined - Yes (MAU/Ambulatory/Procedural Areas - Information given) No - patient declined information    Current Medications  (verified) Outpatient Encounter Medications as of 12/19/2020  Medication Sig  . aspirin EC 325 MG EC tablet Take 1 tablet (325 mg total) by mouth 2 (two) times daily after a meal.  . cetirizine (ZYRTEC) 10 MG tablet Take 1 tablet (10 mg total) by mouth daily.  . cholecalciferol (VITAMIN D) 1000 UNITS tablet Take 1,000 Units by mouth daily.  . clotrimazole-betamethasone (LOTRISONE) cream Apply 1 application topically 2 (two) times daily.  Marland Kitchen. omeprazole (PRILOSEC) 20 MG capsule Take 20 mg by mouth daily.  . vitamin B-12 (CYANOCOBALAMIN) 1000 MCG tablet Take 1,000 mcg by mouth daily.   No facility-administered encounter medications on file as of 12/19/2020.    Allergies (verified) Patient has no known allergies.   History: Past Medical History:  Diagnosis Date  . Arthritis    left knee  . GERD (gastroesophageal reflux disease)   . Hypercholesterolemia   . Obesity   . Urethritis    when he was in [employer]   Past Surgical History:  Procedure Laterality Date  . COLONOSCOPY    . TOTAL KNEE ARTHROPLASTY Left 07/22/2020  . TOTAL KNEE ARTHROPLASTY Left 07/22/2020   Procedure: LEFT TOTAL KNEE ARTHROPLASTY;  Surgeon: Kathryne HitchBlackman, Christopher Y, MD;  Location: [facility] OR;  Service: Orthopedics;  Laterality: Left;   Family History  Problem Relation Age of Onset  . Cancer Mother        breast  . Diabetes Father    Social History   Socioeconomic History  . Marital status: Legally Separated    Spouse name: Not on file  . Number of children: 2  . Years of education: graduated high  school  . Highest education level: High school graduate  Occupational History  . Occupation: retired  Tobacco Use  . Smoking status: Never Smoker  . Smokeless tobacco: Never Used  Vaping Use  . Vaping Use: Never used  Substance and Sexual Activity  . Alcohol use: No    Alcohol/week: 0.0 standard drinks  . Drug use: No  . Sexual activity: Yes    Birth control/protection: Condom  Other Topics Concern  . 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 Difficulty of Paying Living Expenses: Somewhat hard  Food Insecurity: No Food Insecurity  . Worried About Programme researcher, broadcasting/film/video in the Last Year: Never true  . Ran Out of Food in the Last Year: Never true  Transportation Needs: No Transportation Needs  . Lack of Transportation (Medical): No  . Lack of Transportation (Non-Medical): No  Physical Activity: Insufficiently Active  . Days of Exercise per Week: 7 days  . Minutes of Exercise per Session: 20 min  Stress: No Stress Concern Present  . Feeling of Stress : Not at all  Social Connections: Moderately Integrated  . Frequency of Communication with Friends and Family: More than three times a week  . Frequency of Social Gatherings with Friends and Family: More than three times a week  . Attends Religious Services: More than 4 times per year  . Active Member of Clubs or Organizations: Yes  . Attends Banker Meetings: More than 4 times per year  . Marital Status: Separated    Tobacco Counseling Counseling given: Not Answered   Clinical Intake:  Pre-visit preparation completed: Yes  Pain : No/denies pain     BMI - recorded: 40.84 Nutritional Status: BMI > 30  Obese Nutritional Risks: None Diabetes: No     Diabetic? no         Activities of Daily Living In your present state of health, do you have any difficulty performing the following activities: 12/19/2020 07/22/2020  Hearing? Y -  Vision? N -  Difficulty concentrating or making decisions? N -  Walking or climbing stairs? Y -  Dressing or bathing? N -  Doing errands, shopping? N Y  Quarry manager and eating ? N -  Using the Toilet? N -  In the past six months, have you accidently leaked urine? N -  Do you have problems with loss of bowel control? N -  Managing  your Medications? N -  Managing your Finances? N -  Housekeeping or managing your Housekeeping? N -  Some recent data might be hidden    Patient Care Team: Dettinger, Elige Radon, MD as PCP - General (Family Medicine)  Indicate any recent Medical Services you may have received from other than [facility] providers in the past year (date may be approximate).     Assessment:   This is a routine wellness examination for [facility].  Hearing/Vision screen  Hearing Screening   125Hz  250Hz  500Hz  1000Hz  2000Hz  3000Hz  4000Hz  6000Hz  8000Hz   Right ear:           Left ear:           Comments: Has hearing aids, but doesn't usually wear them - gets hearing aids from clinic  Vision Screening Comments: Denies vision difficulties [facility]  Dietary issues and exercise  activities discussed: Current Exercise Habits: Home exercise routine, Type of exercise: stretching;walking, Time (Minutes): 20, Frequency (Times/Week): 7, Weekly Exercise (Minutes/Week): 140, Intensity: Mild, Exercise limited by: orthopedic condition(s)  Goals Addressed            This Visit's Progress   . Exercise 3x per week (30 min per time)   On track   . 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 He would like to spend more time doing things he enjoys such as fishing. Goals Addressed             This Visit's Progress   . Exercise 3x per week (30 min per time)   On track   . Prevent falls   On track          . Prevent falls   On track     Depression Screen PHQ 2/9 Scores 12/19/2020 11/17/2020 06/03/2020 12/19/2019 12/12/2018 10/10/2018 05/29/2018  PHQ - 2 Score 0 0 0 0 0 0 0    Fall Risk Fall Risk  12/19/2020 11/17/2020 06/03/2020 03/03/2020 12/19/2019  Falls in the past year? 0 0 0 1 1  Number falls in past yr: 0 - - 1 1  Injury with Fall? 0 - - 1 0  Risk for fall due to : Impaired  balance/gait;Orthopedic patient - - Impaired mobility;Impaired balance/gait Orthopedic patient;Impaired mobility;Impaired balance/gait  Follow up Falls prevention discussed - Falls evaluation completed Falls evaluation completed Falls evaluation completed    FALL RISK PREVENTION PERTAINING TO THE HOME:  Any stairs in or around the home? No  If so, are there any without handrails? No  Home free of loose throw rugs in walkways, pet beds, electrical cords, etc? Yes  Adequate lighting in your home to reduce risk of falls? Yes   ASSISTIVE DEVICES UTILIZED TO PREVENT FALLS:  Life alert? No  Use of a cane, walker or w/c? Yes  Grab bars in the bathroom? Yes  Shower chair or bench in shower? Yes  Elevated toilet seat or a handicapped toilet? Yes   TIMED UP AND GO:  Was the test performed? No .  Telephonic visit.  Cognitive Function: Normal cognitive status assessed by direct observation by this Nurse Health Advisor. No abnormalities found.      6CIT Screen 12/19/2019 12/12/2018  What Year? 0 points 0 points  What month? 0 points 0 points  What time? 3 points 0 points  Count back from 20 0 points 0 points  Months in reverse 4 points 4 points  Repeat phrase 2 points 0 points  Total Score 9 4    Immunizations Immunization History  Administered Date(s) Administered  . Fluad Quad(high Dose 65+) 05/25/2019  . Influenza,inj,Quad PF,6+ Mos 06/25/2014, 06/15/2016  . Influenza-Unspecified 06/16/2013, 05/25/2020  . Moderna Sars-Covid-2 Vaccination 09/20/2019, 10/18/2019, 04/25/2020  . Pneumococcal Conjugate-13 05/25/2019  . Pneumococcal Polysaccharide-23 11/17/2020  . Tdap 09/29/2011    TDAP status: Up to date  Flu Vaccine status: Up to date  Pneumococcal vaccine status: Up to date  Covid-19 vaccine status: Completed vaccines  Qualifies for Shingles Vaccine? Yes   Zostavax completed No   Shingrix Completed?: No.    Education has been provided regarding the importance of this  vaccine. Patient has been advised to call insurance company to determine out of pocket expense if they have not yet received this vaccine. Advised may  also receive vaccine at local pharmacy or Health Dept. Verbalized acceptance and understanding.  Screening Tests Health Maintenance  Topic Date Due  . INFLUENZA VACCINE  03/16/2021  . TETANUS/TDAP  09/28/2021  . COLONOSCOPY (Pts 45-23yrs Insurance coverage will need to be confirmed)  02/28/2022  . COVID-19 Vaccine  Completed  . Hepatitis C Screening  Completed  . PNA vac Low Risk Adult  Completed  . HPV VACCINES  Aged Out    Health Maintenance  There are no preventive care reminders to display for this patient.  Colorectal cancer screening: Type of screening: Colonoscopy. Completed 02/29/2012. Repeat every 10 years  Lung Cancer Screening: (Low Dose CT Chest recommended if Age 38-80 years, 30 pack-year currently smoking OR have quit w/in 15years.) does not qualify.   Additional Screening:  Hepatitis C Screening: does qualify; Completed 06/15/2016  Vision Screening: Recommended annual ophthalmology exams for early detection of glaucoma and other disorders of the eye. Is the patient up to date with their annual eye exam?  Yes  Who is the provider or what is the name of the office in which the patient attends [facility] exams[facility] If pt is not established with a provider, would they like to be referred to a provider to establish care? No .   Dental Screening: Recommended annual dental exams for proper oral hygiene  Community Resource Referral / Chronic Care Management: CRR required this visit?  No   CCM required this visit?  No      Plan:     I have personally reviewed and noted the following in the patient's chart:   . Medical and social history . Use of alcohol, tobacco or illicit drugs  . Current medications and supplements including opioid prescriptions. Patient is not currently taking opioid  prescriptions. . Functional ability and status . Nutritional status . Physical activity . Advanced directives . List of other physicians . Hospitalizations, surgeries, and ER visits in previous 12 months . Vitals . Screenings to include cognitive, depression, and falls . Referrals and appointments  In addition, I have reviewed and discussed with patient certain preventive protocols, quality metrics, and best practice recommendations. A written personalized care plan for preventive services as well as general preventive health recommendations were provided to patient.     [place] Constable, LPN   10/17/3543   Nurse Notes: None

## 2020-12-19 NOTE — Patient Instructions (Addendum)
[name] , Thank you for taking time to come for your Medicare Wellness Visit. I appreciate your ongoing commitment to your health goals. Please review the following plan we discussed and let me know if I can assist you in the future.   Screening recommendations/referrals: Colonoscopy: Done 02/29/2012 - Repeat 10 years Recommended yearly ophthalmology/optometry visit for glaucoma screening and checkup Recommended yearly dental visit for hygiene and checkup  Vaccinations: Influenza vaccine: Done 05/25/2020 Pneumococcal vaccine: Done 05/25/2019 & 11/17/2020 Tdap vaccine: Done 09/29/2011 - Repeat 10 years Shingles vaccine: Shingrix discussed. Please contact your pharmacy or the [place] for coverage information.    Covid-19: Done 09/20/19, 10/18/19, & 05/25/20  Advanced directives: Please bring a copy of your health care power of attorney and living will to the office to be added to your chart at your convenience.  Conditions/risks identified: Aim for 30 minutes of exercise or brisk walking each day, drink 6-8 glasses of water and eat lots of fruits and vegetables.  Next appointment: Follow up in one year for your annual wellness visit.   Preventive Care 73 Years and Older, Male  Preventive care refers to lifestyle choices and visits with your health care provider that can promote health and wellness. What does preventive care include?  A yearly physical exam. This is also called an annual well check.  Dental exams once or twice a year.  Routine eye exams. Ask your health care provider how often you should have your eyes checked.  Personal lifestyle choices, including:  Daily care of your teeth and gums.  Regular physical activity.  Eating a healthy diet.  Avoiding tobacco and drug use.  Limiting alcohol use.  Practicing safe sex.  Taking low doses of aspirin every day.  Taking vitamin and mineral supplements as recommended by your health care provider. What happens during an annual  well check? The services and screenings done by your health care provider during your annual well check will depend on your age, overall health, lifestyle risk factors, and family history of disease. Counseling  Your health care provider may ask you questions about your:  Alcohol use.  Tobacco use.  Drug use.  Emotional well-being.  Home and relationship well-being.  Sexual activity.  Eating habits.  History of falls.  Memory and ability to understand (cognition).  Work and work Astronomer. Screening  You may have the following tests or measurements:  Height, weight, and BMI.  Blood pressure.  Lipid and cholesterol levels. These may be checked every 5 years, or more frequently if you are over 41 years old.  Skin check.  Lung cancer screening. You may have this screening every year starting at age 38 if you have a 30-pack-year history of smoking and currently smoke or have quit within the past 15 years.  Fecal occult blood test (FOBT) of the stool. You may have this test every year starting at age 64.  Flexible sigmoidoscopy or colonoscopy. You may have a sigmoidoscopy every 5 years or a colonoscopy every 10 years starting at age 61.  Prostate cancer screening. Recommendations will vary depending on your family history and other risks.  Hepatitis C blood test.  Hepatitis B blood test.  Sexually transmitted disease (STD) testing.  Diabetes screening. This is done by checking your blood sugar (glucose) after you have not eaten for a while (fasting). You may have this done every 1-3 years.  Abdominal aortic aneurysm (AAA) screening. You may need this if you are a current or former smoker.  Osteoporosis.  You may be screened starting at age 37 if you are at high risk. Talk with your health care provider about your test results, treatment options, and if necessary, the need for more tests. Vaccines  Your health care provider may recommend certain vaccines, such  as:  Influenza vaccine. This is recommended every year.  Tetanus, diphtheria, and acellular pertussis (Tdap, Td) vaccine. You may need a Td booster every 10 years.  Zoster vaccine. You may need this after age 93.  Pneumococcal 13-valent conjugate (PCV13) vaccine. One dose is recommended after age 18.  Pneumococcal polysaccharide (PPSV23) vaccine. One dose is recommended after age 95. Talk to your health care provider about which screenings and vaccines you need and how often you need them. This information is not intended to replace advice given to you by your health care provider. Make sure you discuss any questions you have with your health care provider. Document Released: 08/29/2015 Document Revised: 04/21/2016 Document Reviewed: 06/03/2015 Elsevier Interactive Patient Education  2017 [place] Prevention in the Home Falls can cause injuries. They can happen to people of all ages. There are many things you can do to make your home safe and to help prevent falls. What can I do on the outside of my home?  Regularly fix the edges of walkways and driveways and fix any cracks.  Remove anything that might make you trip as you walk through a door, such as a raised step or threshold.  Trim any bushes or trees on the path to your home.  Use bright outdoor lighting.  Clear any walking paths of anything that might make someone trip, such as rocks or tools.  Regularly check to see if handrails are loose or broken. Make sure that both sides of any steps have handrails.  Any raised decks and porches should have guardrails on the edges.  Have any leaves, snow, or ice cleared regularly.  Use sand or salt on walking paths during winter.  Clean up any spills in your garage right away. This includes oil or grease spills. What can I do in the bathroom?  Use night lights.  Install grab bars by the toilet and in the tub and shower. Do not use towel bars as grab bars.  Use  non-skid mats or decals in the tub or shower.  If you need to sit down in the shower, use a plastic, non-slip stool.  Keep the floor dry. Clean up any water that spills on the floor as soon as it happens.  Remove soap buildup in the tub or shower regularly.  Attach bath mats securely with double-sided non-slip rug tape.  Do not have throw rugs and other things on the floor that can make you trip. What can I do in the bedroom?  Use night lights.  Make sure that you have a light by your bed that is easy to reach.  Do not use any sheets or blankets that are too big for your bed. They should not hang down onto the floor.  Have a firm chair that has side arms. You can use this for support while you get dressed.  Do not have throw rugs and other things on the floor that can make you trip. What can I do in the kitchen?  Clean up any spills right away.  Avoid walking on wet floors.  Keep items that you use a lot in easy-to-reach places.  If you need to reach something above you, use a strong step stool  that has a grab bar.  Keep electrical cords out of the way.  Do not use floor polish or wax that makes floors slippery. If you must use wax, use non-skid floor wax.  Do not have throw rugs and other things on the floor that can make you trip. What can I do with my stairs?  Do not leave any items on the stairs.  Make sure that there are handrails on both sides of the stairs and use them. Fix handrails that are broken or loose. Make sure that handrails are as long as the stairways.  Check any carpeting to make sure that it is firmly attached to the stairs. Fix any carpet that is loose or worn.  Avoid having throw rugs at the top or bottom of the stairs. If you do have throw rugs, attach them to the floor with carpet tape.  Make sure that you have a light switch at the top of the stairs and the bottom of the stairs. If you do not have them, ask someone to add them for you. What  else can I do to help prevent falls?  Wear shoes that:  Do not have high heels.  Have rubber bottoms.  Are comfortable and fit you well.  Are closed at the toe. Do not wear sandals.  If you use a stepladder:  Make sure that it is fully opened. Do not climb a closed stepladder.  Make sure that both sides of the stepladder are locked into place.  Ask someone to hold it for you, if possible.  Clearly mark and make sure that you can see:  Any grab bars or handrails.  First and last steps.  Where the edge of each step is.  Use tools that help you move around (mobility aids) if they are needed. These include:  Canes.  Walkers.  Scooters.  Crutches.  Turn on the lights when you go into a dark area. Replace any light bulbs as soon as they burn out.  Set up your furniture so you have a clear path. Avoid moving your furniture around.  If any of your floors are uneven, fix them.  If there are any pets around you, be aware of where they are.  Review your medicines with your doctor. Some medicines can make you feel dizzy. This can increase your chance of falling. Ask your doctor what other things that you can do to help prevent falls. This information is not intended to replace advice given to you by your health care provider. Make sure you discuss any questions you have with your health care provider. Document Released: 05/29/2009 Document Revised: 01/08/2016 Document Reviewed: 09/06/2014 Elsevier Interactive Patient Education  2017 [employer].

## 2020-12-23 DIAGNOSIS — M1712 Unilateral primary osteoarthritis, left knee: Secondary | ICD-10-CM | POA: Diagnosis not present

## 2020-12-23 DIAGNOSIS — Z96652 Presence of left artificial knee joint: Secondary | ICD-10-CM | POA: Diagnosis not present

## 2021-01-22 DIAGNOSIS — E119 Type 2 diabetes mellitus without complications: Secondary | ICD-10-CM | POA: Diagnosis not present

## 2021-01-22 LAB — HM DIABETES EYE EXAM

## 2021-01-23 DIAGNOSIS — M1712 Unilateral primary osteoarthritis, left knee: Secondary | ICD-10-CM | POA: Diagnosis not present

## 2021-01-23 DIAGNOSIS — Z96652 Presence of left artificial knee joint: Secondary | ICD-10-CM | POA: Diagnosis not present

## 2021-02-20 ENCOUNTER — Ambulatory Visit: Payer: Medicare HMO | Admitting: Neurology

## 2021-02-22 DIAGNOSIS — M1712 Unilateral primary osteoarthritis, left knee: Secondary | ICD-10-CM | POA: Diagnosis not present

## 2021-02-22 DIAGNOSIS — Z96652 Presence of left artificial knee joint: Secondary | ICD-10-CM | POA: Diagnosis not present

## 2021-03-11 ENCOUNTER — Ambulatory Visit (INDEPENDENT_AMBULATORY_CARE_PROVIDER_SITE_OTHER): Payer: Medicare HMO

## 2021-03-11 ENCOUNTER — Encounter: Payer: Self-pay | Admitting: Orthopaedic Surgery

## 2021-03-11 ENCOUNTER — Ambulatory Visit (INDEPENDENT_AMBULATORY_CARE_PROVIDER_SITE_OTHER): Payer: Medicare HMO | Admitting: Orthopaedic Surgery

## 2021-03-11 DIAGNOSIS — Z96652 Presence of left artificial knee joint: Secondary | ICD-10-CM

## 2021-03-11 NOTE — Progress Notes (Signed)
The patient comes in today at 7 months status post a left total knee replacement.  He said the knee is doing well and has no issues.  However he has no energy and apparently he is being worked up for some type of blood disorder he states.  I am not sure what that may be because there is not complete notes.  He has no energy and is constantly fatigued.  He has no complaints as it relates to his left total knee replacement.  We replaced his knee in December of last year due to severe end-stage arthritis.  Examination of his left knee shows no swelling.  His range of motion is full and the knee feels ligamentously stable.  2 views of the left knee show well-seated total knee arthroplasty with no complicating features.  At this point, follow-up for his knee can be as needed.  I wish him the best in terms of the work-up he is having for what ever is going on with him in terms of his fatigue.  All questions and concerns were answered and addressed.

## 2021-03-25 DIAGNOSIS — Z96652 Presence of left artificial knee joint: Secondary | ICD-10-CM | POA: Diagnosis not present

## 2021-03-25 DIAGNOSIS — M1712 Unilateral primary osteoarthritis, left knee: Secondary | ICD-10-CM | POA: Diagnosis not present

## 2021-03-27 ENCOUNTER — Ambulatory Visit: Payer: Medicare HMO | Admitting: Neurology

## 2021-03-27 ENCOUNTER — Other Ambulatory Visit: Payer: Self-pay

## 2021-03-27 ENCOUNTER — Encounter: Payer: Self-pay | Admitting: Neurology

## 2021-03-27 VITALS — BP 148/88 | HR 99 | Ht 66.0 in | Wt 233.8 lb

## 2021-03-27 DIAGNOSIS — M48062 Spinal stenosis, lumbar region with neurogenic claudication: Secondary | ICD-10-CM | POA: Diagnosis not present

## 2021-03-27 DIAGNOSIS — R292 Abnormal reflex: Secondary | ICD-10-CM

## 2021-03-27 DIAGNOSIS — M47812 Spondylosis without myelopathy or radiculopathy, cervical region: Secondary | ICD-10-CM | POA: Diagnosis not present

## 2021-03-27 DIAGNOSIS — R29898 Other symptoms and signs involving the musculoskeletal system: Secondary | ICD-10-CM

## 2021-03-27 DIAGNOSIS — M21371 Foot drop, right foot: Secondary | ICD-10-CM

## 2021-03-27 NOTE — Patient Instructions (Signed)
MRI cervical and lumbar spine without contrast will be ordered.  We will contact your with the results and let you know the next step.

## 2021-03-27 NOTE — Progress Notes (Signed)
[facility] Clinic Note - Initial Visit   Date: 03/27/21  Haskel Dewalt MRN: [id number] DOB: [birth year]   Dear Dr. Louanne Skye:  Thank you for your kind referral of Derek [name] for consultation of hand and feet numbness. Although his history is well known to you, please allow Derek [name] to reiterate it for the purpose of our medical record. The patient was accompanied to the clinic by friend who also provides collateral information.     History of Present Illness: Derek [name] is a 75 y.o. right-handed male with GERD presenting for evaluation of bilateral hand and feet numbness. Starting around 2019, he began having numbness in the hands. He has weakness in the hands and reports difficulty with opening jars and bottles. Numbness does not wake him up from sleeping.  No radicular arm pain. He also complains of numbness involving the toes which start in 2021.  He has leg weakness and has difficulty with standing up from a low chair. He denies cramps.  He has been using a walker for the past two years because of generalized weakness.  He lives alone and performs all ADLs and IADLs.  He has history of right foot weakness from injury to his right leg 40 years ago.   Out-side paper records, electronic medical record, and images have been reviewed where available and summarized as:  Lab Results  Component Value Date   HGBA1C 4.9 11/17/2020   Lab Results  Component Value Date   VITAMINB12 >2000 (H) 11/17/2020   Lab Results  Component Value Date   TSH 1.050 11/17/2020   No results found for: ESRSEDRATE, POCTSEDRATE  Past Medical History:  Diagnosis Date   Arthritis    left knee   GERD (gastroesophageal reflux disease)    Hypercholesterolemia    Obesity    Urethritis    when he was in [employer]    Past Surgical History:  Procedure Laterality Date   COLONOSCOPY     TOTAL KNEE ARTHROPLASTY Left 07/22/2020   TOTAL KNEE ARTHROPLASTY Left 07/22/2020   Procedure:  LEFT TOTAL KNEE ARTHROPLASTY;  Surgeon: Kathryne Hitch, MD;  Location: [facility] OR;  Service: Orthopedics;  Laterality: Left;     Medications:  Outpatient Encounter Medications as of 03/27/2021  Medication Sig   aspirin EC 325 MG EC tablet Take 1 tablet (325 mg total) by mouth 2 (two) times daily after a meal.   cetirizine (ZYRTEC) 10 MG tablet Take 1 tablet (10 mg total) by mouth daily.   cholecalciferol (VITAMIN D) 1000 UNITS tablet Take 1,000 Units by mouth daily.   omeprazole (PRILOSEC) 20 MG capsule Take 20 mg by mouth daily.   vitamin B-12 (CYANOCOBALAMIN) 1000 MCG tablet Take 1,000 mcg by mouth daily.   clotrimazole-betamethasone (LOTRISONE) cream Apply 1 application topically 2 (two) times daily.   No facility-administered encounter medications on file as of 03/27/2021.    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   Right handed    Vital Signs:  BP (!) 148/88   Pulse 99   Ht 5\' 6"  (1.676 m)   Wt 233 lb 12.8 oz (106.1 kg)   SpO2 98%   BMI 37.74 kg/m     Neurological Exam: MENTAL STATUS including orientation to time, place, person, recent and remote memory, attention span and concentration, language, and fund of knowledge is normal.  Speech is not dysarthric.  CRANIAL NERVES: II:  No visual field defects.   III-IV-VI: Pupils equal round and reactive to light.  Normal conjugate, extra-ocular eye movements in all directions of gaze.  No nystagmus.  No ptosis.   V:  Normal facial sensation.    VII:   Normal facial symmetry and movements.   VIII:  Normal hearing and vestibular function.   IX-X:  Normal palatal movement.   XI:  Normal shoulder shrug and head rotation.   XII:  Normal tongue strength and range of motion, no deviation or fasciculation.  MOTOR:  Mild intrinsic hand muscle atrophy bilaterally. Right lateral leg with old scar and soft tissue indentation from prior injury. No fasciculations or abnormal movements.  No pronator drift.   Upper Extremity:  Right  Left  Deltoid  5/5   5/5   Biceps  5/5   5/5   Triceps  5/5   5/5   Infraspinatus 5/5  5/5  Medial pectoralis 5/5  5/5  Wrist extensors  5/5   5/5   Wrist flexors  5/5   5/5   Finger extensors  5/5   5/5   Finger flexors  5/5   5/5   Dorsal interossei  4/+5   4+/5   Abductor pollicis  4+/5   4+/5   Tone (Ashworth scale)  0  0   Lower Extremity:  Right  Left  Hip flexors  5/5   5/5   Hip extensors  5/5   5/5   Adductor 5/5  5/5  Abductor 5/5  5/5  Knee flexors  5/5   5/5   Knee extensors  5/5   5/5   Dorsiflexors  3/5   5/5   Plantarflexors  5/5   5/5   Toe extensors  3/5   5/5   Toe flexors  5/5   5/5   Tone (Ashworth scale)  0  0   MSRs:  Right        Left                  brachioradialis 3+  3+  biceps 3+  3+  triceps 3+  3+  patellar 3+  3+  ankle jerk 2+  2+  Hoffman no  no  plantar response down  down   SENSORY:  Vibration intact at the toes and hands. Temperature and pin prick reduced over the hands and feet.   COORDINATION/GAIT: Normal finger-to- nose-finger.  Intact rapid alternating movements bilaterally.  Gait not tested, patient arrived in wheelchair   IMPRESSION: Bilateral hand and feet paresthesias with exam showing distal hand weakness, hyperreflexia, and right foot drop warrants evaluations for cervical and lumbosacral canal stenosis.  MRI cervical and lumbar spine will be ordered to look for compressive pathology.  Although carpal tunnel syndrome may also cause hand numbness and ABP  weakness, reflexes would be normal. Based on the results of his imaging, electrodiagnostic testing maybe considered.    Thank you for allowing me to participate in patient's care.  If I  can answer any additional questions, I would be pleased to do so.    Sincerely,    Keary Hanak K. Posey Pronto, DO

## 2021-04-09 ENCOUNTER — Other Ambulatory Visit: Payer: Self-pay | Admitting: Neurology

## 2021-04-09 DIAGNOSIS — Z77018 Contact with and (suspected) exposure to other hazardous metals: Secondary | ICD-10-CM

## 2021-04-21 ENCOUNTER — Ambulatory Visit
Admission: RE | Admit: 2021-04-21 | Discharge: 2021-04-21 | Disposition: A | Discharge status: Home / Self Care | Payer: Medicare HMO | Source: Ambulatory Visit | Attending: Neurology | Admitting: Neurology

## 2021-04-21 ENCOUNTER — Other Ambulatory Visit: Payer: Self-pay

## 2021-04-21 DIAGNOSIS — M48061 Spinal stenosis, lumbar region without neurogenic claudication: Secondary | ICD-10-CM | POA: Diagnosis not present

## 2021-04-21 DIAGNOSIS — M47812 Spondylosis without myelopathy or radiculopathy, cervical region: Secondary | ICD-10-CM

## 2021-04-21 DIAGNOSIS — M4802 Spinal stenosis, cervical region: Secondary | ICD-10-CM | POA: Diagnosis not present

## 2021-04-21 DIAGNOSIS — R292 Abnormal reflex: Secondary | ICD-10-CM

## 2021-04-21 DIAGNOSIS — Z77018 Contact with and (suspected) exposure to other hazardous metals: Secondary | ICD-10-CM

## 2021-04-21 DIAGNOSIS — M48062 Spinal stenosis, lumbar region with neurogenic claudication: Secondary | ICD-10-CM

## 2021-04-21 DIAGNOSIS — M21371 Foot drop, right foot: Secondary | ICD-10-CM

## 2021-04-21 IMAGING — MR MR LUMBAR SPINE W/O CM
4 of 5 series · 27 of 48 positions shown · non-contrast
Comparison: None.

CLINICAL DATA: hyperreflexia, Right foot drop

EXAM:
MRI LUMBAR SPINE WITHOUT CONTRAST
TECHNIQUE: Multiplanar, multisequence MR imaging of the lumbar spine was
performed. No intravenous contrast was administered.

[Series 3: T2 · sagittal · 4.0mm · 1.09mm/px · 6 of 17 slices shown (1 of 2)]
[im 1/17]
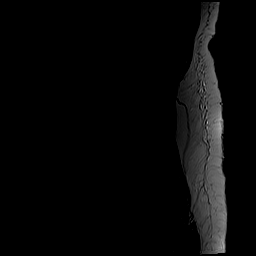
[im 4/17]
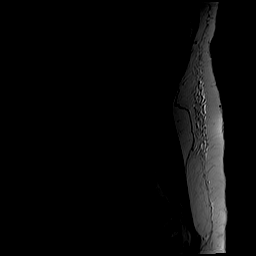
[im 7/17]
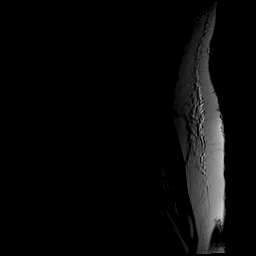
[im 10/17]
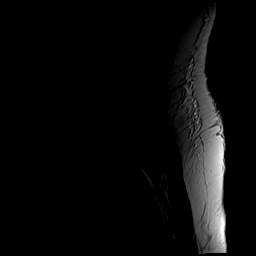
[im 13/17]
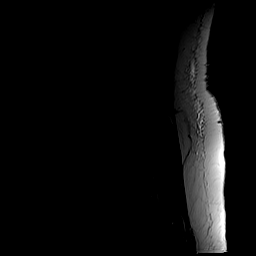
[im 17/17]
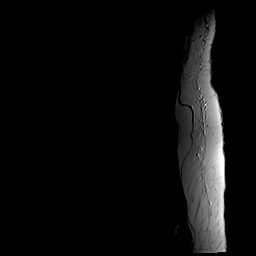

[Series 5: T1 · sagittal · 4.0mm · 1.09mm/px · 6 of 17 slices shown (1 of 2)]
[im 1/17]
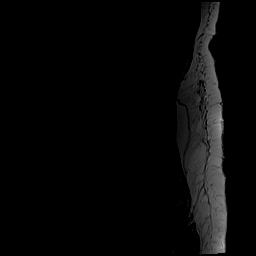
[im 4/17]
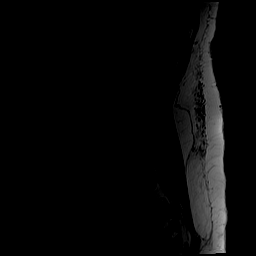
[im 7/17]
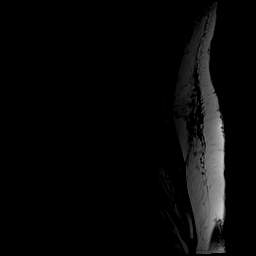
[im 10/17]
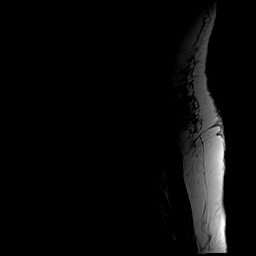
[im 13/17]
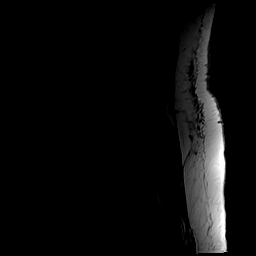
[im 17/17]
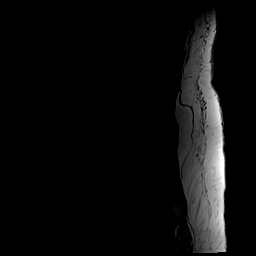

[Series 6: T2 · axial · 4.0mm · 0.39mm/px · z∈[-133,+90]mm · 9 of 42 slices shown (2 of 2)]
[im 1/42]
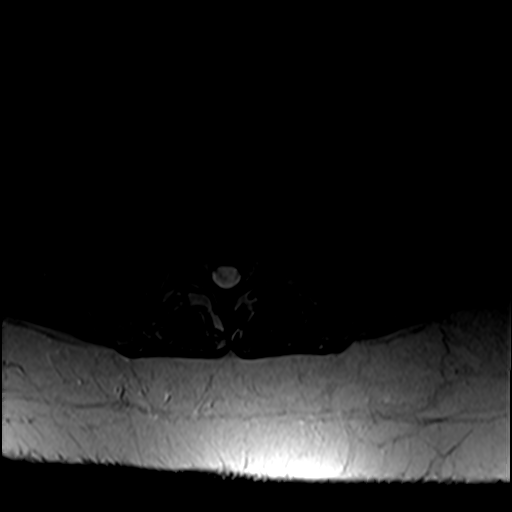
[im 6/42]
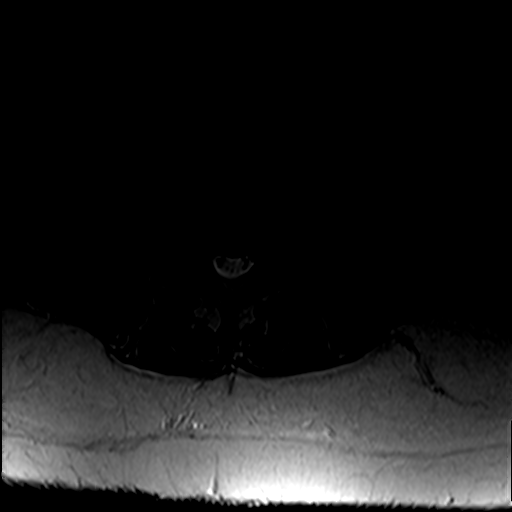
[im 12/42]
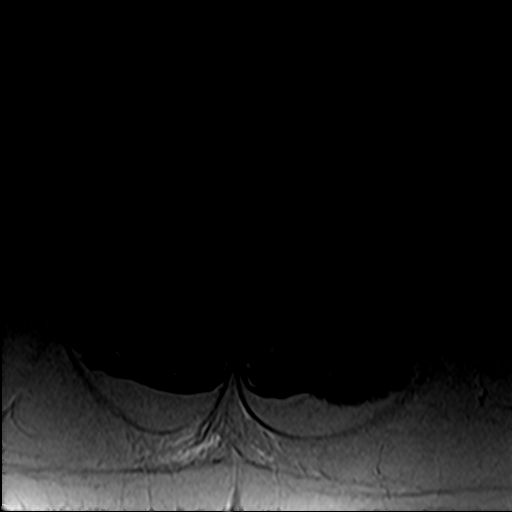
[im 18/42]
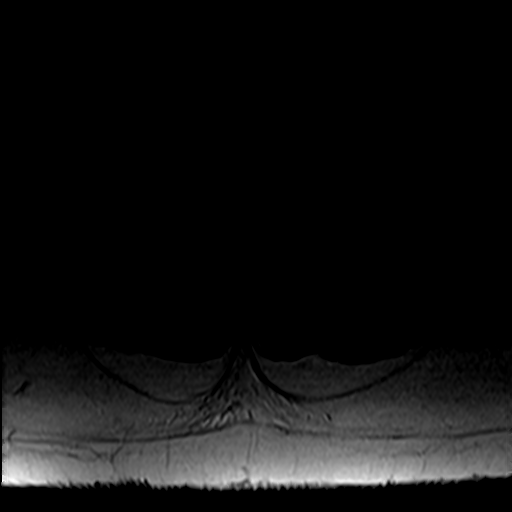
[im 21/42]
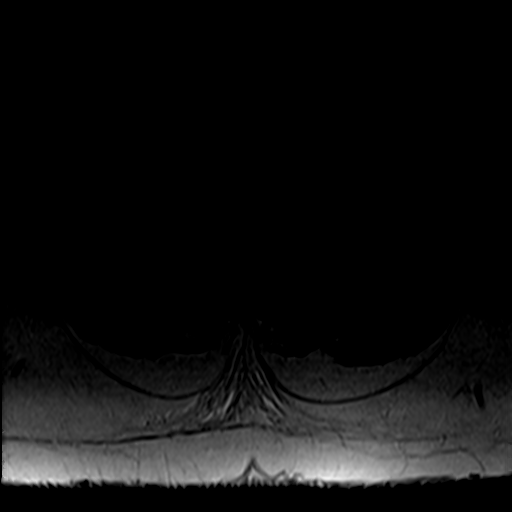
[im 24/42]
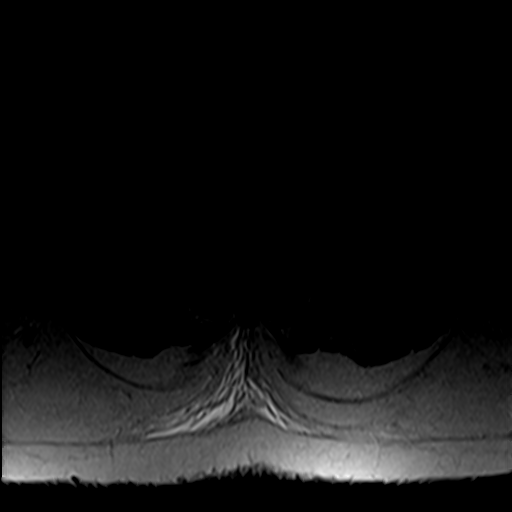
[im 30/42]
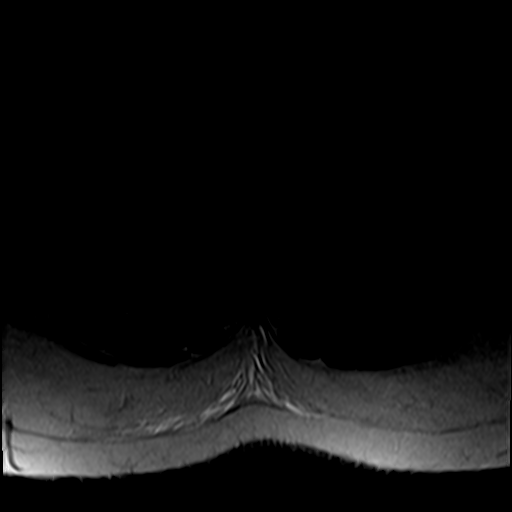
[im 36/42]
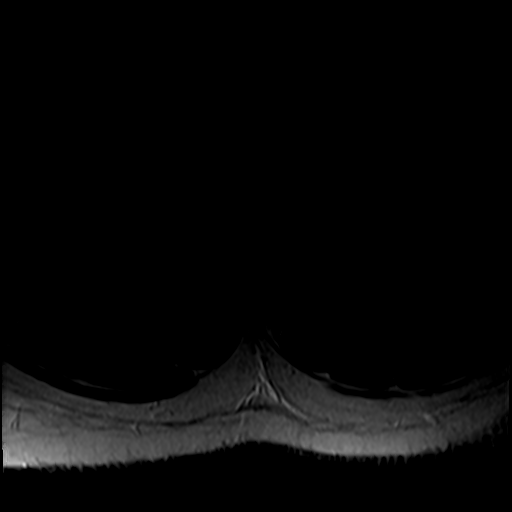
[im 42/42]
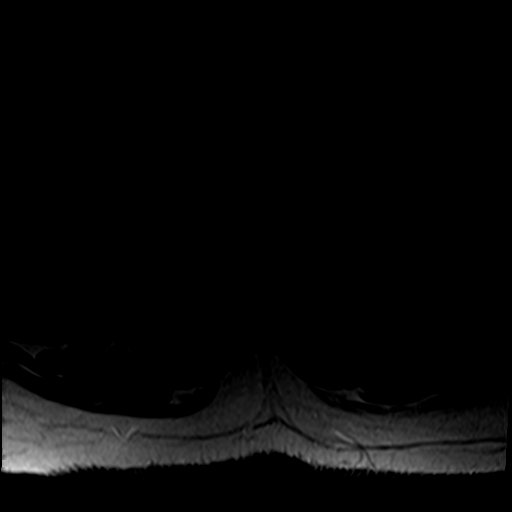

[Series 7: T1 · axial · 4.0mm · 0.39mm/px · z∈[-133,+61]mm · 6 of 42 slices shown (2 of 2)]
[im 1/42]
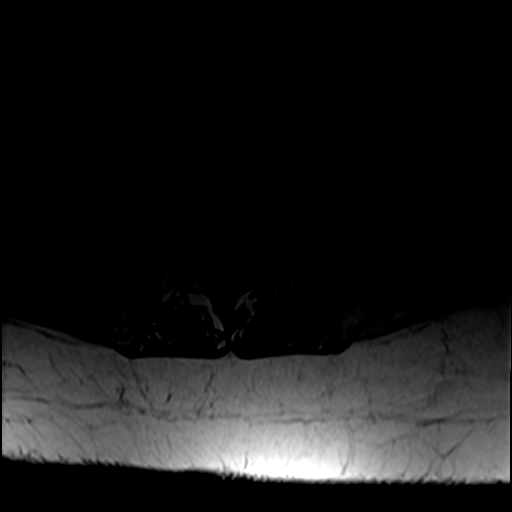
[im 6/42]
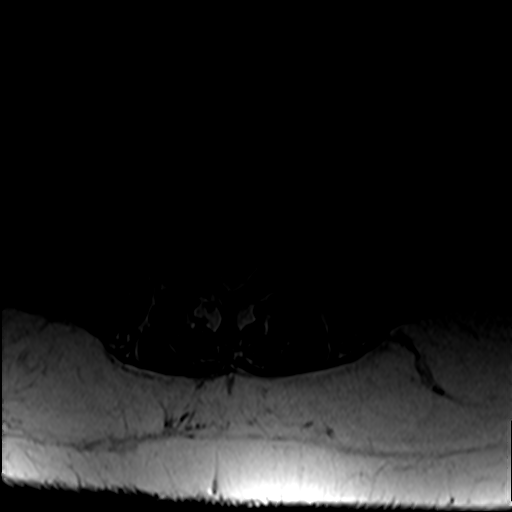
[im 12/42]
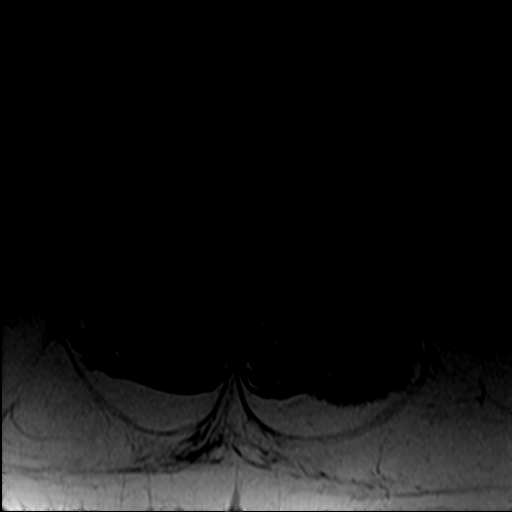
[im 18/42]
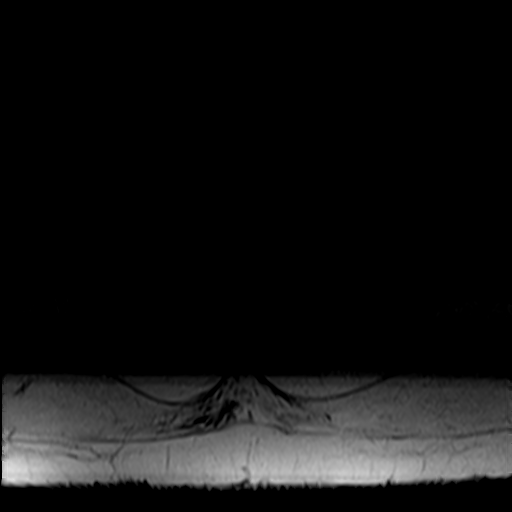
[im 21/42]
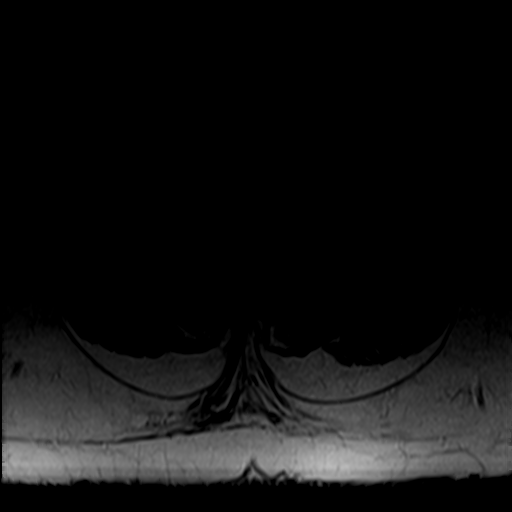
[im 36/42]
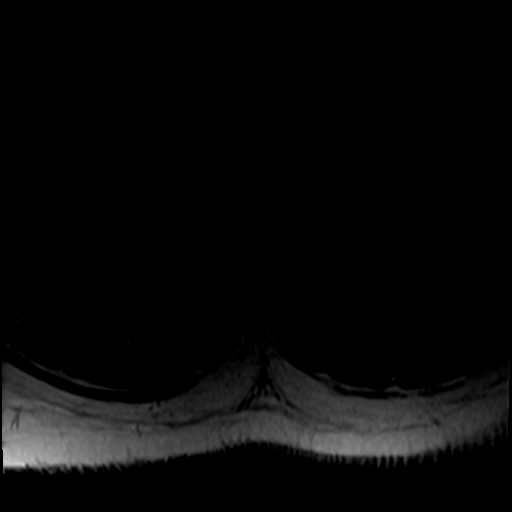

[27 of 48 positions shown; findings below may reference images not displayed]

FINDINGS: Segmentation: Presumed standard anatomy with the inferior-most well
developed disc space designated as L5-S1.

Alignment:  7 mm grade 1 anterolisthesis L4 on L5.

Vertebrae:  No fracture, evidence of discitis, or bone lesion.

Conus medullaris and cauda equina: Conus extends to the L1 level.
Conus and cauda equina appear normal.

Paraspinal and other soft tissues: Partially visualized right renal
sinus cysts.

Disc levels:

T12-L1: Mild annular disc bulge.  No foraminal or canal stenosis.

L1-L2: No significant disc protrusion, foraminal stenosis, or canal
stenosis.

L2-L3: Annular disc bulge with left paracentral disc protrusion.
Findings result in mild canal stenosis with mild bilateral foraminal
stenosis.

L3-L4: Circumferential disc bulge with focal central disc
protrusion. Mild bilateral facet hypertrophy. Mild-to-moderate canal
stenosis with mild-to-moderate left and mild right foraminal
stenosis.

L4-L5: Anterolisthesis with disc uncovering and diffuse disc bulge.
Advanced bilateral facet arthropathy and ligamentum flavum buckling.
Findings result in severe canal stenosis with mild-to-moderate
bilateral foraminal stenosis.

L5-S1: Diffuse disc bulge. Mild bilateral facet arthropathy. Is
impress upon the ventral thecal sac without canal stenosis. There is
moderate right foraminal stenosis.
IMPRESSION: 1. Multilevel lumbar spondylosis as described above. Findings are
most pronounced at L4-L5 where there is severe canal stenosis and
mild-to-moderate bilateral foraminal stenosis.
2. Mild-to-moderate canal stenosis at L3-L4 and mild canal stenosis
at L2-L3.
3. Moderate right foraminal stenosis at L5-S1.

## 2021-04-21 IMAGING — MR MR CERVICAL SPINE W/O CM
5 series · 35 of 48 positions shown · non-contrast
Comparison: CT cervical spine [DATE].

CLINICAL DATA: Spinal stenosis, C-spine

EXAM:
MRI CERVICAL SPINE WITHOUT CONTRAST
TECHNIQUE: Multiplanar, multisequence MR imaging of the cervical spine was
performed. No intravenous contrast was administered.

[Series 3: T2 · sagittal · 3.0mm · 0.41mm/px · 8 of 17 slices shown (1 of 2)]
[im 1/17]
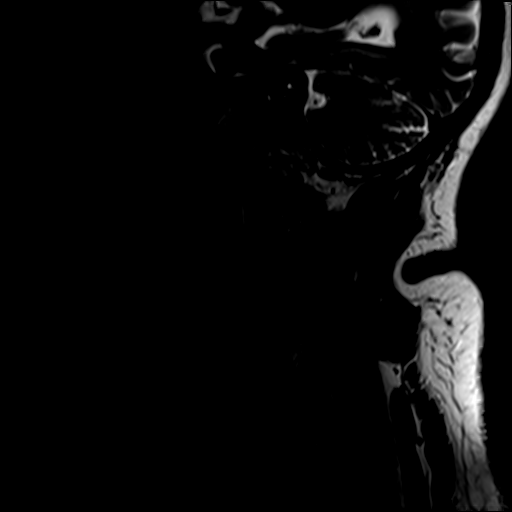
[im 3/17]
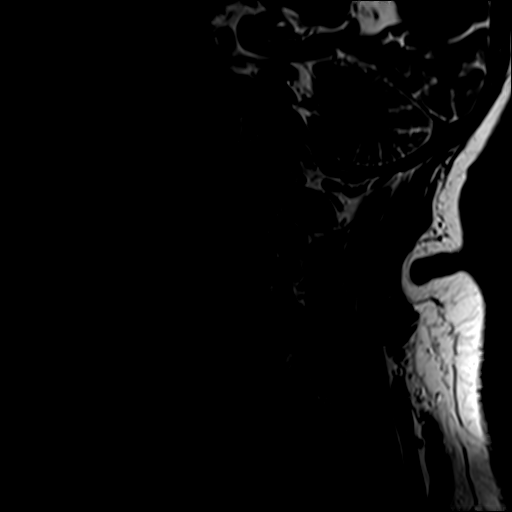
[im 5/17]
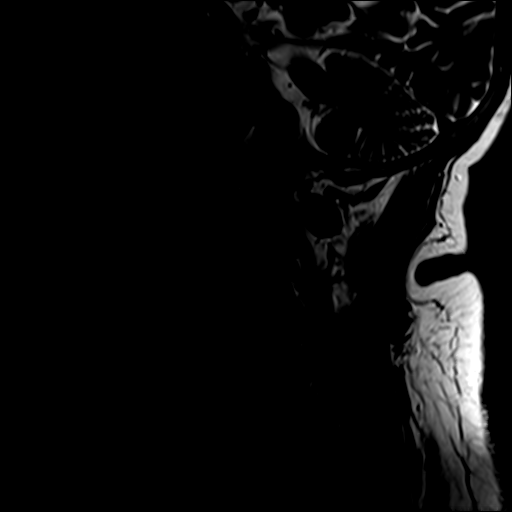
[im 7/17]
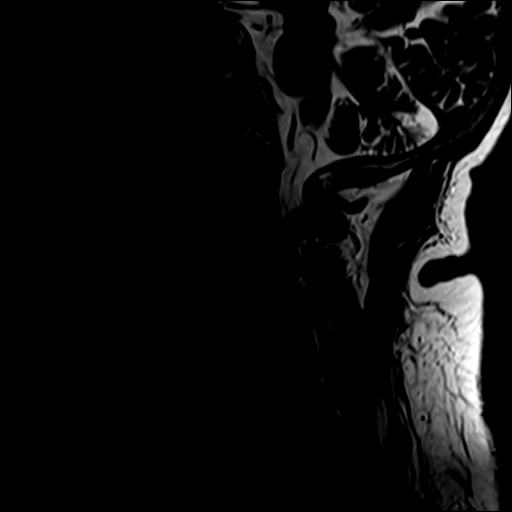
[im 10/17]
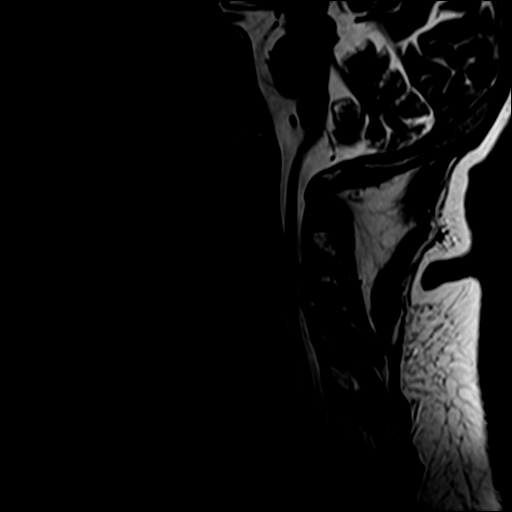
[im 12/17]
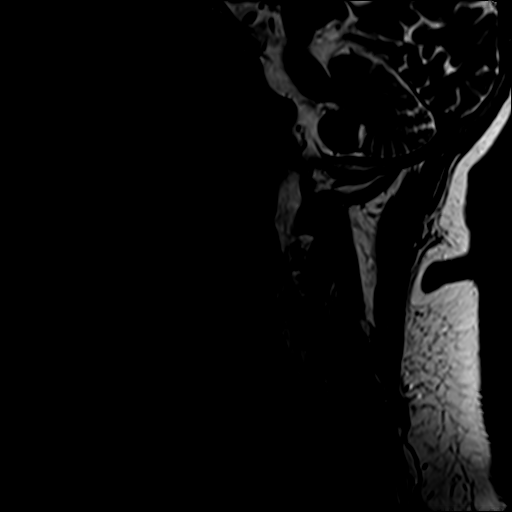
[im 14/17]
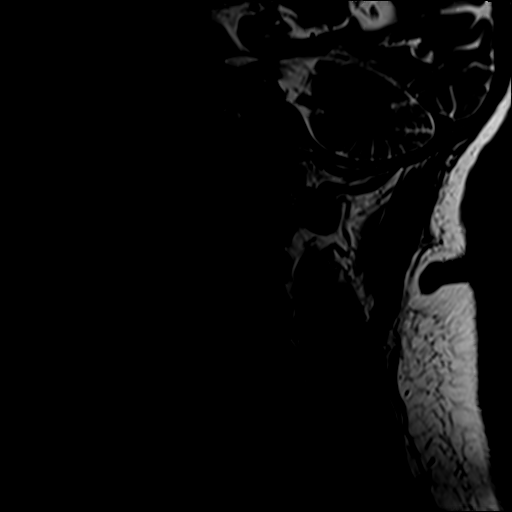
[im 17/17]
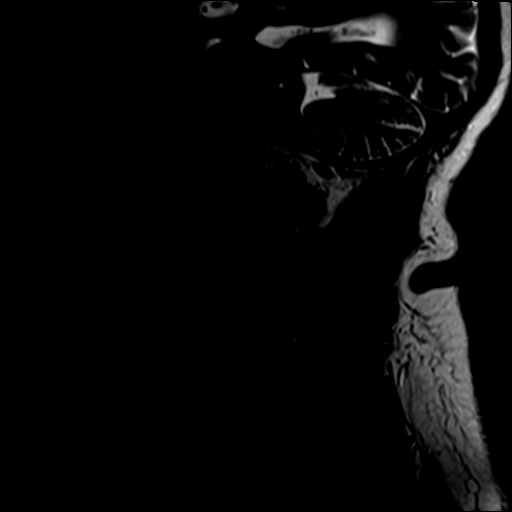

[Series 4: STIR · sagittal · 3.0mm · 0.82mm/px · 8 of 17 slices shown]
[im 1/17]
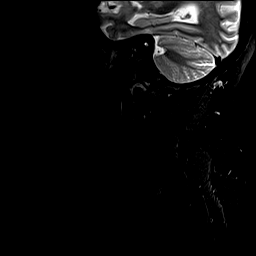
[im 3/17]
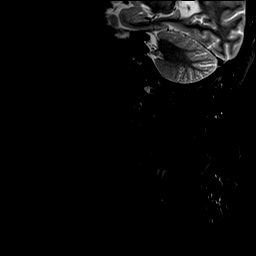
[im 5/17]
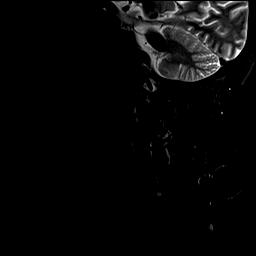
[im 7/17]
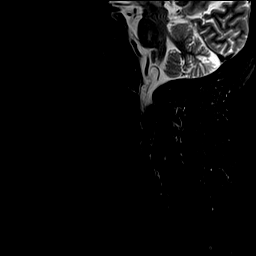
[im 10/17]
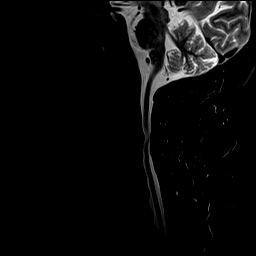
[im 12/17]
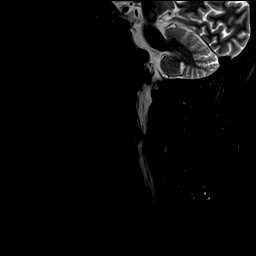
[im 14/17]
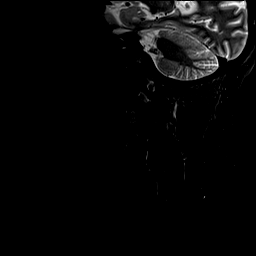
[im 17/17]
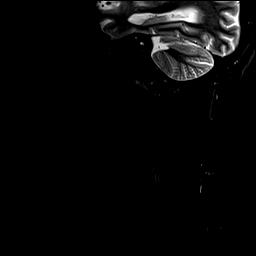

[Series 5: T1 · sagittal · 3.0mm · 0.82mm/px · 8 of 17 slices shown]
[im 1/17]
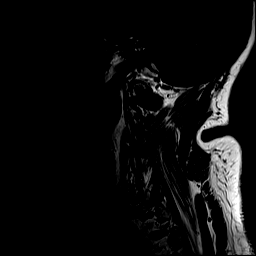
[im 3/17]
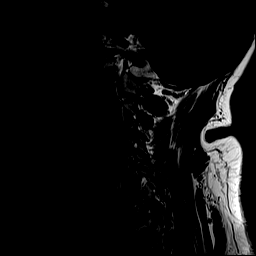
[im 5/17]
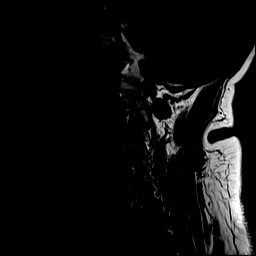
[im 7/17]
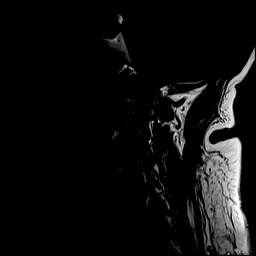
[im 10/17]
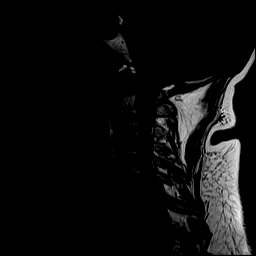
[im 12/17]
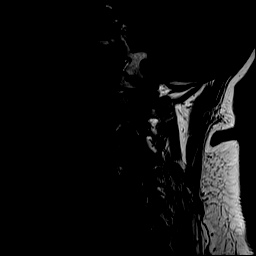
[im 14/17]
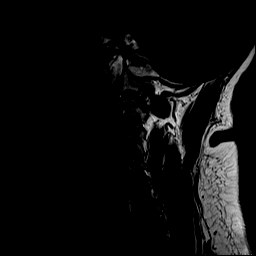
[im 17/17]
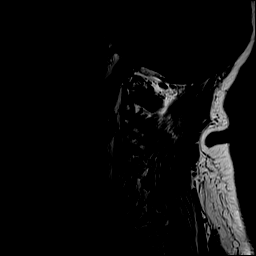

[Series 6: T2 · axial · 3.0mm · 0.70mm/px · z∈[-241,-150]mm · 9 of 26 slices shown (2 of 2)]
[im 1/26]
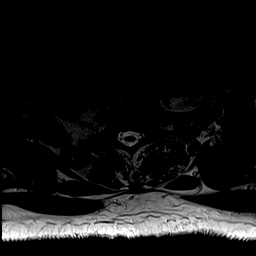
[im 5/26]
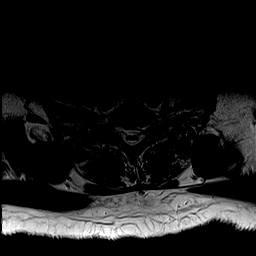
[im 7/26]
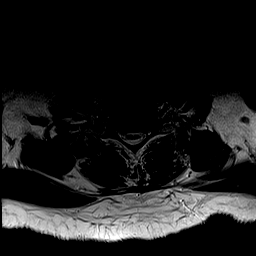
[im 12/26]
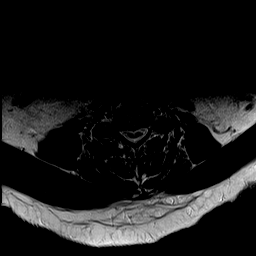
[im 14/26]
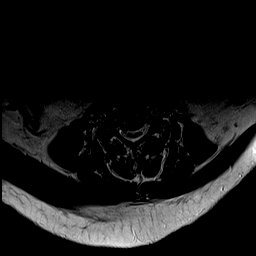
[im 19/26]
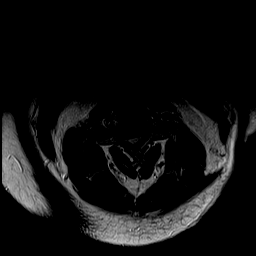
[im 21/26]
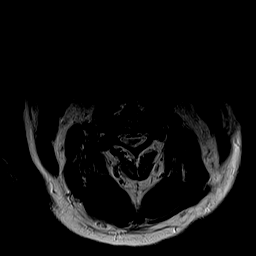
[im 23/26]
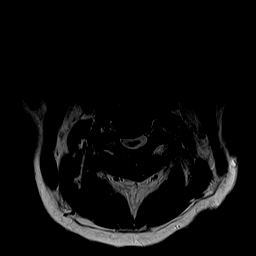
[im 26/26]
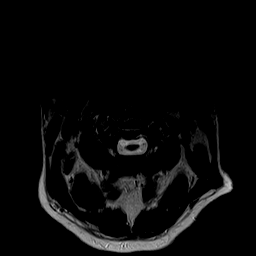

[Series 7: GRE · axial · 3.0mm · 0.35mm/px · z∈[-241,-226]mm · 2 of 26 slices shown]
[im 1/26]
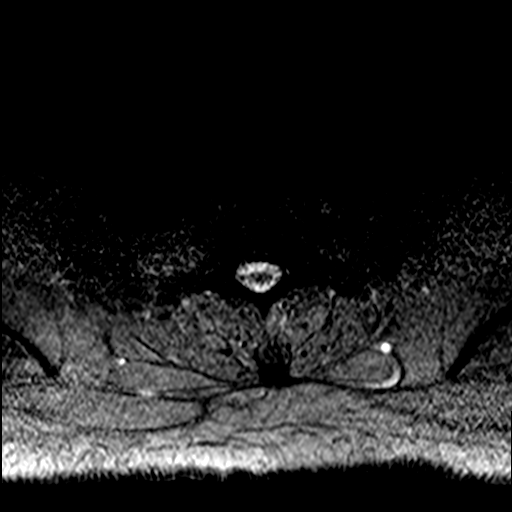
[im 5/26]
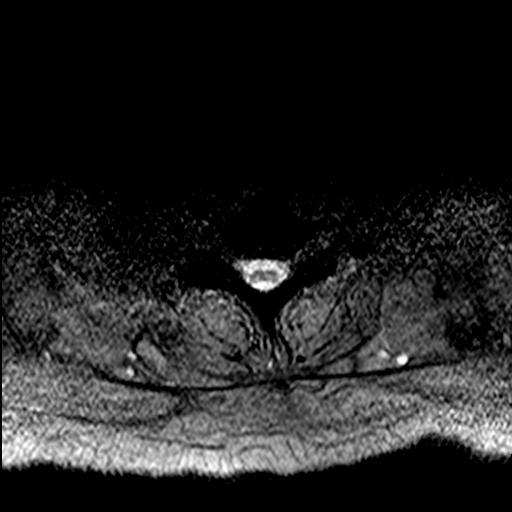

[35 of 48 positions shown; findings below may reference images not displayed]

FINDINGS: Alignment: Straightening of the normal cervical lordosis. No
substantial sagittal subluxation.

Vertebrae: Vertebral body heights are maintained. No focal marrow
edema to suggest acute fracture or discitis/osteomyelitis. No
suspicious bone lesion.

Cord: T2 hyperintensity in the cord at C3-C4, compatible with
myelomalacia or edema.

Posterior Fossa, vertebral arteries, paraspinal tissues: Visualized
vertebral artery flow voids are maintained. No appreciable
paraspinal edema. No evidence of acute abnormality in the visualized
posterior fossa.

Disc levels:

C2-C3: Right eccentric posterior disc osteophyte complex and
right-sided facet and uncovertebral hypertrophy. Resulting moderate
to severe right foraminal stenosis. No significant canal or left
foraminal stenosis.

C3-C4: Posterior disc osteophyte complex and bilateral facet and
uncovertebral hypertrophy. Resulting severe canal and bilateral
foraminal stenosis. Compression of the cord.

C4-C5: Posterior disc osteophyte complex with right greater than
left facet and uncovertebral hypertrophy. Resulting moderate canal
stenosis with disc deforming the ventral cord. Severe right and
moderate to severe left foraminal stenosis.

C5-C6: Posterior disc osteophyte complex with right greater than
left facet and uncovertebral hypertrophy. Resulting moderate to
severe right and moderate left foraminal stenosis. Mild canal
stenosis.

C6-C7: Posterior disc osteophyte complex with bilateral facet and
uncovertebral hypertrophy. Moderate to severe bilateral foraminal
stenosis. Mild canal stenosis.

C7-T1: Posterior disc osteophyte complex with left greater than
right facet and uncovertebral hypertrophy. Resulting mild bilateral
foraminal stenosis. No significant canal stenosis.
IMPRESSION: 1. Severe canal stenosis at C3-C4 with compression of the cord. T2
hyperintensity within the cord at this level may represent
myelomalacia and/or edema. Moderate canal stenosis at C4-C5 and mild
canal stenosis C5-C6 and C6-C7.
2. Severe foraminal stenosis bilaterally at C3-C4 and on the right
at C4-C5. Moderate to severe foraminal stenosis on the right at
C2-C3 and C5-C6 and bilaterally at C6-C7.

These results will be called to the ordering clinician or
representative by the Radiologist Assistant, and communication
documented in the PACS or [REDACTED].

## 2021-04-23 ENCOUNTER — Telehealth: Payer: Self-pay | Admitting: Neurology

## 2021-04-23 ENCOUNTER — Other Ambulatory Visit: Payer: Self-pay

## 2021-04-23 DIAGNOSIS — G9520 Unspecified cord compression: Secondary | ICD-10-CM

## 2021-04-23 DIAGNOSIS — M48061 Spinal stenosis, lumbar region without neurogenic claudication: Secondary | ICD-10-CM

## 2021-04-23 DIAGNOSIS — M4802 Spinal stenosis, cervical region: Secondary | ICD-10-CM

## 2021-04-23 NOTE — Telephone Encounter (Signed)
Referral has been created and faxed to [facility] as a Urgent referral.

## 2021-04-23 NOTE — Telephone Encounter (Signed)
I called and informed patient of his MRI cervical and lumbar spine results which shows severe canal stenosis with compression at C3-4 and myelomalacia at this level, as well as multilevel biforaminal stenosis.  Imaging of the lumbar spine shows severe canal stenosis at L4-5, no cord changes.  Findings are consistent with his exam and symptoms.   He will be referred to [facility] for urgent surgical evaluation.  Patient informed that if symptoms acutely get worse (weakness, incontinence, paresthesias), he needs to go to the ER.   Maddeline Roorda K. Allena Katz, DO

## 2021-04-24 DIAGNOSIS — M4712 Other spondylosis with myelopathy, cervical region: Secondary | ICD-10-CM | POA: Diagnosis not present

## 2021-04-24 DIAGNOSIS — M4316 Spondylolisthesis, lumbar region: Secondary | ICD-10-CM | POA: Diagnosis not present

## 2021-04-25 DIAGNOSIS — Z96652 Presence of left artificial knee joint: Secondary | ICD-10-CM | POA: Diagnosis not present

## 2021-04-25 DIAGNOSIS — M1712 Unilateral primary osteoarthritis, left knee: Secondary | ICD-10-CM | POA: Diagnosis not present

## 2021-04-27 ENCOUNTER — Other Ambulatory Visit: Payer: Self-pay | Admitting: Neurosurgery

## 2021-04-28 ENCOUNTER — Other Ambulatory Visit: Payer: Self-pay | Admitting: Neurosurgery

## 2021-05-01 NOTE — Pre-Procedure Instructions (Signed)
Surgical Instructions    Your procedure is scheduled on Wednesday 05/06/21.   Report to [facility] Main Entrance "A" at 10:30 A.M., then check in with the Admitting office.  Call this number if you have problems the morning of surgery:  [phone]   If you have any questions prior to your surgery date call [phone]: Open Monday-Friday 8am-4pm    Remember:  Do not eat or drink after midnight the night before your surgery     Take these medicines the morning of surgery with A SIP OF WATER   cetirizine (ZYRTEC)   omeprazole (PRILOSEC)    As of today, STOP taking any Aspirin (unless otherwise instructed by your surgeon) Aleve, Naproxen, Ibuprofen, Motrin, Advil, Goody's, BC's, all herbal medications, fish oil, and all vitamins.          Do not wear jewelry or makeup Do not wear lotions, powders, perfumes/colognes, or deodorant. Do not shave 48 hours prior to surgery.  Men may shave face and neck. Do not bring valuables to the hospital. DO Not wear nail polish, gel polish, artificial nails, or any other type of covering on natural nails including finger and toenails. If patients have artificial nails, gel coating, etc. that need to be removed by a nail salon please have this removed prior to surgery or surgery may need to be canceled/delayed if the surgeon/ anesthesia feels like the patient is unable to be adequately monitored.             [facility] is not responsible for any belongings or valuables.  Do NOT Smoke (Tobacco/Vaping)  24 hours prior to your procedure If you use a CPAP at night, you may bring your mask for your overnight stay.   Contacts, glasses, dentures or bridgework may not be worn into surgery, please bring cases for these belongings   For patients admitted to the hospital, discharge time will be determined by your treatment team.   Patients discharged the day of surgery will not be allowed to drive home, and someone needs to stay with them for 24  hours.  NO VISITORS WILL BE ALLOWED IN PRE-OP WHERE PATIENTS GET READY FOR SURGERY.  ONLY 1 SUPPORT PERSON MAY BE PRESENT WHILE YOU ARE IN SURGERY.  IF YOU ARE TO BE ADMITTED, ONCE YOU ARE IN YOUR ROOM YOU WILL BE ALLOWED TWO (2) VISITORS.  Minor children may have two parents present. Special consideration for safety and communication needs will be reviewed on a case by case basis.  Special instructions:    Oral Hygiene is also important to reduce your risk of infection.  Remember - BRUSH YOUR TEETH THE MORNING OF SURGERY WITH YOUR REGULAR TOOTHPASTE   [place]- Preparing For Surgery  Before surgery, you can play an important role. Because skin is not sterile, your skin needs to be as free of germs as possible. You can reduce the number of germs on your skin by washing with CHG (chlorahexidine gluconate) Soap before surgery.  CHG is an antiseptic cleaner which kills germs and bonds with the skin to continue killing germs even after washing.     Please do not use if you have an allergy to CHG or antibacterial soaps. If your skin becomes reddened/irritated stop using the CHG.  Do not shave (including legs and underarms) for at least 48 hours prior to first CHG shower. It is OK to shave your face.  Please follow these instructions carefully.     Shower the [employer] [facility] and  the MORNING OF SURGERY with CHG Soap.   If you chose to wash your hair, wash your hair first as usual with your normal shampoo. After you shampoo, rinse your hair and body thoroughly to remove the shampoo.  Then [employer] and genitals (private parts) with your normal soap and rinse thoroughly to remove soap.  After that Use CHG Soap as you would any other liquid soap. You can apply CHG directly to the skin and wash gently with a scrungie or a clean washcloth.   Apply the CHG Soap to your body ONLY FROM THE NECK DOWN.  Do not use on open wounds or open sores. Avoid contact with your eyes, ears, mouth and genitals  (private parts). Wash Face and genitals (private parts)  with your normal soap.   Wash thoroughly, paying special attention to the area where your surgery will be performed.  Thoroughly rinse your body with warm water from the neck down.  DO NOT shower/wash with your normal soap after using and rinsing off the CHG Soap.  Pat yourself dry with a CLEAN TOWEL.  Wear CLEAN PAJAMAS to bed the night before surgery  Place CLEAN SHEETS on your bed the night before your surgery  DO NOT SLEEP WITH PETS.   Day of Surgery:  Take a shower with CHG soap. Wear Clean/Comfortable clothing the morning of surgery Do not apply any deodorants/lotions.   Remember to brush your teeth WITH YOUR REGULAR TOOTHPASTE.   Please read over the following fact sheets that you were given.

## 2021-05-04 ENCOUNTER — Encounter (HOSPITAL_COMMUNITY): Payer: Self-pay

## 2021-05-04 ENCOUNTER — Encounter (HOSPITAL_COMMUNITY)
Admission: RE | Admit: 2021-05-04 | Discharge: 2021-05-04 | Disposition: A | Discharge status: Home / Self Care | Payer: Medicare HMO | Source: Ambulatory Visit | Attending: Neurosurgery | Admitting: Neurosurgery

## 2021-05-04 ENCOUNTER — Other Ambulatory Visit: Payer: Self-pay

## 2021-05-04 DIAGNOSIS — Z20822 Contact with and (suspected) exposure to covid-19: Secondary | ICD-10-CM | POA: Insufficient documentation

## 2021-05-04 DIAGNOSIS — Z01812 Encounter for preprocedural laboratory examination: Secondary | ICD-10-CM | POA: Insufficient documentation

## 2021-05-04 LAB — CBC
HCT: 37.3 % — ABNORMAL LOW (ref 39.0–52.0)
Hemoglobin: 12.2 g/dL — ABNORMAL LOW (ref 13.0–17.0)
MCH: 32.3 pg (ref 26.0–34.0)
MCHC: 32.7 g/dL (ref 30.0–36.0)
MCV: 98.7 fL (ref 80.0–100.0)
Platelets: 192 10*3/uL (ref 150–400)
RBC: 3.78 MIL/uL — ABNORMAL LOW (ref 4.22–5.81)
RDW: 12.6 % (ref 11.5–15.5)
WBC: 8.6 10*3/uL (ref 4.0–10.5)
nRBC: 0 % (ref 0.0–0.2)

## 2021-05-04 LAB — SURGICAL PCR SCREEN
MRSA, PCR: NEGATIVE
Staphylococcus aureus: POSITIVE — AB

## 2021-05-04 LAB — TYPE AND SCREEN
ABO/RH(D): A POS
Antibody Screen: NEGATIVE

## 2021-05-04 LAB — SARS CORONAVIRUS 2 (TAT 6-24 HRS): SARS Coronavirus 2: NEGATIVE

## 2021-05-04 NOTE — Progress Notes (Signed)
PCP - Elige Radon. Dettinger, MD Cardiologist - Denies  PPM/ICD - Denies  Chest x-ray - N/A EKG - N/A Stress Test - Per pt, > 10 years ago, results were negative ECHO - Denies Cardiac Cath - Denies  Sleep Study - Denies  DM- Denies  Blood Thinner Instructions: N/A Aspirin Instructions: Pt instructed to STOP taking any Aspirin (unless otherwise instructed by your surgeon), Aleve, Naproxen, Ibuprofen, Motrin, Advil, Goody's, BC's, all herbal medications, fish oil, and all vitamins as of today.   ERAS Protcol - N/A PRE-SURGERY Ensure or G2- N/A  COVID TEST- 05/04/21; results pending   Anesthesia review: No  Patient denies shortness of breath, fever, cough and chest pain at PAT appointment   All instructions explained to the patient, with a verbal understanding of the material. Patient agrees to go over the instructions while at home for a better understanding.  The opportunity to ask questions was provided.

## 2021-05-06 ENCOUNTER — Ambulatory Visit (HOSPITAL_COMMUNITY): Payer: Medicare HMO | Admitting: Anesthesiology

## 2021-05-06 ENCOUNTER — Observation Stay (HOSPITAL_COMMUNITY)
Admission: RE | Admit: 2021-05-06 | Discharge: 2021-05-07 | Disposition: A | Discharge status: Home / Self Care | Payer: Medicare HMO | Attending: Neurosurgery | Admitting: Neurosurgery

## 2021-05-06 ENCOUNTER — Ambulatory Visit (HOSPITAL_COMMUNITY): Payer: Medicare HMO

## 2021-05-06 ENCOUNTER — Encounter (HOSPITAL_COMMUNITY)
Admission: RE | Disposition: A | Discharge status: Home / Self Care | Payer: Self-pay | Source: Home / Self Care | Attending: Neurosurgery

## 2021-05-06 ENCOUNTER — Encounter (HOSPITAL_COMMUNITY): Payer: Self-pay | Admitting: Anesthesiology

## 2021-05-06 DIAGNOSIS — G959 Disease of spinal cord, unspecified: Secondary | ICD-10-CM | POA: Diagnosis not present

## 2021-05-06 DIAGNOSIS — M4322 Fusion of spine, cervical region: Secondary | ICD-10-CM | POA: Diagnosis not present

## 2021-05-06 DIAGNOSIS — Z981 Arthrodesis status: Secondary | ICD-10-CM | POA: Diagnosis not present

## 2021-05-06 DIAGNOSIS — M4802 Spinal stenosis, cervical region: Principal | ICD-10-CM | POA: Insufficient documentation

## 2021-05-06 DIAGNOSIS — K219 Gastro-esophageal reflux disease without esophagitis: Secondary | ICD-10-CM | POA: Diagnosis not present

## 2021-05-06 DIAGNOSIS — G992 Myelopathy in diseases classified elsewhere: Secondary | ICD-10-CM | POA: Diagnosis present

## 2021-05-06 DIAGNOSIS — M4312 Spondylolisthesis, cervical region: Secondary | ICD-10-CM | POA: Insufficient documentation

## 2021-05-06 DIAGNOSIS — Z419 Encounter for procedure for purposes other than remedying health state, unspecified: Secondary | ICD-10-CM

## 2021-05-06 DIAGNOSIS — E7800 Pure hypercholesterolemia, unspecified: Secondary | ICD-10-CM | POA: Diagnosis not present

## 2021-05-06 HISTORY — PX: ANTERIOR CERVICAL DECOMP/DISCECTOMY FUSION: SHX1161

## 2021-05-06 IMAGING — RF DG CERVICAL SPINE 2 OR 3 VIEWS
1 series · 4 of 4 positions shown · non-contrast
Comparison: [DATE]

CLINICAL DATA: Cervical discectomy and fusion

EXAM:
CERVICAL SPINE - 2-3 VIEW

[Series 1: run · 4 of 4 slices shown]
[im 1/4]
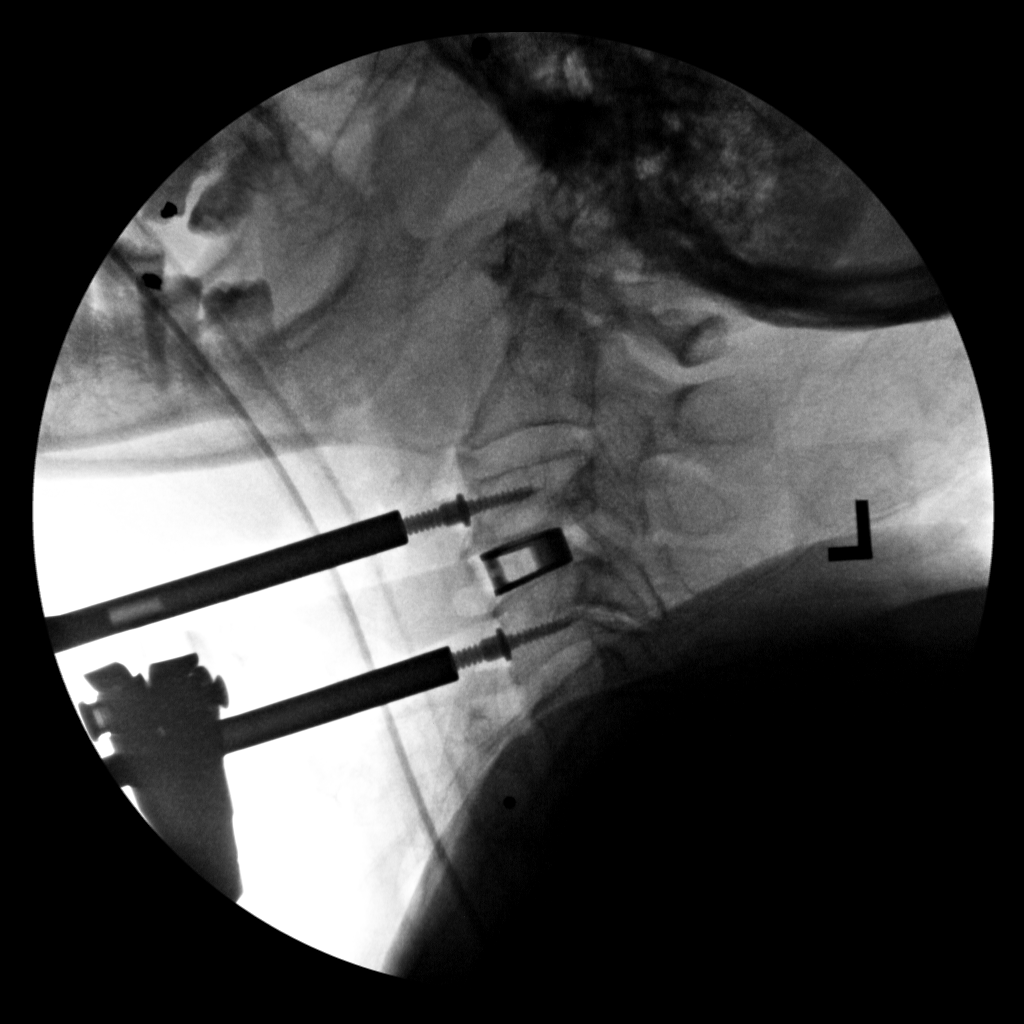
[im 2/4]
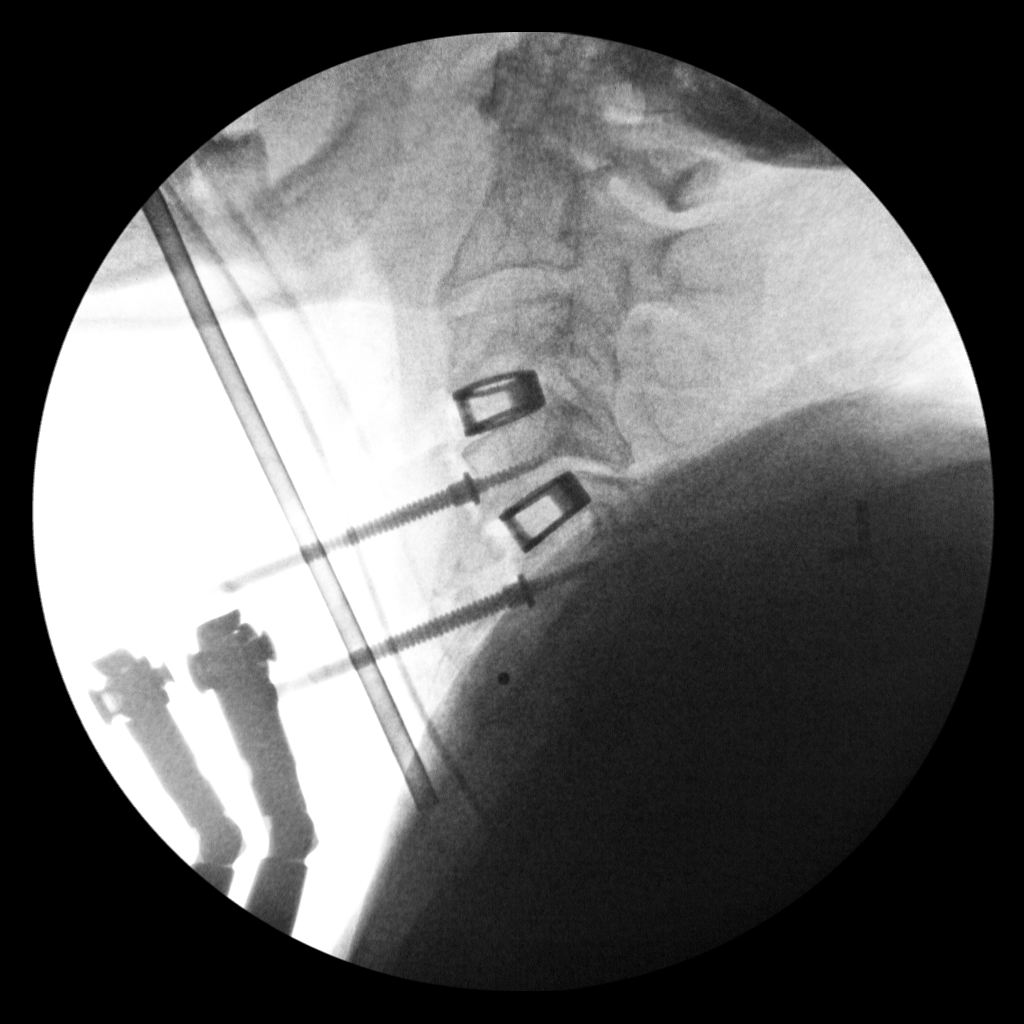
[im 3/4]
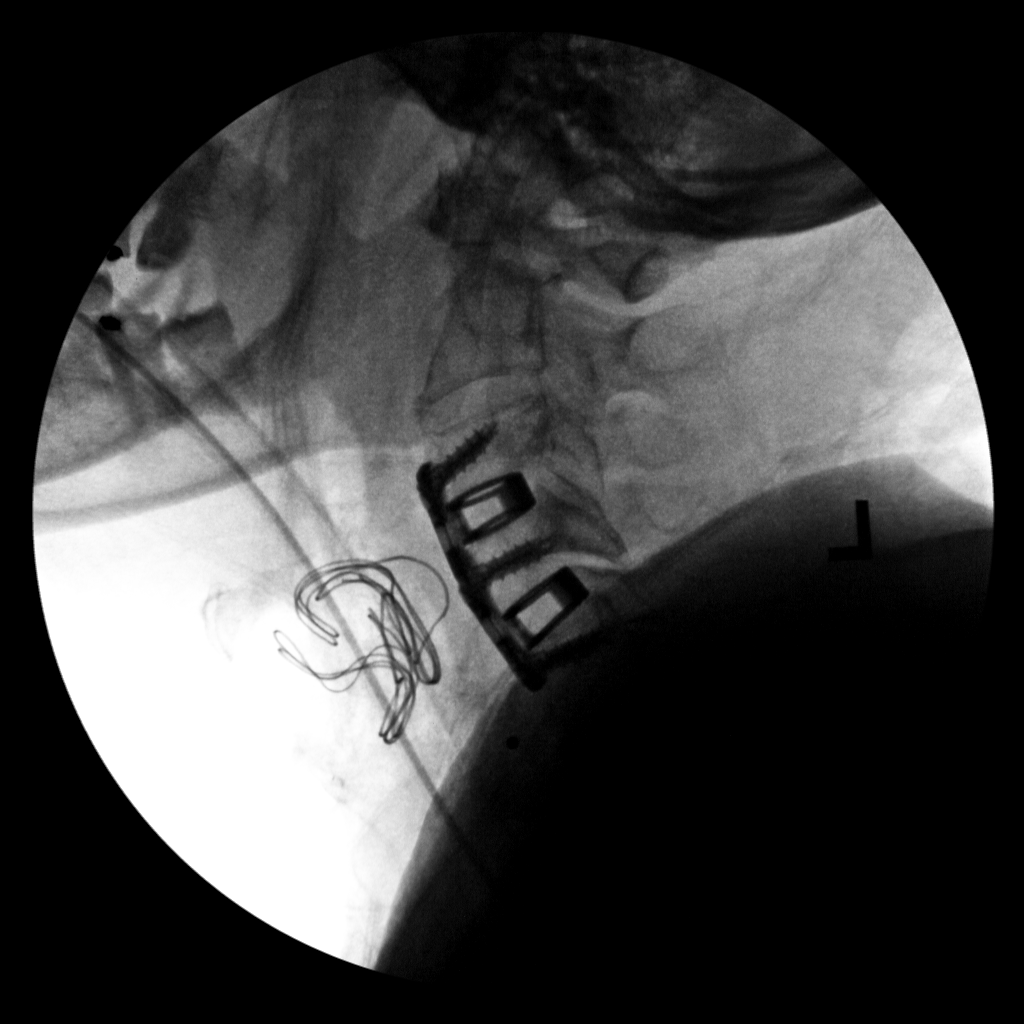
[im 4/4]
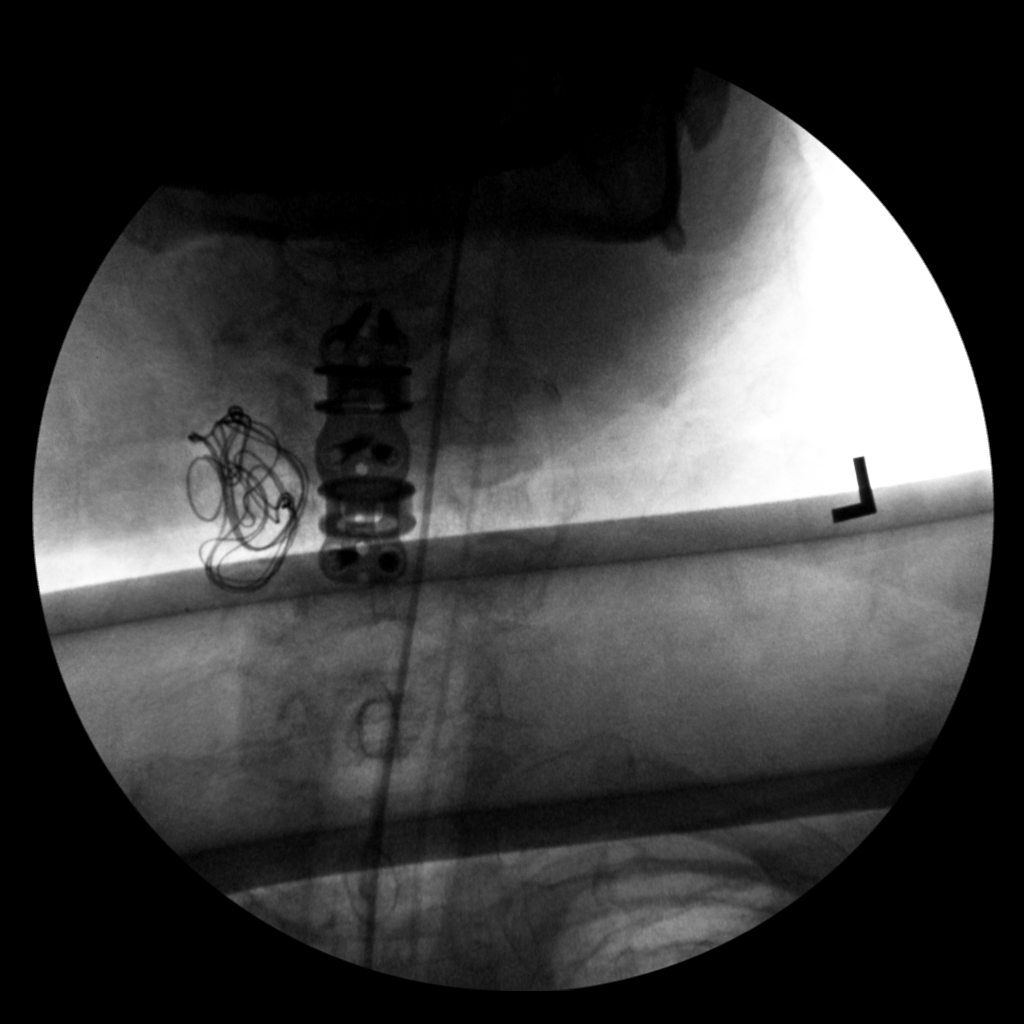

[4 of 4 positions shown; findings below may reference images not displayed]

FLUOROSCOPY TIME:  Radiation Exposure Index (as provided by the
fluoroscopic device): 1.89 mGy

If the device does not provide the exposure index:

Fluoroscopy Time:  21 seconds

Number of Acquired Images:  4
FINDINGS: Initial image demonstrates interbody fusion at C3-4 with screw
placement. Subsequent placement of interbody fusion at C4-5 is
noted. Anterior fixation plate is then placed in satisfactory
position.
IMPRESSION: Cervical fusion at C3-4 and C4-5

## 2021-05-06 IMAGING — RF Imaging study
1 series · 4 of 4 positions shown · non-contrast
Comparison: [DATE]

CLINICAL DATA: Cervical discectomy and fusion

EXAM:
CERVICAL SPINE - 2-3 VIEW

[Series 1: run · 4 of 4 slices shown]
[im 1/4]
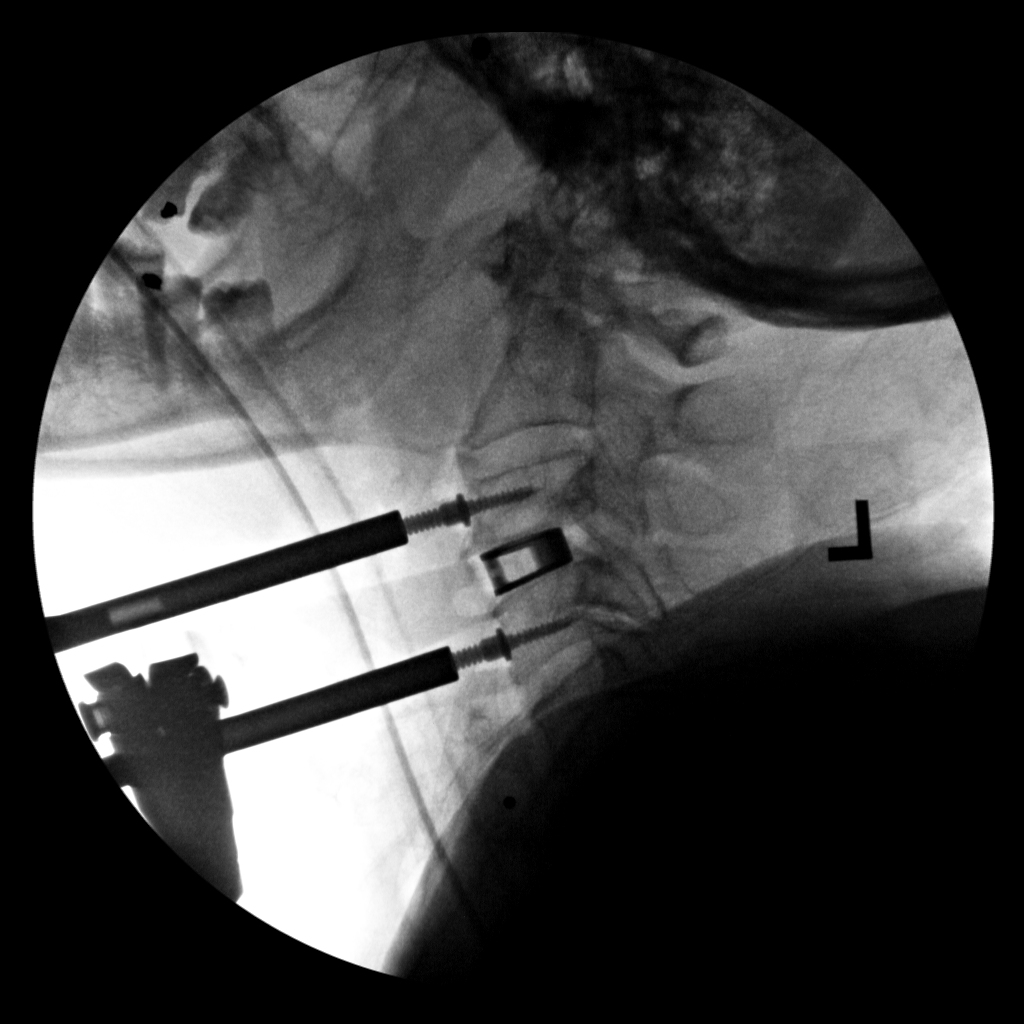
[im 2/4]
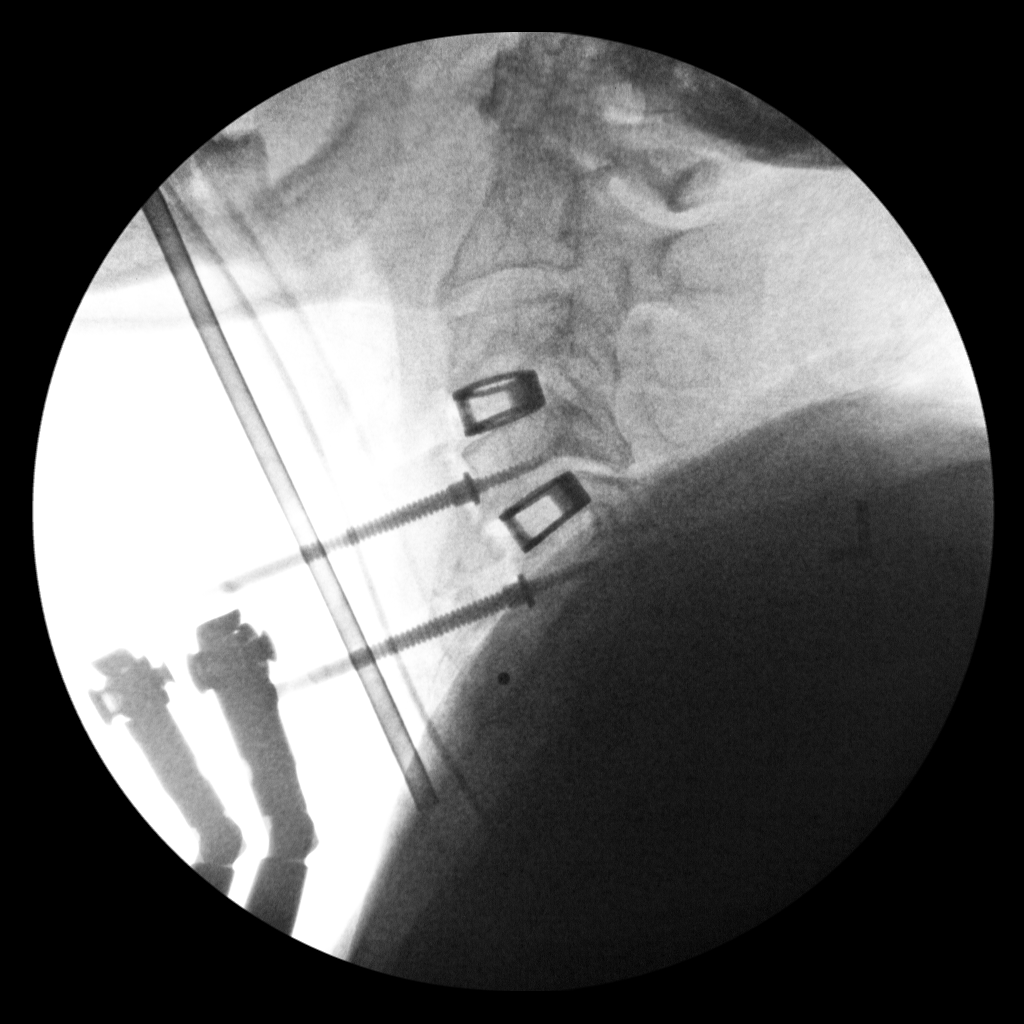
[im 3/4]
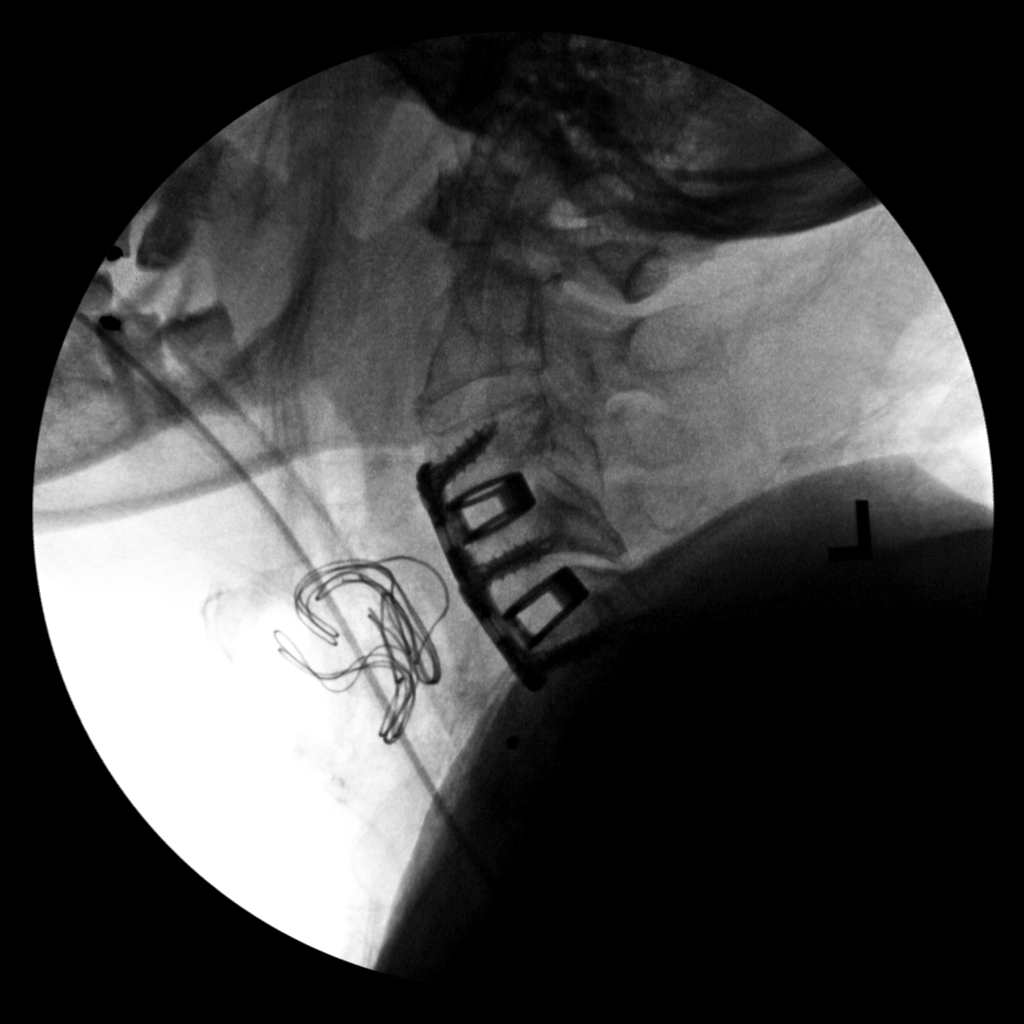
[im 4/4]
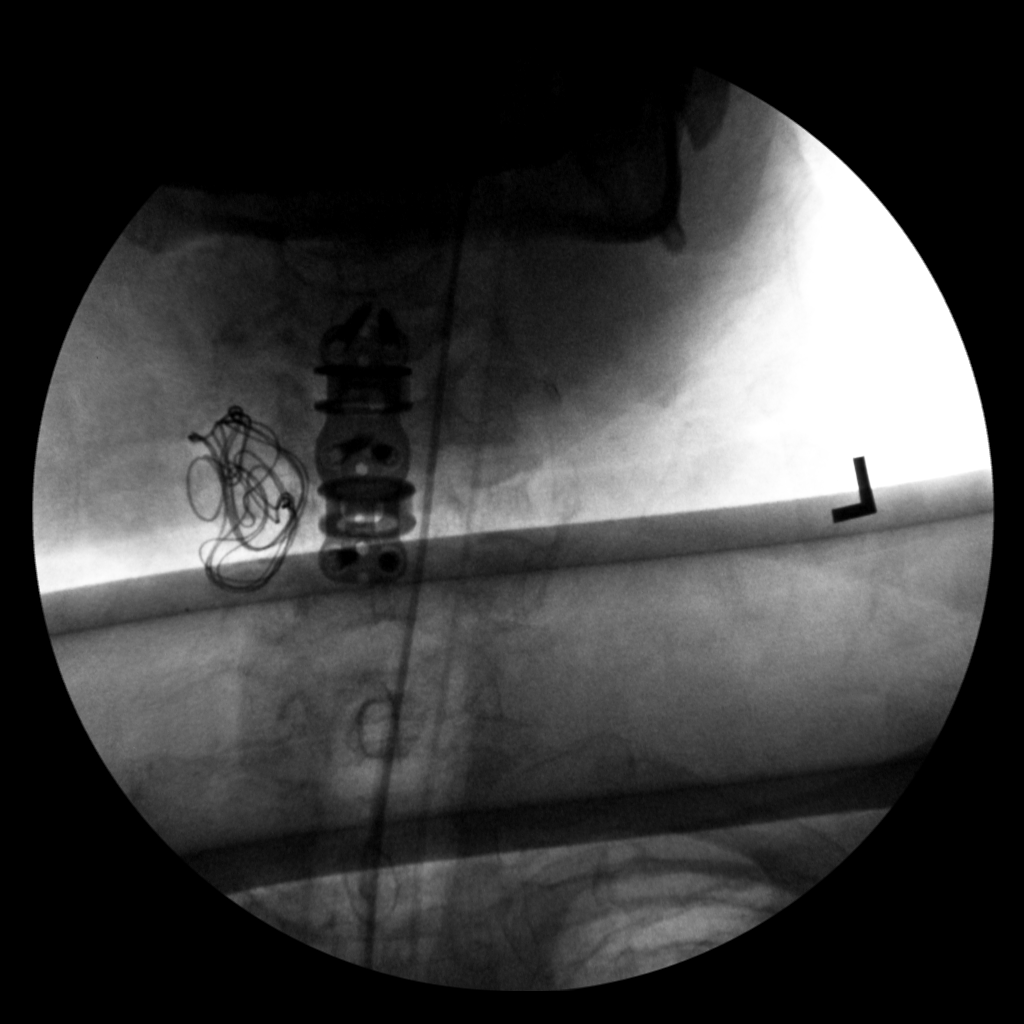

[4 of 4 positions shown; findings below may reference images not displayed]

FLUOROSCOPY TIME:  Radiation Exposure Index (as provided by the
fluoroscopic device): 1.89 mGy

If the device does not provide the exposure index:

Fluoroscopy Time:  21 seconds

Number of Acquired Images:  4
FINDINGS: Initial image demonstrates interbody fusion at C3-4 with screw
placement. Subsequent placement of interbody fusion at C4-5 is
noted. Anterior fixation plate is then placed in satisfactory
position.
IMPRESSION: Cervical fusion at C3-4 and C4-5

## 2021-05-06 SURGERY — ANTERIOR CERVICAL DECOMPRESSION/DISCECTOMY FUSION 2 LEVELS
Anesthesia: General

## 2021-05-06 MED ORDER — PANTOPRAZOLE SODIUM 40 MG PO TBEC
40.0000 mg | DELAYED_RELEASE_TABLET | Freq: Every day | ORAL | Status: DC
  Administered 2021-05-06 – 2021-05-07 (×2): 40 mg via ORAL
  Filled 2021-05-06 (×2): qty 1

## 2021-05-06 MED ORDER — SUGAMMADEX SODIUM 200 MG/2ML IV SOLN
INTRAVENOUS | Status: DC | PRN
  Administered 2021-05-06: 200 mg via INTRAVENOUS

## 2021-05-06 MED ORDER — LIDOCAINE HCL (PF) 2 % IJ SOLN
INTRAMUSCULAR | Status: AC
  Filled 2021-05-06: qty 5

## 2021-05-06 MED ORDER — THROMBIN 5000 UNITS EX SOLR
CUTANEOUS | Status: AC
  Filled 2021-05-06: qty 5000

## 2021-05-06 MED ORDER — CHLORHEXIDINE GLUCONATE 0.12 % MT SOLN
15.0000 mL | Freq: Once | OROMUCOSAL | Status: AC
  Administered 2021-05-06: 15 mL via OROMUCOSAL

## 2021-05-06 MED ORDER — ACETAMINOPHEN 325 MG PO TABS
650.0000 mg | ORAL_TABLET | ORAL | Status: DC | PRN
  Administered 2021-05-06: 650 mg via ORAL
  Filled 2021-05-06: qty 2

## 2021-05-06 MED ORDER — SODIUM CHLORIDE 0.9 % IV SOLN
250.0000 mL | INTRAVENOUS | Status: DC
  Administered 2021-05-06: 250 mL via INTRAVENOUS

## 2021-05-06 MED ORDER — CEFAZOLIN SODIUM-DEXTROSE 2-4 GM/100ML-% IV SOLN
2.0000 g | Freq: Three times a day (TID) | INTRAVENOUS | Status: AC
  Administered 2021-05-06 – 2021-05-07 (×2): 2 g via INTRAVENOUS
  Filled 2021-05-06 (×2): qty 100

## 2021-05-06 MED ORDER — FENTANYL CITRATE (PF) 250 MCG/5ML IJ SOLN
INTRAMUSCULAR | Status: AC
  Filled 2021-05-06: qty 5

## 2021-05-06 MED ORDER — ROCURONIUM BROMIDE 10 MG/ML (PF) SYRINGE
PREFILLED_SYRINGE | INTRAVENOUS | Status: AC
  Filled 2021-05-06: qty 10

## 2021-05-06 MED ORDER — LACTATED RINGERS IV SOLN: INTRAVENOUS | Status: DC

## 2021-05-06 MED ORDER — OXYCODONE HCL 5 MG PO TABS: 5.0000 mg | ORAL_TABLET | Freq: Once | ORAL | Status: DC | PRN

## 2021-05-06 MED ORDER — LIDOCAINE-EPINEPHRINE 1 %-1:100000 IJ SOLN
INTRAMUSCULAR | Status: DC | PRN
  Administered 2021-05-06: 3.5 mL

## 2021-05-06 MED ORDER — ORAL CARE MOUTH RINSE: 15.0000 mL | Freq: Once | OROMUCOSAL | Status: AC

## 2021-05-06 MED ORDER — PROPOFOL 10 MG/ML IV BOLUS
INTRAVENOUS | Status: DC | PRN
  Administered 2021-05-06: 180 mg via INTRAVENOUS

## 2021-05-06 MED ORDER — POTASSIUM CHLORIDE IN NACL 20-0.9 MEQ/L-% IV SOLN: INTRAVENOUS | Status: DC

## 2021-05-06 MED ORDER — 0.9 % SODIUM CHLORIDE (POUR BTL) OPTIME
TOPICAL | Status: DC | PRN
  Administered 2021-05-06: 1000 mL

## 2021-05-06 MED ORDER — LORATADINE 10 MG PO TABS
10.0000 mg | ORAL_TABLET | Freq: Every day | ORAL | Status: DC
  Administered 2021-05-07: 10 mg via ORAL
  Filled 2021-05-06: qty 1

## 2021-05-06 MED ORDER — METHOCARBAMOL 1000 MG/10ML IJ SOLN
500.0000 mg | Freq: Four times a day (QID) | INTRAVENOUS | Status: DC | PRN
  Filled 2021-05-06: qty 5

## 2021-05-06 MED ORDER — ONDANSETRON HCL 4 MG/2ML IJ SOLN: 4.0000 mg | Freq: Four times a day (QID) | INTRAMUSCULAR | Status: DC | PRN

## 2021-05-06 MED ORDER — ONDANSETRON HCL 4 MG/2ML IJ SOLN
INTRAMUSCULAR | Status: AC
  Filled 2021-05-06: qty 2

## 2021-05-06 MED ORDER — VITAMIN B-12 1000 MCG PO TABS
1000.0000 ug | ORAL_TABLET | Freq: Every day | ORAL | Status: DC
  Administered 2021-05-07: 1000 ug via ORAL
  Filled 2021-05-06: qty 1

## 2021-05-06 MED ORDER — ONDANSETRON HCL 4 MG/2ML IJ SOLN
INTRAMUSCULAR | Status: DC | PRN
  Administered 2021-05-06: 4 mg via INTRAVENOUS

## 2021-05-06 MED ORDER — PHENOL 1.4 % MT LIQD: 1.0000 | OROMUCOSAL | Status: DC | PRN

## 2021-05-06 MED ORDER — ACETAMINOPHEN 650 MG RE SUPP: 650.0000 mg | RECTAL | Status: DC | PRN

## 2021-05-06 MED ORDER — ROCURONIUM BROMIDE 10 MG/ML (PF) SYRINGE
PREFILLED_SYRINGE | INTRAVENOUS | Status: DC | PRN
  Administered 2021-05-06: 50 mg via INTRAVENOUS
  Administered 2021-05-06: 30 mg via INTRAVENOUS

## 2021-05-06 MED ORDER — BUPIVACAINE HCL (PF) 0.5 % IJ SOLN
INTRAMUSCULAR | Status: AC
  Filled 2021-05-06: qty 30

## 2021-05-06 MED ORDER — DEXAMETHASONE SODIUM PHOSPHATE 10 MG/ML IJ SOLN
INTRAMUSCULAR | Status: DC | PRN
  Administered 2021-05-06: 10 mg via INTRAVENOUS

## 2021-05-06 MED ORDER — ONDANSETRON HCL 4 MG PO TABS: 4.0000 mg | ORAL_TABLET | Freq: Four times a day (QID) | ORAL | Status: DC | PRN

## 2021-05-06 MED ORDER — LIDOCAINE-EPINEPHRINE 1 %-1:100000 IJ SOLN
INTRAMUSCULAR | Status: AC
  Filled 2021-05-06: qty 1

## 2021-05-06 MED ORDER — EPHEDRINE SULFATE-NACL 50-0.9 MG/10ML-% IV SOSY
PREFILLED_SYRINGE | INTRAVENOUS | Status: DC | PRN
  Administered 2021-05-06: 5 mg via INTRAVENOUS

## 2021-05-06 MED ORDER — THROMBIN 5000 UNITS EX SOLR
OROMUCOSAL | Status: DC | PRN
  Administered 2021-05-06 (×2): 5 mL via TOPICAL

## 2021-05-06 MED ORDER — CHLORHEXIDINE GLUCONATE CLOTH 2 % EX PADS: 6.0000 | MEDICATED_PAD | Freq: Once | CUTANEOUS | Status: DC

## 2021-05-06 MED ORDER — FENTANYL CITRATE (PF) 250 MCG/5ML IJ SOLN
INTRAMUSCULAR | Status: DC | PRN
  Administered 2021-05-06: 100 ug via INTRAVENOUS
  Administered 2021-05-06 (×3): 50 ug via INTRAVENOUS

## 2021-05-06 MED ORDER — CHLORHEXIDINE GLUCONATE 0.12 % MT SOLN
OROMUCOSAL | Status: AC
  Filled 2021-05-06: qty 15

## 2021-05-06 MED ORDER — VITAMIN D 25 MCG (1000 UNIT) PO TABS
1000.0000 [IU] | ORAL_TABLET | Freq: Every day | ORAL | Status: DC
  Administered 2021-05-07: 1000 [IU] via ORAL
  Filled 2021-05-06: qty 1

## 2021-05-06 MED ORDER — PHENYLEPHRINE 40 MCG/ML (10ML) SYRINGE FOR IV PUSH (FOR BLOOD PRESSURE SUPPORT)
PREFILLED_SYRINGE | INTRAVENOUS | Status: DC | PRN
  Administered 2021-05-06 (×2): 80 ug via INTRAVENOUS

## 2021-05-06 MED ORDER — SODIUM CHLORIDE 0.9% FLUSH: 3.0000 mL | INTRAVENOUS | Status: DC | PRN

## 2021-05-06 MED ORDER — OXYCODONE HCL 5 MG PO TABS: 10.0000 mg | ORAL_TABLET | ORAL | Status: DC | PRN

## 2021-05-06 MED ORDER — METHOCARBAMOL 500 MG PO TABS
500.0000 mg | ORAL_TABLET | Freq: Four times a day (QID) | ORAL | Status: DC | PRN
  Administered 2021-05-06: 500 mg via ORAL
  Filled 2021-05-06: qty 1

## 2021-05-06 MED ORDER — OXYCODONE HCL 5 MG/5ML PO SOLN: 5.0000 mg | Freq: Once | ORAL | Status: DC | PRN

## 2021-05-06 MED ORDER — FENTANYL CITRATE (PF) 100 MCG/2ML IJ SOLN
INTRAMUSCULAR | Status: AC
  Filled 2021-05-06: qty 2

## 2021-05-06 MED ORDER — DEXAMETHASONE SODIUM PHOSPHATE 10 MG/ML IJ SOLN
INTRAMUSCULAR | Status: AC
  Filled 2021-05-06: qty 1

## 2021-05-06 MED ORDER — FLEET ENEMA 7-19 GM/118ML RE ENEM: 1.0000 | ENEMA | Freq: Once | RECTAL | Status: DC | PRN

## 2021-05-06 MED ORDER — CEFAZOLIN SODIUM-DEXTROSE 2-4 GM/100ML-% IV SOLN
2.0000 g | INTRAVENOUS | Status: AC
  Administered 2021-05-06: 2 g via INTRAVENOUS

## 2021-05-06 MED ORDER — OXYCODONE HCL 5 MG PO TABS
5.0000 mg | ORAL_TABLET | ORAL | Status: DC | PRN
  Administered 2021-05-06 – 2021-05-07 (×6): 5 mg via ORAL
  Filled 2021-05-06 (×6): qty 1

## 2021-05-06 MED ORDER — PROPOFOL 10 MG/ML IV BOLUS
INTRAVENOUS | Status: AC
  Filled 2021-05-06: qty 20

## 2021-05-06 MED ORDER — CEFAZOLIN SODIUM-DEXTROSE 2-4 GM/100ML-% IV SOLN
INTRAVENOUS | Status: AC
  Filled 2021-05-06: qty 100

## 2021-05-06 MED ORDER — LIDOCAINE 2% (20 MG/ML) 5 ML SYRINGE
INTRAMUSCULAR | Status: DC | PRN
  Administered 2021-05-06: 100 mg via INTRAVENOUS

## 2021-05-06 MED ORDER — MENTHOL 3 MG MT LOZG: 1.0000 | LOZENGE | OROMUCOSAL | Status: DC | PRN

## 2021-05-06 MED ORDER — FENTANYL CITRATE (PF) 100 MCG/2ML IJ SOLN
25.0000 ug | INTRAMUSCULAR | Status: DC | PRN
  Administered 2021-05-06: 25 ug via INTRAVENOUS

## 2021-05-06 MED ORDER — BUPIVACAINE HCL 0.5 % IJ SOLN
INTRAMUSCULAR | Status: DC | PRN
  Administered 2021-05-06: 3.5 mL

## 2021-05-06 MED ORDER — HYDROMORPHONE HCL 1 MG/ML IJ SOLN: 0.5000 mg | INTRAMUSCULAR | Status: DC | PRN

## 2021-05-06 MED ORDER — DOCUSATE SODIUM 100 MG PO CAPS
100.0000 mg | ORAL_CAPSULE | Freq: Two times a day (BID) | ORAL | Status: DC
  Administered 2021-05-06 – 2021-05-07 (×2): 100 mg via ORAL
  Filled 2021-05-06 (×3): qty 1

## 2021-05-06 MED ORDER — SODIUM CHLORIDE 0.9% FLUSH
3.0000 mL | Freq: Two times a day (BID) | INTRAVENOUS | Status: DC
  Administered 2021-05-06: 3 mL via INTRAVENOUS

## 2021-05-06 SURGICAL SUPPLY — 65 items
BAG COUNTER SPONGE SURGICOUNT (BAG) ×4 IMPLANT
BAND RUBBER #18 3X1/16 STRL (MISCELLANEOUS) ×4 IMPLANT
BASKET BONE COLLECTION (BASKET) ×2 IMPLANT
BENZOIN TINCTURE PRP APPL 2/3 (GAUZE/BANDAGES/DRESSINGS) ×2 IMPLANT
BIT DRILL 13 (BIT) ×2 IMPLANT
BIT DRILL NEURO 2X3.1 SFT TUCH (MISCELLANEOUS) ×1 IMPLANT
BLADE CLIPPER SURG (BLADE) IMPLANT
BLADE SURG 15 STRL LF DISP TIS (BLADE) ×1 IMPLANT
BLADE SURG 15 STRL SS (BLADE) ×2
BLADE ULTRA TIP 2M (BLADE) IMPLANT
BUR MATCHSTICK NEURO 3.0 LAGG (BURR) ×2 IMPLANT
CANISTER SUCT 3000ML PPV (MISCELLANEOUS) ×2 IMPLANT
COLLAR CERV LO CONTOUR FIRM DE (SOFTGOODS) ×2 IMPLANT
DECANTER SPIKE VIAL GLASS SM (MISCELLANEOUS) ×2 IMPLANT
DEVICE EDNSKLTN TC NNLCK MED 8 (Cage) ×2 IMPLANT
DRAPE C-ARM 42X72 X-RAY (DRAPES) ×4 IMPLANT
DRAPE HALF SHEET 40X57 (DRAPES) ×2 IMPLANT
DRAPE LAPAROTOMY 100X72 PEDS (DRAPES) ×2 IMPLANT
DRAPE MICROSCOPE LEICA (MISCELLANEOUS) ×2 IMPLANT
DRESSING MEPILEX FLEX 4X4 (GAUZE/BANDAGES/DRESSINGS) ×1 IMPLANT
DRILL NEURO 2X3.1 SOFT TOUCH (MISCELLANEOUS) ×2
DRSG MEPILEX FLEX 4X4 (GAUZE/BANDAGES/DRESSINGS) ×2
DRSG OPSITE 4X5.5 SM (GAUZE/BANDAGES/DRESSINGS) ×4 IMPLANT
DRSG OPSITE POSTOP 4X6 (GAUZE/BANDAGES/DRESSINGS) ×2 IMPLANT
DURAPREP 26ML APPLICATOR (WOUND CARE) ×2 IMPLANT
ELECT COATED BLADE 2.86 ST (ELECTRODE) ×4 IMPLANT
ELECT REM PT RETURN 9FT ADLT (ELECTROSURGICAL) ×2
ELECTRODE REM PT RTRN 9FT ADLT (ELECTROSURGICAL) ×1 IMPLANT
ENDOSKELTON IMPLANT TC MED 8MM (Cage) ×4 IMPLANT
EVACUATOR 1/8 PVC DRAIN (DRAIN) ×2 IMPLANT
GAUZE 4X4 16PLY ~~LOC~~+RFID DBL (SPONGE) ×2 IMPLANT
GLOVE SURG LTX SZ7.5 (GLOVE) ×2 IMPLANT
GLOVE SURG UNDER POLY LF SZ7.5 (GLOVE) ×2 IMPLANT
GOWN STRL REUS W/ TWL LRG LVL3 (GOWN DISPOSABLE) ×2 IMPLANT
GOWN STRL REUS W/ TWL XL LVL3 (GOWN DISPOSABLE) IMPLANT
GOWN STRL REUS W/TWL 2XL LVL3 (GOWN DISPOSABLE) IMPLANT
GOWN STRL REUS W/TWL LRG LVL3 (GOWN DISPOSABLE) ×4
GOWN STRL REUS W/TWL XL LVL3 (GOWN DISPOSABLE)
HEMOSTAT POWDER KIT SURGIFOAM (HEMOSTASIS) ×4 IMPLANT
KIT BASIN OR (CUSTOM PROCEDURE TRAY) ×2 IMPLANT
KIT TURNOVER KIT B (KITS) ×2 IMPLANT
NEEDLE HYPO 22GX1.5 SAFETY (NEEDLE) ×2 IMPLANT
NEEDLE SPNL 22GX3.5 QUINCKE BK (NEEDLE) ×2 IMPLANT
NS IRRIG 1000ML POUR BTL (IV SOLUTION) ×2 IMPLANT
PACK LAMINECTOMY NEURO (CUSTOM PROCEDURE TRAY) ×2 IMPLANT
PAD ARMBOARD 7.5X6 YLW CONV (MISCELLANEOUS) ×6 IMPLANT
PIN DISTRACTION 14MM (PIN) ×4 IMPLANT
PLATE ZEVO 2LVL 43MM (Plate) ×2 IMPLANT
PUTTY DBF GRAFTON 3CC W/DELIVE (Putty) ×2 IMPLANT
SCREW VA SD 3.5X16 (Screw) ×12 IMPLANT
SPONGE INTESTINAL PEANUT (DISPOSABLE) ×2 IMPLANT
SPONGE SURGIFOAM ABS GEL SZ50 (HEMOSTASIS) ×2 IMPLANT
SPONGE T-LAP 4X18 ~~LOC~~+RFID (SPONGE) ×2 IMPLANT
STAPLER VISISTAT 35W (STAPLE) ×2 IMPLANT
STRIP CLOSURE SKIN 1/2X4 (GAUZE/BANDAGES/DRESSINGS) ×2 IMPLANT
SUT MNCRL AB 4-0 PS2 18 (SUTURE) ×2 IMPLANT
SUT SILK 2 0 TIES 10X30 (SUTURE) IMPLANT
SUT VIC AB 0 CT1 27 (SUTURE) ×2
SUT VIC AB 0 CT1 27XBRD ANTBC (SUTURE) ×1 IMPLANT
SUT VIC AB 3-0 SH 8-18 (SUTURE) ×4 IMPLANT
TAPE CLOTH 3X10 TAN LF (GAUZE/BANDAGES/DRESSINGS) ×2 IMPLANT
TIP KERRISON THIN FOOTPLATE 2M (MISCELLANEOUS) ×2 IMPLANT
TOWEL GREEN STERILE (TOWEL DISPOSABLE) ×2 IMPLANT
TOWEL GREEN STERILE FF (TOWEL DISPOSABLE) ×2 IMPLANT
WATER STERILE IRR 1000ML POUR (IV SOLUTION) ×2 IMPLANT

## 2021-05-06 NOTE — Anesthesia Procedure Notes (Signed)
Procedure Name: Intubation Date/Time: 05/06/2021 1:34 PM Performed by: Lovie Chol, CRNA Pre-anesthesia Checklist: Patient identified, Emergency Drugs available, Suction available and Patient being monitored Patient Re-evaluated:Patient Re-evaluated prior to induction Oxygen Delivery Method: Circle System Utilized Preoxygenation: Pre-oxygenation with 100% oxygen Induction Type: IV induction Ventilation: Mask ventilation without difficulty Laryngoscope Size: Glidescope and 3 Grade View: Grade I Tube type: Oral Tube size: 7.5 mm Number of attempts: 1 Airway Equipment and Method: Stylet and Oral airway Placement Confirmation: ETT inserted through vocal cords under direct vision, positive ETCO2 and breath sounds checked- equal and bilateral Secured at: 22 cm Tube secured with: Tape Dental Injury: Teeth and Oropharynx as per pre-operative assessment

## 2021-05-06 NOTE — Transfer of Care (Signed)
Immediate Anesthesia Transfer of Care Note  Patient: Derek [name]  Procedure(s) Performed: Anterior Cervical Discectomy Fusion Cervical Three-Four, Cervical Four-Five  Patient Location: PACU  Anesthesia Type:General  Level of Consciousness: awake, alert , drowsy and patient cooperative  Airway & Oxygen Therapy: Patient Spontanous Breathing and Patient connected to face mask oxygen  Post-op Assessment: Report given to RN, Post -op Vital signs reviewed and stable and Patient moving all extremities X 4  Post vital signs: Reviewed and stable  Last Vitals:  Vitals Value Taken Time  BP 159/72 05/06/21 1637  Temp    Pulse 84 05/06/21 1638  Resp 16 05/06/21 1638  SpO2 97 % 05/06/21 1638  Vitals shown include unvalidated device data.  Last Pain:  Vitals:   05/06/21 1016  TempSrc:   PainSc: 0-No pain         Complications: No notable events documented.

## 2021-05-06 NOTE — Op Note (Signed)
PREOP DIAGNOSIS: Cervical stenosis with myelopathy  POSTOP DIAGNOSIS: Cervical stenosis with myelopathy   PROCEDURE: 1. Arthrodesis C3-4, anterior interbody technique, including Discectomy for decompression of spinal cord and exiting nerve roots with foraminotomies  2. Arthrodesis, additional level C4-5 anterior interbody technique, including Discectomy for decompression of spinal cord and exiting nerve roots with foraminotomies  3. Placement of intervertebral biomechanical device C3-4 4. Placement of intervertebral biomechanical device C4-5 5. Placement of anterior instrumentation consisting of interbody plate and screws C3-4-5 6. Use of morselized bone allograft  7. Use of intraoperative microscope  SURGEON: Dr. Hoyt Koch, MD  ASSISTANT: None  ANESTHESIA: General Endotracheal  EBL: 25 ml  IMPLANTS: Medtronic Titan C 8 mm cage x 2 43 mm Zevo plate 16 mm screws x 6  SPECIMENS: None  DRAINS: None  COMPLICATIONS: None immediate  CONDITION: Hemodynamically stable to PACU  HISTORY:  This is a 75 year old man who presented to clinic with advanced cervical myelopathy that had progressed to the point where he could no longer walk and had significant weakness.  He was found to have severe cervical stenosis with myelomalacia, with the worst of spinal cord compression at C3-4 but severe as well at C4-5.  Risks, benefits, alternatives, and expected convalescence were discussed with the patient.  Risks discussed included but were not limited to bleeding, pain, infection, dysphagia, dysphonia, damage to nearby organs, pseudoarthrosis, hardware failure, adjacent segment disease, CSF leak, neurologic deficits, weakness, numbness, paralysis, coma, and death. After all questions were answered, informed consent was obtained.  PROCEDURE IN DETAIL: The patient was brought to the operating room and transferred to the operative table. After induction of general anesthesia, the patient was  positioned on the operative table in the supine position with all pressure points meticulously padded. The skin of the neck was then prepped and draped in the usual sterile fashion.  After timeout was conducted, the skin was infiltrated with local anesthetic. Skin incision was then made sharply and Bovie electrocautery was used to dissect the subcutaneous tissue until the platysma was identified. The platysma was then divided and undermined. The sternocleidomastoid muscle was then identified and, utilizing natural fascial planes in the neck, the prevertebral fascia was identified and the carotid sheath was retracted laterally and the trachea and esophagus retracted medially. Again using fluoroscopy, the C3-4 disc space was identified. Bovie electrocautery was used to dissect in the subperiosteal plane and elevate the bilateral longus coli muscles. Self-retaining retractors were then placed. Caspar distraction pins were placed in the adjacent bodies to allow for gentle distraction.  At this point, the microscope was draped and brought into the field, and the remainder of the case was done under the microscope using microdissecting technique.  The disc space was incised sharply and combination of high speed drill, curettes, and rongeurs were use to initially complete a discectomy.  Disc space spreader was used to help open up the collapsed disc space, and distraction was maintained with Caspar pins.  The high-speed drill was then used to complete discectomy, including a large calcified posterior osteophyte, and the posterior longitudinal ligament was identified. Using a nerve hook, the PLL was elevated, and Kerrison rongeurs were used to remove the posterior longitudinal ligament and the ventral thecal sac was identified. Using a combination of curettes and rongeurs, complete decompression of the thecal sac and exiting nerve roots at this level was completed, and verified with easy passage of micro-nerve hook  centrally and in the bilateral foramina.  Having completed our decompression, attention was  turned to placement of the intervertebral device. Trial spacers were used to select a size 8 mm graft. This graft was then filled with morcellized allograft, and inserted under live fluoroscopy.  Attention was then turned to the C4-5 level. Caspar distraction pin was placed in the adjacent body to allow for gentle distraction of the disc space.  In a similar fashion, discectomy was completed initially with curettes and rongeurs, and completed with the drill. Disc space spreader was used to help open up the collapsed disc space, and distraction was maintained with Caspar pins.  The PLL was again identified, elevated and incised. Using Kerrison rongeurs, decompression of the spinal cord and exiting roots was completed and confirmed with a dissector. Trial spacers were used to select a 8 mm graft. This graft was then filled with morcellized allograft, and inserted under live fluoroscopy.  After placement of the intervertebral devices, the caspar pins were removed.  An anterior cervical plate was placed across the interspaces for anterior fixation.  Using a high-speed drill, the cortex of the cervical vertebral bodies was punctured, and screws inserted in the vertebral bodies. Final fluoroscopic images in AP and lateral projections were taken to confirm good hardware placement.  At this point, after all counts were verified to be correct, meticulous hemostasis was secured using a combination of bipolar electrocautery and passive hemostatics.  A medium Hemovac drain was placed in the deep cervical space and tunneled out the skin and secured with a stitch.  The platysma muscle was then closed using interrupted 3-0 Vicryl sutures, and the skin was closed with a 4-0 monocryl in subcuticular fashion, followed by benzoin and Steri-Strips. Sterile dressings were then applied and the drapes removed.  The patient tolerated the  procedure well and was extubated in the room and taken to the postanesthesia care unit in stable condition.  All counts were correct at the end of the procedure.

## 2021-05-06 NOTE — H&P (Signed)
HISTORY OF PRESENT ILLNESS:  This is a 75 year old man who presents for evaluation of difficulty walking.  He normally is very active but he has noticed for the past 3 years, he has had progressive difficulty with walking to the point where he is almost wheelchair-bound now.  He has noticed poor balance in his legs and feeling like his legs give out after walking 15 ft.  Additionally, he has fairly constant tingling in his right hand and intermittent tingling in his left hand, and has noticed weakness and loss of disc tear he when using his hands.  He has pain in his neck when he looks down which will extend down his back and often times to his legs and hands.  He has history of recent knee replacement 10 months ago from which he recovered well.  No history of smoking.       PAST MEDICAL/SURGICAL HISTORY:   (Detailed)      Family History:  (Detailed)   Social History:  (Detailed) Tobacco use reviewed. Preferred language is [place].   Tobacco use status: Current non-smoker. Smoking status: Never smoker.  SMOKING STATUS Type Smoking Status Usage Per Day Years Used Total Pack Years  Never smoker         MEDICATIONS: (added, continued or stopped this visit) Started Medication Directions Instruction Stopped  All Day Allergy (cetirizine) 10 mg capsule     cholecalciferol (vitamin D3) 1,250 mcg (50,000 unit) capsule take 1 capsule by oral route  every month    clotrimazole 1 % topical cream apply by topical route 2 times every day to the affected and surrounding areas of skin in the morning and evening    omeprazole 20 mg capsule,delayed release take 1 capsule by oral route  every day 30 minutes to 1 hour before a meal      ALLERGIES: Ingredient Reaction Medication Name Comment NO KNOWN ALLERGIES    No known allergies. Reviewed, no changes.    PHYSICAL EXAM: BP 126/65   Pulse (!) 57   Temp 97.7 F (36.5 C) (Oral)   Resp 17   Ht 5\' 6"   (1.676 m)   Wt 114.5 kg   SpO2 98%   BMI 40.74 kg/m   PHYSICAL EXAM Details General Level of Distress: no acute distress Overall Appearance: Normal  Head and Face  Right Left  Fundoscopic Exam:  unable to visualize unable to visualize    Cardiovascular  Right Left  Peripheral Pulses: normal normal  Carotid Pulses:  normal  Respiratory Lungs: non-labored  Neurological Orientation: normal Recent and Remote Memory: normal Attention Span and Concentration:   normal Language: normal Fund of Knowledge: normal  Right Left Sensation: normal normal Upper Extremity Coordination: normal normal  Lower Extremity Coordination: normal normal  Musculoskeletal Gait and Station: normal  Right Left Upper Extremity Muscle Tone:  normal  Lower Extremity Muscle Tone: normal    Motor Strength . Any abnormal findings will be noted below.   Right Left Deltoid: 5/5 5/5 Biceps: 5/5 5/5 Triceps: 5/5 5/5 Wrist Extensor: 4+/5 4+/5 Grip: 4-/5 4-/5 Finger Extensor: 5/5 5/5 Hip Flexor: 5/5 5/5 Knee Extensor: 5/5 5/5 Knee Flexor: 5/5 5/5 Tib Anterior: 3/5 5/5 EHL: 1/5 5/5 Medial Gastroc: 4/5 5/5  Gaze Normal Horizontal Gaze Stability: normal  Deep Tendon Reflexes  Right Left Biceps: 2+ 2+ Brachioradialis: 2+ 2+ Patellar: 2+ 2+ Achilles: 2+ 2+  Sensory Sensation was tested at C2 to T1 and L1 to S1.   Cranial Nerves II. Optic Nerve/Visual Fields:  normal III. Oculomotor: EOMI IV. Trochlear: EOMI V. Trigeminal: sensory intact VI. Abducens: EOMI VII. Facial: no facial droop VIII. Acoustic/Vestibular: hearing intact IX. Glossopharyngeal: palate elevation symmetric X. Vagus: no hoarseness XI. Spinal Accessory: shoulder shrug full XII. Hypoglossal: tongue protrusion midline   Additional Findings:  L hand intrinsic muscle wasting. 4/5 hand grip bilaterally. UEs 4+ reflexes, bilateral Hoffman's 3/5 R DF, 4/5 R PF, 4+ L DF and PF   DIAGNOSTIC RESULTS:  MRI cervical  spine without contrast performed April 21, 2021 was reviewed.  This showed severe stenosis at C3-4 with cord signal change and myelomalacia from broad disc osteophyte complex.  There is significant disc space height loss at this level.  At C4-5, there is moderate to severe stenosis with cord flattening, though no impingement due to the myelomalacia reducing the caliber of the cord.  There is some diffuse osteopenia seen on x-ray.  MRI lumbar spine without contrast from September 6 was reviewed.  There is L4-5 grade 2 spondylolisthesis with severe stenosis, with movement on flexion and extension x-rays.  There is moderate stenosis at L3-4 from broad disc bulge and at L2-3 to a lesser extent    IMPRESSION:  This is a 75 year old man with severe cervical stenosis with progressive myelopathy and myelomalacia.  Additionally, he has severe lumbar stenosis with grade 2 spondylolisthesis at L4-5 with associated right foot weakness and neurogenic claudication.  PLAN: I had a long discussion with the patient.  Given the progressive decline as well as the severity of the stenosis in his cervical spine, I recommend a C3-4 and C4-5 ACDF to give him the best chance of recovery and to halt further progression of his myelopathy.  Risks, benefits, alternatives, expected convalescence were discussed with him.  The general technique was discussed with him.  Risks discussed included, were not limited to, bleeding, pain, infection, scar, stroke, damage to nearby organs, neurologic deficit, spinal fluid leak, pseudoarthrosis, adjacent segment disease, and death.  He wished to proceed and informed consent was obtained.  Additionally, with the severity of his lumbar spine disease, he likely would require surgery for this as well but as this would likely require a L4-5 TLIF, I would like him to first recover from his ACDF prior to addressing this.  All questions and concerns were answered.  He verbalized understanding and  agreement with the plan.

## 2021-05-06 NOTE — Anesthesia Preprocedure Evaluation (Addendum)
Anesthesia Evaluation  Patient identified by MRN, date of birth, ID band Patient awake    Reviewed: Allergy & Precautions, H&P , NPO status , Patient's Chart, lab work & pertinent test results  Airway Mallampati: II  TM Distance: >3 FB Neck ROM: limited    Dental  (+) Poor Dentition, Dental Advisory Given   Pulmonary neg pulmonary ROS,    breath sounds clear to auscultation       Cardiovascular negative cardio ROS   Rhythm:regular Rate:Normal     Neuro/Psych    GI/Hepatic GERD  ,  Endo/Other  Morbid obesity  Renal/GU      Musculoskeletal  (+) Arthritis ,   Abdominal   Peds  Hematology   Anesthesia Other Findings   Reproductive/Obstetrics                            Anesthesia Physical Anesthesia Plan  ASA: 2  Anesthesia Plan: General   Post-op Pain Management:    Induction: Intravenous  PONV Risk Score and Plan: Ondansetron, Dexamethasone, Midazolam and Treatment may vary due to age or medical condition  Airway Management Planned: Oral ETT and Video Laryngoscope Planned  Additional Equipment:   Intra-op Plan:   Post-operative Plan: Extubation in OR  Informed Consent: I have reviewed the patients History and Physical, chart, labs and discussed the procedure including the risks, benefits and alternatives for the proposed anesthesia with the patient or authorized representative who has indicated his/her understanding and acceptance.     Dental advisory given  Plan Discussed with: CRNA, Anesthesiologist and Surgeon  Anesthesia Plan Comments:         Anesthesia Quick Evaluation

## 2021-05-07 ENCOUNTER — Encounter (HOSPITAL_COMMUNITY): Payer: Self-pay | Admitting: Neurosurgery

## 2021-05-07 DIAGNOSIS — M4802 Spinal stenosis, cervical region: Secondary | ICD-10-CM | POA: Diagnosis not present

## 2021-05-07 DIAGNOSIS — M4312 Spondylolisthesis, cervical region: Secondary | ICD-10-CM | POA: Diagnosis not present

## 2021-05-07 MED ORDER — OXYCODONE-ACETAMINOPHEN 5-325 MG PO TABS: 1.0000 | ORAL_TABLET | ORAL | 0 refills | Status: DC | PRN

## 2021-05-07 MED ORDER — METHOCARBAMOL 500 MG PO TABS: 500.0000 mg | ORAL_TABLET | Freq: Four times a day (QID) | ORAL | 0 refills | Status: DC | PRN

## 2021-05-07 MED ORDER — DOCUSATE SODIUM 100 MG PO CAPS: 100.0000 mg | ORAL_CAPSULE | Freq: Two times a day (BID) | ORAL | 2 refills | Status: DC

## 2021-05-07 NOTE — TOC Initial Note (Signed)
Transition of Care [facility]) - Initial/Assessment Note    Patient Details  Name: Derek [name] MRN: [id number] Date of Birth: 29-Nov-1945  [facility]) CM/SW Contact:    Joanne Chars, LCSW Phone Number: 05/07/2021, 10:17 AM  Clinical Narrative:       CSW met with pt regarding recommendation for [facility].  Pt agreeable, choice document given, no agency preference indicated.  Discussed [facility] as in network with [employer].  Permission given to speak with ex wife or adult children if needed. PCP in place.  Pt is vaccinated for covid with one booster.    Tommi Rumps at [place] accepts referral.             Expected Discharge Plan: [place] Barriers to Discharge: No Barriers Identified   Patient Goals and CMS Choice Patient states their goals for this hospitalization and ongoing recovery are:: be able to walk the track again CMS Medicare.gov Compare Post Acute Care list provided to:: Patient Choice offered to / list presented to : Patient  Expected Discharge Plan and Services Expected Discharge Plan: [place] Choice: [place] arrangements for the past 2 months: Single Family Home Expected Discharge Date: 05/07/21               DME Arranged: N/A (DME through [facility] staff)         [place] Arranged: PT, OT [place] Agency: [place] Date [place]: 05/07/21 Time [place]: [id number] Representative spoke with at [place]: Tommi Rumps  Prior Living Arrangements/Services Living arrangements for the past 2 months: [place] with:: Self (currently has friend staying with him to assist after surgery) Patient language and need for interpreter reviewed:: Yes Do you feel safe going back to the place where you live?: Yes      Need for Family Participation in Patient Care: No (Comment) Care giver support system in place?: Yes (comment)   Criminal Activity/Legal Involvement Pertinent to Current  Situation/Hospitalization: No - Comment as needed  Activities of Daily Living      Permission Sought/Granted Permission sought to share information with : Family Supports Permission granted to share information with : Yes, Verbal Permission Granted  Share Information with NAME: Stanton Kidney, ex wife, or either of his children [place] or [place] granted to share info w AGENCY: [place]        Emotional Assessment Appearance:: Appears stated age Attitude/Demeanor/Rapport: Engaged Affect (typically observed): Appropriate, Pleasant Orientation: : Oriented to Place, Oriented to Self, Oriented to  Time, Oriented to Situation Alcohol / Substance Use: Not Applicable Psych Involvement: No (comment)  Admission diagnosis:  Stenosis of cervical spine with myelopathy (HCC) [J88.32, G99.2] Patient Active Problem List   Diagnosis Date Noted   Stenosis of cervical spine with myelopathy ([place]) 05/06/2021   Status post total left knee replacement 07/22/2020   Unilateral primary osteoarthritis, left knee 03/24/2020   Knee pain, chronic 06/15/2016   GERD (gastroesophageal reflux disease)    Morbid obesity ([place])    Urethritis 03/20/2013   PCP:  Dettinger, Fransisca Kaufmann, MD Pharmacy:   [facility] [address], [place] [address] [place] [address] Phone: [phone] Fax: [phone]     Social Determinants of Health (SDOH) Interventions    Readmission Risk Interventions No flowsheet data found.

## 2021-05-07 NOTE — Discharge Instructions (Addendum)
Restart aspirin on 05/09/21  Wound Care  Keep the incision clean and dry remove the outer dressing in 2 days, leave the Steri-Strips intact.  Do not put any creams, lotions, or ointments on incision.  Activity Walk each and every day, increasing distance each day. No lifting greater than 5 lbs.  Avoid excessive neck motion. No lifting no bending no twisting no driving or riding a car unless coming back and forth to see me. Wear neck brace at all times except when showering.   Diet Resume your normal diet.   Return to Work Will be discussed at you follow up appointment.  Call Your Doctor If Any of These Occur Redness, drainage, or swelling at the wound.  Temperature greater than 101 degrees. Severe pain not relieved by pain medication. Incision starts to come apart.  Follow Up Appt Call today for appointment in 1-2 weeks ([phone]) or for problems.  If you have any hardware placed in your spine, you will need an x-ray before your appointment.

## 2021-05-07 NOTE — Evaluation (Signed)
Physical Therapy Evaluation  Patient Details Name: Derek [name] MRN: [id number] DOB: 07-05-1946 Today's Date: 05/07/2021  History of Present Illness  Pt is 75 year old man with severe cervical stenosis with progressive myelopathy and myelomalacia.  PMH includes GERD, L TKA and arthritis.  S/p C3-4 and C4-5 ACDF.  Clinical Impression  Pt admitted with above diagnosis. At the time of PT eval, pt was able to demonstrate transfers and ambulation with gross min guard assist and RW for support. Pt was educated on precautions, brace application/wearing schedule, appropriate activity progression, and car transfer. Pt currently with functional limitations due to the deficits listed below (see PT Problem List). Pt will benefit from skilled PT to increase their independence and safety with mobility to allow discharge to the venue listed below.         Recommendations for follow up therapy are one component of a multi-disciplinary discharge planning process, led by the attending physician.  Recommendations may be updated based on patient status, additional functional criteria and insurance authorization.  Follow Up Recommendations Outpatient PT;Supervision for mobility/OOB (MD to order when appropriate per post op-protocol.)    Equipment Recommendations  None recommended by PT    Recommendations for Other Services       Precautions / Restrictions Precautions Precautions: Cervical Precaution Booklet Issued: Yes (comment) Precaution Comments: Reviewed Required Braces or Orthoses: Cervical Brace Cervical Brace: Soft collar;For comfort Restrictions Weight Bearing Restrictions: No Other Position/Activity Restrictions: Body habitus      Mobility  Bed Mobility Overal bed mobility: Needs Assistance Bed Mobility: Sidelying to Sit;Sit to Sidelying    General bed mobility comments: Pt was received exiting bathroom with OT    Transfers Overall transfer level: Needs assistance Equipment used:  Rolling walker (2 wheeled) Transfers: Sit to/from Stand Sit to Stand: Supervision Stand pivot transfers: Min guard       General transfer comment: VC's for improved posture and hand placement on seated surface for safety.  Ambulation/Gait Ambulation/Gait assistance: Min guard Gait Distance (Feet): 25 Feet Assistive device: Rolling walker (2 wheeled) Gait Pattern/deviations: Decreased stride length;Shuffle;Trunk flexed Gait velocity: Decreased Gait velocity interpretation: <1.31 ft/sec, indicative of household ambulator General Gait Details: Decreased floor clearance and shuffling his feet along.  Stairs Stairs: Yes Stairs assistance: Min guard Stair Management: Two rails;Step to pattern;Forwards Number of Stairs: 1 (x2) General stair comments: VC's for sequencing and general safety. Practiced 1 step x2.  Wheelchair Mobility    Modified Rankin (Stroke Patients Only)       Balance Overall balance assessment: Needs assistance Sitting-balance support: Feet supported Sitting balance-Leahy Scale: Good     Standing balance support: Bilateral upper extremity supported Standing balance-Leahy Scale: Poor Standing balance comment: reliant on RW and external support.  Difficulty with pant management and taking hands off the RW                             Pertinent Vitals/Pain Pain Assessment: Faces Pain Score: 3  Faces Pain Scale: Hurts a little bit Pain Location: soreness to incision Pain Descriptors / Indicators: Tender Pain Intervention(s): Limited activity within patient's tolerance;Monitored during session;Repositioned    Home Living Family/patient expects to be discharged to:: Private residence Living Arrangements: Alone Available Help at Discharge: Personal care attendant;Available 24 hours/day Type of Home: House Home Access: Stairs to enter Entrance Stairs-Rails: None Entrance Stairs-Number of Steps: 2 Home Layout: One level Home Equipment: Investment banker, corporate - 2 wheels;Hand held shower head;Wheelchair - manual  Prior Function Level of Independence: Needs assistance   Gait / Transfers Assistance Needed: Walks short distances with RW, and uses wheelchair for longer.  ADL's / Homemaking Assistance Needed: Has A PCA for dressing, home management, community mobility, meals.  Comments: was using RW for ambulation     Hand Dominance   Dominant Hand: Right    Extremity/Trunk Assessment   Upper Extremity Assessment Upper Extremity Assessment: Defer to OT evaluation    Lower Extremity Assessment Lower Extremity Assessment: RLE deficits/detail RLE Deficits / Details: Decreased strength and muscular endurance bilaterally, however increased foot drop on the R.    Cervical / Trunk Assessment Cervical / Trunk Assessment: Other exceptions Cervical / Trunk Exceptions: C spine surgery  Communication   Communication: No difficulties  Cognition Arousal/Alertness: Awake/alert Behavior During Therapy: WFL for tasks assessed/performed Overall Cognitive Status: Within Functional Limits for tasks assessed                                        General Comments      Exercises     Assessment/Plan    PT Assessment Patient needs continued PT services  PT Problem List Decreased strength;Decreased activity tolerance;Decreased balance;Decreased mobility;Decreased knowledge of use of DME;Decreased safety awareness;Decreased knowledge of precautions;Pain       PT Treatment Interventions DME instruction;Gait training;Stair training;Functional mobility training;Therapeutic activities;Therapeutic exercise;Neuromuscular re-education;Patient/family education    PT Goals (Current goals can be found in the Care Plan section)  Acute Rehab PT Goals Patient Stated Goal: Return home to recover PT Goal Formulation: With patient Time For Goal Achievement: 05/14/21 Potential to Achieve Goals: Good    Frequency Min 5X/week    Barriers to discharge        Co-evaluation               AM-PAC PT "6 Clicks" Mobility  Outcome Measure Help needed turning from your back to your side while in a flat bed without using bedrails?: None Help needed moving from lying on your back to sitting on the side of a flat bed without using bedrails?: A Little Help needed moving to and from a bed to a chair (including a wheelchair)?: A Little Help needed standing up from a chair using your arms (e.g., wheelchair or bedside chair)?: A Little Help needed to walk in hospital room?: A Little Help needed climbing 3-5 steps with a railing? : A Little 6 Click Score: 19    End of Session Equipment Utilized During Treatment: Gait belt Activity Tolerance: Patient tolerated treatment well Patient left: with call bell/phone within reach;in chair Nurse Communication: Mobility status PT Visit Diagnosis: Unsteadiness on feet (R26.81);Pain Pain - part of body:  (back)    Time: 0086-7619 PT Time Calculation (min) (ACUTE ONLY): 18 min   Charges:   PT Evaluation $PT Eval Low Complexity: 1 Low          Conni Slipper, PT, DPT Acute Rehabilitation Services Pager: [phone] Office: [phone]   Marylynn Pearson 05/07/2021, 10:16 AM

## 2021-05-07 NOTE — Discharge Summary (Signed)
  Physician Discharge Summary  Patient ID: Derek [name] MRN: [id number] DOB/AGE: 11/10/1945 75 y.o.  Admit date: 05/06/2021 Discharge date: 05/07/2021  Admission Diagnoses:  Cervical stenosis with myelopathy  Discharge Diagnoses:  Same Active Problems:   Stenosis of cervical spine with myelopathy [facility])   Discharged Condition: Stable  Hospital Course:  Derek [name] is a 75 y.o. male with severe myelopathy from cervical stenosis who underwent elective C3-4, C4-5 ACDFs on 9/23.  Postoperatively, he demonstrated remarkable improvement in his preoperative ambulatory difficulties and weakness.  He was tolerating a regular diet and his pain was well-controlled.  PT was consulted and home therapies were recommended.  Patient initially refused home services but I was able to convince him of their necessity for his recovery.  He was deemed ready for discharge home on 9/22.  Treatments: Surgery - C3-4, C4-5 ACDFs   Discharge Exam: Blood pressure 112/72, pulse 91, temperature 98.1 F (36.7 C), temperature source Oral, resp. rate 17, height 5\' 6"  (1.676 m), weight 114.5 kg, SpO2 95 %. Awake, alert, oriented Speech fluent, appropriate CN grossly intact 5/5 BUE/BLE.  + Hyperreflexia, Hoffmans Wound dressing c/d/i  Disposition: Discharge disposition: 01-Home or Self Care       Discharge Instructions     Incentive spirometry RT   Complete by: As directed       Allergies as of 05/07/2021   No Known Allergies      Medication List     TAKE these medications    aspirin 325 MG EC tablet Take 325 mg by mouth daily as needed for pain.   cetirizine 10 MG tablet Commonly known as: ZYRTEC Take 10 mg by mouth daily.   cholecalciferol 1000 units tablet Commonly known as: VITAMIN D Take 1,000 Units by mouth daily.   docusate sodium 100 MG capsule Commonly known as: COLACE Take 1 capsule (100 mg total) by mouth 2 (two) times daily.   methocarbamol 500 MG tablet Commonly known  as: ROBAXIN Take 1 tablet (500 mg total) by mouth every 6 (six) hours as needed for muscle spasms.   omeprazole 20 MG capsule Commonly known as: PRILOSEC Take 20 mg by mouth daily before breakfast.   oxyCODONE-acetaminophen 5-325 MG tablet Commonly known as: Percocet Take 1 tablet by mouth every 4 (four) hours as needed for severe pain.   vitamin B-12 1000 MCG tablet Commonly known as: CYANOCOBALAMIN Take 1,000 mcg by mouth daily.         Signed: 05/09/2021 05/07/2021, 9:28 AM

## 2021-05-07 NOTE — Evaluation (Signed)
Occupational Therapy Evaluation Patient Details Name: Derek [name] MRN: [id number] DOB: [birth year] Today's Date: 05/07/2021   History of Present Illness 75 year old man with severe cervical stenosis with progressive myelopathy and myelomalacia.  PMH includes GERD, L TKA and arthritis.  S/p C3-4 and C4-5 ACDF.   Clinical Impression   Patient admitted for the diagnosis and procedure above.  PTA he has a PCA that assists with ADL/IADL, and is with him during mobility at [facility] level.  He uses the wheelchair for longer distances.  Deficits impacting independence are listed below.  Currently he is functioning close to his baseline for ADL completion at sit/stand level, and for in room mobility/toileting.  Cervical precautions reviewed and patient verbalizes understanding.   Patient could benefit from [facility] therapies to work on strength and overall independence, if he wishes.       Recommendations for follow up therapy are one component of a multi-disciplinary discharge planning process, led by the attending physician.  Recommendations may be updated based on patient status, additional functional criteria and insurance authorization.   Follow Up Recommendations  HH if the patient agrees.     Equipment Recommendations  None recommended by OT    Recommendations for Other Services       Precautions / Restrictions Precautions Precautions: Cervical Precaution Booklet Issued: Yes (comment) Precaution Comments: Reviewed Required Braces or Orthoses: Cervical Brace Cervical Brace: Soft collar;For comfort Restrictions Weight Bearing Restrictions: No Other Position/Activity Restrictions: Body habitus      Mobility Bed Mobility Overal bed mobility: Needs Assistance Bed Mobility: Sidelying to Sit;Sit to Sidelying   Sidelying to sit: Supervision     Sit to sidelying: Min assist General bed mobility comments: assist to bring feet onto the bed.    Transfers Overall transfer level: Needs  assistance Equipment used: Rolling walker (2 wheeled) Transfers: Sit to/from [employer] Sit to Stand: Supervision Stand pivot transfers: Min guard            Balance Overall balance assessment: Needs assistance Sitting-balance support: Feet supported Sitting balance-Leahy Scale: Good     Standing balance support: Bilateral upper extremity supported Standing balance-Leahy Scale: Poor Standing balance comment: reliant on RW and external support.  Difficulty with pant management and taking hands off the RW                           ADL either performed or assessed with clinical judgement   ADL Overall ADL's : At baseline                                       General ADL Comments: Patient needs Min a for pants, and closer to Mod A for shoes and socks.  Min Guard for standing Runner, broadcasting/film/video.     Vision Patient Visual Report: No change from baseline       Perception     Praxis      Pertinent Vitals/Pain Pain Assessment: 0-10 Pain Score: 3  Pain Location: soreness to incision Pain Descriptors / Indicators: Tender Pain Intervention(s): Monitored during session     Hand Dominance Right   Extremity/Trunk Assessment Upper Extremity Assessment Upper Extremity Assessment: Generalized weakness (B UE weakness to grip, R worse than L and B shoulder complex.)       Cervical / Trunk Assessment Cervical / Trunk Assessment: Other exceptions Cervical / Trunk Exceptions: C spine surgery  Communication Communication Communication: No difficulties   Cognition Arousal/Alertness: Awake/alert Behavior During Therapy: WFL for tasks assessed/performed Overall Cognitive Status: Within Functional Limits for tasks assessed                                                      Home Living Family/patient expects to be discharged to:: Private residence Living Arrangements: Alone Available Help at Discharge: Personal  care attendant;Available 24 hours/day Type of Home: House Home Access: Stairs to enter [employer] of Steps: 2 Entrance Stairs-Rails: None Home Layout: One level     Bathroom Shower/Tub: Producer, television/film/video: Standard Bathroom Accessibility: Yes How Accessible: Accessible via walker Home Equipment: Shower seat;Walker - 2 wheels;Hand held shower head;Wheelchair - manual          Prior Functioning/Environment Level of Independence: Needs assistance  Gait / Transfers Assistance Needed: Walks short distances with RW, and uses wheelchair for longer. ADL's / Homemaking Assistance Needed: Has A PCA for dressing, home management, community mobility, meals.            OT Problem List: Decreased strength;Decreased range of motion;Decreased activity tolerance;Impaired balance (sitting and/or standing)      OT Treatment/Interventions:      OT Goals(Current goals can be found in the care plan section) Acute Rehab OT Goals Patient Stated Goal: Return home to recover OT Goal Formulation: With patient Time For Goal Achievement: 05/07/21 Potential to Achieve Goals: Good  OT Frequency:     Barriers to D/C:  None noted          Co-evaluation              AM-PAC OT "6 Clicks" Daily Activity     Outcome Measure Help from another person eating meals?: None Help from another person taking care of personal grooming?: None Help from another person toileting, which includes using toliet, bedpan, or urinal?: A Little Help from another person bathing (including washing, rinsing, drying)?: A Little Help from another person to put on and taking off regular upper body clothing?: None Help from another person to put on and taking off regular lower body clothing?: A Little 6 Click Score: 21   End of Session Equipment Utilized During Treatment: Rolling walker Nurse Communication: Mobility status  Activity Tolerance: Patient tolerated treatment well Patient left:  in chair;with call bell/phone within reach  OT Visit Diagnosis: Unsteadiness on feet (R26.81);Muscle weakness (generalized) (M62.81)                Time: 5809-9833 OT Time Calculation (min): 24 min Charges:  OT General Charges $OT Visit: 1 Visit OT Evaluation $OT Eval Moderate Complexity: 1 Mod OT Treatments $Self Care/Home Management : 8-22 mins  05/07/2021  RP, OTR/L  Acute Rehabilitation Services  Office:  [phone]   Suzanna Obey 05/07/2021, 8:43 AM

## 2021-05-07 NOTE — Progress Notes (Signed)
Patient was transported via wheelchair by volunteer for discharge home; in no acute distress nor complaints of pain nor discomfort; incision on his anterior neck with honeycomb dressing is clean, dry and intact; wears a soft collar on; room was checked and accounted for all his belongings; discharge instructions concerning wound care, medications, appointments, when to call the doctor were all discussed with the patient by RN and he verbalized understanding on the instructions given.

## 2021-05-07 NOTE — Anesthesia Postprocedure Evaluation (Signed)
Anesthesia Post Note  Patient: Derek [name]  Procedure(s) Performed: Anterior Cervical Discectomy Fusion Cervical Three-Four, Cervical Four-Five     Patient location during evaluation: PACU Anesthesia Type: General Level of consciousness: awake and alert Pain management: pain level controlled Vital Signs Assessment: post-procedure vital signs reviewed and stable Respiratory status: spontaneous breathing, nonlabored ventilation, respiratory function stable and patient connected to nasal cannula oxygen Cardiovascular status: blood pressure returned to baseline and stable Postop Assessment: no apparent nausea or vomiting Anesthetic complications: no   No notable events documented.  Last Vitals:  Vitals:   05/07/21 0345 05/07/21 0719  BP: 136/72 112/72  Pulse: 84 91  Resp: 20 17  Temp: 36.6 C 36.7 C  SpO2: 96% 95%    Last Pain:  Vitals:   05/07/21 0727  TempSrc:   PainSc: 2                  Shakil Dirk S

## 2021-05-07 NOTE — Plan of Care (Signed)
  Problem: Education: Goal: Ability to verbalize activity precautions or restrictions will improve Outcome: Completed/Met Goal: Knowledge of the prescribed therapeutic regimen will improve Outcome: Completed/Met Goal: Understanding of discharge needs will improve Outcome: Completed/Met   Problem: Activity: Goal: Ability to avoid complications of mobility impairment will improve Outcome: Completed/Met Goal: Ability to tolerate increased activity will improve Outcome: Completed/Met Goal: Will remain free from falls Outcome: Completed/Met   Problem: Bowel/Gastric: Goal: Gastrointestinal status for postoperative course will improve Outcome: Completed/Met   Problem: Clinical Measurements: Goal: Ability to maintain clinical measurements within normal limits will improve Outcome: Completed/Met Goal: Postoperative complications will be avoided or minimized Outcome: Completed/Met Goal: Diagnostic test results will improve Outcome: Completed/Met   Problem: Pain Management: Goal: Pain level will decrease Outcome: Completed/Met   Problem: Skin Integrity: Goal: Will show signs of wound healing Outcome: Completed/Met   Problem: Health Behavior/Discharge Planning: Goal: Identification of resources available to assist in meeting health care needs will improve Outcome: Completed/Met   Problem: Bladder/Genitourinary: Goal: Urinary functional status for postoperative course will improve Outcome: Completed/Met   Problem: Safety: Goal: Ability to remain free from injury will improve Outcome: Completed/Met

## 2021-05-11 DIAGNOSIS — Z7982 Long term (current) use of aspirin: Secondary | ICD-10-CM | POA: Diagnosis not present

## 2021-05-11 DIAGNOSIS — Z79891 Long term (current) use of opiate analgesic: Secondary | ICD-10-CM | POA: Diagnosis not present

## 2021-05-11 DIAGNOSIS — M1990 Unspecified osteoarthritis, unspecified site: Secondary | ICD-10-CM | POA: Diagnosis not present

## 2021-05-11 DIAGNOSIS — M4802 Spinal stenosis, cervical region: Secondary | ICD-10-CM | POA: Diagnosis not present

## 2021-05-11 DIAGNOSIS — G959 Disease of spinal cord, unspecified: Secondary | ICD-10-CM | POA: Diagnosis not present

## 2021-05-11 DIAGNOSIS — K219 Gastro-esophageal reflux disease without esophagitis: Secondary | ICD-10-CM | POA: Diagnosis not present

## 2021-05-11 DIAGNOSIS — Z981 Arthrodesis status: Secondary | ICD-10-CM | POA: Diagnosis not present

## 2021-05-11 DIAGNOSIS — Z4789 Encounter for other orthopedic aftercare: Secondary | ICD-10-CM | POA: Diagnosis not present

## 2021-05-11 DIAGNOSIS — I10 Essential (primary) hypertension: Secondary | ICD-10-CM | POA: Diagnosis not present

## 2021-05-13 DIAGNOSIS — I10 Essential (primary) hypertension: Secondary | ICD-10-CM | POA: Diagnosis not present

## 2021-05-13 DIAGNOSIS — G959 Disease of spinal cord, unspecified: Secondary | ICD-10-CM | POA: Diagnosis not present

## 2021-05-13 DIAGNOSIS — M4802 Spinal stenosis, cervical region: Secondary | ICD-10-CM | POA: Diagnosis not present

## 2021-05-13 DIAGNOSIS — M1990 Unspecified osteoarthritis, unspecified site: Secondary | ICD-10-CM | POA: Diagnosis not present

## 2021-05-13 DIAGNOSIS — Z79891 Long term (current) use of opiate analgesic: Secondary | ICD-10-CM | POA: Diagnosis not present

## 2021-05-13 DIAGNOSIS — K219 Gastro-esophageal reflux disease without esophagitis: Secondary | ICD-10-CM | POA: Diagnosis not present

## 2021-05-13 DIAGNOSIS — Z4789 Encounter for other orthopedic aftercare: Secondary | ICD-10-CM | POA: Diagnosis not present

## 2021-05-13 DIAGNOSIS — Z981 Arthrodesis status: Secondary | ICD-10-CM | POA: Diagnosis not present

## 2021-05-13 DIAGNOSIS — Z7982 Long term (current) use of aspirin: Secondary | ICD-10-CM | POA: Diagnosis not present

## 2021-05-18 ENCOUNTER — Telehealth: Payer: Self-pay

## 2021-05-18 NOTE — Telephone Encounter (Signed)
Transition Care Management Follow-up Telephone Call Date of discharge and from where: 05/07/2021  [facility] How have you been since you were released from the hospital? " So far so good" Any questions or concerns? No  Items Reviewed: Did the pt receive and understand the discharge instructions provided? Yes  Medications obtained and verified? Yes  Other? No  Any new allergies since your discharge? No  Dietary orders reviewed? Yes Do you have support at home? Yes   Home Care and Equipment/Supplies: Were home health services ordered? not applicable If so, what is the name of the agency?  Has the agency set up a time to come to the patient's home?  Were any new equipment or medical supplies ordered?   What is the name of the medical supply agency?  Were you able to get the supplies/equipment?  Do you have any questions related to the use of the equipment or supplies?   Functional Questionnaire: (I = Independent and D = Dependent) ADLs: I  Bathing/Dressing- I  Meal Prep- I  Eating- I  Maintaining continence- I  Transferring/Ambulation- I  Managing Meds- I  Follow up appointments reviewed:  PCP Hospital f/u appt confirmed? No  Specialist Hospital f/u appt confirmed? Yes  Are transportation arrangements needed?  If their condition worsens, is the pt aware to call PCP or go to the Emergency Dept.? YES Was the patient provided with contact information for the PCP's office or ED? YES Was to pt encouraged to call back with questions or concerns? Yes  Rowe Pavy, RN, BSN, CEN [facility] [employer] [phone]

## 2021-05-25 DIAGNOSIS — M1712 Unilateral primary osteoarthritis, left knee: Secondary | ICD-10-CM | POA: Diagnosis not present

## 2021-05-25 DIAGNOSIS — Z96652 Presence of left artificial knee joint: Secondary | ICD-10-CM | POA: Diagnosis not present

## 2021-05-27 DIAGNOSIS — Z7982 Long term (current) use of aspirin: Secondary | ICD-10-CM | POA: Diagnosis not present

## 2021-05-27 DIAGNOSIS — I10 Essential (primary) hypertension: Secondary | ICD-10-CM | POA: Diagnosis not present

## 2021-05-27 DIAGNOSIS — Z79891 Long term (current) use of opiate analgesic: Secondary | ICD-10-CM | POA: Diagnosis not present

## 2021-05-27 DIAGNOSIS — M4802 Spinal stenosis, cervical region: Secondary | ICD-10-CM | POA: Diagnosis not present

## 2021-05-27 DIAGNOSIS — Z981 Arthrodesis status: Secondary | ICD-10-CM | POA: Diagnosis not present

## 2021-05-27 DIAGNOSIS — G959 Disease of spinal cord, unspecified: Secondary | ICD-10-CM | POA: Diagnosis not present

## 2021-05-27 DIAGNOSIS — K219 Gastro-esophageal reflux disease without esophagitis: Secondary | ICD-10-CM | POA: Diagnosis not present

## 2021-05-27 DIAGNOSIS — Z4789 Encounter for other orthopedic aftercare: Secondary | ICD-10-CM | POA: Diagnosis not present

## 2021-05-27 DIAGNOSIS — M1990 Unspecified osteoarthritis, unspecified site: Secondary | ICD-10-CM | POA: Diagnosis not present

## 2021-06-01 DIAGNOSIS — M1990 Unspecified osteoarthritis, unspecified site: Secondary | ICD-10-CM | POA: Diagnosis not present

## 2021-06-01 DIAGNOSIS — Z79891 Long term (current) use of opiate analgesic: Secondary | ICD-10-CM | POA: Diagnosis not present

## 2021-06-01 DIAGNOSIS — K219 Gastro-esophageal reflux disease without esophagitis: Secondary | ICD-10-CM | POA: Diagnosis not present

## 2021-06-01 DIAGNOSIS — G959 Disease of spinal cord, unspecified: Secondary | ICD-10-CM | POA: Diagnosis not present

## 2021-06-01 DIAGNOSIS — Z981 Arthrodesis status: Secondary | ICD-10-CM | POA: Diagnosis not present

## 2021-06-01 DIAGNOSIS — Z4789 Encounter for other orthopedic aftercare: Secondary | ICD-10-CM | POA: Diagnosis not present

## 2021-06-01 DIAGNOSIS — M4802 Spinal stenosis, cervical region: Secondary | ICD-10-CM | POA: Diagnosis not present

## 2021-06-01 DIAGNOSIS — Z7982 Long term (current) use of aspirin: Secondary | ICD-10-CM | POA: Diagnosis not present

## 2021-06-01 DIAGNOSIS — I10 Essential (primary) hypertension: Secondary | ICD-10-CM | POA: Diagnosis not present

## 2021-06-25 DIAGNOSIS — Z96652 Presence of left artificial knee joint: Secondary | ICD-10-CM | POA: Diagnosis not present

## 2021-06-25 DIAGNOSIS — M1712 Unilateral primary osteoarthritis, left knee: Secondary | ICD-10-CM | POA: Diagnosis not present

## 2021-07-15 DIAGNOSIS — Z6841 Body Mass Index (BMI) 40.0 and over, adult: Secondary | ICD-10-CM | POA: Diagnosis not present

## 2021-07-15 DIAGNOSIS — M4316 Spondylolisthesis, lumbar region: Secondary | ICD-10-CM | POA: Diagnosis not present

## 2021-07-15 DIAGNOSIS — M4712 Other spondylosis with myelopathy, cervical region: Secondary | ICD-10-CM | POA: Diagnosis not present

## 2021-08-27 ENCOUNTER — Encounter: Payer: Self-pay | Admitting: Family Medicine

## 2021-08-27 ENCOUNTER — Ambulatory Visit (INDEPENDENT_AMBULATORY_CARE_PROVIDER_SITE_OTHER): Payer: Medicare HMO | Admitting: Family Medicine

## 2021-08-27 VITALS — BP 123/75 | HR 63 | Ht 66.0 in | Wt 250.0 lb

## 2021-08-27 DIAGNOSIS — Z01818 Encounter for other preprocedural examination: Secondary | ICD-10-CM

## 2021-08-27 LAB — CMP14+EGFR
ALT: 16 IU/L (ref 0–44)
AST: 18 IU/L (ref 0–40)
Albumin/Globulin Ratio: 0.8 — ABNORMAL LOW (ref 1.2–2.2)
Albumin: 3.7 g/dL (ref 3.7–4.7)
Alkaline Phosphatase: 84 IU/L (ref 44–121)
BUN/Creatinine Ratio: 13 (ref 10–24)
BUN: 14 mg/dL (ref 8–27)
Bilirubin Total: 0.4 mg/dL (ref 0.0–1.2)
CO2: 24 mmol/L (ref 20–29)
Calcium: 10.2 mg/dL (ref 8.6–10.2)
Chloride: 99 mmol/L (ref 96–106)
Creatinine, Ser: 1.05 mg/dL (ref 0.76–1.27)
Globulin, Total: 4.5 g/dL (ref 1.5–4.5)
Glucose: 99 mg/dL (ref 70–99)
Potassium: 4.9 mmol/L (ref 3.5–5.2)
Sodium: 138 mmol/L (ref 134–144)
Total Protein: 8.2 g/dL (ref 6.0–8.5)
eGFR: 74 mL/min/{1.73_m2} (ref >59)

## 2021-08-27 LAB — CBC WITH DIFFERENTIAL/PLATELET
Basophils Absolute: 0 10*3/uL (ref 0.0–0.2)
Basos: 0 %
EOS (ABSOLUTE): 0.1 10*3/uL (ref 0.0–0.4)
Eos: 1 %
Hematocrit: 36 % — ABNORMAL LOW (ref 37.5–51.0)
Hemoglobin: 11.8 g/dL — ABNORMAL LOW (ref 13.0–17.7)
Immature Grans (Abs): 0 10*3/uL (ref 0.0–0.1)
Immature Granulocytes: 0 %
Lymphocytes Absolute: 2.2 10*3/uL (ref 0.7–3.1)
Lymphs: 24 %
MCH: 32.1 pg (ref 26.6–33.0)
MCHC: 32.8 g/dL (ref 31.5–35.7)
MCV: 98 fL — ABNORMAL HIGH (ref 79–97)
Monocytes Absolute: 0.6 10*3/uL (ref 0.1–0.9)
Monocytes: 7 %
Neutrophils Absolute: 6.1 10*3/uL (ref 1.4–7.0)
Neutrophils: 68 %
Platelets: 203 10*3/uL (ref 150–450)
RBC: 3.68 x10E6/uL — ABNORMAL LOW (ref 4.14–5.80)
RDW: 12.2 % (ref 11.6–15.4)
WBC: 9.1 10*3/uL (ref 3.4–10.8)

## 2021-08-27 LAB — COAGUCHEK XS/INR WAIVED
INR: 1.1 (ref 0.9–1.1)
Prothrombin Time: 13 s

## 2021-08-27 NOTE — Progress Notes (Signed)
BP 123/75    Pulse 63    Ht 5' 6"  (1.676 m)    Wt 250 lb (113.4 kg)    SpO2 97%    BMI 40.35 kg/m    Subjective:   Patient ID: Derek [name], male    DOB: 31-May-1946, 77 y.o.   MRN: [id number]  HPI: Derek [name] is a 76 y.o. male presenting on 08/27/2021 for Surgical Clearance (Back)   HPI Preoperative clearance Patient is coming in for preoperative clearance.  He has been more wheelchair-bound in the past few years.  He can get up and walk a little bit but because of that his conditioning status is down and that he can walk across short distances but does get a little winded.  He denies any chest pain or chest tightness associated with it.  He has a METS of approximately 2.  Relevant past medical, surgical, family and social history reviewed and updated as indicated. Interim medical history since our last visit reviewed. Allergies and medications reviewed and updated.  Review of Systems  Constitutional:  Negative for chills and fever.  Eyes:  Negative for visual disturbance.  Respiratory:  Negative for shortness of breath and wheezing.   Cardiovascular:  Negative for chest pain and leg swelling.  Musculoskeletal:  Negative for back pain and gait problem.  Skin:  Negative for rash.  Neurological:  Negative for dizziness, weakness and light-headedness.  All other systems reviewed and are negative.  Per HPI unless specifically indicated above   Allergies as of 08/27/2021   No Known Allergies      Medication List        Accurate as of August 27, 2021 10:17 AM. If you have any questions, ask your nurse or doctor.          aspirin 325 MG EC tablet Take 325 mg by mouth daily as needed for pain.   cetirizine 10 MG tablet Commonly known as: ZYRTEC Take 10 mg by mouth daily.   cholecalciferol 1000 units tablet Commonly known as: VITAMIN D Take 1,000 Units by mouth daily.   docusate sodium 100 MG capsule Commonly known as: COLACE Take 1 capsule (100 mg total) by  mouth 2 (two) times daily.   methocarbamol 500 MG tablet Commonly known as: ROBAXIN Take 1 tablet (500 mg total) by mouth every 6 (six) hours as needed for muscle spasms.   omeprazole 20 MG capsule Commonly known as: PRILOSEC Take 20 mg by mouth daily before breakfast.   oxyCODONE-acetaminophen 5-325 MG tablet Commonly known as: Percocet Take 1 tablet by mouth every 4 (four) hours as needed for severe pain.   vitamin B-12 1000 MCG tablet Commonly known as: CYANOCOBALAMIN Take 1,000 mcg by mouth daily.         Objective:   BP 123/75    Pulse 63    Ht 5' 6"  (1.676 m)    Wt 250 lb (113.4 kg)    SpO2 97%    BMI 40.35 kg/m   Wt Readings from Last 3 Encounters:  08/27/21 250 lb (113.4 kg)  05/06/21 252 lb 6.4 oz (114.5 kg)  05/04/21 252 lb 6.4 oz (114.5 kg)    Physical Exam Vitals and nursing note reviewed.  Constitutional:      General: He is not in acute distress.    Appearance: He is well-developed. He is not diaphoretic.  Eyes:     General: No scleral icterus.    Conjunctiva/sclera: Conjunctivae normal.  Neck:     Thyroid:  No thyromegaly.  Cardiovascular:     Rate and Rhythm: Normal rate and regular rhythm.     Heart sounds: Normal heart sounds. No murmur heard. Pulmonary:     Effort: Pulmonary effort is normal. No respiratory distress.     Breath sounds: Normal breath sounds. No wheezing.  Musculoskeletal:        General: No swelling. Normal range of motion.     Cervical back: Neck supple.  Lymphadenopathy:     Cervical: No cervical adenopathy.  Skin:    General: Skin is warm and dry.     Findings: No rash.  Neurological:     Mental Status: He is alert and oriented to person, place, and time.     Coordination: Coordination normal.  Psychiatric:        Behavior: Behavior normal.    Ekg: Sinus Bradycardia  Assessment & Plan:   Problem List Items Addressed This Visit   None Visit Diagnoses     Preoperative clearance    -  Primary   Relevant Orders    EKG 12-Lead (Completed)   CBC with Differential/Platelet   CMP14+EGFR   CoaguChek XS/INR Waived       Patient has low to moderate risk score based on his current health age and current status.  We will do blood work prior to surgery  EKG looks good.  As long as blood work comes back good then we will clear her for surgery. Follow up plan: Return if symptoms worsen or fail to improve.  Counseling provided for all of the vaccine components Orders Placed This Encounter  Procedures   CBC with Differential/Platelet   CMP14+EGFR   CoaguChek XS/INR Waived   EKG 12-Lead    Caryl Pina, MD [facility] 08/27/2021, 10:17 AM

## 2021-09-18 ENCOUNTER — Other Ambulatory Visit: Payer: Self-pay | Admitting: Neurosurgery

## 2021-10-02 ENCOUNTER — Telehealth: Payer: Self-pay

## 2021-10-02 NOTE — Telephone Encounter (Signed)
Faxed surgical clearance and OV notes to [facility] Stalling at [phone]

## 2021-10-15 NOTE — Progress Notes (Signed)
Surgical Instructions ? ? ? Your procedure is scheduled on 10/20/21. ? Report to [facility] Main Entrance "A" at 5:30 A.M., then check in with the Admitting office. ? Call this number if you have problems the morning of surgery: ? [phone] ? ? If you have any questions prior to your surgery date call [phone]: Open Monday-Friday 8am-4pm ? ? ? Remember: ? Do not eat or drink after midnight the night before your surgery ? ? ?  ? Take these medicines the morning of surgery with A SIP OF WATER:  ?cetirizine (ZYRTEC)  ?omeprazole (PRILOSEC) ? ? ?As of today, STOP taking any Aspirin (unless otherwise instructed by your surgeon) Aleve, Naproxen, Ibuprofen, Motrin, Advil, Goody's, BC's, all herbal medications, fish oil, and all vitamins. ? ?         ?Do not wear jewelry or makeup ?Do not wear lotions, powders, perfumes/colognes, or deodorant. ?Men may shave face and neck. ?Do not bring valuables to the hospital. ?Do not wear nail polish, gel polish, artificial nails, or any other type of covering on natural nails (fingers and toes) ?If you have artificial nails or gel coating that need to be removed by a nail salon, please have this removed prior to surgery. Artificial nails or gel coating may interfere with anesthesia's ability to adequately monitor your vital signs. ? ?[facility] is not responsible for any belongings or valuables. .  ? ?Do NOT Smoke (Tobacco/Vaping)  24 hours prior to your procedure ? ?If you use a CPAP at night, you may bring your mask for your overnight stay. ?  ?Contacts, glasses, hearing aids, dentures or partials may not be worn into surgery, please bring cases for these belongings ?  ?For patients admitted to the hospital, discharge time will be determined by your treatment team. ?  ?Patients discharged the day of surgery will not be allowed to drive home, and someone needs to stay with them for 24 hours. ? ?NO VISITORS WILL BE ALLOWED IN PRE-OP WHERE PATIENTS ARE PREPPED FOR SURGERY.  ONLY 1  SUPPORT PERSON MAY BE PRESENT IN THE WAITING ROOM WHILE YOU ARE IN SURGERY.  IF YOU ARE TO BE ADMITTED, ONCE YOU ARE IN YOUR ROOM YOU WILL BE ALLOWED TWO (2) VISITORS. 1 (ONE) VISITOR MAY STAY OVERNIGHT BUT MUST ARRIVE TO THE ROOM BY 8pm.  Minor children may have two parents present. Special consideration for safety and communication needs will be reviewed on a case by case basis. ? ?Special instructions:   ? ?Oral Hygiene is also important to reduce your risk of infection.  Remember - BRUSH YOUR TEETH THE MORNING OF SURGERY WITH YOUR REGULAR TOOTHPASTE ? ? ?[facility]- Preparing For Surgery ? ?Before surgery, you can play an important role. Because skin is not sterile, your skin needs to be as free of germs as possible. You can reduce the number of germs on your skin by washing with CHG (chlorahexidine gluconate) Soap before surgery.  CHG is an antiseptic cleaner which kills germs and bonds with the skin to continue killing germs even after washing.   ? ? ?Please do not use if you have an allergy to CHG or antibacterial soaps. If your skin becomes reddened/irritated stop using the CHG.  ?Do not shave (including legs and underarms) for at least 48 hours prior to first CHG shower. It is OK to shave your face. ? ?Please follow these instructions carefully. ?  ? ? Shower the NIGHT BEFORE SURGERY and the MORNING OF SURGERY with CHG  Soap.  ? If you chose to wash your hair, wash your hair first as usual with your normal shampoo. After you shampoo, rinse your hair and body thoroughly to remove the shampoo.  Then [employer] and genitals (private parts) with your normal soap and rinse thoroughly to remove soap. ? ?After that Use CHG Soap as you would any other liquid soap. You can apply CHG directly to the skin and wash gently with a scrungie or a clean washcloth.  ? ?Apply the CHG Soap to your body ONLY FROM THE NECK DOWN.  Do not use on open wounds or open sores. Avoid contact with your eyes, ears, mouth and genitals  (private parts). Wash Face and genitals (private parts)  with your normal soap.  ? ?Wash thoroughly, paying special attention to the area where your surgery will be performed. ? ?Thoroughly rinse your body with warm water from the neck down. ? ?DO NOT shower/wash with your normal soap after using and rinsing off the CHG Soap. ? ?Pat yourself dry with a CLEAN TOWEL. ? ?Wear CLEAN PAJAMAS to bed the night before surgery ? ?Place CLEAN SHEETS on your bed the night before your surgery ? ?DO NOT SLEEP WITH PETS. ? ? ?Day of Surgery: ?Take a shower with CHG soap. ?Wear Clean/Comfortable clothing the morning of surgery ?Do not apply any deodorants/lotions.   ?Remember to brush your teeth WITH YOUR REGULAR TOOTHPASTE. ? ? ? ?COVID testing ? ?If you are going to stay overnight or be admitted after your procedure/surgery and require a pre-op COVID test, please follow these instructions after your COVID test  ? ?You are not required to quarantine however you are required to wear a well-fitting mask when you are out and around people not in your household.  If your mask becomes wet or soiled, replace with a new one. ? ?Wash your hands often with soap and water for 20 seconds or clean your hands with an alcohol-based hand sanitizer that contains at least 60% alcohol. ? ?Do not share personal items. ? ?Notify your provider: ?if you are in close contact with someone who has COVID  ?or if you develop a fever of 100.4 or greater, sneezing, cough, sore throat, shortness of breath or body aches. ? ?  ?Please read over the following fact sheets that you were given.  ? ?

## 2021-10-16 ENCOUNTER — Other Ambulatory Visit: Payer: Self-pay

## 2021-10-16 ENCOUNTER — Encounter (HOSPITAL_COMMUNITY)
Admission: RE | Admit: 2021-10-16 | Discharge: 2021-10-16 | Disposition: A | Discharge status: Home / Self Care | Payer: Medicare HMO | Source: Ambulatory Visit | Attending: Neurosurgery | Admitting: Neurosurgery

## 2021-10-16 ENCOUNTER — Encounter (HOSPITAL_COMMUNITY): Payer: Self-pay

## 2021-10-16 VITALS — BP 125/64 | HR 72 | Temp 98.7°F | Resp 18 | Ht 66.0 in | Wt 241.0 lb

## 2021-10-16 DIAGNOSIS — Z20822 Contact with and (suspected) exposure to covid-19: Secondary | ICD-10-CM | POA: Diagnosis not present

## 2021-10-16 DIAGNOSIS — Z01818 Encounter for other preprocedural examination: Secondary | ICD-10-CM

## 2021-10-16 DIAGNOSIS — Z01812 Encounter for preprocedural laboratory examination: Secondary | ICD-10-CM | POA: Diagnosis not present

## 2021-10-16 LAB — TYPE AND SCREEN
ABO/RH(D): A POS
Antibody Screen: NEGATIVE

## 2021-10-16 LAB — CBC
HCT: 33.1 % — ABNORMAL LOW (ref 39.0–52.0)
Hemoglobin: 11 g/dL — ABNORMAL LOW (ref 13.0–17.0)
MCH: 33.1 pg (ref 26.0–34.0)
MCHC: 33.2 g/dL (ref 30.0–36.0)
MCV: 99.7 fL (ref 80.0–100.0)
Platelets: 187 10*3/uL (ref 150–400)
RBC: 3.32 MIL/uL — ABNORMAL LOW (ref 4.22–5.81)
RDW: 13 % (ref 11.5–15.5)
WBC: 7.7 10*3/uL (ref 4.0–10.5)
nRBC: 0 % (ref 0.0–0.2)

## 2021-10-16 LAB — SURGICAL PCR SCREEN
MRSA, PCR: NEGATIVE
Staphylococcus aureus: POSITIVE — AB

## 2021-10-16 LAB — SARS CORONAVIRUS 2 BY RT PCR (HOSPITAL ORDER, PERFORMED IN ~~LOC~~ HOSPITAL LAB): SARS Coronavirus 2: NEGATIVE

## 2021-10-16 NOTE — Progress Notes (Signed)
PCP - Dr. Ivin Booty Dettinger ?Cardiologist - denies ? ?PPM/ICD - denies ? ? ?Chest x-ray - denies ?EKG - 08/27/21 ?Stress Test - greater than 10 years ago, results neg. Per pt ?ECHO - denies ?Cardiac Cath - denies ? ?Sleep Study - denies ? ? ?DM- denies ? ?Blood Thinner Instructions: n/a ?Aspirin Instructions: pt instructed to hold for 5 days. Last dose 3/2 ? ?ERAS Protcol - no, NPO ? ? ?COVID TEST- 10/16/21 at PAT ? ? ?Anesthesia review: no ? ?Patient denies shortness of breath, fever, cough and chest pain at PAT appointment ? ? ?All instructions explained to the patient, with a verbal understanding of the material. Patient agrees to go over the instructions while at home for a better understanding. Patient also instructed to wear a mask in public after being tested for COVID-19. The opportunity to ask questions was provided. ?  ?

## 2021-10-20 ENCOUNTER — Ambulatory Visit (HOSPITAL_COMMUNITY): Payer: Medicare HMO | Admitting: Anesthesiology

## 2021-10-20 ENCOUNTER — Other Ambulatory Visit: Payer: Self-pay

## 2021-10-20 ENCOUNTER — Observation Stay (HOSPITAL_COMMUNITY)
Admission: RE | Admit: 2021-10-20 | Discharge: 2021-10-21 | Disposition: A | Discharge status: Home Health | Payer: Medicare HMO | Attending: Neurosurgery | Admitting: Neurosurgery

## 2021-10-20 ENCOUNTER — Ambulatory Visit (HOSPITAL_BASED_OUTPATIENT_CLINIC_OR_DEPARTMENT_OTHER): Payer: Medicare HMO | Admitting: Anesthesiology

## 2021-10-20 ENCOUNTER — Ambulatory Visit (HOSPITAL_COMMUNITY)
Admission: RE | Disposition: A | Discharge status: Home Health | Payer: Self-pay | Source: Home / Self Care | Attending: Neurosurgery

## 2021-10-20 ENCOUNTER — Encounter (HOSPITAL_COMMUNITY): Payer: Self-pay

## 2021-10-20 ENCOUNTER — Ambulatory Visit (HOSPITAL_COMMUNITY): Payer: Medicare HMO

## 2021-10-20 DIAGNOSIS — M48062 Spinal stenosis, lumbar region with neurogenic claudication: Secondary | ICD-10-CM | POA: Diagnosis not present

## 2021-10-20 DIAGNOSIS — Z7982 Long term (current) use of aspirin: Secondary | ICD-10-CM | POA: Insufficient documentation

## 2021-10-20 DIAGNOSIS — M4316 Spondylolisthesis, lumbar region: Secondary | ICD-10-CM

## 2021-10-20 DIAGNOSIS — M48061 Spinal stenosis, lumbar region without neurogenic claudication: Secondary | ICD-10-CM | POA: Diagnosis present

## 2021-10-20 DIAGNOSIS — Z419 Encounter for procedure for purposes other than remedying health state, unspecified: Secondary | ICD-10-CM

## 2021-10-20 DIAGNOSIS — Z981 Arthrodesis status: Secondary | ICD-10-CM | POA: Diagnosis not present

## 2021-10-20 DIAGNOSIS — Z96652 Presence of left artificial knee joint: Secondary | ICD-10-CM | POA: Diagnosis not present

## 2021-10-20 DIAGNOSIS — M4326 Fusion of spine, lumbar region: Secondary | ICD-10-CM | POA: Diagnosis not present

## 2021-10-20 HISTORY — PX: TRANSFORAMINAL LUMBAR INTERBODY FUSION (TLIF) WITH PEDICLE SCREW FIXATION 1 LEVEL: SHX6141

## 2021-10-20 SURGERY — TRANSFORAMINAL LUMBAR INTERBODY FUSION (TLIF) WITH PEDICLE SCREW FIXATION 1 LEVEL
Anesthesia: General

## 2021-10-20 MED ORDER — FENTANYL CITRATE (PF) 100 MCG/2ML IJ SOLN: 25.0000 ug | INTRAMUSCULAR | Status: DC | PRN

## 2021-10-20 MED ORDER — LACTATED RINGERS IV SOLN: INTRAVENOUS | Status: DC | PRN

## 2021-10-20 MED ORDER — PHENYLEPHRINE HCL-NACL 20-0.9 MG/250ML-% IV SOLN
INTRAVENOUS | Status: DC | PRN
  Administered 2021-10-20: 20 ug/min via INTRAVENOUS

## 2021-10-20 MED ORDER — ONDANSETRON HCL 4 MG/2ML IJ SOLN
INTRAMUSCULAR | Status: AC
  Filled 2021-10-20: qty 2

## 2021-10-20 MED ORDER — VANCOMYCIN HCL 1000 MG IV SOLR
INTRAVENOUS | Status: AC
  Filled 2021-10-20: qty 20

## 2021-10-20 MED ORDER — SUGAMMADEX SODIUM 200 MG/2ML IV SOLN
INTRAVENOUS | Status: DC | PRN
  Administered 2021-10-20: 400 mg via INTRAVENOUS

## 2021-10-20 MED ORDER — PANTOPRAZOLE SODIUM 40 MG PO TBEC
40.0000 mg | DELAYED_RELEASE_TABLET | Freq: Every day | ORAL | Status: DC
  Administered 2021-10-21: 40 mg via ORAL
  Filled 2021-10-20: qty 1

## 2021-10-20 MED ORDER — CEFAZOLIN SODIUM-DEXTROSE 2-4 GM/100ML-% IV SOLN
INTRAVENOUS | Status: AC
  Filled 2021-10-20: qty 100

## 2021-10-20 MED ORDER — BUPIVACAINE LIPOSOME 1.3 % IJ SUSP
INTRAMUSCULAR | Status: DC | PRN
Start: 2021-10-20 — End: 2021-10-20
  Administered 2021-10-20: 20 mL

## 2021-10-20 MED ORDER — BUPIVACAINE LIPOSOME 1.3 % IJ SUSP
INTRAMUSCULAR | Status: AC
  Filled 2021-10-20: qty 20

## 2021-10-20 MED ORDER — THROMBIN 5000 UNITS EX SOLR
CUTANEOUS | Status: AC
  Filled 2021-10-20: qty 5000

## 2021-10-20 MED ORDER — ORAL CARE MOUTH RINSE: 15.0000 mL | Freq: Once | OROMUCOSAL | Status: AC

## 2021-10-20 MED ORDER — VANCOMYCIN HCL 1000 MG IV SOLR
INTRAVENOUS | Status: DC | PRN
Start: 2021-10-20 — End: 2021-10-20
  Administered 2021-10-20: 1000 mg via TOPICAL

## 2021-10-20 MED ORDER — CHLORHEXIDINE GLUCONATE CLOTH 2 % EX PADS: 6.0000 | MEDICATED_PAD | Freq: Once | CUTANEOUS | Status: DC

## 2021-10-20 MED ORDER — DEXAMETHASONE SODIUM PHOSPHATE 10 MG/ML IJ SOLN
INTRAMUSCULAR | Status: AC
  Filled 2021-10-20: qty 1

## 2021-10-20 MED ORDER — FLEET ENEMA 7-19 GM/118ML RE ENEM: 1.0000 | ENEMA | Freq: Once | RECTAL | Status: DC | PRN

## 2021-10-20 MED ORDER — OXYCODONE HCL 5 MG PO TABS: 5.0000 mg | ORAL_TABLET | ORAL | Status: DC | PRN

## 2021-10-20 MED ORDER — LIDOCAINE 2% (20 MG/ML) 5 ML SYRINGE
INTRAMUSCULAR | Status: AC
  Filled 2021-10-20: qty 5

## 2021-10-20 MED ORDER — METHOCARBAMOL 500 MG PO TABS
500.0000 mg | ORAL_TABLET | Freq: Four times a day (QID) | ORAL | Status: DC | PRN
Start: 2021-10-20 — End: 2021-10-21
  Administered 2021-10-20 – 2021-10-21 (×2): 500 mg via ORAL
  Filled 2021-10-20 (×2): qty 1

## 2021-10-20 MED ORDER — PROPOFOL 10 MG/ML IV BOLUS
INTRAVENOUS | Status: DC | PRN
  Administered 2021-10-20: 150 mg via INTRAVENOUS

## 2021-10-20 MED ORDER — PHENYLEPHRINE 40 MCG/ML (10ML) SYRINGE FOR IV PUSH (FOR BLOOD PRESSURE SUPPORT)
PREFILLED_SYRINGE | INTRAVENOUS | Status: AC
  Filled 2021-10-20: qty 10

## 2021-10-20 MED ORDER — BUPIVACAINE HCL (PF) 0.5 % IJ SOLN
INTRAMUSCULAR | Status: DC | PRN
  Administered 2021-10-20: 30 mL

## 2021-10-20 MED ORDER — METHOCARBAMOL 1000 MG/10ML IJ SOLN
500.0000 mg | Freq: Four times a day (QID) | INTRAVENOUS | Status: DC | PRN
  Filled 2021-10-20: qty 5

## 2021-10-20 MED ORDER — DOCUSATE SODIUM 100 MG PO CAPS: 100.0000 mg | ORAL_CAPSULE | Freq: Two times a day (BID) | ORAL | Status: DC

## 2021-10-20 MED ORDER — CHLORHEXIDINE GLUCONATE 0.12 % MT SOLN: 15.0000 mL | Freq: Once | OROMUCOSAL | Status: AC

## 2021-10-20 MED ORDER — PHENYLEPHRINE 40 MCG/ML (10ML) SYRINGE FOR IV PUSH (FOR BLOOD PRESSURE SUPPORT)
PREFILLED_SYRINGE | INTRAVENOUS | Status: DC | PRN
  Administered 2021-10-20: 80 ug via INTRAVENOUS
  Administered 2021-10-20: 120 ug via INTRAVENOUS
  Administered 2021-10-20: 80 ug via INTRAVENOUS
  Administered 2021-10-20: 40 ug via INTRAVENOUS

## 2021-10-20 MED ORDER — SODIUM CHLORIDE 0.9% FLUSH: 3.0000 mL | INTRAVENOUS | Status: DC | PRN

## 2021-10-20 MED ORDER — GLYCOPYRROLATE 0.2 MG/ML IJ SOLN
INTRAMUSCULAR | Status: DC | PRN
  Administered 2021-10-20 (×2): .1 mg via INTRAVENOUS

## 2021-10-20 MED ORDER — OXYCODONE HCL 5 MG PO TABS
10.0000 mg | ORAL_TABLET | ORAL | Status: DC | PRN
  Administered 2021-10-20 – 2021-10-21 (×5): 10 mg via ORAL
  Filled 2021-10-20 (×5): qty 2

## 2021-10-20 MED ORDER — DEXAMETHASONE SODIUM PHOSPHATE 10 MG/ML IJ SOLN
INTRAMUSCULAR | Status: DC | PRN
  Administered 2021-10-20: 10 mg via INTRAVENOUS

## 2021-10-20 MED ORDER — ENOXAPARIN SODIUM 40 MG/0.4ML IJ SOSY: 40.0000 mg | PREFILLED_SYRINGE | INTRAMUSCULAR | Status: DC

## 2021-10-20 MED ORDER — FENTANYL CITRATE (PF) 250 MCG/5ML IJ SOLN
INTRAMUSCULAR | Status: AC
  Filled 2021-10-20: qty 5

## 2021-10-20 MED ORDER — ONDANSETRON HCL 4 MG PO TABS: 4.0000 mg | ORAL_TABLET | Freq: Four times a day (QID) | ORAL | Status: DC | PRN

## 2021-10-20 MED ORDER — LIDOCAINE-EPINEPHRINE 1 %-1:100000 IJ SOLN
INTRAMUSCULAR | Status: AC
  Filled 2021-10-20: qty 1

## 2021-10-20 MED ORDER — PROPOFOL 10 MG/ML IV BOLUS
INTRAVENOUS | Status: AC
  Filled 2021-10-20: qty 20

## 2021-10-20 MED ORDER — OXYCODONE HCL 5 MG PO TABS: 5.0000 mg | ORAL_TABLET | Freq: Once | ORAL | Status: DC | PRN

## 2021-10-20 MED ORDER — ONDANSETRON HCL 4 MG/2ML IJ SOLN: 4.0000 mg | Freq: Four times a day (QID) | INTRAMUSCULAR | Status: DC | PRN

## 2021-10-20 MED ORDER — GLYCOPYRROLATE PF 0.2 MG/ML IJ SOSY
PREFILLED_SYRINGE | INTRAMUSCULAR | Status: AC
  Filled 2021-10-20: qty 1

## 2021-10-20 MED ORDER — LIDOCAINE-EPINEPHRINE 1 %-1:100000 IJ SOLN
INTRAMUSCULAR | Status: DC | PRN
  Administered 2021-10-20: 9 mL

## 2021-10-20 MED ORDER — LACTATED RINGERS IV SOLN: INTRAVENOUS | Status: DC

## 2021-10-20 MED ORDER — SODIUM CHLORIDE 0.9% FLUSH: 3.0000 mL | Freq: Two times a day (BID) | INTRAVENOUS | Status: DC

## 2021-10-20 MED ORDER — SODIUM CHLORIDE 0.9 % IV SOLN
250.0000 mL | INTRAVENOUS | Status: DC
  Administered 2021-10-20: 250 mL via INTRAVENOUS

## 2021-10-20 MED ORDER — MIDAZOLAM HCL 2 MG/2ML IJ SOLN
INTRAMUSCULAR | Status: AC
  Filled 2021-10-20: qty 2

## 2021-10-20 MED ORDER — VITAMIN D 25 MCG (1000 UNIT) PO TABS
1000.0000 [IU] | ORAL_TABLET | Freq: Every day | ORAL | Status: DC
  Administered 2021-10-21: 1000 [IU] via ORAL
  Filled 2021-10-20: qty 1

## 2021-10-20 MED ORDER — OXYCODONE HCL 5 MG/5ML PO SOLN: 5.0000 mg | Freq: Once | ORAL | Status: DC | PRN

## 2021-10-20 MED ORDER — ACETAMINOPHEN 650 MG RE SUPP: 650.0000 mg | RECTAL | Status: DC | PRN

## 2021-10-20 MED ORDER — FENTANYL CITRATE (PF) 250 MCG/5ML IJ SOLN
INTRAMUSCULAR | Status: DC | PRN
  Administered 2021-10-20: 100 ug via INTRAVENOUS

## 2021-10-20 MED ORDER — LORATADINE 10 MG PO TABS
10.0000 mg | ORAL_TABLET | Freq: Every day | ORAL | Status: DC
  Administered 2021-10-21: 10 mg via ORAL
  Filled 2021-10-20: qty 1

## 2021-10-20 MED ORDER — BUPIVACAINE HCL (PF) 0.5 % IJ SOLN
INTRAMUSCULAR | Status: AC
  Filled 2021-10-20: qty 30

## 2021-10-20 MED ORDER — CHLORHEXIDINE GLUCONATE CLOTH 2 % EX PADS
6.0000 | MEDICATED_PAD | Freq: Once | CUTANEOUS | Status: DC
Start: 2021-10-20 — End: 2021-10-20

## 2021-10-20 MED ORDER — CEFAZOLIN SODIUM-DEXTROSE 2-4 GM/100ML-% IV SOLN
2.0000 g | INTRAVENOUS | Status: AC
  Administered 2021-10-20: 2 g via INTRAVENOUS

## 2021-10-20 MED ORDER — PHENOL 1.4 % MT LIQD: 1.0000 | OROMUCOSAL | Status: DC | PRN

## 2021-10-20 MED ORDER — ROCURONIUM BROMIDE 10 MG/ML (PF) SYRINGE
PREFILLED_SYRINGE | INTRAVENOUS | Status: DC | PRN
  Administered 2021-10-20 (×4): 20 mg via INTRAVENOUS
  Administered 2021-10-20: 40 mg via INTRAVENOUS
  Administered 2021-10-20: 60 mg via INTRAVENOUS

## 2021-10-20 MED ORDER — LIDOCAINE 2% (20 MG/ML) 5 ML SYRINGE
INTRAMUSCULAR | Status: DC | PRN
  Administered 2021-10-20: 60 mg via INTRAVENOUS

## 2021-10-20 MED ORDER — POLYETHYLENE GLYCOL 3350 17 G PO PACK: 17.0000 g | PACK | Freq: Every day | ORAL | Status: DC | PRN

## 2021-10-20 MED ORDER — ROCURONIUM BROMIDE 10 MG/ML (PF) SYRINGE
PREFILLED_SYRINGE | INTRAVENOUS | Status: AC
  Filled 2021-10-20: qty 20

## 2021-10-20 MED ORDER — ONDANSETRON HCL 4 MG/2ML IJ SOLN
INTRAMUSCULAR | Status: DC | PRN
  Administered 2021-10-20: 4 mg via INTRAVENOUS

## 2021-10-20 MED ORDER — POTASSIUM CHLORIDE IN NACL 20-0.9 MEQ/L-% IV SOLN: INTRAVENOUS | Status: DC

## 2021-10-20 MED ORDER — 0.9 % SODIUM CHLORIDE (POUR BTL) OPTIME
TOPICAL | Status: DC | PRN
  Administered 2021-10-20: 1000 mL

## 2021-10-20 MED ORDER — ACETAMINOPHEN 325 MG PO TABS
650.0000 mg | ORAL_TABLET | ORAL | Status: DC | PRN
  Administered 2021-10-20 – 2021-10-21 (×2): 650 mg via ORAL
  Filled 2021-10-20 (×2): qty 2

## 2021-10-20 MED ORDER — CHLORHEXIDINE GLUCONATE 0.12 % MT SOLN
OROMUCOSAL | Status: AC
  Administered 2021-10-20: 15 mL via OROMUCOSAL
  Filled 2021-10-20: qty 15

## 2021-10-20 MED ORDER — MENTHOL 3 MG MT LOZG: 1.0000 | LOZENGE | OROMUCOSAL | Status: DC | PRN

## 2021-10-20 MED ORDER — DOCUSATE SODIUM 100 MG PO CAPS
100.0000 mg | ORAL_CAPSULE | Freq: Two times a day (BID) | ORAL | Status: DC
  Administered 2021-10-20 – 2021-10-21 (×2): 100 mg via ORAL
  Filled 2021-10-20 (×2): qty 1

## 2021-10-20 MED ORDER — CEFAZOLIN SODIUM-DEXTROSE 1-4 GM/50ML-% IV SOLN
1.0000 g | Freq: Three times a day (TID) | INTRAVENOUS | Status: AC
  Administered 2021-10-20 – 2021-10-21 (×3): 1 g via INTRAVENOUS
  Filled 2021-10-20 (×3): qty 50

## 2021-10-20 MED ORDER — THROMBIN 5000 UNITS EX SOLR
OROMUCOSAL | Status: DC | PRN
  Administered 2021-10-20 (×2): 5 mL via TOPICAL

## 2021-10-20 MED ORDER — HYDROMORPHONE HCL 1 MG/ML IJ SOLN: 0.5000 mg | INTRAMUSCULAR | Status: DC | PRN

## 2021-10-20 SURGICAL SUPPLY — 75 items
BAG COUNTER SPONGE SURGICOUNT (BAG) ×3 IMPLANT
BAND RUBBER #18 3X1/16 STRL (MISCELLANEOUS) IMPLANT
BASKET BONE COLLECTION (BASKET) ×1 IMPLANT
BLADE CLIPPER SURG (BLADE) ×1 IMPLANT
BUR CARBIDE MATCH 3.0 (BURR) ×1 IMPLANT
BUR MATCHSTICK NEURO 3.0 LAGG (BURR) ×1 IMPLANT
BUR PRECISION FLUTE 5.0 (BURR) ×1 IMPLANT
CANISTER SUCT 3000ML PPV (MISCELLANEOUS) ×2 IMPLANT
CNTNR URN SCR LID CUP LEK RST (MISCELLANEOUS) ×1 IMPLANT
CONT SPEC 4OZ STRL OR WHT (MISCELLANEOUS) ×4
COVER BACK TABLE 60X90IN (DRAPES) ×2 IMPLANT
DECANTER SPIKE VIAL GLASS SM (MISCELLANEOUS) ×1 IMPLANT
DERMABOND ADVANCED (GAUZE/BANDAGES/DRESSINGS) ×2
DERMABOND ADVANCED .7 DNX12 (GAUZE/BANDAGES/DRESSINGS) ×1 IMPLANT
DRAIN JACKSON PRATT 10MM FLAT (MISCELLANEOUS) ×1 IMPLANT
DRAPE 3/4 80X56 (DRAPES) ×2 IMPLANT
DRAPE C-ARM 42X72 X-RAY (DRAPES) ×2 IMPLANT
DRAPE C-ARMOR (DRAPES) ×2 IMPLANT
DRAPE LAPAROTOMY 100X72X124 (DRAPES) ×2 IMPLANT
DRAPE MICROSCOPE LEICA (MISCELLANEOUS) IMPLANT
DRSG OPSITE POSTOP 4X6 (GAUZE/BANDAGES/DRESSINGS) IMPLANT
DRSG OPSITE POSTOP 4X8 (GAUZE/BANDAGES/DRESSINGS) ×1 IMPLANT
DURAPREP 26ML APPLICATOR (WOUND CARE) ×2 IMPLANT
ELECT BLADE INSULATED 6.5IN (ELECTROSURGICAL) ×2
ELECT REM PT RETURN 9FT ADLT (ELECTROSURGICAL) ×2
ELECTRODE BLDE INSULATED 6.5IN (ELECTROSURGICAL) ×1 IMPLANT
ELECTRODE REM PT RTRN 9FT ADLT (ELECTROSURGICAL) ×1 IMPLANT
EVACUATOR SILICONE 100CC (DRAIN) ×1 IMPLANT
GAUZE 4X4 16PLY ~~LOC~~+RFID DBL (SPONGE) ×1 IMPLANT
GAUZE SPONGE 4X4 12PLY STRL (GAUZE/BANDAGES/DRESSINGS) ×1 IMPLANT
GLOVE EXAM NITRILE XL STR (GLOVE) IMPLANT
GLOVE SURG LTX SZ7.5 (GLOVE) ×2 IMPLANT
GLOVE SURG UNDER POLY LF SZ7 (GLOVE) ×1 IMPLANT
GLOVE SURG UNDER POLY LF SZ7.5 (GLOVE) ×2 IMPLANT
GOWN STRL REUS W/ TWL LRG LVL3 (GOWN DISPOSABLE) ×1 IMPLANT
GOWN STRL REUS W/ TWL XL LVL3 (GOWN DISPOSABLE) ×1 IMPLANT
GOWN STRL REUS W/TWL 2XL LVL3 (GOWN DISPOSABLE) IMPLANT
GOWN STRL REUS W/TWL LRG LVL3 (GOWN DISPOSABLE) ×2
GOWN STRL REUS W/TWL XL LVL3 (GOWN DISPOSABLE) ×2
HEMOSTAT POWDER KIT SURGIFOAM (HEMOSTASIS) ×3 IMPLANT
KIT BASIN OR (CUSTOM PROCEDURE TRAY) ×2 IMPLANT
KIT INFUSE XX SMALL 0.7CC (Orthopedic Implant) ×1 IMPLANT
KIT TURNOVER KIT B (KITS) ×2 IMPLANT
MARKER SKIN DUAL TIP RULER LAB (MISCELLANEOUS) ×2 IMPLANT
MILL MEDIUM DISP (BLADE) ×2 IMPLANT
NDL HYPO 18GX1.5 BLUNT FILL (NEEDLE) IMPLANT
NDL HYPO 21X1.5 SAFETY (NEEDLE) IMPLANT
NDL SPNL 18GX3.5 QUINCKE PK (NEEDLE) IMPLANT
NEEDLE HYPO 18GX1.5 BLUNT FILL (NEEDLE) IMPLANT
NEEDLE HYPO 21X1.5 SAFETY (NEEDLE) ×4 IMPLANT
NEEDLE HYPO 22GX1.5 SAFETY (NEEDLE) ×2 IMPLANT
NEEDLE SPNL 18GX3.5 QUINCKE PK (NEEDLE) IMPLANT
NS IRRIG 1000ML POUR BTL (IV SOLUTION) ×2 IMPLANT
PACK LAMINECTOMY NEURO (CUSTOM PROCEDURE TRAY) ×2 IMPLANT
PAD ARMBOARD 7.5X6 YLW CONV (MISCELLANEOUS) ×6 IMPLANT
PIN FIXATION HOLDING F/PLATE 2 (Plate) ×4 IMPLANT
PUTTY BONE DBX 2.5 MIS (Bone Implant) ×1 IMPLANT
PUTTY DBF GRAFTON 3CC W/DELIVE (Putty) ×1 IMPLANT
PUTTY GRAFTON DBF 6CC W/DELIVE (Putty) ×1 IMPLANT
ROD 40MM SOLERA PREBENT (Rod) ×1 IMPLANT
ROD 45MM SOLERA PREBENT (Rod) ×1 IMPLANT
SCREW CORT SHANK HOR 6.5X45 (Screw) ×4 IMPLANT
SCREW MAS/SET STERILE 4PK (Screw) ×1 IMPLANT
SPACER PL CATALYFT LONG 11 (Spacer) ×2 IMPLANT
SPONGE SURGIFOAM ABS GEL 100 (HEMOSTASIS) IMPLANT
SPONGE T-LAP 4X18 ~~LOC~~+RFID (SPONGE) ×1 IMPLANT
SUT MNCRL AB 4-0 PS2 18 (SUTURE) ×2 IMPLANT
SUT VIC AB 0 CT1 18XCR BRD8 (SUTURE) ×1 IMPLANT
SUT VIC AB 0 CT1 8-18 (SUTURE) ×2
SUT VIC AB 2-0 CP2 18 (SUTURE) ×2 IMPLANT
SYR 30ML LL (SYRINGE) ×2 IMPLANT
TOWEL GREEN STERILE (TOWEL DISPOSABLE) ×2 IMPLANT
TOWEL GREEN STERILE FF (TOWEL DISPOSABLE) ×2 IMPLANT
TRAY FOLEY MTR SLVR 16FR STAT (SET/KITS/TRAYS/PACK) ×2 IMPLANT
WATER STERILE IRR 1000ML POUR (IV SOLUTION) ×2 IMPLANT

## 2021-10-20 NOTE — Anesthesia Preprocedure Evaluation (Signed)
Anesthesia Evaluation  ?Patient identified by MRN, date of birth, ID band ?Patient awake ? ? ? ?Reviewed: ?Allergy & Precautions, H&P , NPO status , Patient's Chart, lab work & pertinent test results ? ?Airway ?Mallampati: II ? ? ?Neck ROM: full ? ? ? Dental ?  ?Pulmonary ?neg pulmonary ROS,  ?  ?breath sounds clear to auscultation ? ? ? ? ? ? Cardiovascular ?negative cardio ROS ? ? ?Rhythm:regular Rate:Normal ? ? ?  ?Neuro/Psych ?  ? GI/Hepatic ?GERD  ,  ?Endo/Other  ?Morbid obesity ? Renal/GU ?  ? ?  ?Musculoskeletal ? ?(+) Arthritis ,  ? Abdominal ?  ?Peds ? Hematology ?  ?Anesthesia Other Findings ? ? Reproductive/Obstetrics ? ?  ? ? ? ? ? ? ? ? ? ? ? ? ? ?  ?  ? ? ? ? ? ? ? ? ?Anesthesia Physical ?Anesthesia Plan ? ?ASA: 2 ? ?Anesthesia Plan: General  ? ?Post-op Pain Management:   ? ?Induction: Intravenous ? ?PONV Risk Score and Plan: 2 and Ondansetron, Dexamethasone and Treatment may vary due to age or medical condition ? ?Airway Management Planned: Oral ETT ? ?Additional Equipment:  ? ?Intra-op Plan:  ? ?Post-operative Plan: Extubation in OR ? ?Informed Consent: I have reviewed the patients History and Physical, chart, labs and discussed the procedure including the risks, benefits and alternatives for the proposed anesthesia with the patient or authorized representative who has indicated his/her understanding and acceptance.  ? ? ? ?Dental advisory given ? ?Plan Discussed with: CRNA, Anesthesiologist and Surgeon ? ?Anesthesia Plan Comments:   ? ? ? ? ? ? ?Anesthesia Quick Evaluation ? ?

## 2021-10-20 NOTE — Anesthesia Procedure Notes (Signed)
Procedure Name: Intubation ?Date/Time: 10/20/2021 8:11 AM ?Performed by: Vonna Drafts, CRNA ?Pre-anesthesia Checklist: Patient identified, Emergency Drugs available, Suction available and Patient being monitored ?Patient Re-evaluated:Patient Re-evaluated prior to induction ?Oxygen Delivery Method: Circle system utilized ?Preoxygenation: Pre-oxygenation with 100% oxygen ?Induction Type: IV induction ?Ventilation: Two handed mask ventilation required and Oral airway inserted - appropriate to patient size ?Laryngoscope Size: Glidescope and 4 ?Grade View: Grade I ?Tube type: Oral ?Tube size: 7.5 mm ?Number of attempts: 1 ?Airway Equipment and Method: Stylet and Oral airway ?Placement Confirmation: ETT inserted through vocal cords under direct vision, positive ETCO2 and breath sounds checked- equal and bilateral ?Secured at: 23 cm ?Tube secured with: Tape ?Dental Injury: Teeth and Oropharynx as per pre-operative assessment  ? ? ? ? ?

## 2021-10-20 NOTE — Op Note (Signed)
PREOP DIAGNOSIS: L4-5 lumbar spondylolisthesis and stenosis ?  ?POSTOP DIAGNOSIS: L4-5 lumbar spondylolisthesis and stenosis ?  ?PROCEDURE: ?1.  L4-5 lumbar interbody fusion via bilateral transforaminal approach, including laminectomy, bilateral facetectomies and foraminotomies ?2.  L4-5 posterolateral arthrodesis ?3. Placement of interbody cages x2 at L4-5 ?4. Nonsegmental instrumentation with cortical pedicle screw and rod construct at L4-5 ?5. Harvest of local autograft ?6. Use of morselized allograft ?  ?SURGEON: Dr. Duffy Rhody, MD ?  ?ASSISTANT: None ? ?ANESTHESIA: General Endotracheal ?  ?EBL: 300 ml ?  ?IMPLANTS:  ?[employer] ?6.5 x 45 mm pedicle screws x 4 ?11-16 mm Expandable catalyft cage ?45 mm/40 mm rods ? ?SPECIMENS: None ?  ?DRAINS: None ?  ?COMPLICATIONS: None ?  ?CONDITION: Stable to PACU ?  ?HISTORY: ?This is a 76 yo M with hx of ACDF for cervical myelopathy who c/o neurogenic claudication symptoms and back pain.  He was found to have severe L4-5 stenosis with grade 1/2 spondylolisthesis.  Nonsurgical measures failed to alleviate his pain.  Treatment options were discussed and the patient elected to proceed with posterior lumbar interbody fusion. risks, benefits, alternatives, and expected convalescence were discussed with the patient.  Risks discussed included but were not limited to bleeding, pain, infection, scar, pseudoarthrosis, CSF leak, neurologic deficit, paralysis, and death.  The patient wished to proceed with surgery and informed consent was obtained. ?  ?PROCEDURE IN DETAIL: ?After informed consent was obtained and witnessed, the patient was brought to the operating room. After induction of general anesthesia, the patient was positioned on the operative table in the prone position on a Wilson frame in neutral position with all pressure points meticulously padded. The skin of the low back was then prepped and draped in the usual sterile fashion.  1% lidocaine with epinephrine was  injected in the skin and a timeout was performed.  Preoperative antibiotics were administered.  Incision was made with a 10 blade over the L4-5 interspace as confirmed on C-arm.  Subcutaneous tissue and the fascia was incised with monopolar electrocautery.  The paraspinous muscles were dissected off the lamina in subperiosteal fashion.  Additional dissection continued out laterally, exposing the transverse processes of L5 bilaterally which were then decorticated.  C-arm x-ray confirmed appropriate levels.  Using AP C arm x-ray, pilot holes were drilled for cortical screws at L4 bilaterally.  The C-arm was then switched to lateral x-ray and the pedicles were cannulated with the high-speed drill followed by a awl tip tap.  Ball ended feeler confirmed good bony channels circumferentially and at the depth.  The interspinous ligament at L4-5  was then removed and laminectomy of the bottom two thirds of the lamina as well as the superior third of the inferior  lamina was performed with high-speed drill.  Facetectomies were then performed as well bilaterally, with bone harvested for autograft.  The bilateral foramina were opened and the disc space was exposed bilaterally.  Discectomy was then performed bilaterally side with 15 blade, rongeurs, and various ringed and angled curettes as well as disc shavers using C arm for guidance.  Trials were placed and appropriate interbody implants were selected.  The interbody space was then packed with extra extra small BMP as well as autograft and allograft.  Under C-arm guidance, the interbody implants were tamped into place and then expanded under lateral x-ray guidance until snug.  Partial reduction of his spondylolisthesis was noted.  The cages were backfilled and detached.  Using C-arm x-ray, screws were then placed in the previously  tapped cortical pedicle screw holes.  Meticulous hemostasis was obtained.  Good decompression was confirmed with easy passage of ball ended nerve  hook.  The borders of the L5 pedicles were palpated and pilot holes and screw paths were tapped under C-arm guidance.  Ball ended feeler confirmed good bony channels.  Pedicle screws were then placed at L5 under C-arm guidance with good purchase. Tulip heads were then placed on the screws and rods were placed bilaterally and secured with screw caps and final tightened.  Final AP and lateral C-arm x-rays showed good placement of the implants.  Autograft and allograft was placed in the lateral gutters between the transverse processes bilaterally.  A medium JP drain was placed in the subfascial space and tunneled out the skin and secured with a stitch. ?  ?Wound was irrigated thoroughly.  Vancomycin powder was placed in thew wound.  Exparel mixed with Marcaine was injected into the paraspinous muscles and subcutaneous tissues bilaterally.  The fascia was closed with 0 Vicryl stitches.  The dermal layer was closed with 2-0 Vicryl stitches in buried interrupted fashion.  The skin incisions were closed with 4-0 Monocryl subcuticular manner followed by Dermabond.  Sterile dressings were placed.  Patient was then flipped supine and extubated by the anesthesia service following commands and all 4 extremities.  All counts were correct at the end of surgery.  No complications were noted.  ? ?

## 2021-10-20 NOTE — Progress Notes (Signed)
Orthopedic Tech Progress Note ?Patient Details:  ?Derek [name] ?1945-09-10 ?[id number] ? ?Ortho Devices ?Type of Ortho Device: Lumbar corsett ?Ortho Device/Splint Location: BACK ?Ortho Device/Splint Interventions: Ordered ?  ?Post Interventions ?Patient Tolerated: Well ?Instructions Provided: Care of device ? ?Donald Pore ?10/20/2021, 5:02 PM ? ?

## 2021-10-20 NOTE — Transfer of Care (Signed)
Immediate Anesthesia Transfer of Care Note ? ?Patient: Derek [name] ? ?Procedure(s) Performed: Transforaminal Lumbar Interbody Fusion Lumbar Four-Five ? ?Patient Location: PACU ? ?Anesthesia Type:General ? ?Level of Consciousness: drowsy ? ?Airway & Oxygen Therapy: Patient Spontanous Breathing and Patient connected to face mask oxygen ? ?Post-op Assessment: Report given to RN and Post -op Vital signs reviewed and stable ? ?Post vital signs: Reviewed and stable ? ?Last Vitals:  ?Vitals Value Taken Time  ?BP 119/95 10/20/21 1251  ?Temp    ?Pulse 65 10/20/21 1252  ?Resp 14 10/20/21 1252  ?SpO2 97 % 10/20/21 1252  ?Vitals shown include unvalidated device data. ? ?Last Pain:  ?Vitals:  ? 10/20/21 0607  ?TempSrc:   ?PainSc: 0-No pain  ?   ? ?  ? ?Complications: No notable events documented. ?

## 2021-10-20 NOTE — H&P (Signed)
CC: back and leg pain ? ?HPI: ? ?  ? ?Patient is a 76 y.o. male presents with severe lumbar stenosis with L>R radiculopathy, L4-5 spondylolisthesis.  He is here for elective TLIF. ? ? ? ?Patient Active Problem List  ? Diagnosis Date Noted  ? Stenosis of cervical spine with myelopathy (HCC) 05/06/2021  ? Status post total left knee replacement 07/22/2020  ? Unilateral primary osteoarthritis, left knee 03/24/2020  ? Knee pain, chronic 06/15/2016  ? GERD (gastroesophageal reflux disease)   ? Morbid obesity (HCC)   ? Urethritis 03/20/2013  ? ?Past Medical History:  ?Diagnosis Date  ? Arthritis   ? left knee  ? GERD (gastroesophageal reflux disease)   ? Hypercholesterolemia   ? Obesity   ? Urethritis   ? when he was in [employer]  ?  ?Past Surgical History:  ?Procedure Laterality Date  ? ANTERIOR CERVICAL DECOMP/DISCECTOMY FUSION N/A 05/06/2021  ? Procedure: Anterior Cervical Discectomy Fusion Cervical Three-Four, Cervical Four-Five;  Surgeon: Bedelia Person, MD;  Location: [facility] OR;  Service: Neurosurgery;  Laterality: N/A;  ? COLONOSCOPY    ? TOTAL KNEE ARTHROPLASTY Left 07/22/2020  ? Procedure: LEFT TOTAL KNEE ARTHROPLASTY;  Surgeon: Kathryne Hitch, MD;  Location: [facility] OR;  Service: Orthopedics;  Laterality: Left;  ?  ?Medications Prior to Admission  ?Medication Sig Dispense Refill Last Dose  ? aspirin 325 MG EC tablet Take 325 mg by mouth every 6 (six) hours as needed for pain.   10/15/2021  ? cetirizine (ZYRTEC) 10 MG tablet Take 10 mg by mouth daily.   10/19/2021  ? cholecalciferol (VITAMIN D) 1000 UNITS tablet Take 1,000 Units by mouth daily.   10/19/2021  ? omeprazole (PRILOSEC) 20 MG capsule Take 20 mg by mouth daily before breakfast.   Past Week  ? trolamine salicylate (ASPERCREME) 10 % cream Apply 1 application topically as needed for muscle pain.   Past Month  ? docusate sodium (COLACE) 100 MG capsule Take 1 capsule (100 mg total) by mouth 2 (two) times daily. (Patient not taking: Reported on 10/13/2021) 50  capsule 2 Not Taking  ? methocarbamol (ROBAXIN) 500 MG tablet Take 1 tablet (500 mg total) by mouth every 6 (six) hours as needed for muscle spasms. (Patient not taking: Reported on 10/13/2021) 30 tablet 0 Not Taking  ? oxyCODONE-acetaminophen (PERCOCET) 5-325 MG tablet Take 1 tablet by mouth every 4 (four) hours as needed for severe pain. (Patient not taking: Reported on 10/13/2021) 45 tablet 0 Not Taking  ? ?No Known Allergies  ?Social History  ? ?Tobacco Use  ? Smoking status: Never  ? Smokeless tobacco: Never  ?Substance Use Topics  ? Alcohol use: No  ?  Alcohol/week: 0.0 standard drinks  ?  ?Family History  ?Problem Relation Age of Onset  ? Cancer Mother   ?     breast  ? Diabetes Father   ?  ? ?Review of Systems ?Pertinent items are noted in HPI. ? ?Objective:  ? ?Patient Vitals for the past 8 hrs: ? BP Temp Temp src Pulse Resp SpO2 Height Weight  ?10/20/21 0547 (!) 142/74 98.3 ?F (36.8 ?C) Oral 61 18 93 % 5\' 6"  (1.676 m) 111.6 kg  ? ?No intake/output data recorded. ?No intake/output data recorded. ? ? ? ? ? General : Alert, cooperative, no distress, appears stated age  ? Head:  Normocephalic/atraumatic   ? Eyes: PERRL, conjunctiva/corneas clear, EOM's intact. Fundi could not be visualized ?Neck: Supple ?Chest:  Respirations unlabored ?Chest wall: no tenderness  or deformity ?Heart: Regular rate and rhythm ?Abdomen: Soft, nontender and nondistended ?Extremities: warm and well-perfused ?Skin: normal turgor, color and texture ?Neurologic:  Alert, oriented x 3.  Eyes open spontaneously. PERRL, EOMI, VFC, no facial droop. V1-3 intact.  No dysarthria, tongue protrusion symmetric.  CNII-XII intact. Normal strength, sensation.  Decreased reflexes.  No pronator drift, full strength in legs ?Anterior neck incision well-healed.  + SLR bilaterally.  ?  ? ? ? ?Data ReviewCBC:  ?Lab Results  ?Component Value Date  ? WBC 7.7 10/16/2021  ? RBC 3.32 (L) 10/16/2021  ? ?BMP:  ?Lab Results  ?Component Value Date  ? GLUCOSE 99  08/27/2021  ? GLUCOSE 118 (H) 07/23/2020  ? CO2 24 08/27/2021  ? BUN 14 08/27/2021  ? CREATININE 1.05 08/27/2021  ? CALCIUM 10.2 08/27/2021  ? ?Radiology review:  ?See clinic note ? ?Assessment:  ? ?Active Problems: ?  * No active hospital problems. * ? ?Severe lumbar stenosis with claudication, L4-5 spondylolisthesis ?Plan:  ? ?- TLIF today ? ? ?

## 2021-10-20 NOTE — Anesthesia Postprocedure Evaluation (Signed)
Anesthesia Post Note ? ?Patient: Derek [name] ? ?Procedure(s) Performed: Transforaminal Lumbar Interbody Fusion Lumbar Four-Five ? ?  ? ?Patient location during evaluation: PACU ?Anesthesia Type: General ?Level of consciousness: awake and alert ?Pain management: pain level controlled ?Vital Signs Assessment: post-procedure vital signs reviewed and stable ?Respiratory status: spontaneous breathing, nonlabored ventilation, respiratory function stable and patient connected to nasal cannula oxygen ?Cardiovascular status: blood pressure returned to baseline and stable ?Postop Assessment: no apparent nausea or vomiting ?Anesthetic complications: no ? ? ?No notable events documented. ? ?Last Vitals:  ?Vitals:  ? 10/20/21 1454 10/20/21 1621  ?BP: 130/83 99/81  ?Pulse: (!) 54 71  ?Resp: 18 18  ?Temp: 36.6 ?C 36.7 ?C  ?SpO2: 96% 96%  ?  ?Last Pain:  ?Vitals:  ? 10/20/21 1351  ?TempSrc:   ?PainSc: 0-No pain  ? ? ?  ?  ?  ?  ?  ?  ? ?[place] ? ? ? ? ?

## 2021-10-21 DIAGNOSIS — M48062 Spinal stenosis, lumbar region with neurogenic claudication: Secondary | ICD-10-CM | POA: Diagnosis not present

## 2021-10-21 DIAGNOSIS — Z7982 Long term (current) use of aspirin: Secondary | ICD-10-CM | POA: Diagnosis not present

## 2021-10-21 DIAGNOSIS — M4316 Spondylolisthesis, lumbar region: Secondary | ICD-10-CM | POA: Diagnosis not present

## 2021-10-21 DIAGNOSIS — Z96652 Presence of left artificial knee joint: Secondary | ICD-10-CM | POA: Diagnosis not present

## 2021-10-21 MED ORDER — METHOCARBAMOL 500 MG PO TABS: 500.0000 mg | ORAL_TABLET | Freq: Four times a day (QID) | ORAL | 2 refills | Status: DC

## 2021-10-21 MED ORDER — OXYCODONE-ACETAMINOPHEN 5-325 MG PO TABS: 1.0000 | ORAL_TABLET | ORAL | 0 refills | Status: AC | PRN

## 2021-10-21 MED FILL — Thrombin For Soln 5000 Unit: CUTANEOUS | Qty: 5000 | Status: AC

## 2021-10-21 NOTE — Evaluation (Addendum)
Occupational Therapy Evaluation ?Patient Details ?Name: Derek [name] ?MRN: [id number] ?DOB: [birth year] ?Today's Date: 10/21/2021 ? ? ?History of Present Illness 76 y/o male admitted on 10/20/21 following TLIF L4-5. PMH: hx of ACDF in 04/2021, obesit, s/p Lt TKA  ? ?Clinical Impression ?  ?Patient evaluated by Occupational Therapy with no further acute OT needs identified. All education has been completed and the patient has no further questions. All education provided.  Pt requires up to mod A for LB ADLs.  He reports he will have assist at discharge, but will benefit from further [facility] to reinforce precautions and further practice of ADLs.  See below for any follow-up Occupational Therapy or equipment needs. OT is signing off. Thank you for this referral. ?  ?   ? ?Recommendations for follow up therapy are one component of a multi-disciplinary discharge planning process, led by the attending physician.  Recommendations may be updated based on patient status, additional functional criteria and insurance authorization.  ? ?Follow Up Recommendations ? Home health OT  ?  ?Assistance Recommended at Discharge Frequent or constant Supervision/Assistance  ?Patient can return home with the following A little help with walking and/or transfers;A little help with bathing/dressing/bathroom;Assistance with cooking/housework;Assist for transportation;Help with stairs or ramp for entrance ? ?  ?Functional Status Assessment ? Patient has had a recent decline in their functional status and demonstrates the ability to make significant improvements in function in a reasonable and predictable amount of time.  ?Equipment Recommendations ? BSC/3in1  ?  ?Recommendations for Other Services   ? ? ?  ?Precautions / Restrictions Precautions ?Precautions: Back;Fall ?Precaution Booklet Issued: Yes (comment) ?Precaution Comments: Pt provided with handout and reviewed back precautions  ?Brace:  Back don while sitting  ? ?  ? ?Mobility Bed  Mobility ?Overal bed mobility: Needs Assistance ?Bed Mobility: Rolling, Sidelying to Sit ?Rolling: Min guard ?Sidelying to sit: Min guard ?  ?  ?  ?General bed mobility comments: Pt required mod cues and instruction to log roll ?  ? ?Transfers ?Overall transfer level: Needs assistance ?Equipment used: Rolling walker (2 wheels) ?Transfers: Sit to/from Stand, Bed to chair/wheelchair/BSC ?Sit to Stand: Min guard ?  ?  ?Step pivot transfers: Min guard ?  ?  ?General transfer comment: Pt requires repeated cues for hand placement ?  ? ?  ?Balance Overall balance assessment: Needs assistance ?Sitting-balance support: Feet supported ?Sitting balance-Leahy Scale: Good ?  ?  ?Standing balance support: During functional activity, No upper extremity supported ?Standing balance-Leahy Scale: Fair ?  ?  ?  ?  ?  ?  ?  ?  ?  ?  ?  ?  ?   ? ?ADL either performed or assessed with clinical judgement  ? ?ADL Overall ADL's : Needs assistance/impaired ?Eating/Feeding: Independent ?  ?Grooming: Wash/dry hands;Wash/dry face;Oral care;Brushing hair;Set up;Supervision/safety;Sitting ?  ?Upper Body Bathing: Supervision/ safety;Set up;Sitting ?  ?Lower Body Bathing: Moderate assistance;Sit to/from stand ?Lower Body Bathing Details (indicate cue type and reason): unable to perform figure 4. ?Upper Body Dressing : Minimal assistance;Sitting ?Upper Body Dressing Details (indicate cue type and reason): assist to pull shirt overhead ?Lower Body Dressing: Moderate assistance;Sit to/from stand ?Lower Body Dressing Details (indicate cue type and reason): pt unable to perform figure 4.  He initially stated that he will have friend assist him.   He was able to thread Rt LE through pant leg, but requires assist for Rt LE.  He later requested instruction on AE.  using AE, he is able  to complete LB ADLs with min - mod A ?Toilet Transfer: Minimal assistance;Ambulation;Rolling walker (2 wheels) ?Toilet Transfer Details (indicate cue type and reason): assit  for walker management ?Toileting- Clothing Manipulation and Hygiene: Minimal assistance;Sit to/from stand ?Toileting - Clothing Manipulation Details (indicate cue type and reason): assist to pull pants over hips ?  ?  ?Functional mobility during ADLs: Minimal assistance;Rolling walker (2 wheels) ?   ? ? ? ?Vision Patient Visual Report: No change from baseline ?   ?   ?Perception   ?  ?Praxis   ?  ? ?Pertinent Vitals/Pain Pain Assessment ?Pain Assessment: Faces ?Faces Pain Scale: Hurts little more ?Pain Location: back ?Pain Descriptors / Indicators: Aching ?Pain Intervention(s): Monitored during session  ? ? ? ?Hand Dominance Right ?  ?Extremity/Trunk Assessment Upper Extremity Assessment ?Upper Extremity Assessment: RUE deficits/detail;LUE deficits/detail;Generalized weakness ?RUE Deficits / Details: Pt with long standing limitations bil. shoulders ?LUE Deficits / Details: Pt with long standing limitations bil. shoulders ?  ?Lower Extremity Assessment ?Lower Extremity Assessment: Defer to PT evaluation ?  ?Cervical / Trunk Assessment ?Cervical / Trunk Assessment: Lordotic (flexed hips) ?  ?Communication Communication ?Communication: No difficulties ?  ?Cognition Arousal/Alertness: Awake/alert ?Behavior During Therapy: [facility] for tasks assessed/performed ?Overall Cognitive Status: Within Functional Limits for tasks assessed ?  ?  ?  ?  ?  ?  ?  ?  ?  ?  ?  ?  ?  ?  ?  ?  ?General Comments: Pt does require reinforcement with ADLs ?  ?  ?General Comments  Pt required mod assist to don brace ? ?  ?Exercises   ?  ?Shoulder Instructions    ? ? ?Home Living Family/patient expects to be discharged to:: Private residence ?Living Arrangements: Alone ?Available Help at Discharge: Friend(s);Available 24 hours/day ?Type of Home: House ?Home Access: Stairs to enter ?Entrance Stairs-Number of Steps: 2 ?Entrance Stairs-Rails: None ?Home Layout: One level ?  ?  ?Bathroom Shower/Tub: Walk-in shower ?  ?Bathroom Toilet:  Standard ?Bathroom Accessibility: Yes ?  ?Home Equipment: Shower seat ?  ?Additional Comments: Pt reports he will have 2 people assist him at discharge ?  ? ?  ?Prior Functioning/Environment Prior Level of Function : Needs assist ?  ?  ?  ?  ?  ?  ?  ?ADLs Comments: Pt reports intermittent assist for LB ADLs and IADLs ?  ? ?  ?  ?OT Problem List: Decreased activity tolerance;Impaired balance (sitting and/or standing);Decreased safety awareness;Decreased knowledge of use of DME or AE;Decreased knowledge of precautions;Obesity;Pain;Impaired UE functional use ?  ?   ?OT Treatment/Interventions:    ?  ?OT Goals(Current goals can be found in the care plan section) Acute Rehab OT Goals ?Patient Stated Goal: to get better ?OT Goal Formulation: All assessment and education complete, DC therapy  ?OT Frequency:   ?  ? ?Co-evaluation   ?  ?  ?  ?  ? ?  ?AM-PAC OT "6 Clicks" Daily Activity     ?Outcome Measure Help from another person eating meals?: None ?Help from another person taking care of personal grooming?: A Little ?Help from another person toileting, which includes using toliet, bedpan, or urinal?: A Little ?Help from another person bathing (including washing, rinsing, drying)?: A Lot ?Help from another person to put on and taking off regular upper body clothing?: A Little ?Help from another person to put on and taking off regular lower body clothing?: A Lot ?6 Click Score: 17 ?  ?End of Session  Equipment Utilized During Treatment: Back brace;Rolling walker (2 wheels) ?Nurse Communication: Mobility status ? ?Activity Tolerance: Patient tolerated treatment well ?Patient left: in bed;with family/visitor present (EOB) ? ?OT Visit Diagnosis: Unsteadiness on feet (R26.81);Pain ?Pain - part of body:  (back)  ?              ?Time: 4854-6270 ?OT Time Calculation (min): 47 min ?Charges:  OT General Charges ?$OT Visit: 1 Visit ?OT Evaluation ?$OT Eval Moderate Complexity: 1 Mod ?OT Treatments ?$Self Care/Home Management : 23-37  mins ? ?Tishina Lown C., OTR/L ?Acute Rehabilitation Services ?Pager [phone] ?Office [phone] ? ? ?Jeani Hawking M ?10/21/2021, 9:56 AM ?

## 2021-10-21 NOTE — TOC Transition Note (Signed)
Transition of Care (TOC) - CM/SW Discharge Note ? ? ?Patient Details  ?Name: Derek [name] ?MRN: [id number] ?Date of Birth: 08-Aug-1946 ? ?Transition of Care (TOC) CM/SW Contact:  ?Lawerance Sabal, RN ?Phone Number: ?10/21/2021, 9:32 AM ? ? ?Clinical Narrative:   Spoke with patient at bedside. He states that he would like home health services with provider he had in the past although he could not remember the name. Bamboo shows patient active with [facility] in November last year. Referral made to liaison, accepted.  ?Unit staff to provide needed DME. ?No other TOC needs identified.  ? ? ? ?Final next level of care: Home w Home Health Services ?Barriers to Discharge: No Barriers Identified ? ? ?Patient Goals and CMS Choice ?Patient states their goals for this hospitalization and ongoing recovery are:: to return home ?CMS Medicare.gov Compare Post Acute Care list provided to:: Patient ?Choice offered to / list presented to : Patient ? ?Discharge Placement ?  ?           ?  ?  ?  ?  ? ?Discharge Plan and Services ?  ?  ?           ?  ?  ?  ?  ?  ?HH Arranged: PT, RN ?HH Agency: [facility] ?Date HH Agency Contacted: 10/21/21 ?Time HH Agency Contacted: [phone] ?Representative spoke with at [facility] Agency: Kandee Keen ? ?Social Determinants of Health (SDOH) Interventions ?  ? ? ?Readmission Risk Interventions ?No flowsheet data found. ? ? ? ? ?

## 2021-10-21 NOTE — Progress Notes (Signed)
Patient alert and oriented, voiding adequately, skin clean, dry and intact without evidence of skin break down, or symptoms of complications - no redness or edema noted, only slight tenderness at site.  Patient states pain is manageable at time of discharge. Patient has an appointment with MD in 2 weeks 

## 2021-10-21 NOTE — Discharge Instructions (Signed)
Wound Care ?Change dressing as needed ?Leave incision open to air. ?You may shower. ?Do not scrub directly on incision.  ?Do not put any creams, lotions, or ointments on incision. ?Activity ?Walk each and every day, increasing distance each day. ?No lifting greater than 5 lbs.  Avoid bending, arching, and twisting. ?No driving for 2 weeks; may ride as a passenger locally. ?If provided with back brace, wear when out of bed.  It is not necessary to wear in bed. ?Diet ?Resume your normal diet.  ?Return to Work ?Will be discussed at you follow up appointment. ?Call Your Doctor If Any of These Occur ?Redness, drainage, or swelling at the wound.  ?Temperature greater than 101 degrees. ?Severe pain not relieved by pain medication. ?Incision starts to come apart. ?Follow Up Appt ?Call today for appointment in 2-3 weeks ([phone]) or for problems.  If you have any hardware placed in your spine, you will need an x-ray before your appointment.  ?

## 2021-10-21 NOTE — Discharge Summary (Signed)
Physician Discharge Summary  ?Patient ID: ?Derek [name] ?MRN: [id number] ?DOB/AGE: 01/24/1946 76 y.o. ? ?Admit date: 10/20/2021 ?Discharge date: 10/21/2021 ? ?Admission Diagnoses: L4-5 lumbar spondylolisthesis and stenosis ? ?Discharge Diagnoses: L4-5 lumbar spondylolisthesis and stenosis ?Principal Problem: ?  Lumbar stenosis ? ? ?Discharged Condition: good ? ?Hospital Course: The patient was admitted on 10/20/2021 and taken to the operating room where the patient underwent L4/5 TLIF. The patient tolerated the procedure well and was taken to the recovery room and then to the floor in stable condition. The hospital course was routine. There were no complications. The wound remained clean dry and intact. Pt had appropriate back soreness. No complaints of leg pain or new N/T/W. The patient remained afebrile with stable vital signs, and tolerated a regular diet. The patient continued to increase activities, and pain was well controlled with oral pain medications. ? ? ?Consults: None ? ?Significant Diagnostic Studies: radiology: X-Ray: intraoperative ? ? ?Treatments: surgery:  ?1.  L4-5 lumbar interbody fusion via bilateral transforaminal approach, including laminectomy, bilateral facetectomies and foraminotomies ?2.  L4-5 posterolateral arthrodesis ?3. Placement of interbody cages x2 at L4-5 ?4. Nonsegmental instrumentation with cortical pedicle screw and rod construct at L4-5 ?5. Harvest of local autograft ?6. Use of morselized allograft ? ?Discharge Exam: ?Blood pressure 104/73, pulse 66, temperature 98.4 ?F (36.9 ?C), temperature source Oral, resp. rate 18, height 5\' 6"  (1.676 m), weight 111.6 kg, SpO2 95 %. ?Physical Exam: Patient is awake, A/O X 4, conversant, and in good spirits. They are in NAD and VSS. Doing well. Speech is fluent and appropriate. MAEW with good strength that is symmetric bilaterally.  BUE 5/5 throughout, BLE 5/5 throughout except right EHL 4-/5 (chronic per patient). Sensation to light touch is  intact. PERLA, EOMI. CNs grossly intact. Dressing is clean dry intact. Incision is well approximated with no drainage, erythema, or edema. ? ? ? ? ?Disposition: Discharge disposition: 01-Home or Self Care ? ? ? ? ? ? ? ?Allergies as of 10/21/2021   ?No Known Allergies ?  ? ?  ?Medication List  ?  ? ?TAKE these medications   ? ?aspirin 325 MG EC tablet ?Take 325 mg by mouth every 6 (six) hours as needed for pain. ?  ?cetirizine 10 MG tablet ?Commonly known as: ZYRTEC ?Take 10 mg by mouth daily. ?  ?cholecalciferol 1000 units tablet ?Commonly known as: VITAMIN D ?Take 1,000 Units by mouth daily. ?  ?docusate sodium 100 MG capsule ?Commonly known as: COLACE ?Take 1 capsule (100 mg total) by mouth 2 (two) times daily. ?  ?methocarbamol 500 MG tablet ?Commonly known as: ROBAXIN ?Take 1 tablet (500 mg total) by mouth every 6 (six) hours as needed for muscle spasms. ?What changed: Another medication with the same name was added. Make sure you understand how and when to take each. ?  ?methocarbamol 500 MG tablet ?Commonly known as: Robaxin ?Take 1 tablet (500 mg total) by mouth 4 (four) times daily. ?What changed: You were already taking a medication with the same name, and this prescription was added. Make sure you understand how and when to take each. ?  ?omeprazole 20 MG capsule ?Commonly known as: PRILOSEC ?Take 20 mg by mouth daily before breakfast. ?  ?oxyCODONE-acetaminophen 5-325 MG tablet ?Commonly known as: Percocet ?Take 1-2 tablets by mouth every 4 (four) hours as needed for severe pain. ?What changed: how much to take ?  ?trolamine salicylate 10 % cream ?Commonly known as: ASPERCREME ?Apply 1 application topically as needed for muscle  pain. ?  ? ?  ? ? ? ?Signed: ?Marvis Moeller, DNP, NP-C ?10/21/2021, 8:30 AM ? ? ?

## 2021-10-21 NOTE — Progress Notes (Signed)
Neurosurgery ? ?Patient states he is doing well, leg pains resolved, back pain fairly manageable.  Has been walking with a walker. Will discharge today, f/u in clinic in 2 weeks. Cont LSO brace. ?

## 2021-10-21 NOTE — Evaluation (Signed)
Physical Therapy Evaluation ?Patient Details ?Name: Derek MiloCurtis [name] ?MRN: [id number] ?DOB: 10/25/1945 ?Today's Date: 10/21/2021 ? ?History of Present Illness ? 76 y/o male admitted on 10/20/21 following TLIF L4-5. PMH: hx of ACDF in 04/2021, obesit, s/p Lt TKA  ?Clinical Impression ? Patient admitted following above procedure. Patient presents with generalized weakness, decreased activity tolerance, impaired balance, and pain. Educated and reinforced back precautions, brace wear, and progressive walking program, patient verbalized understanding but did not demonstrate understanding. Patient requires min guard for functional mobility with RW. Educated friend about donning brace and back precaution, friend verbalized understanding. Patient will benefit from skilled PT services during acute stay to address listed deficits. Recommend HHPT at discharge to maximize functional mobility and safety in the home.  ?   ? ?Recommendations for follow up therapy are one component of a multi-disciplinary discharge planning process, led by the attending physician.  Recommendations may be updated based on patient status, additional functional criteria and insurance authorization. ? ?Follow Up Recommendations Home health PT ? ?  ?Assistance Recommended at Discharge Frequent or constant Supervision/Assistance  ?Patient can return home with the following ? A little help with walking and/or transfers;A little help with bathing/dressing/bathroom;Assistance with cooking/housework;Direct supervision/assist for medications management;Direct supervision/assist for financial management;Assist for transportation;Help with stairs or ramp for entrance ? ?  ?Equipment Recommendations Rolling Clance Baquero (2 wheels);BSC/3in1  ?Recommendations for Other Services ?    ?  ?Functional Status Assessment Patient has had a recent decline in their functional status and demonstrates the ability to make significant improvements in function in a reasonable and predictable  amount of time.  ? ?  ?Precautions / Restrictions Precautions ?Precautions: Back;Fall ?Precaution Booklet Issued: Yes (comment) ?Precaution Comments: Pt provided with handout and reviewed back precautions ?Required Braces or Orthoses: Spinal Brace ?Spinal Brace: Lumbar corset;Applied in sitting position ?Restrictions ?Weight Bearing Restrictions: No  ? ?  ? ?Mobility ? Bed Mobility ?  ?  ?  ?  ?  ?  ?  ?General bed mobility comments: sitting EOB on arrival and at end of session ?  ? ?Transfers ?Overall transfer level: Needs assistance ?Equipment used: Rolling Jozee Hammer (2 wheels) ?Transfers: Sit to/from Stand ?Sit to Stand: Min guard ?  ?  ?  ?  ?  ?General transfer comment: cues for hand placement and uses momentum to stand. Min guard for safety ?  ? ?Ambulation/Gait ?Ambulation/Gait assistance: Min guard ?Gait Distance (Feet): 75 Feet ?Assistive device: Rolling Ranisha Allaire (2 wheels) ?Gait Pattern/deviations: Step-to pattern, Decreased stride length, Decreased step length - left, Decreased dorsiflexion - left, Trunk flexed, Wide base of support ?Gait velocity: decreased ?Gait velocity interpretation: <1.31 ft/sec, indicative of household ambulator ?  ?General Gait Details: min guard for safety. Increased time and effort. Decreased foot clearance on L. Patient intermittently attempting upright posture but limited due to lordotic posture ? ?Stairs ?Stairs: Yes ?Stairs assistance: Min assist ?Stair Management: One rail Right, Step to pattern, Forwards ?Number of Stairs: 1 ?General stair comments: minA for balance and boost. Difficulty bringing L foot up on step ? ?Wheelchair Mobility ?  ? ?Modified Rankin (Stroke Patients Only) ?  ? ?  ? ?Balance Overall balance assessment: Needs assistance ?Sitting-balance support: Feet supported ?Sitting balance-Leahy Scale: Good ?  ?  ?Standing balance support: Bilateral upper extremity supported, Reliant on assistive device for balance ?Standing balance-Leahy Scale: Poor ?Standing  balance comment: reliant on RW ?  ?  ?  ?  ?  ?  ?  ?  ?  ?  ?  ?   ? ? ? ?  Pertinent Vitals/Pain Pain Assessment ?Pain Assessment: Faces ?Faces Pain Scale: Hurts little more ?Pain Location: back ?Pain Descriptors / Indicators: Aching ?Pain Intervention(s): Monitored during session  ? ? ?Home Living Family/patient expects to be discharged to:: Private residence ?Living Arrangements: Alone ?Available Help at Discharge: Friend(s);Available 24 hours/day ?Type of Home: House ?Home Access: Stairs to enter ?Entrance Stairs-Rails: None ?Entrance Stairs-Number of Steps: 2 ?  ?Home Layout: One level ?Home Equipment: Shower seat ?Additional Comments: Pt reports he will have 2 people assist him at discharge  ?  ?Prior Function Prior Level of Function : Needs assist ?  ?  ?  ?  ?  ?  ?  ?ADLs Comments: Pt reports intermittent assist for LB ADLs and IADLs ?  ? ? ?Hand Dominance  ? Dominant Hand: Right ? ?  ?Extremity/Trunk Assessment  ? Upper Extremity Assessment ?Upper Extremity Assessment: Defer to OT evaluation ?RUE Deficits / Details: Pt with long standing limitations bil. shoulders ?LUE Deficits / Details: Pt with long standing limitations bil. shoulders ?  ? ?Lower Extremity Assessment ?Lower Extremity Assessment: Generalized weakness ?  ? ?Cervical / Trunk Assessment ?Cervical / Trunk Assessment: Lordotic;Back Surgery  ?Communication  ? Communication: No difficulties  ?Cognition Arousal/Alertness: Awake/alert ?Behavior During Therapy: [facility] for tasks assessed/performed ?Overall Cognitive Status: Within Functional Limits for tasks assessed ?  ?  ?  ?  ?  ?  ?  ?  ?  ?  ?  ?  ?  ?  ?  ?  ?General Comments: Pt does require reinforcement with mobility ?  ?  ? ?  ?General Comments General comments (skin integrity, edema, etc.): Pt required mod assist to don brace ? ?  ?Exercises    ? ?Assessment/Plan  ?  ?PT Assessment Patient needs continued PT services  ?PT Problem List Decreased range of motion;Decreased balance;Decreased  strength;Decreased activity tolerance;Decreased mobility;Decreased safety awareness;Decreased knowledge of use of DME;Decreased cognition;Decreased knowledge of precautions ? ?   ?  ?PT Treatment Interventions DME instruction;Gait training;Stair training;Functional mobility training;Therapeutic activities;Balance training;Therapeutic exercise;Patient/family education   ? ?PT Goals (Current goals can be found in the Care Plan section)  ?Acute Rehab PT Goals ?Patient Stated Goal: to go home ?PT Goal Formulation: With patient ?Time For Goal Achievement: 11/04/21 ?Potential to Achieve Goals: Fair ? ?  ?Frequency Min 5X/week ?  ? ? ?Co-evaluation   ?  ?  ?  ?  ? ? ?  ?AM-PAC PT "6 Clicks" Mobility  ?Outcome Measure Help needed turning from your back to your side while in a flat bed without using bedrails?: A Little ?Help needed moving from lying on your back to sitting on the side of a flat bed without using bedrails?: A Little ?Help needed moving to and from a bed to a chair (including a wheelchair)?: A Little ?Help needed standing up from a chair using your arms (e.g., wheelchair or bedside chair)?: A Little ?Help needed to walk in hospital room?: A Little ?Help needed climbing 3-5 steps with a railing? : A Little ?6 Click Score: 18 ? ?  ?End of Session Equipment Utilized During Treatment: Back brace ?Activity Tolerance: Patient tolerated treatment well ?Patient left: in bed;with call bell/phone within reach;with family/visitor present ?Nurse Communication: Mobility status ?PT Visit Diagnosis: Unsteadiness on feet (R26.81);Muscle weakness (generalized) (M62.81);Difficulty in walking, not elsewhere classified (R26.2) ?  ? ?Time: 2671-2458 ?PT Time Calculation (min) (ACUTE ONLY): 18 min ? ? ?Charges:   PT Evaluation ?$PT Eval Moderate Complexity: 1 Mod ?  ?  ?   ? ? ?  Sharol Croghan A. Dan Humphreys, PT, DPT ?Acute Rehabilitation Services ?Pager [phone] ?Office [phone] ? ? ?Pranshu Lyster A Cashmere Dingley ?10/21/2021, 10:29 AM ? ?

## 2021-10-22 ENCOUNTER — Encounter (HOSPITAL_COMMUNITY): Payer: Self-pay | Admitting: Neurosurgery

## 2021-12-22 ENCOUNTER — Ambulatory Visit (INDEPENDENT_AMBULATORY_CARE_PROVIDER_SITE_OTHER): Payer: Medicare HMO

## 2021-12-22 VITALS — Wt 246.0 lb

## 2021-12-22 DIAGNOSIS — Z602 Problems related to living alone: Secondary | ICD-10-CM | POA: Diagnosis not present

## 2021-12-22 DIAGNOSIS — Z0000 Encounter for general adult medical examination without abnormal findings: Secondary | ICD-10-CM | POA: Diagnosis not present

## 2021-12-22 DIAGNOSIS — Z5941 Food insecurity: Secondary | ICD-10-CM

## 2021-12-22 DIAGNOSIS — M4316 Spondylolisthesis, lumbar region: Secondary | ICD-10-CM | POA: Insufficient documentation

## 2021-12-22 DIAGNOSIS — Z599 Problem related to housing and economic circumstances, unspecified: Secondary | ICD-10-CM

## 2021-12-22 NOTE — Patient Instructions (Signed)
Derek [name] , ?Thank you for taking time to come for your Medicare Wellness Visit. I appreciate your ongoing commitment to your health goals. Please review the following plan we discussed and let me know if I can assist you in the future.  ? ?Screening recommendations/referrals: ?Colonoscopy: Done 02/29/2012 - Repeat in 10 years *due this year at [facility] ?Recommended yearly ophthalmology/optometry visit for glaucoma screening and checkup ?Recommended yearly dental visit for hygiene and checkup ? ?Vaccinations: ?[facility] ?Pneumococcal vaccine: Done 05/25/2019 & 11/17/2020 ?Tdap vaccine: Done 09/29/2011 - Repeat in 10 years *due ?[facility]   ?Covid-19:  Done  09/20/2019, 10/18/2019, & 04/25/2020 - had one more dose - we need date ? ?Advanced directives: Please bring a copy of your health care power of attorney and living will to the office to be added to your chart at your convenience.  ? ?Conditions/risks identified: Aim for 30 minutes of exercise or brisk walking, 6-8 glasses of water, and 5 servings of fruits and vegetables each day.  ? ?Next appointment: Follow up in one year for your annual wellness visit.  ? ?Preventive Care 2565 Years and Older, Male ? ?Preventive care refers to lifestyle choices and visits with your health care provider that can promote health and wellness. ?What does preventive care include? ?A yearly physical exam. This is also called an annual well check. ?Dental exams once or twice a year. ?Routine eye exams. Ask your health care provider how often you should have your eyes checked. ?Personal lifestyle choices, including: ?Daily care of your teeth and gums. ?Regular physical activity. ?Eating a healthy diet. ?Avoiding tobacco and drug use. ?Limiting alcohol use. ?Practicing safe sex. ?Taking low doses of aspirin every day. ?Taking vitamin and mineral supplements as recommended by your health care provider. ?What happens during an annual well check? ?The services and  screenings done by your health care provider during your annual well check will depend on your age, overall health, lifestyle risk factors, and family history of disease. ?Counseling  ?Your health care provider may ask you questions about your: ?Alcohol use. ?Tobacco use. ?Drug use. ?Emotional well-being. ?Home and relationship well-being. ?Sexual activity. ?Eating habits. ?History of falls. ?Memory and ability to understand (cognition). ?Work and work Astronomerenvironment. ?Screening  ?You may have the following tests or measurements: ?Height, weight, and BMI. ?Blood pressure. ?Lipid and cholesterol levels. These may be checked every 5 years, or more frequently if you are over 74 years old. ?Skin check. ?Lung cancer screening. You may have this screening every year starting at age 74 if you have a 30-pack-year history of smoking and currently smoke or have quit within the past 15 years. ?Fecal occult blood test (FOBT) of the stool. You may have this test every year starting at age 74. ?Flexible sigmoidoscopy or colonoscopy. You may have a sigmoidoscopy every 5 years or a colonoscopy every 10 years starting at age 11050. ?Prostate cancer screening. Recommendations will vary depending on your family history and other risks. ?Hepatitis C blood test. ?Hepatitis B blood test. ?Sexually transmitted disease (STD) testing. ?Diabetes screening. This is done by checking your blood sugar (glucose) after you have not eaten for a while (fasting). You may have this done every 1-3 years. ?Abdominal aortic aneurysm (AAA) screening. You may need this if you are a current or former smoker. ?Osteoporosis. You may be screened starting at age 74 if you are at high risk. ?Talk with your health care provider about your test results, treatment  options, and if necessary, the need for more tests. ?Vaccines  ?Your health care provider may recommend certain vaccines, such as: ?Influenza vaccine. This is recommended every year. ?Tetanus, diphtheria, and  acellular pertussis (Tdap, Td) vaccine. You may need a Td booster every 10 years. ?Zoster vaccine. You may need this after age 27. ?Pneumococcal 13-valent conjugate (PCV13) vaccine. One dose is recommended after age 47. ?Pneumococcal polysaccharide (PPSV23) vaccine. One dose is recommended after age 65. ?Talk to your health care provider about which screenings and vaccines you need and how often you need them. ?This information is not intended to replace advice given to you by your health care provider. Make sure you discuss any questions you have with your health care provider. ?Document Released: 08/29/2015 Document Revised: 04/21/2016 Document Reviewed: 06/03/2015 ?Elsevier Interactive Patient Education ? 2017 [employer] ? ?Fall Prevention in the Home ?Falls can cause injuries. They can happen to people of all ages. There are many things you can do to make your home safe and to help prevent falls. ?What can I do on the outside of my home? ?Regularly fix the edges of walkways and driveways and fix any cracks. ?Remove anything that might make you trip as you walk through a door, such as a raised step or threshold. ?Trim any bushes or trees on the path to your home. ?Use bright outdoor lighting. ?Clear any walking paths of anything that might make someone trip, such as rocks or tools. ?Regularly check to see if handrails are loose or broken. Make sure that both sides of any steps have handrails. ?Any raised decks and porches should have guardrails on the edges. ?Have any leaves, snow, or ice cleared regularly. ?Use sand or salt on walking paths during winter. ?Clean up any spills in your garage right away. This includes oil or grease spills. ?What can I do in the bathroom? ?Use night lights. ?Install grab bars by the toilet and in the tub and shower. Do not use towel bars as grab bars. ?Use non-skid mats or decals in the tub or shower. ?If you need to sit down in the shower, use a plastic, non-slip stool. ?Keep  the floor dry. Clean up any water that spills on the floor as soon as it happens. ?Remove soap buildup in the tub or shower regularly. ?Attach bath mats securely with double-sided non-slip rug tape. ?Do not have throw rugs and other things on the floor that can make you trip. ?What can I do in the bedroom? ?Use night lights. ?Make sure that you have a light by your bed that is easy to reach. ?Do not use any sheets or blankets that are too big for your bed. They should not hang down onto the floor. ?Have a firm chair that has side arms. You can use this for support while you get dressed. ?Do not have throw rugs and other things on the floor that can make you trip. ?What can I do in the kitchen? ?Clean up any spills right away. ?Avoid walking on wet floors. ?Keep items that you use a lot in easy-to-reach places. ?If you need to reach something above you, use a strong step stool that has a grab bar. ?Keep electrical cords out of the way. ?Do not use floor polish or wax that makes floors slippery. If you must use wax, use non-skid floor wax. ?Do not have throw rugs and other things on the floor that can make you trip. ?What can I do with my stairs? ?Do not  leave any items on the stairs. ?Make sure that there are handrails on both sides of the stairs and use them. Fix handrails that are broken or loose. Make sure that handrails are as long as the stairways. ?Check any carpeting to make sure that it is firmly attached to the stairs. Fix any carpet that is loose or worn. ?Avoid having throw rugs at the top or bottom of the stairs. If you do have throw rugs, attach them to the floor with carpet tape. ?Make sure that you have a light switch at the top of the stairs and the bottom of the stairs. If you do not have them, ask someone to add them for you. ?What else can I do to help prevent falls? ?Wear shoes that: ?Do not have high heels. ?Have rubber bottoms. ?Are comfortable and fit you well. ?Are closed at the toe. Do not  wear sandals. ?If you use a stepladder: ?Make sure that it is fully opened. Do not climb a closed stepladder. ?Make sure that both sides of the stepladder are locked into place. ?Ask someone to hold it fo

## 2021-12-22 NOTE — Progress Notes (Signed)
? ?Subjective:  ? Derek [name] is a 76 y.o. male who presents for Medicare Annual/Subsequent preventive examination. ? ?Virtual Visit via Telephone Note ? ?I connected with  Derek [name] on 12/22/21 at  9:00 AM EDT by telephone and verified that I am speaking with the correct person using two identifiers. ? ?Location: ?Patient: Home ?Provider: [facility] ?Persons participating in the virtual visit: patient/Nurse Health Advisor ?  ?I discussed the limitations, risks, security and privacy concerns of performing an evaluation and management service by telephone and the availability of in person appointments. The patient expressed understanding and agreed to proceed. ? ?Interactive audio and video telecommunications were attempted between this nurse and patient, however failed, due to patient having technical difficulties OR patient did not have access to video capability.  We continued and completed visit with audio only. ? ?Some vital signs may be absent or patient reported.  ? ?Derek [name] Derek Ano, LPN  ? ?Review of Systems    ? ?Cardiac Risk Factors include: advanced age (>26men, >29 women);obesity (BMI >30kg/m2);male gender ? ?   ?Objective:  ?  ?Today's Vitals  ? 12/22/21 0853  ?Weight: 246 lb (111.6 kg)  ?PainSc: 3   ? ?Body mass index is 39.71 kg/m?. ? ? ?  12/22/2021  ?  9:03 AM 10/16/2021  ?  2:38 PM 05/04/2021  ? 12:24 PM 03/27/2021  ?  2:44 PM 12/19/2020  ?  1:16 PM 08/05/2020  ?  4:25 PM 07/22/2020  ?  8:17 AM  ?Advanced Directives  ?Does Patient Have a Medical Advance Directive? Yes No No No No No No  ?Type of Paramedic of [place];Living will        ?Copy of [place] in Chart? No - copy requested        ?Would patient like information on creating a medical advance directive?  Yes (MAU/Ambulatory/Procedural Areas - Information given) No - Patient declined  Yes (MAU/Ambulatory/Procedural Areas - Information given) No - Patient declined No - Patient declined  ? ? ?Current  Medications (verified) ?Outpatient Encounter Medications as of 12/22/2021  ?Medication Sig  ? aspirin 325 MG EC tablet Take 325 mg by mouth every 6 (six) hours as needed for pain.  ? cetirizine (ZYRTEC) 10 MG tablet Take 10 mg by mouth daily.  ? cholecalciferol (VITAMIN D) 1000 UNITS tablet Take 1,000 Units by mouth daily.  ? docusate sodium (COLACE) 100 MG capsule Take 1 capsule (100 mg total) by mouth 2 (two) times daily.  ? methocarbamol (ROBAXIN) 500 MG tablet Take 500 mg by mouth daily.  ? omeprazole (PRILOSEC) 20 MG capsule Take 20 mg by mouth daily before breakfast.  ? oxyCODONE-acetaminophen (PERCOCET) 5-325 MG tablet Take 1-2 tablets by mouth every 4 (four) hours as needed for severe pain.  ? trolamine salicylate (ASPERCREME) 10 % cream Apply 1 application topically as needed for muscle pain.  ? [DISCONTINUED] methocarbamol (ROBAXIN) 500 MG tablet Take 1 tablet (500 mg total) by mouth every 6 (six) hours as needed for muscle spasms. (Patient not taking: Reported on 10/13/2021)  ? [DISCONTINUED] methocarbamol (ROBAXIN) 500 MG tablet Take 1 tablet (500 mg total) by mouth 4 (four) times daily.  ? [DISCONTINUED] methocarbamol (ROBAXIN) 750 MG tablet Take by mouth.  ? ?No facility-administered encounter medications on file as of 12/22/2021.  ? ? ?Allergies (verified) ?Patient has no known allergies.  ? ?History: ?Past Medical History:  ?Diagnosis Date  ? Arthritis   ? left knee  ? GERD (gastroesophageal reflux  disease)   ? Hypercholesterolemia   ? Obesity   ? Urethritis   ? when he was in [employer]  ? ?Past Surgical History:  ?Procedure Laterality Date  ? ANTERIOR CERVICAL DECOMP/DISCECTOMY FUSION N/A 05/06/2021  ? Procedure: Anterior Cervical Discectomy Fusion Cervical Three-Four, Cervical Four-Five;  Surgeon: Vallarie Mare, MD;  Location: [facility];  Service: Neurosurgery;  Laterality: N/A;  ? COLONOSCOPY    ? TOTAL KNEE ARTHROPLASTY Left 07/22/2020  ? Procedure: LEFT TOTAL KNEE ARTHROPLASTY;  Surgeon: Mcarthur Rossetti, MD;  Location: [facility];  Service: Orthopedics;  Laterality: Left;  ? TRANSFORAMINAL LUMBAR INTERBODY FUSION (TLIF) WITH PEDICLE SCREW FIXATION 1 LEVEL N/A 10/20/2021  ? Procedure: Transforaminal Lumbar Interbody Fusion Lumbar Four-Five;  Surgeon: Vallarie Mare, MD;  Location: [facility];  Service: Neurosurgery;  Laterality: N/A;  Transforaminal Lumbar Interbody Fusion Lumbar Four-Five  ? ?Family History  ?Problem Relation Age of Onset  ? Cancer Mother   ?     breast  ? Diabetes Father   ? ?Social History  ? ?Socioeconomic History  ? Marital status: Legally Separated  ?  Spouse name: Not on file  ? Number of children: 2  ? Years of education: graduated high school  ? Highest education level: High school graduate  ?Occupational History  ? Occupation: retired  ?Tobacco Use  ? Smoking status: Never  ? Smokeless tobacco: Never  ?Vaping Use  ? Vaping Use: Never used  ?Substance and Sexual Activity  ? Alcohol use: No  ?  Alcohol/week: 0.0 standard drinks  ? Drug use: No  ? Sexual activity: Yes  ?  Birth control/protection: Condom  ?Other Topics Concern  ? Not on file  ?Social History Narrative  ? 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  ? Right handed  ? ?Social Determinants of Health  ? ?Financial Resource Strain: Medium Risk  ? Difficulty of Paying Living Expenses: Somewhat hard  ?Food Insecurity: Food Insecurity Present  ? Worried About Charity fundraiser in the Last Year: Sometimes true  ? Ran Out of Food in the Last Year: Sometimes true  ?Transportation Needs: No Transportation Needs  ? Lack of Transportation (Medical): No  ? Lack of Transportation (Non-Medical): No  ?Physical Activity: Sufficiently Active  ? Days of Exercise per Week: 7 days  ? Minutes of Exercise per Session: 30 min  ?Stress: No Stress Concern Present  ? Feeling of Stress : Not at all  ?Social Connections: Moderately Integrated  ? Frequency of Communication with Friends and Family: More than three  times a week  ? Frequency of Social Gatherings with Friends and Family: More than three times a week  ? Attends Religious Services: More than 4 times per year  ? Active Member of Clubs or Organizations: Yes  ? Attends Archivist Meetings: More than 4 times per year  ? Marital Status: Separated  ? ? ?Tobacco Counseling ?Counseling given: Not Answered ? ? ?Clinical Intake: ? ?Pre-visit preparation completed: Yes ? ?Pain : 0-10 ?Pain Score: 3  ?Pain Type: Chronic pain ?Pain Location: Back ?Pain Orientation: Lower ?Pain Descriptors / Indicators: Aching ?Pain Onset: More than a month ago ? ?  ? ?BMI - recorded: 39.71 ?Nutritional Status: BMI > 30  Obese ?Nutritional Risks: None ?Diabetes: No ? ?How often do you need to have someone help you when you read instructions, pamphlets, or other written materials from your doctor or pharmacy?: 1 - Never ? ?Diabetic?  no ? ?Interpreter Needed?: No ? ?Information entered by :: Secily Walthour, LPN ? ? ?Activities of Daily Living ? ?  12/22/2021  ?  9:26 AM 10/16/2021  ?  2:43 PM  ?In your present state of health, do you have any difficulty performing the following activities:  ?Hearing? 1   ?Vision? 0   ?Difficulty concentrating or making decisions? 0   ?Walking or climbing stairs? 0   ?Dressing or bathing? 0   ?Doing errands, shopping? 0 0  ?Preparing Food and eating ? N   ?Using the Toilet? N   ?In the past six months, have you accidently leaked urine? N   ?Do you have problems with loss of bowel control? N   ?Managing your Medications? N   ?Managing your Finances? N   ?Housekeeping or managing your Housekeeping? N   ? ? ?Patient Care Team: ?Dettinger, Fransisca Kaufmann, MD as PCP - General (Family Medicine) ?Vallarie Mare, MD as Consulting Physician (Neurosurgery) ? ?Indicate any recent Medical Services you may have received from other than [facility] providers in the past year (date may be approximate). ? ?   ?Assessment:  ? This is a routine wellness examination for  [facility]. ? ?Hearing/Vision screen ?Hearing Screening - Comments:: Has hearing aid in right ear only - from [place] ?Vision Screening - Comments:: Mild vision concerns - no optometrist at this time - declines referral  ? ?Dietary issu

## 2021-12-28 ENCOUNTER — Telehealth: Payer: Self-pay | Admitting: *Deleted

## 2021-12-28 NOTE — Telephone Encounter (Signed)
? ?  Telephone encounter was:  Unsuccessful.  12/28/2021 ?Name: Gregery Walberg MRN: [id number] DOB: [birth year] ? ?Unsuccessful outbound call made today to assist with:  Food Insecurity ? ?Outreach Attempt   1st. ?Mailbox is full ?Orlando Devereux Greenauer -Berneda Rose ?Care Guide , Embedded Care Coordination ?[facility], Care Management  ?[phone] ?[address] [place] , [place] [address] ?Email : [email or web address] ?  ?

## 2022-01-01 ENCOUNTER — Telehealth: Payer: Self-pay | Admitting: *Deleted

## 2022-01-01 NOTE — Telephone Encounter (Signed)
   Telephone encounter was:  Successful.  01/01/2022 Name: Derek [name] MRN: [id number] DOB: 1945-09-14  [name] is a 76 y.o. year old male who is a primary care patient of Derek [name], Derek Radon, MD . The community resource team was consulted for assistance with Food Insecurity, Home Modifications, and Caregiver Stress  Care guide performed the following interventions:  Sent a letter .  Follow Up Plan:  Care guide will follow up with patient by phone over the next week  Alois Cliche -[facility] Guide , Embedded Care Coordination [facility], Care Management  [phone] [address] [place] , [place] [address] Email : [email or web address]

## 2022-01-01 NOTE — Telephone Encounter (Signed)
   Telephone encounter was:  Successful.  01/01/2022 Name: Derek [name] MRN: [id number] DOB: 1946-03-30  Derek [name] is a 76 y.o. year old male who is a primary care patient of Dettinger, Elige Radon, MD . The community resource team was consulted for assistance with Food Insecurity, Home Modifications, Caregiver Stress, and Financial Difficulties related to mortgage , utilities and taxes  Care guide performed the following interventions: Patient has alot going on behind on taxes and mortgage as well high utility and other issues , completed [facility] referral and also sent all the information via mail.  Follow Up Plan:  Care guide will follow up with patient by phone over the next week  Alois Cliche -[facility] Guide , Embedded Care Coordination [facility], Care Management  [phone] [address] [place] , [place] [address] Email : [email or web address]

## 2022-01-05 ENCOUNTER — Telehealth: Payer: Self-pay | Admitting: *Deleted

## 2022-01-05 NOTE — Telephone Encounter (Signed)
   Telephone encounter was:  Unsuccessful.  01/05/2022 Name: Derek [name] MRN: [id number] DOB: [birth year]  Unsuccessful outbound call made today to assist with:  Food Insecurity  Outreach Attempt:  3rd Attempt.  Referral closed unable to contact patient.  A HIPAA compliant voice message was left requesting a return call.  Instructed patient to call back at   Instructed patient to call back at [phone]  at their earliest convenience.   Alois Cliche -[facility] Guide , Embedded Care Coordination [facility], Care Management  [phone] [address] [place] , [place] [address] Email : [email or web address]

## 2022-01-14 ENCOUNTER — Telehealth: Payer: Self-pay | Admitting: *Deleted

## 2022-01-14 NOTE — Telephone Encounter (Signed)
   Telephone encounter was:  Successful.  01/14/2022 Name: Derek [name] MRN: [id number] DOB: Apr 03, 1946  Derek [name] is a 76 y.o. year old male who is a primary care patient of Dettinger, Elige Radon, MD . The community resource team was consulted for assistance with Food Insecurity and Home Modifications  Care guide performed the following interventions: Patient provided with information about care guide support team and interviewed to confirm resource needs Follow up call placed to the patient to discuss status of referral.Follow up call to see if patient has received informationmailed to him and he has  Follow Up Plan:  No further follow up planned at this time. The patient has been provided with needed resources.  Alois Cliche -[facility] Guide , Embedded Care Coordination [facility], Care Management  [phone] [address] [place] , [place] [address] Email : [email or web address]

## 2022-02-22 ENCOUNTER — Encounter: Payer: Self-pay | Admitting: Nurse Practitioner

## 2022-02-22 ENCOUNTER — Ambulatory Visit (INDEPENDENT_AMBULATORY_CARE_PROVIDER_SITE_OTHER): Payer: Medicare HMO | Admitting: Nurse Practitioner

## 2022-02-22 VITALS — BP 97/61 | HR 66 | Temp 98.4°F | Ht 66.0 in | Wt 240.0 lb

## 2022-02-22 DIAGNOSIS — Z113 Encounter for screening for infections with a predominantly sexual mode of transmission: Secondary | ICD-10-CM | POA: Diagnosis not present

## 2022-02-22 DIAGNOSIS — A64 Unspecified sexually transmitted disease: Secondary | ICD-10-CM

## 2022-02-22 DIAGNOSIS — R300 Dysuria: Secondary | ICD-10-CM

## 2022-02-22 LAB — URINALYSIS, ROUTINE W REFLEX MICROSCOPIC
Bilirubin, UA: NEGATIVE
Glucose, UA: NEGATIVE
Ketones, UA: NEGATIVE
Nitrite, UA: NEGATIVE
Protein,UA: NEGATIVE
RBC, UA: NEGATIVE
Specific Gravity, UA: 1.015 (ref 1.005–1.030)
Urobilinogen, Ur: 1 mg/dL (ref 0.2–1.0)
pH, UA: 5.5 (ref 5.0–7.5)

## 2022-02-22 LAB — MICROSCOPIC EXAMINATION
Epithelial Cells (non renal): NONE SEEN /hpf (ref 0–10)
RBC, Urine: NONE SEEN /hpf (ref 0–2)

## 2022-02-22 MED ORDER — CEPHALEXIN 500 MG PO CAPS: 500.0000 mg | ORAL_CAPSULE | Freq: Two times a day (BID) | ORAL | 0 refills | Status: DC

## 2022-02-22 NOTE — Patient Instructions (Signed)
Chlamydia, Male Chlamydia is a sexually transmitted infection (STI). This infection spreads through sexual contact. Chlamydia can occur in different areas of the body, including: The urethra. This is the part of the body that drains pee (urine) from the bladder and through the penis. The throat. The opening of the butt (rectum). This condition is not hard to treat. But if it is not treated, it can cause worse health problems. What are the causes? This condition is caused by a germ (bacteria) called Chlamydia trachomatis. These germs are spread from an infected partner during sex. The infection can spread through contact with the genitals, mouth, or the opening of the butt. What increases the risk? Not using a condom the right way. Not using a condom every time you have sex. Having a new sex partner. Having more than one sex partner. Being a man who has sex with men. Being sexually active before age 25. What are the signs or symptoms? In some cases, there are no symptoms, especially early in the illness. If you get symptoms, they may include: Peeing often, or a burning feeling when you pee. Pain or swelling in the testicles. Watery, mucus-like fluid (discharge) from the penis. Redness, soreness, swelling, or bleeding, from the butt. Fluid coming from the penis or butt. Pain in the belly (abdomen). Pain during sex. How is this treated? This condition is treated with antibiotic medicines. Follow these instructions at home: Sexual activity Tell your sex partner or partners about your infection. Sex partners are people you had oral, anal, or vaginal sex with within 60 days of when you started getting sick. They need treatment even if they do not feel or seem sick. Do not have sex until: You and your sex partners have been treated. Your doctor says it is okay. If you get just one dose of medicine, wait at least 7 days before having sex. General instructions Take over-the-counter and  prescription medicines as told by your doctor. Finish your antibiotics even if you start to feel better. It is up to you to get your test results. Ask how to get your results when they are ready. Keep all follow-up visits. You may need tests after 3 months. How is this prevented? To lower your risk: Use latex or polyurethane condoms the right way. Do this every time you have sex. Do not have many sex partners. Ask if your sex partner got tested for STIs and had negative results. Get regular health screenings to check for STIs. Contact a doctor if: You get new symptoms. Your symptoms are getting worse or do not get better with treatment. You have a fever or chills. You have pain during sex. You have pain or soreness in your testicles. You get flu-like symptoms. These may be: Night sweats. Sore throat. Muscle aches. You are not able to take your antibiotic as told. Summary Chlamydia is an infection that spreads through sexual contact. This condition is treated with antibiotics. If it is not treated, it can cause health problems. Your sex partners will also need to be treated. Do not have sex until both you and your partner have been treated. Take all medicines as told and keep all follow-up visits. This information is not intended to replace advice given to you by your health care provider. Make sure you discuss any questions you have with your health care provider. Document Revised: 05/11/2021 Document Reviewed: 05/11/2021 Elsevier Patient Education  2023 Elsevier Inc.  

## 2022-02-22 NOTE — Progress Notes (Signed)
Acute Office Visit  Subjective:     Patient ID: Derek [name], male    DOB: [birth year], 76 y.o.   MRN: [id number]  Chief Complaint  Patient presents with   Groin Swelling    Dysuria  This is a new problem. The current episode started in the past 7 days. The problem has been unchanged. The quality of the pain is described as burning. The patient is experiencing no pain. There has been no fever. He is Sexually active. There is No history of pyelonephritis. Associated symptoms include a discharge. Pertinent negatives include no chills, flank pain, frequency, hematuria, nausea or urgency. He has tried nothing for the symptoms.  Exposure to STD The patient's primary symptoms include genital itching and penile discharge. The patient's pertinent negatives include no genital injury, genital lesions or pelvic pain. The problem occurs constantly. The problem has been unchanged. The pain is mild. Associated symptoms include dysuria and a rash. Pertinent negatives include no chills, fever, flank pain, frequency, headaches, nausea or urgency. The penile discharge was Yellow. Genital lesion characteristics: wound on head of penis. He has tried nothing for the symptoms. It is unknown whether or not his partner has an STD.    Review of Systems  Constitutional:  Negative for chills and fever.  HENT: Negative.    Respiratory: Negative.    Cardiovascular: Negative.   Gastrointestinal:  Negative for nausea.  Genitourinary:  Positive for dysuria and penile discharge. Negative for flank pain, frequency, hematuria, pelvic pain and urgency.  Skin:  Positive for rash.  Neurological:  Negative for headaches.  All other systems reviewed and are negative.       Objective:    BP 97/61   Pulse 66   Temp 98.4 F (36.9 C)   Ht 5\' 6"  (1.676 m)   Wt 240 lb (108.9 kg)   SpO2 96%   BMI 38.74 kg/m  BP Readings from Last 3 Encounters:  02/22/22 97/61  10/21/21 104/73  10/16/21 125/64   Wt Readings from  Last 3 Encounters:  02/22/22 240 lb (108.9 kg)  12/22/21 246 lb (111.6 kg)  10/20/21 246 lb (111.6 kg)      Physical Exam Vitals and nursing note reviewed.  Constitutional:      Appearance: Normal appearance.  HENT:     Head: Normocephalic.     Right Ear: External ear normal.     Left Ear: External ear normal.     Nose: Nose normal.  Cardiovascular:     Rate and Rhythm: Normal rate and regular rhythm.     Pulses: Normal pulses.     Heart sounds: Normal heart sounds.  Pulmonary:     Effort: Pulmonary effort is normal.     Breath sounds: Normal breath sounds.  Abdominal:     General: Bowel sounds are normal.     Tenderness: There is no right CVA tenderness, left CVA tenderness or guarding.  Genitourinary:    Penis: Circumcised. Tenderness present.      Testes: Normal.       Comments: Purulent drainage form wound on head of penis Musculoskeletal:        General: Normal range of motion.  Skin:    General: Skin is warm.     Findings: No erythema or rash.  Neurological:     General: No focal deficit present.     Mental Status: He is alert and oriented to person, place, and time.  Psychiatric:        Behavior: Behavior  normal.     No results found for any visits on 02/22/22.      Assessment & Plan:  Completed urinalysis results pending.   Appears well, in no apparent distress.  Vital signs are normal. The abdomen is soft without tenderness, guarding, mass, rebound or organomegaly. No CVA tenderness or inguinal adenopathy noted. Urine dipstick shows positive for leukocytes.  Micro exam: few+ bacteria.   UTI uncomplicated without evidence of pyelonephritis -Keflex 500 mg tablet by mouth twice daily -Cultures pending. -STD labs pending.  PLAN: Treatment per orders - also push fluids, may use Pyridium OTC prn. Call or return to clinic prn if these symptoms worsen or fail to improve as anticipated.     Problem List Items Addressed This Visit   None Visit Diagnoses      STI (sexually transmitted infection)    -  Primary   Relevant Medications   cephALEXin (KEFLEX) 500 MG capsule   Other Relevant Orders   Ct, Ng, Mycoplasmas NAA, Urine   Urinalysis, Routine w reflex microscopic   CULTURE, URINE COMPREHENSIVE   Dysuria       Relevant Medications   cephALEXin (KEFLEX) 500 MG capsule       Meds ordered this encounter  Medications   cephALEXin (KEFLEX) 500 MG capsule    Sig: Take 1 capsule (500 mg total) by mouth 2 (two) times daily.    Dispense:  20 capsule    Refill:  0    Order Specific Question:   Supervising Provider    Answer:   Mechele Claude [[id number]]    Return if symptoms worsen or fail to improve.  Daryll Drown, NP

## 2022-02-24 ENCOUNTER — Other Ambulatory Visit: Payer: Self-pay | Admitting: Nurse Practitioner

## 2022-02-24 ENCOUNTER — Telehealth: Payer: Self-pay | Admitting: Family Medicine

## 2022-02-24 MED ORDER — BUTENAFINE HCL 1 % EX CREA
30.0000 g | TOPICAL_CREAM | Freq: Two times a day (BID) | CUTANEOUS | 0 refills | Status: DC
Start: 2022-02-24 — End: 2023-09-26

## 2022-02-24 NOTE — Telephone Encounter (Signed)
Lotrimin is over the counter but I just sent it in as  a prescription. Please advice patient to finish his antibiotics even if he feels better.

## 2022-02-25 LAB — CULTURE, URINE COMPREHENSIVE

## 2022-02-25 LAB — CT, NG, MYCOPLASMAS NAA, URINE
Chlamydia trachomatis, NAA: POSITIVE — AB
Mycoplasma genitalium NAA: POSITIVE — AB
Mycoplasma hominis NAA: NEGATIVE
Neisseria gonorrhoeae, NAA: NEGATIVE
Ureaplasma spp NAA: POSITIVE — AB

## 2022-02-26 ENCOUNTER — Other Ambulatory Visit: Payer: Self-pay | Admitting: Nurse Practitioner

## 2022-02-26 DIAGNOSIS — R8279 Other abnormal findings on microbiological examination of urine: Secondary | ICD-10-CM

## 2022-02-26 MED ORDER — NITROFURANTOIN MONOHYD MACRO 100 MG PO CAPS: 100.0000 mg | ORAL_CAPSULE | Freq: Two times a day (BID) | ORAL | 0 refills | Status: DC

## 2022-02-26 NOTE — Telephone Encounter (Signed)
Patient aware.

## 2022-03-01 ENCOUNTER — Other Ambulatory Visit: Payer: Self-pay | Admitting: Nurse Practitioner

## 2022-03-01 ENCOUNTER — Telehealth: Payer: Self-pay | Admitting: *Deleted

## 2022-03-01 DIAGNOSIS — A749 Chlamydial infection, unspecified: Secondary | ICD-10-CM

## 2022-03-01 MED ORDER — DOXYCYCLINE HYCLATE 100 MG PO TABS: 100.0000 mg | ORAL_TABLET | Freq: Two times a day (BID) | ORAL | 0 refills | Status: DC

## 2022-03-01 NOTE — Telephone Encounter (Signed)
I started by treating patient's urinary tract infection before his STD results returned hence reason for the Macrobid and Keflex..  I have sent him doxycycline to the pharmacy. Thanks

## 2022-03-01 NOTE — Telephone Encounter (Signed)
Patient informed. 

## 2022-03-01 NOTE — Telephone Encounter (Signed)
Trenton Gammon from [facility] called this morning about this pt.   She can see where he was given Keflex, then changed to macrobid, but the pt has Chlamydia.   He will need treatment with DOXY 100 mg BID x 7 days sent in to treat this.   Nurse- Please let pt know of the changes made   No need to reach back out to HD, she will be able to review epic.

## 2022-03-17 ENCOUNTER — Ambulatory Visit (INDEPENDENT_AMBULATORY_CARE_PROVIDER_SITE_OTHER): Payer: Medicare HMO | Admitting: Family Medicine

## 2022-03-17 ENCOUNTER — Encounter: Payer: Self-pay | Admitting: Family Medicine

## 2022-03-17 ENCOUNTER — Other Ambulatory Visit (HOSPITAL_COMMUNITY)
Admission: RE | Admit: 2022-03-17 | Discharge: 2022-03-17 | Disposition: A | Discharge status: Home / Self Care | Payer: Medicare HMO | Source: Ambulatory Visit | Attending: Family Medicine | Admitting: Family Medicine

## 2022-03-17 VITALS — BP 102/66 | HR 67 | Temp 98.8°F | Ht 66.0 in | Wt 241.0 lb

## 2022-03-17 DIAGNOSIS — A749 Chlamydial infection, unspecified: Secondary | ICD-10-CM | POA: Diagnosis not present

## 2022-03-17 DIAGNOSIS — R300 Dysuria: Secondary | ICD-10-CM | POA: Insufficient documentation

## 2022-03-17 DIAGNOSIS — Z113 Encounter for screening for infections with a predominantly sexual mode of transmission: Secondary | ICD-10-CM | POA: Diagnosis not present

## 2022-03-17 MED ORDER — CEFTRIAXONE SODIUM 1 G IJ SOLR
1.0000 g | Freq: Once | INTRAMUSCULAR | Status: AC
  Administered 2022-03-17: 1 g via INTRAMUSCULAR

## 2022-03-17 NOTE — Progress Notes (Signed)
Subjective:  Patient ID: Haynes Giannotti, male    DOB: 1946-07-07, 76 y.o.   MRN: [id number]  Patient Care Team: Dettinger, Elige Radon, MD as PCP - General (Family Medicine) Bedelia Person, MD as Consulting Physician (Neurosurgery)   Chief Complaint:  Exposure to STD (Been going on for about a month now , does not itch , states it feels pressure )   HPI: Scout Gumbs is a 76 y.o. male presenting on 03/17/2022 for Exposure to STD (Been going on for about a month now , does not itch , states it feels pressure )   Pt presents today with ongoing genital discomfort. He was seen and treated for UTI and chlamydia with keflex, Macrobid, and doxycycline. He reports he thinks he took all of these medications but is not positive. He denies recent sexual activity. He states he continues to have pressure and discomfort when voiding. No discharge or open lesions. States wound to head of penis has resolved. He was not tested for HSV, HIV, or syphilis.  Exposure to STD The patient's pertinent negatives include no genital injury, genital itching, genital lesions, pelvic pain, penile discharge, penile pain, priapism, scrotal swelling or testicular pain. Primary symptoms comment: Pressure / discomfort when voiding. This is a recurrent problem. The current episode started in the past 7 days. The pain is mild. Associated symptoms include dysuria. Pertinent negatives include no abdominal pain, anorexia, chest pain, chills, constipation, coughing, diarrhea, discolored urine, flank pain, frequency, headaches, hematuria, hesitancy, joint pain, joint swelling, nausea, painful intercourse, rash, shortness of breath, sore throat, urgency, urinary retention or vomiting.    Relevant past medical, surgical, family, and social history reviewed and updated as indicated.  Allergies and medications reviewed and updated. Data reviewed: Chart in Epic.   Past Medical History:  Diagnosis Date   Arthritis    left knee    GERD (gastroesophageal reflux disease)    Hypercholesterolemia    Obesity    Urethritis    when he was in [employer]    Past Surgical History:  Procedure Laterality Date   ANTERIOR CERVICAL DECOMP/DISCECTOMY FUSION N/A 05/06/2021   Procedure: Anterior Cervical Discectomy Fusion Cervical Three-Four, Cervical Four-Five;  Surgeon: Bedelia Person, MD;  Location: [facility] OR;  Service: Neurosurgery;  Laterality: N/A;   COLONOSCOPY     TOTAL KNEE ARTHROPLASTY Left 07/22/2020   Procedure: LEFT TOTAL KNEE ARTHROPLASTY;  Surgeon: Kathryne Hitch, MD;  Location: [facility] OR;  Service: Orthopedics;  Laterality: Left;   TRANSFORAMINAL LUMBAR INTERBODY FUSION (TLIF) WITH PEDICLE SCREW FIXATION 1 LEVEL N/A 10/20/2021   Procedure: Transforaminal Lumbar Interbody Fusion Lumbar Four-Five;  Surgeon: Bedelia Person, MD;  Location: [facility] OR;  Service: Neurosurgery;  Laterality: N/A;  Transforaminal Lumbar Interbody Fusion Lumbar Four-Five    Social History   Socioeconomic History   Marital status: Legally Separated    Spouse name: Not on file   Number of children: 2   Years of education: graduated high school   Highest education level: High school graduate  Occupational History   Occupation: retired  Tobacco Use   Smoking status: Never   Smokeless tobacco: Never  Building services engineer Use: Never used  Substance and Sexual Activity   Alcohol use: No    Alcohol/week: 0.0 standard drinks of alcohol   Drug use: No   Sexual activity: Yes    Birth control/protection: Condom  Other Topics Concern   Not on file  Social History Narrative   Lives alone -  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   Right handed   Social Determinants of Health   Financial Resource Strain: Medium Risk (12/22/2021)   Overall Financial Resource Strain (CARDIA)    Difficulty of Paying Living Expenses: Somewhat hard  Food Insecurity: Food Insecurity Present (01/01/2022)   Hunger Vital Sign    Worried  About Running Out of Food in the Last Year: Sometimes true    Ran Out of Food in the Last Year: Often true  Transportation Needs: No Transportation Needs (12/22/2021)   PRAPARE - Administrator, Civil Service (Medical): No    Lack of Transportation (Non-Medical): No  Physical Activity: Sufficiently Active (12/22/2021)   Exercise Vital Sign    Days of Exercise per Week: 7 days    Minutes of Exercise per Session: 30 min  Stress: No Stress Concern Present (12/22/2021)   [employer] of Occupational Health - Occupational Stress Questionnaire    Feeling of Stress : Not at all  Social Connections: Moderately Integrated (12/22/2021)   Social Connection and Isolation Panel [NHANES]    Frequency of Communication with Friends and Family: More than three times a week    Frequency of Social Gatherings with Friends and Family: More than three times a week    Attends Religious Services: More than 4 times per year    Active Member of [employer] or Organizations: Yes    Attends Banker Meetings: More than 4 times per year    Marital Status: Separated  Intimate Partner Violence: Not At Risk (12/22/2021)   Humiliation, Afraid, Rape, and Kick questionnaire    Fear of Current or Ex-Partner: No    Emotionally Abused: No    Physically Abused: No    Sexually Abused: No    Outpatient Encounter Medications as of 03/17/2022  Medication Sig   aspirin 325 MG EC tablet Take 325 mg by mouth every 6 (six) hours as needed for pain.   Butenafine HCl 1 % cream Apply 30 g topically 2 (two) times daily.   cetirizine (ZYRTEC) 10 MG tablet Take 10 mg by mouth daily.   cholecalciferol (VITAMIN D) 1000 UNITS tablet Take 1,000 Units by mouth daily.   docusate sodium (COLACE) 100 MG capsule Take 1 capsule (100 mg total) by mouth 2 (two) times daily.   doxycycline (VIBRA-TABS) 100 MG tablet Take 1 tablet (100 mg total) by mouth 2 (two) times daily.   methocarbamol (ROBAXIN) 500 MG tablet Take 500 mg by mouth  daily.   omeprazole (PRILOSEC) 20 MG capsule Take 20 mg by mouth daily before breakfast.   oxyCODONE-acetaminophen (PERCOCET) 5-325 MG tablet Take 1-2 tablets by mouth every 4 (four) hours as needed for severe pain.   trolamine salicylate (ASPERCREME) 10 % cream Apply 1 application topically as needed for muscle pain.   [EXPIRED] cefTRIAXone (ROCEPHIN) injection 1 g    No facility-administered encounter medications on file as of 03/17/2022.    No Known Allergies  Review of Systems  Constitutional:  Negative for activity change, appetite change, chills, diaphoresis and fatigue.  HENT:  Negative for sore throat.   Eyes:  Negative for photophobia and visual disturbance.  Respiratory:  Negative for cough and shortness of breath.   Cardiovascular:  Negative for chest pain, palpitations and leg swelling.  Gastrointestinal:  Negative for abdominal pain, anorexia, constipation, diarrhea, nausea and vomiting.  Genitourinary:  Positive for difficulty urinating and dysuria. Negative for decreased urine volume, enuresis, flank pain, frequency,  genital sores, hematuria, hesitancy, pelvic pain, penile discharge, penile pain, penile swelling, scrotal swelling, testicular pain and urgency.  Musculoskeletal:  Negative for arthralgias, joint pain and joint swelling.  Skin:  Negative for color change, rash and wound.  Neurological:  Negative for weakness and headaches.  Psychiatric/Behavioral:  Negative for confusion.   All other systems reviewed and are negative.       Objective:  BP 102/66   Pulse 67   Temp 98.8 F (37.1 C)   Ht 5\' 6"  (1.676 m)   Wt 241 lb (109.3 kg)   SpO2 95%   BMI 38.90 kg/m    Wt Readings from Last 3 Encounters:  03/17/22 241 lb (109.3 kg)  02/22/22 240 lb (108.9 kg)  12/22/21 246 lb (111.6 kg)    Physical Exam Vitals and nursing note reviewed.  Constitutional:      General: He is not in acute distress.    Appearance: Normal appearance. He is obese. He is not  ill-appearing, toxic-appearing or diaphoretic.  HENT:     Head: Normocephalic and atraumatic.     Mouth/Throat:     Mouth: Mucous membranes are moist.  Eyes:     Extraocular Movements: Extraocular movements intact.     Pupils: Pupils are equal, round, and reactive to light.  Cardiovascular:     Rate and Rhythm: Normal rate and regular rhythm.     Heart sounds: Normal heart sounds.  Pulmonary:     Effort: Pulmonary effort is normal.     Breath sounds: Normal breath sounds.  Genitourinary:    Comments: Pt declined exam today, states wound has healed Musculoskeletal:     Right lower leg: No edema.     Left lower leg: No edema.  Skin:    General: Skin is warm and dry.     Capillary Refill: Capillary refill takes less than 2 seconds.  Neurological:     General: No focal deficit present.     Mental Status: He is alert and oriented to person, place, and time.     Gait: Gait abnormal (in wheelchair).  Psychiatric:        Mood and Affect: Mood normal.        Behavior: Behavior normal.        Thought Content: Thought content normal.        Judgment: Judgment normal.     Results for orders placed or performed in visit on 02/22/22  CULTURE, URINE COMPREHENSIVE   Specimen: Urine   UR  Result Value Ref Range   Urine Culture, Comprehensive Final report (A)    Organism ID, Bacteria Enterococcus faecalis (A)    Organism ID, Bacteria Not applicable    ANTIMICROBIAL SUSCEPTIBILITY Comment   Microscopic Examination   Urine  Result Value Ref Range   WBC, UA 0-5 0 - 5 /hpf   RBC, Urine None seen 0 - 2 /hpf   Epithelial Cells (non renal) None seen 0 - 10 /hpf   Bacteria, UA Few (A) None seen/Few  Ct, Ng, Mycoplasmas NAA, Urine  Result Value Ref Range   Mycoplasma genitalium NAA Positive (A) Negative   Mycoplasma hominis NAA Negative Negative   Ureaplasma spp NAA Positive (A) Negative   Chlamydia trachomatis, NAA Positive (A) Negative   Neisseria gonorrhoeae, NAA Negative Negative   Urinalysis, Routine w reflex microscopic  Result Value Ref Range   Specific Gravity, UA 1.015 1.005 - 1.030   pH, UA 5.5 5.0 - 7.5   Color, UA Yellow Yellow  Appearance Ur Clear Clear   Leukocytes,UA 2+ (A) Negative   Protein,UA Negative Negative/Trace   Glucose, UA Negative Negative   Ketones, UA Negative Negative   RBC, UA Negative Negative   Bilirubin, UA Negative Negative   Urobilinogen, Ur 1.0 0.2 - 1.0 mg/dL   Nitrite, UA Negative Negative   Microscopic Examination See below:        Pertinent labs & imaging results that were available during my care of the patient were reviewed by me and considered in my medical decision making.  Assessment & Plan:  Kaymon was seen today for exposure to std.  Diagnoses and all orders for this visit:  Chlamydia Routine screening for STI (sexually transmitted infection) Dysuria Reports he thinks he has completed Macrobid, Keflex, and Doxycycline. Was not given Rocephin. Still has dysuria and pressure with voiding. States wound has healed. Was not tested for HIV, HSV, or syphilis. Will obtain testing today and retest for UTI and gc/c. Rocephin 1 gm IM given in office.  -     HIV Antibody (routine testing w rflx) -     RPR -     Urine cytology ancillary only -     HSV(herpes simplex vrs) 1+2 ab-IgG -     Urine Culture     Continue all other maintenance medications.  Follow up plan: Return if symptoms worsen or fail to improve.   The above assessment and management plan was discussed with the patient. The patient verbalized understanding of and has agreed to the management plan. Patient is aware to call the clinic if they develop any new symptoms or if symptoms persist or worsen. Patient is aware when to return to the clinic for a follow-up visit. Patient educated on when it is appropriate to go to the emergency department.   [name], FNP-C [facility] [phone]

## 2022-03-18 LAB — HIV ANTIBODY (ROUTINE TESTING W REFLEX): HIV Screen 4th Generation wRfx: NONREACTIVE

## 2022-03-18 LAB — HSV(HERPES SIMPLEX VRS) I + II AB-IGG
HSV 1 Glycoprotein G Ab, IgG: 33.1 index — ABNORMAL HIGH (ref 0.00–0.90)
HSV 2 IgG, Type Spec: <0.91 index (ref 0.00–0.90)

## 2022-03-18 LAB — RPR: RPR Ser Ql: NONREACTIVE

## 2022-03-19 LAB — URINE CYTOLOGY ANCILLARY ONLY
Candida Urine: NEGATIVE
Chlamydia: NEGATIVE
Comment: NEGATIVE
Comment: NEGATIVE
Comment: NORMAL
Neisseria Gonorrhea: NEGATIVE
Trichomonas: NEGATIVE

## 2022-03-19 LAB — URINE CULTURE

## 2022-07-02 ENCOUNTER — Telehealth: Payer: Self-pay | Admitting: Family Medicine

## 2022-07-02 NOTE — Telephone Encounter (Signed)
Left message for pt to return call and when he calls back to inform of the injection he is speaking of.

## 2022-07-02 NOTE — Telephone Encounter (Signed)
Patient would like to discuss getting a shot for cancer prevention. Please call back and advise.

## 2022-07-07 NOTE — Telephone Encounter (Signed)
He is probably seeing a commercial for Gardasil 9 vaccine which would not be for his age category.  That prevents cervical cancer in women.  He does have a slight white blood cell count elevation but it is only slight and we will monitor closely but I am not concerned overtly cancer at this point

## 2022-07-07 NOTE — Telephone Encounter (Signed)
Cortisone 9, vaccine. Seen on a commercial.   C/O having WBC elevation.

## 2022-07-07 NOTE — Telephone Encounter (Signed)
Pt aware of provider feedback and voiced understanding. 

## 2022-08-04 ENCOUNTER — Ambulatory Visit: Payer: Medicare HMO | Admitting: Family Medicine

## 2022-08-05 ENCOUNTER — Other Ambulatory Visit (HOSPITAL_COMMUNITY): Payer: Self-pay

## 2022-08-05 ENCOUNTER — Encounter: Payer: Self-pay | Admitting: Family

## 2022-08-05 ENCOUNTER — Telehealth: Payer: Medicare HMO | Admitting: Family

## 2022-08-05 ENCOUNTER — Telehealth: Payer: Self-pay

## 2022-08-05 ENCOUNTER — Telehealth (INDEPENDENT_AMBULATORY_CARE_PROVIDER_SITE_OTHER): Payer: Medicare HMO | Admitting: Family

## 2022-08-05 DIAGNOSIS — R609 Edema, unspecified: Secondary | ICD-10-CM | POA: Diagnosis not present

## 2022-08-05 DIAGNOSIS — R21 Rash and other nonspecific skin eruption: Secondary | ICD-10-CM | POA: Diagnosis not present

## 2022-08-05 DIAGNOSIS — Z114 Encounter for screening for human immunodeficiency virus [HIV]: Secondary | ICD-10-CM | POA: Diagnosis not present

## 2022-08-05 DIAGNOSIS — B372 Candidiasis of skin and nail: Secondary | ICD-10-CM

## 2022-08-05 DIAGNOSIS — R351 Nocturia: Secondary | ICD-10-CM | POA: Diagnosis not present

## 2022-08-05 DIAGNOSIS — L819 Disorder of pigmentation, unspecified: Secondary | ICD-10-CM | POA: Diagnosis not present

## 2022-08-05 DIAGNOSIS — I509 Heart failure, unspecified: Secondary | ICD-10-CM | POA: Diagnosis not present

## 2022-08-05 DIAGNOSIS — R030 Elevated blood-pressure reading, without diagnosis of hypertension: Secondary | ICD-10-CM | POA: Diagnosis not present

## 2022-08-05 DIAGNOSIS — Z7251 High risk heterosexual behavior: Secondary | ICD-10-CM | POA: Diagnosis not present

## 2022-08-05 DIAGNOSIS — E119 Type 2 diabetes mellitus without complications: Secondary | ICD-10-CM | POA: Diagnosis not present

## 2022-08-05 MED ORDER — NYSTATIN 100000 UNIT/GM EX POWD: 1.0000 | Freq: Three times a day (TID) | CUTANEOUS | 1 refills | Status: DC

## 2022-08-05 MED ORDER — FLUCONAZOLE 150 MG PO TABS: 150.0000 mg | ORAL_TABLET | ORAL | 0 refills | Status: DC | PRN

## 2022-08-05 MED ORDER — NYSTATIN 100000 UNIT/GM EX CREA: 1.0000 | TOPICAL_CREAM | Freq: Two times a day (BID) | CUTANEOUS | 1 refills | Status: DC

## 2022-08-05 NOTE — Telephone Encounter (Signed)
Pharmacy Patient Advocate Encounter   Received notification from [facility] that prior authorization for Nystop powder is required/requested.  Per Test Claim: Prior auth needed   PA submitted on 08/05/22 to (ins) [employer] via CoverMyMeds Key [id number] Status is pending

## 2022-08-05 NOTE — Progress Notes (Signed)
Virtual Visit  Note Due to COVID-19 pandemic this visit was conducted virtually. This visit type was conducted due to national recommendations for restrictions regarding the COVID-19 Pandemic (e.g. social distancing, sheltering in place) in an effort to limit this patient's exposure and mitigate transmission in our community. All issues noted in this document were discussed and addressed.  A physical exam was not performed with this format.  I connected with Derek [name] on 08/05/22 at 10:28 AM by telephone and verified that I am speaking with the correct person using two identifiers. Derek [name] is currently located at car and friend is currently with him during visit. The provider, Jannifer Rodney, FNP is located in their office at time of visit.  I discussed the limitations, risks, security and privacy concerns of performing an evaluation and management service by telephone and the availability of in person appointments. I also discussed with the patient that there may be a patient responsible charge related to this service. The patient expressed understanding and agreed to proceed.  Mr. mads, borgmeyer are scheduled for a virtual visit with your provider today.    Just as we do with appointments in the office, we must obtain your consent to participate.  Your consent will be active for this visit and any virtual visit you may have with one of our providers in the next 365 days.    If you have a MyChart account, I can also send a copy of this consent to you electronically.  All virtual visits are billed to your insurance company just like a traditional visit in the office.  As this is a virtual visit, video technology does not allow for your provider to perform a traditional examination.  This may limit your provider's ability to fully assess your condition.  If your provider identifies any concerns that need to be evaluated in person or the need to arrange testing such as labs, EKG, etc, we will  make arrangements to do so.    Although advances in technology are sophisticated, we cannot ensure that it will always work on either your end or our end.  If the connection with a video visit is poor, we may have to switch to a telephone visit.  With either a video or telephone visit, we are not always able to ensure that we have a secure connection.   I need to obtain your verbal consent now.   Are you willing to proceed with your visit today?   Derek [name] has provided verbal consent on 08/05/2022 for a virtual visit (video or telephone).   Jannifer Rodney, [place] 08/05/2022  10:36 AM   History and Present Illness:  PT presents today with complaints of a rash in groin for the last month after intercourse. States the rash on testicular and groin.  Rash This is a new problem. The current episode started 1 to 4 weeks ago. The problem has been gradually worsening since onset. The affected locations include the groin. The rash is characterized by itchiness and redness. He was exposed to nothing.      Review of Systems  Skin:  Positive for rash.     Observations/Objective: No SOB or distress noted   Assessment and Plan: 1. Candidal skin infection Keep clean and dry Nystatin powder and cream  Diflucan  I do not think this sexually transmitted.  Follow up in person if does not improve  - nystatin (MYCOSTATIN/NYSTOP) powder; Apply 1 Application topically 3 (three) times daily.  Dispense: 60 g; Refill:  1 - nystatin cream (MYCOSTATIN); Apply 1 Application topically 2 (two) times daily.  Dispense: 60 g; Refill: 1 - fluconazole (DIFLUCAN) 150 MG tablet; Take 1 tablet (150 mg total) by mouth every three (3) days as needed.  Dispense: 3 tablet; Refill: 0     I discussed the assessment and treatment plan with the patient. The patient was provided an opportunity to ask questions and all were answered. The patient agreed with the plan and demonstrated an understanding of the instructions.    The patient was advised to call back or seek an in-person evaluation if the symptoms worsen or if the condition fails to improve as anticipated.  The above assessment and management plan was discussed with the patient. The patient verbalized understanding of and has agreed to the management plan. Patient is aware to call the clinic if symptoms persist or worsen. Patient is aware when to return to the clinic for a follow-up visit. Patient educated on when it is appropriate to go to the emergency department.   Time call ended:  10:40 AM   I provided 12 minutes of  non face-to-face time during this encounter.    Jannifer Rodney, FNP

## 2022-08-06 ENCOUNTER — Other Ambulatory Visit (HOSPITAL_COMMUNITY): Payer: Self-pay

## 2022-08-06 NOTE — Telephone Encounter (Signed)
Patient Advocate Encounter  Prior Authorization for Nystop has been approved.    PA# PA Case ID: [id number] Effective dates: 08/16/21 through 08/16/2023  Placed a call to [facility] to notify of the approval. Spoke with the pharmacist and she stated it was picked up yesterday and paid the cash price.

## 2022-08-08 DIAGNOSIS — R351 Nocturia: Secondary | ICD-10-CM | POA: Diagnosis not present

## 2022-08-08 DIAGNOSIS — B379 Candidiasis, unspecified: Secondary | ICD-10-CM | POA: Diagnosis not present

## 2022-08-08 DIAGNOSIS — M79604 Pain in right leg: Secondary | ICD-10-CM | POA: Diagnosis not present

## 2022-08-08 DIAGNOSIS — L819 Disorder of pigmentation, unspecified: Secondary | ICD-10-CM | POA: Diagnosis not present

## 2022-08-08 DIAGNOSIS — R609 Edema, unspecified: Secondary | ICD-10-CM | POA: Diagnosis not present

## 2022-08-08 DIAGNOSIS — Z7251 High risk heterosexual behavior: Secondary | ICD-10-CM | POA: Diagnosis not present

## 2022-08-08 DIAGNOSIS — M79605 Pain in left leg: Secondary | ICD-10-CM | POA: Diagnosis not present

## 2022-08-08 DIAGNOSIS — I509 Heart failure, unspecified: Secondary | ICD-10-CM | POA: Diagnosis not present

## 2022-08-08 DIAGNOSIS — G8929 Other chronic pain: Secondary | ICD-10-CM | POA: Diagnosis not present

## 2022-08-08 DIAGNOSIS — Z0000 Encounter for general adult medical examination without abnormal findings: Secondary | ICD-10-CM | POA: Diagnosis not present

## 2022-08-08 DIAGNOSIS — M549 Dorsalgia, unspecified: Secondary | ICD-10-CM | POA: Diagnosis not present

## 2022-08-23 DIAGNOSIS — Z0130 Encounter for examination of blood pressure without abnormal findings: Secondary | ICD-10-CM | POA: Diagnosis not present

## 2022-08-23 DIAGNOSIS — Z8619 Personal history of other infectious and parasitic diseases: Secondary | ICD-10-CM | POA: Diagnosis not present

## 2022-08-23 DIAGNOSIS — R351 Nocturia: Secondary | ICD-10-CM | POA: Diagnosis not present

## 2022-08-23 DIAGNOSIS — R531 Weakness: Secondary | ICD-10-CM | POA: Diagnosis not present

## 2022-08-23 DIAGNOSIS — Z0000 Encounter for general adult medical examination without abnormal findings: Secondary | ICD-10-CM | POA: Diagnosis not present

## 2022-08-23 DIAGNOSIS — R609 Edema, unspecified: Secondary | ICD-10-CM | POA: Diagnosis not present

## 2022-09-22 DIAGNOSIS — N400 Enlarged prostate without lower urinary tract symptoms: Secondary | ICD-10-CM | POA: Diagnosis not present

## 2022-09-22 DIAGNOSIS — N529 Male erectile dysfunction, unspecified: Secondary | ICD-10-CM | POA: Diagnosis not present

## 2022-09-22 DIAGNOSIS — Z0130 Encounter for examination of blood pressure without abnormal findings: Secondary | ICD-10-CM | POA: Diagnosis not present

## 2022-09-22 DIAGNOSIS — R351 Nocturia: Secondary | ICD-10-CM | POA: Diagnosis not present

## 2022-09-22 DIAGNOSIS — Z789 Other specified health status: Secondary | ICD-10-CM | POA: Diagnosis not present

## 2022-10-18 DIAGNOSIS — R351 Nocturia: Secondary | ICD-10-CM | POA: Diagnosis not present

## 2022-10-18 DIAGNOSIS — R350 Frequency of micturition: Secondary | ICD-10-CM | POA: Diagnosis not present

## 2022-10-18 DIAGNOSIS — N401 Enlarged prostate with lower urinary tract symptoms: Secondary | ICD-10-CM | POA: Diagnosis not present

## 2022-10-18 DIAGNOSIS — R3912 Poor urinary stream: Secondary | ICD-10-CM | POA: Diagnosis not present

## 2022-10-29 DIAGNOSIS — R21 Rash and other nonspecific skin eruption: Secondary | ICD-10-CM | POA: Diagnosis not present

## 2022-12-04 ENCOUNTER — Emergency Department (HOSPITAL_COMMUNITY): Payer: Medicare HMO

## 2022-12-04 ENCOUNTER — Other Ambulatory Visit: Payer: Self-pay

## 2022-12-04 ENCOUNTER — Encounter (HOSPITAL_COMMUNITY): Payer: Self-pay

## 2022-12-04 ENCOUNTER — Inpatient Hospital Stay (HOSPITAL_COMMUNITY)
Admission: EM | Admit: 2022-12-04 | Discharge: 2022-12-08 | DRG: 481 | Disposition: A | Discharge status: Home Health | Payer: Medicare HMO | Attending: Family Medicine | Admitting: Family Medicine

## 2022-12-04 DIAGNOSIS — M9702XA Periprosthetic fracture around internal prosthetic left hip joint, initial encounter: Secondary | ICD-10-CM | POA: Diagnosis not present

## 2022-12-04 DIAGNOSIS — M9712XA Periprosthetic fracture around internal prosthetic left knee joint, initial encounter: Secondary | ICD-10-CM | POA: Diagnosis present

## 2022-12-04 DIAGNOSIS — Y92009 Unspecified place in unspecified non-institutional (private) residence as the place of occurrence of the external cause: Secondary | ICD-10-CM | POA: Diagnosis not present

## 2022-12-04 DIAGNOSIS — Z96649 Presence of unspecified artificial hip joint: Principal | ICD-10-CM

## 2022-12-04 DIAGNOSIS — M1712 Unilateral primary osteoarthritis, left knee: Secondary | ICD-10-CM | POA: Diagnosis present

## 2022-12-04 DIAGNOSIS — M1990 Unspecified osteoarthritis, unspecified site: Secondary | ICD-10-CM | POA: Diagnosis not present

## 2022-12-04 DIAGNOSIS — Z79899 Other long term (current) drug therapy: Secondary | ICD-10-CM | POA: Diagnosis not present

## 2022-12-04 DIAGNOSIS — W010XXA Fall on same level from slipping, tripping and stumbling without subsequent striking against object, initial encounter: Secondary | ICD-10-CM | POA: Diagnosis present

## 2022-12-04 DIAGNOSIS — K219 Gastro-esophageal reflux disease without esophagitis: Secondary | ICD-10-CM | POA: Diagnosis present

## 2022-12-04 DIAGNOSIS — S72402A Unspecified fracture of lower end of left femur, initial encounter for closed fracture: Secondary | ICD-10-CM | POA: Diagnosis present

## 2022-12-04 DIAGNOSIS — D62 Acute posthemorrhagic anemia: Secondary | ICD-10-CM | POA: Diagnosis not present

## 2022-12-04 DIAGNOSIS — S72492A Other fracture of lower end of left femur, initial encounter for closed fracture: Secondary | ICD-10-CM | POA: Diagnosis not present

## 2022-12-04 DIAGNOSIS — M978XXA Periprosthetic fracture around other internal prosthetic joint, initial encounter: Principal | ICD-10-CM

## 2022-12-04 DIAGNOSIS — E871 Hypo-osmolality and hyponatremia: Secondary | ICD-10-CM | POA: Diagnosis present

## 2022-12-04 DIAGNOSIS — Y9301 Activity, walking, marching and hiking: Secondary | ICD-10-CM | POA: Diagnosis present

## 2022-12-04 DIAGNOSIS — Z6838 Body mass index (BMI) 38.0-38.9, adult: Secondary | ICD-10-CM

## 2022-12-04 DIAGNOSIS — Z981 Arthrodesis status: Secondary | ICD-10-CM | POA: Diagnosis not present

## 2022-12-04 DIAGNOSIS — M25562 Pain in left knee: Secondary | ICD-10-CM | POA: Diagnosis present

## 2022-12-04 DIAGNOSIS — E7800 Pure hypercholesterolemia, unspecified: Secondary | ICD-10-CM | POA: Diagnosis present

## 2022-12-04 LAB — CBC WITH DIFFERENTIAL/PLATELET
Abs Immature Granulocytes: 0.03 10*3/uL (ref 0.00–0.07)
Basophils Absolute: 0 10*3/uL (ref 0.0–0.1)
Basophils Relative: 1 %
Eosinophils Absolute: 0.2 10*3/uL (ref 0.0–0.5)
Eosinophils Relative: 2 %
HCT: 34.5 % — ABNORMAL LOW (ref 39.0–52.0)
Hemoglobin: 11.3 g/dL — ABNORMAL LOW (ref 13.0–17.0)
Immature Granulocytes: 0 %
Lymphocytes Relative: 18 %
Lymphs Abs: 1.6 10*3/uL (ref 0.7–4.0)
MCH: 32 pg (ref 26.0–34.0)
MCHC: 32.8 g/dL (ref 30.0–36.0)
MCV: 97.7 fL (ref 80.0–100.0)
Monocytes Absolute: 0.6 10*3/uL (ref 0.1–1.0)
Monocytes Relative: 7 %
Neutro Abs: 6.5 10*3/uL (ref 1.7–7.7)
Neutrophils Relative %: 72 %
Platelets: 198 10*3/uL (ref 150–400)
RBC: 3.53 MIL/uL — ABNORMAL LOW (ref 4.22–5.81)
RDW: 13.2 % (ref 11.5–15.5)
WBC: 8.9 10*3/uL (ref 4.0–10.5)
nRBC: 0 % (ref 0.0–0.2)

## 2022-12-04 LAB — BASIC METABOLIC PANEL
Anion gap: 10 (ref 5–15)
BUN: 16 mg/dL (ref 8–23)
CO2: 23 mmol/L (ref 22–32)
Calcium: 9.4 mg/dL (ref 8.9–10.3)
Chloride: 101 mmol/L (ref 98–111)
Creatinine, Ser: 0.8 mg/dL (ref 0.61–1.24)
GFR, Estimated: >60 mL/min (ref >60)
Glucose, Bld: 106 mg/dL — ABNORMAL HIGH (ref 70–99)
Potassium: 4 mmol/L (ref 3.5–5.1)
Sodium: 134 mmol/L — ABNORMAL LOW (ref 135–145)

## 2022-12-04 LAB — APTT: aPTT: 27 seconds (ref 24–36)

## 2022-12-04 LAB — PROTIME-INR
INR: 1.2 (ref 0.8–1.2)
Prothrombin Time: 14.6 seconds (ref 11.4–15.2)

## 2022-12-04 MED ORDER — ONDANSETRON HCL 4 MG/2ML IJ SOLN: 4.0000 mg | Freq: Four times a day (QID) | INTRAMUSCULAR | Status: DC | PRN

## 2022-12-04 MED ORDER — LORATADINE 10 MG PO TABS
10.0000 mg | ORAL_TABLET | Freq: Every day | ORAL | Status: DC
  Administered 2022-12-05 – 2022-12-08 (×4): 10 mg via ORAL
  Filled 2022-12-04 (×4): qty 1

## 2022-12-04 MED ORDER — DOCUSATE SODIUM 100 MG PO CAPS
100.0000 mg | ORAL_CAPSULE | Freq: Two times a day (BID) | ORAL | Status: DC
  Administered 2022-12-05 – 2022-12-08 (×6): 100 mg via ORAL
  Filled 2022-12-04 (×7): qty 1

## 2022-12-04 MED ORDER — HYDROMORPHONE HCL 1 MG/ML IJ SOLN
0.5000 mg | INTRAMUSCULAR | Status: DC | PRN
Start: 2022-12-04 — End: 2022-12-08
  Administered 2022-12-04: 0.5 mg via INTRAVENOUS
  Filled 2022-12-04: qty 0.5

## 2022-12-04 MED ORDER — BISACODYL 5 MG PO TBEC: 5.0000 mg | DELAYED_RELEASE_TABLET | Freq: Every day | ORAL | Status: DC | PRN

## 2022-12-04 MED ORDER — VITAMIN D 25 MCG (1000 UNIT) PO TABS
1000.0000 [IU] | ORAL_TABLET | Freq: Every day | ORAL | Status: DC
  Administered 2022-12-05 – 2022-12-08 (×4): 1000 [IU] via ORAL
  Filled 2022-12-04 (×4): qty 1

## 2022-12-04 MED ORDER — OXYCODONE HCL 5 MG PO TABS
10.0000 mg | ORAL_TABLET | Freq: Four times a day (QID) | ORAL | Status: DC | PRN
  Administered 2022-12-05 (×2): 10 mg via ORAL
  Filled 2022-12-04 (×2): qty 2

## 2022-12-04 MED ORDER — PANTOPRAZOLE SODIUM 40 MG PO TBEC
40.0000 mg | DELAYED_RELEASE_TABLET | Freq: Every day | ORAL | Status: DC
Start: 2022-12-05 — End: 2022-12-08
  Administered 2022-12-05 – 2022-12-08 (×4): 40 mg via ORAL
  Filled 2022-12-04 (×4): qty 1

## 2022-12-04 MED ORDER — ONDANSETRON HCL 4 MG PO TABS: 4.0000 mg | ORAL_TABLET | Freq: Four times a day (QID) | ORAL | Status: DC | PRN
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
## 2022-12-04 NOTE — Progress Notes (Signed)
Patient ID: Derek [name], male   DOB: 24-Dec-1945, 77 y.o.   MRN: [id number] I was consulted by the Jeani Hawking, EDP about this patient.  He has a history of a left total knee replacement that was performed several years ago.  About 2 days ago he had a mechanical fall and x-rays show a periprosthetic distal femur fracture.  The knee replacement itself is intact.  He will need some type of surgical stabilization of this fracture with a likely plate and screws.  We are requesting medical admission at [facility] and a transfer to [facility] for his definitive surgery.  He can eat this weekend because I will need to consult the orthopedic trauma service on Monday for definitive treatment of this complicated fracture.
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
## 2022-12-04 NOTE — ED Triage Notes (Signed)
Pt fell over 48 hrs ago and now he says it hasn't felt better. Pt has left knee pain . IV 22 left hand. 4 mg given for pain. Pedal pulse present. Pt lives with other people but has not ambulated with walker since fall.

## 2022-12-05 DIAGNOSIS — S72492A Other fracture of lower end of left femur, initial encounter for closed fracture: Secondary | ICD-10-CM | POA: Diagnosis not present

## 2022-12-05 LAB — SURGICAL PCR SCREEN
MRSA, PCR: NEGATIVE
Staphylococcus aureus: NEGATIVE
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
## 2022-12-05 NOTE — Anesthesia Preprocedure Evaluation (Signed)
Anesthesia Evaluation  Patient identified by MRN, date of birth, ID band Patient awake    Reviewed: Allergy & Precautions, H&P , NPO status , Patient's Chart, lab work & pertinent test results  Airway Mallampati: I  TM Distance: >3 FB Neck ROM: Full    Dental no notable dental hx. (+) Teeth Intact, Dental Advisory Given, Missing, Chipped,    Pulmonary neg pulmonary ROS   Pulmonary exam normal breath sounds clear to auscultation       Cardiovascular Exercise Tolerance: Good negative cardio ROS Normal cardiovascular exam Rhythm:Regular Rate:Normal     Neuro/Psych negative neurological ROS  negative psych ROS   GI/Hepatic Neg liver ROS,GERD  Medicated and Controlled,,  Endo/Other    Morbid obesity  Renal/GU negative Renal ROS  negative genitourinary   Musculoskeletal  (+) Arthritis , Osteoarthritis,    Abdominal  (+) + obese  Peds negative pediatric ROS (+)  Hematology negative hematology ROS (+)   Anesthesia Other Findings   Reproductive/Obstetrics negative OB ROS                             Anesthesia Physical Anesthesia Plan  ASA: 3  Anesthesia Plan: General   Post-op Pain Management: Tylenol PO (pre-op)*, Celebrex PO (pre-op)* and Dilaudid IV   Induction: Intravenous  PONV Risk Score and Plan: 2 and Ondansetron, Dexamethasone and Treatment may vary due to age or medical condition  Airway Management Planned: Oral ETT and LMA  Additional Equipment: None  Intra-op Plan:   Post-operative Plan: Extubation in OR  Informed Consent: I have reviewed the patients History and Physical, chart, labs and discussed the procedure including the risks, benefits and alternatives for the proposed anesthesia with the patient or authorized representative who has indicated his/her understanding and acceptance.     Dental advisory given  Plan Discussed with: CRNA, Anesthesiologist and  Surgeon  Anesthesia Plan Comments:         Anesthesia Quick Evaluation

## 2022-12-05 NOTE — Plan of Care (Signed)
  Problem: Education: Goal: Knowledge of General Education information will improve Description Including pain rating scale, medication(s)/side effects and non-pharmacologic comfort measures Outcome: Progressing   Problem: Health Behavior/Discharge Planning: Goal: Ability to manage health-related needs will improve Outcome: Progressing   

## 2022-12-05 NOTE — Consult Note (Signed)
Orthopaedic Trauma Service (OTS) Consult   Patient ID: Derek [name] MRN: [id number] DOB/AGE: 1945/11/21 77 y.o.  Reason for Consult:Left periprosthetic distal femur fracture Referring Physician: Dr. Allie Bossier, MD Cyndia Skeeters  HPI: Derek [name] is an 77 y.o. male who is being seen in consultation at the request of Dr. Magnus Ivan for evaluation of left distal periprosthetic femur fracture.  Patient had a mechanical fall which occurred approximately 3 days ago.  He was having increased pain and inability bear weight.  He eventually presented to the emergency room where a periprosthetic distal femur fracture was found.  Patient was subsequently transferred from [facility].  Dr. Magnus Ivan has consulted me due to the complexity of his injury.  Patient was seen and evaluated on 5 N.  Currently comfortable.  Hurts when he ever moves his in the leg.  Cannot really extend his knee secondary to pain.  Denies any injuries anywhere else.  Denies any diabetes, denies any significant heart problems.  Lives alone but does have an aide that assists with him.  He has been ambulating with the use of a walker.  Past Medical History:  Diagnosis Date   Arthritis    left knee   GERD (gastroesophageal reflux disease)    Hypercholesterolemia    Obesity    Urethritis    when he was in [employer]    Past Surgical History:  Procedure Laterality Date   ANTERIOR CERVICAL DECOMP/DISCECTOMY FUSION N/A 05/06/2021   Procedure: Anterior Cervical Discectomy Fusion Cervical Three-Four, Cervical Four-Five;  Surgeon: Bedelia Person, MD;  Location: [facility] OR;  Service: Neurosurgery;  Laterality: N/A;   COLONOSCOPY     TOTAL KNEE ARTHROPLASTY Left 07/22/2020   Procedure: LEFT TOTAL KNEE ARTHROPLASTY;  Surgeon: Kathryne Hitch, MD;  Location: [facility] OR;  Service: Orthopedics;  Laterality: Left;   TRANSFORAMINAL LUMBAR INTERBODY FUSION (TLIF) WITH PEDICLE SCREW FIXATION 1 LEVEL N/A 10/20/2021   Procedure:  Transforaminal Lumbar Interbody Fusion Lumbar Four-Five;  Surgeon: Bedelia Person, MD;  Location: [facility] OR;  Service: Neurosurgery;  Laterality: N/A;  Transforaminal Lumbar Interbody Fusion Lumbar Four-Five    Family History  Problem Relation Age of Onset   Cancer Mother        breast   Diabetes Father     Social History:  reports that he has never smoked. He has never used smokeless tobacco. He reports that he does not drink alcohol and does not use drugs.  Allergies: No Known Allergies  Medications: I have reviewed the patient's current medications.  ROS: Constitutional: No fever or chills Vision: No changes in vision ENT: No difficulty swallowing CV: No chest pain Pulm: No SOB or wheezing GI: No nausea or vomiting GU: No urgency or inability to hold urine Skin: No poor wound healing Neurologic: No numbness or tingling Psychiatric: No depression or anxiety Heme: No bruising Allergic: No reaction to medications or food   Exam: Blood pressure (!) 108/54, pulse 69, temperature 98.2 F (36.8 C), temperature source Oral, resp. rate 17, height  (1.676 m), weight 108.9 kg, SpO2 94 %. General: No acute distress Orientation: Awake alert and oriented x 3 Mood and Affect: Cooperative and pleasant Gait: Unable to assess due to his fracture Coordination and balance: Within normal limits   Left lower extremity: Anterior knee incision is healed.  Moderate swelling.  The knee is held in flexion with significant pain to try to extend it.  His compartments are soft compressible.  He has active dorsiflexion plantarflexion of his foot  and ankle.  He is warm well-perfused foot.  He has brisk cap refill of less than 2 seconds.  Right lower extremity: Skin without lesions. No tenderness to palpation. Full painless ROM, full strength in each muscle groups without evidence of instability.   Medical Decision Making: Data: Imaging: X-rays of the left knee were reviewed which shows a  minimally to nondisplaced distal femur fracture.  Prosthesis appears to be in good position.  Labs:  Results for orders placed or performed during the hospital encounter of 12/04/22 (from the past 24 hour(s))  CBC with Differential     Status: Abnormal   Collection Time: 12/04/22  2:30 PM  Result Value Ref Range   WBC 8.9 4.0 - 10.5 K/uL   RBC 3.53 (L) 4.22 - 5.81 MIL/uL   Hemoglobin 11.3 (L) 13.0 - 17.0 g/dL   HCT 09.8 (L) 11.9 - 14.7 %   MCV 97.7 80.0 - 100.0 fL   MCH 32.0 26.0 - 34.0 pg   MCHC 32.8 30.0 - 36.0 g/dL   RDW 82.9 56.2 - 13.0 %   Platelets 198 150 - 400 K/uL   nRBC 0.0 0.0 - 0.2 %   Neutrophils Relative % 72 %   Neutro Abs 6.5 1.7 - 7.7 K/uL   Lymphocytes Relative 18 %   Lymphs Abs 1.6 0.7 - 4.0 K/uL   Monocytes Relative 7 %   Monocytes Absolute 0.6 0.1 - 1.0 K/uL   Eosinophils Relative 2 %   Eosinophils Absolute 0.2 0.0 - 0.5 K/uL   Basophils Relative 1 %   Basophils Absolute 0.0 0.0 - 0.1 K/uL   Immature Granulocytes 0 %   Abs Immature Granulocytes 0.03 0.00 - 0.07 K/uL  Basic metabolic panel     Status: Abnormal   Collection Time: 12/04/22  2:30 PM  Result Value Ref Range   Sodium 134 (L) 135 - 145 mmol/L   Potassium 4.0 3.5 - 5.1 mmol/L   Chloride 101 98 - 111 mmol/L   CO2 23 22 - 32 mmol/L   Glucose, Bld 106 (H) 70 - 99 mg/dL   BUN 16 8 - 23 mg/dL   Creatinine, Ser 8.65 0.61 - 1.24 mg/dL   Calcium 9.4 8.9 - 78.4 mg/dL   GFR, Estimated >69 >62 mL/min   Anion gap 10 5 - 15  Protime-INR     Status: None   Collection Time: 12/04/22  3:16 PM  Result Value Ref Range   Prothrombin Time 14.6 11.4 - 15.2 seconds   INR 1.2 0.8 - 1.2  APTT     Status: None   Collection Time: 12/04/22  3:16 PM  Result Value Ref Range   aPTT 27 24 - 36 seconds     Imaging or Labs ordered: None  Medical history and chart was reviewed and case discussed with medical provider.  Assessment/Plan: 77 year old male with periprosthetic distal femur fracture.  Due to the  unstable nature of his injury I recommend proceeding with open reduction internal fixation.  We will plan to proceed with this tomorrow morning.  Risk and benefits were discussed with the patient.  Risks include but not limited to bleeding, infection, malunion, nonunion, hardware failure, hardware irritation, nerve or blood vessel injury, DVT, even the possibility anesthetic complications.  He agreed to proceed with surgery and consent was obtained.  I have placed an order for n.p.o. after midnight and for consent.  Roby Lofts, MD [facility] [phone] (office) [email or web address]

## 2022-12-05 NOTE — H&P (View-Only) (Signed)
Orthopaedic Trauma Service (OTS) Consult   Patient ID: Derek [name] MRN: [id number] DOB/AGE: 1945/11/21 77 y.o.  Reason for Consult:Left periprosthetic distal femur fracture Referring Physician: Dr. Allie Bossier, MD Cyndia Skeeters  HPI: Derek [name] is an 77 y.o. male who is being seen in consultation at the request of Dr. Magnus Ivan for evaluation of left distal periprosthetic femur fracture.  Patient had a mechanical fall which occurred approximately 3 days ago.  He was having increased pain and inability bear weight.  He eventually presented to the emergency room where a periprosthetic distal femur fracture was found.  Patient was subsequently transferred from [facility].  Dr. Magnus Ivan has consulted me due to the complexity of his injury.  Patient was seen and evaluated on 5 N.  Currently comfortable.  Hurts when he ever moves his in the leg.  Cannot really extend his knee secondary to pain.  Denies any injuries anywhere else.  Denies any diabetes, denies any significant heart problems.  Lives alone but does have an aide that assists with him.  He has been ambulating with the use of a walker.  Past Medical History:  Diagnosis Date   Arthritis    left knee   GERD (gastroesophageal reflux disease)    Hypercholesterolemia    Obesity    Urethritis    when he was in [employer]    Past Surgical History:  Procedure Laterality Date   ANTERIOR CERVICAL DECOMP/DISCECTOMY FUSION N/A 05/06/2021   Procedure: Anterior Cervical Discectomy Fusion Cervical Three-Four, Cervical Four-Five;  Surgeon: Bedelia Person, MD;  Location: [facility] OR;  Service: Neurosurgery;  Laterality: N/A;   COLONOSCOPY     TOTAL KNEE ARTHROPLASTY Left 07/22/2020   Procedure: LEFT TOTAL KNEE ARTHROPLASTY;  Surgeon: Kathryne Hitch, MD;  Location: [facility] OR;  Service: Orthopedics;  Laterality: Left;   TRANSFORAMINAL LUMBAR INTERBODY FUSION (TLIF) WITH PEDICLE SCREW FIXATION 1 LEVEL N/A 10/20/2021   Procedure:  Transforaminal Lumbar Interbody Fusion Lumbar Four-Five;  Surgeon: Bedelia Person, MD;  Location: [facility] OR;  Service: Neurosurgery;  Laterality: N/A;  Transforaminal Lumbar Interbody Fusion Lumbar Four-Five    Family History  Problem Relation Age of Onset   Cancer Mother        breast   Diabetes Father     Social History:  reports that he has never smoked. He has never used smokeless tobacco. He reports that he does not drink alcohol and does not use drugs.  Allergies: No Known Allergies  Medications: I have reviewed the patient's current medications.  ROS: Constitutional: No fever or chills Vision: No changes in vision ENT: No difficulty swallowing CV: No chest pain Pulm: No SOB or wheezing GI: No nausea or vomiting GU: No urgency or inability to hold urine Skin: No poor wound healing Neurologic: No numbness or tingling Psychiatric: No depression or anxiety Heme: No bruising Allergic: No reaction to medications or food   Exam: Blood pressure (!) 108/54, pulse 69, temperature 98.2 F (36.8 C), temperature source Oral, resp. rate 17, height  (1.676 m), weight 108.9 kg, SpO2 94 %. General: No acute distress Orientation: Awake alert and oriented x 3 Mood and Affect: Cooperative and pleasant Gait: Unable to assess due to his fracture Coordination and balance: Within normal limits   Left lower extremity: Anterior knee incision is healed.  Moderate swelling.  The knee is held in flexion with significant pain to try to extend it.  His compartments are soft compressible.  He has active dorsiflexion plantarflexion of his foot  and ankle.  He is warm well-perfused foot.  He has brisk cap refill of less than 2 seconds.  Right lower extremity: Skin without lesions. No tenderness to palpation. Full painless ROM, full strength in each muscle groups without evidence of instability.   Medical Decision Making: Data: Imaging: X-rays of the left knee were reviewed which shows a  minimally to nondisplaced distal femur fracture.  Prosthesis appears to be in good position.  Labs:  Results for orders placed or performed during the hospital encounter of 12/04/22 (from the past 24 hour(s))  CBC with Differential     Status: Abnormal   Collection Time: 12/04/22  2:30 PM  Result Value Ref Range   WBC 8.9 4.0 - 10.5 K/uL   RBC 3.53 (L) 4.22 - 5.81 MIL/uL   Hemoglobin 11.3 (L) 13.0 - 17.0 g/dL   HCT 34.5 (L) 39.0 - 52.0 %   MCV 97.7 80.0 - 100.0 fL   MCH 32.0 26.0 - 34.0 pg   MCHC 32.8 30.0 - 36.0 g/dL   RDW 13.2 11.5 - 15.5 %   Platelets 198 150 - 400 K/uL   nRBC 0.0 0.0 - 0.2 %   Neutrophils Relative % 72 %   Neutro Abs 6.5 1.7 - 7.7 K/uL   Lymphocytes Relative 18 %   Lymphs Abs 1.6 0.7 - 4.0 K/uL   Monocytes Relative 7 %   Monocytes Absolute 0.6 0.1 - 1.0 K/uL   Eosinophils Relative 2 %   Eosinophils Absolute 0.2 0.0 - 0.5 K/uL   Basophils Relative 1 %   Basophils Absolute 0.0 0.0 - 0.1 K/uL   Immature Granulocytes 0 %   Abs Immature Granulocytes 0.03 0.00 - 0.07 K/uL  Basic metabolic panel     Status: Abnormal   Collection Time: 12/04/22  2:30 PM  Result Value Ref Range   Sodium 134 (L) 135 - 145 mmol/L   Potassium 4.0 3.5 - 5.1 mmol/L   Chloride 101 98 - 111 mmol/L   CO2 23 22 - 32 mmol/L   Glucose, Bld 106 (H) 70 - 99 mg/dL   BUN 16 8 - 23 mg/dL   Creatinine, Ser 0.80 0.61 - 1.24 mg/dL   Calcium 9.4 8.9 - 10.3 mg/dL   GFR, Estimated >60 >60 mL/min   Anion gap 10 5 - 15  Protime-INR     Status: None   Collection Time: 12/04/22  3:16 PM  Result Value Ref Range   Prothrombin Time 14.6 11.4 - 15.2 seconds   INR 1.2 0.8 - 1.2  APTT     Status: None   Collection Time: 12/04/22  3:16 PM  Result Value Ref Range   aPTT 27 24 - 36 seconds     Imaging or Labs ordered: None  Medical history and chart was reviewed and case discussed with medical provider.  Assessment/Plan: 77-year-old male with periprosthetic distal femur fracture.  Due to the  unstable nature of his injury I recommend proceeding with open reduction internal fixation.  We will plan to proceed with this tomorrow morning.  Risk and benefits were discussed with the patient.  Risks include but not limited to bleeding, infection, malunion, nonunion, hardware failure, hardware irritation, nerve or blood vessel injury, DVT, even the possibility anesthetic complications.  He agreed to proceed with surgery and consent was obtained.  I have placed an order for n.p.o. after midnight and for consent.  Dalana Pfahler P. Jermari Tamargo, MD [facility] [phone] (office) [email or web address]   

## 2022-12-06 ENCOUNTER — Encounter (HOSPITAL_COMMUNITY): Payer: Self-pay | Admitting: Family Medicine

## 2022-12-06 ENCOUNTER — Inpatient Hospital Stay (HOSPITAL_COMMUNITY): Payer: Medicare HMO

## 2022-12-06 ENCOUNTER — Encounter (HOSPITAL_COMMUNITY)
Admission: EM | Disposition: A | Discharge status: Home Health | Payer: Self-pay | Source: Home / Self Care | Attending: Internal Medicine

## 2022-12-06 ENCOUNTER — Inpatient Hospital Stay (HOSPITAL_COMMUNITY): Payer: Medicare HMO | Admitting: Anesthesiology

## 2022-12-06 ENCOUNTER — Other Ambulatory Visit: Payer: Self-pay

## 2022-12-06 DIAGNOSIS — S72492A Other fracture of lower end of left femur, initial encounter for closed fracture: Secondary | ICD-10-CM | POA: Diagnosis not present

## 2022-12-06 DIAGNOSIS — Z6838 Body mass index (BMI) 38.0-38.9, adult: Secondary | ICD-10-CM

## 2022-12-06 DIAGNOSIS — M1990 Unspecified osteoarthritis, unspecified site: Secondary | ICD-10-CM

## 2022-12-06 DIAGNOSIS — M9702XA Periprosthetic fracture around internal prosthetic left hip joint, initial encounter: Secondary | ICD-10-CM

## 2022-12-06 HISTORY — PX: ORIF FEMUR FRACTURE: SHX2119

## 2022-12-06 LAB — CBC
HCT: 32.8 % — ABNORMAL LOW (ref 39.0–52.0)
Hemoglobin: 10.8 g/dL — ABNORMAL LOW (ref 13.0–17.0)
MCH: 32.5 pg (ref 26.0–34.0)
MCHC: 32.9 g/dL (ref 30.0–36.0)
MCV: 98.8 fL (ref 80.0–100.0)
Platelets: 189 10*3/uL (ref 150–400)
RBC: 3.32 MIL/uL — ABNORMAL LOW (ref 4.22–5.81)
RDW: 12.9 % (ref 11.5–15.5)
WBC: 9.5 10*3/uL (ref 4.0–10.5)
nRBC: 0 % (ref 0.0–0.2)

## 2022-12-06 LAB — CREATININE, SERUM
Creatinine, Ser: 0.85 mg/dL (ref 0.61–1.24)
GFR, Estimated: >60 mL/min (ref >60)

## 2022-12-06 LAB — VITAMIN D 25 HYDROXY (VIT D DEFICIENCY, FRACTURES): Vit D, 25-Hydroxy: 52.37 ng/mL (ref 30–100)

## 2022-12-06 SURGERY — OPEN REDUCTION INTERNAL FIXATION (ORIF) DISTAL FEMUR FRACTURE
Anesthesia: General | Laterality: Left

## 2022-12-06 MED ORDER — CEFAZOLIN SODIUM-DEXTROSE 2-4 GM/100ML-% IV SOLN
2.0000 g | Freq: Three times a day (TID) | INTRAVENOUS | Status: AC
  Administered 2022-12-06 – 2022-12-07 (×3): 2 g via INTRAVENOUS
  Filled 2022-12-06 (×3): qty 100

## 2022-12-06 MED ORDER — METHOCARBAMOL 500 MG PO TABS: 500.0000 mg | ORAL_TABLET | Freq: Four times a day (QID) | ORAL | Status: DC | PRN

## 2022-12-06 MED ORDER — LIDOCAINE 2% (20 MG/ML) 5 ML SYRINGE
INTRAMUSCULAR | Status: DC | PRN
  Administered 2022-12-06: 100 mg via INTRAVENOUS

## 2022-12-06 MED ORDER — CEFAZOLIN SODIUM-DEXTROSE 2-3 GM-%(50ML) IV SOLR
INTRAVENOUS | Status: DC | PRN
  Administered 2022-12-06: 2 g via INTRAVENOUS

## 2022-12-06 MED ORDER — FENTANYL CITRATE (PF) 250 MCG/5ML IJ SOLN
INTRAMUSCULAR | Status: AC
  Filled 2022-12-06: qty 5

## 2022-12-06 MED ORDER — FENTANYL CITRATE (PF) 100 MCG/2ML IJ SOLN: 25.0000 ug | INTRAMUSCULAR | Status: DC | PRN

## 2022-12-06 MED ORDER — LACTATED RINGERS IV SOLN: INTRAVENOUS | Status: DC | PRN

## 2022-12-06 MED ORDER — ACETAMINOPHEN 325 MG PO TABS: 325.0000 mg | ORAL_TABLET | ORAL | Status: DC | PRN

## 2022-12-06 MED ORDER — OXYCODONE HCL 5 MG PO TABS: 5.0000 mg | ORAL_TABLET | Freq: Once | ORAL | Status: DC | PRN

## 2022-12-06 MED ORDER — PHENYLEPHRINE 80 MCG/ML (10ML) SYRINGE FOR IV PUSH (FOR BLOOD PRESSURE SUPPORT)
PREFILLED_SYRINGE | INTRAVENOUS | Status: AC
  Filled 2022-12-06: qty 10

## 2022-12-06 MED ORDER — PROPOFOL 10 MG/ML IV BOLUS
INTRAVENOUS | Status: AC
  Filled 2022-12-06: qty 20

## 2022-12-06 MED ORDER — SUGAMMADEX SODIUM 200 MG/2ML IV SOLN
INTRAVENOUS | Status: DC | PRN
  Administered 2022-12-06: 200 mg via INTRAVENOUS

## 2022-12-06 MED ORDER — VANCOMYCIN HCL 1000 MG IV SOLR
INTRAVENOUS | Status: DC | PRN
  Administered 2022-12-06: 1000 mg

## 2022-12-06 MED ORDER — ONDANSETRON HCL 4 MG/2ML IJ SOLN: 4.0000 mg | Freq: Once | INTRAMUSCULAR | Status: DC | PRN

## 2022-12-06 MED ORDER — LIDOCAINE 2% (20 MG/ML) 5 ML SYRINGE
INTRAMUSCULAR | Status: AC
  Filled 2022-12-06: qty 5

## 2022-12-06 MED ORDER — PROPOFOL 10 MG/ML IV BOLUS
INTRAVENOUS | Status: DC | PRN
  Administered 2022-12-06: 120 mg via INTRAVENOUS

## 2022-12-06 MED ORDER — MEPERIDINE HCL 25 MG/ML IJ SOLN: 6.2500 mg | INTRAMUSCULAR | Status: DC | PRN

## 2022-12-06 MED ORDER — ROCURONIUM BROMIDE 10 MG/ML (PF) SYRINGE
PREFILLED_SYRINGE | INTRAVENOUS | Status: DC | PRN
  Administered 2022-12-06: 60 mg via INTRAVENOUS

## 2022-12-06 MED ORDER — ONDANSETRON HCL 4 MG/2ML IJ SOLN
INTRAMUSCULAR | Status: DC | PRN
  Administered 2022-12-06: 4 mg via INTRAVENOUS

## 2022-12-06 MED ORDER — ROCURONIUM BROMIDE 10 MG/ML (PF) SYRINGE
PREFILLED_SYRINGE | INTRAVENOUS | Status: AC
  Filled 2022-12-06: qty 10

## 2022-12-06 MED ORDER — DEXAMETHASONE SODIUM PHOSPHATE 10 MG/ML IJ SOLN
INTRAMUSCULAR | Status: DC | PRN
  Administered 2022-12-06: 5 mg via INTRAVENOUS

## 2022-12-06 MED ORDER — CELECOXIB 200 MG PO CAPS
200.0000 mg | ORAL_CAPSULE | Freq: Two times a day (BID) | ORAL | Status: DC
  Administered 2022-12-06 – 2022-12-08 (×5): 200 mg via ORAL
  Filled 2022-12-06 (×5): qty 1

## 2022-12-06 MED ORDER — METHOCARBAMOL 1000 MG/10ML IJ SOLN: 500.0000 mg | Freq: Four times a day (QID) | INTRAVENOUS | Status: DC | PRN

## 2022-12-06 MED ORDER — ACETAMINOPHEN 500 MG PO TABS
1000.0000 mg | ORAL_TABLET | Freq: Four times a day (QID) | ORAL | Status: DC
  Administered 2022-12-06 – 2022-12-08 (×9): 1000 mg via ORAL
  Filled 2022-12-06 (×9): qty 2

## 2022-12-06 MED ORDER — CELECOXIB 200 MG PO CAPS: 200.0000 mg | ORAL_CAPSULE | Freq: Once | ORAL | Status: DC

## 2022-12-06 MED ORDER — CEFAZOLIN SODIUM 1 G IJ SOLR
INTRAMUSCULAR | Status: AC
  Filled 2022-12-06: qty 20

## 2022-12-06 MED ORDER — ENOXAPARIN SODIUM 40 MG/0.4ML IJ SOSY
40.0000 mg | PREFILLED_SYRINGE | INTRAMUSCULAR | Status: DC
  Administered 2022-12-07 – 2022-12-08 (×2): 40 mg via SUBCUTANEOUS
  Filled 2022-12-06 (×2): qty 0.4

## 2022-12-06 MED ORDER — OXYCODONE HCL 5 MG PO TABS
5.0000 mg | ORAL_TABLET | Freq: Four times a day (QID) | ORAL | Status: DC | PRN
  Administered 2022-12-07: 10 mg via ORAL
  Filled 2022-12-06: qty 2

## 2022-12-06 MED ORDER — OXYCODONE HCL 5 MG/5ML PO SOLN: 5.0000 mg | Freq: Once | ORAL | Status: DC | PRN

## 2022-12-06 MED ORDER — ACETAMINOPHEN 160 MG/5ML PO SOLN: 325.0000 mg | ORAL | Status: DC | PRN

## 2022-12-06 MED ORDER — 0.9 % SODIUM CHLORIDE (POUR BTL) OPTIME
TOPICAL | Status: DC | PRN
  Administered 2022-12-06: 1000 mL

## 2022-12-06 MED ORDER — POLYETHYLENE GLYCOL 3350 17 G PO PACK: 17.0000 g | PACK | Freq: Every day | ORAL | Status: DC | PRN

## 2022-12-06 MED ORDER — METOCLOPRAMIDE HCL 5 MG PO TABS: 5.0000 mg | ORAL_TABLET | Freq: Three times a day (TID) | ORAL | Status: DC | PRN

## 2022-12-06 MED ORDER — VANCOMYCIN HCL 1000 MG IV SOLR
INTRAVENOUS | Status: AC
  Filled 2022-12-06: qty 20

## 2022-12-06 MED ORDER — ACETAMINOPHEN 500 MG PO TABS: 1000.0000 mg | ORAL_TABLET | Freq: Once | ORAL | Status: DC

## 2022-12-06 MED ORDER — SODIUM CHLORIDE (PF) 0.9 % IJ SOLN
INTRAMUSCULAR | Status: AC
  Filled 2022-12-06: qty 10

## 2022-12-06 MED ORDER — METOCLOPRAMIDE HCL 5 MG/ML IJ SOLN: 5.0000 mg | Freq: Three times a day (TID) | INTRAMUSCULAR | Status: DC | PRN

## 2022-12-06 MED ORDER — PHENYLEPHRINE 80 MCG/ML (10ML) SYRINGE FOR IV PUSH (FOR BLOOD PRESSURE SUPPORT)
PREFILLED_SYRINGE | INTRAVENOUS | Status: DC | PRN
  Administered 2022-12-06: 80 ug via INTRAVENOUS

## 2022-12-06 MED ORDER — SODIUM CHLORIDE 0.9 % IV SOLN: INTRAVENOUS | Status: DC

## 2022-12-06 MED ORDER — FENTANYL CITRATE (PF) 250 MCG/5ML IJ SOLN
INTRAMUSCULAR | Status: DC | PRN
  Administered 2022-12-06 (×5): 50 ug via INTRAVENOUS

## 2022-12-06 SURGICAL SUPPLY — 69 items
BAG COUNTER SPONGE SURGICOUNT (BAG) ×1 IMPLANT
BIT DRILL 4.3 (BIT) ×1
BIT DRILL 4.3X300MM (BIT) IMPLANT
BIT DRILL LONG 3.3 (BIT) IMPLANT
BIT DRILL QC 3.3X195 (BIT) IMPLANT
BLADE CLIPPER SURG (BLADE) IMPLANT
BNDG COHESIVE 6X5 TAN ST LF (GAUZE/BANDAGES/DRESSINGS) ×1 IMPLANT
BNDG ELASTIC 4INX 5YD STR LF (GAUZE/BANDAGES/DRESSINGS) IMPLANT
BNDG ELASTIC 6INX 5YD STR LF (GAUZE/BANDAGES/DRESSINGS) IMPLANT
BNDG ELASTIC 6X10 VLCR STRL LF (GAUZE/BANDAGES/DRESSINGS) ×1 IMPLANT
BRUSH SCRUB EZ PLAIN DRY (MISCELLANEOUS) ×2 IMPLANT
CANISTER SUCT 3000ML PPV (MISCELLANEOUS) ×1 IMPLANT
CAP LOCK NCB (Cap) IMPLANT
CHLORAPREP W/TINT 26 (MISCELLANEOUS) ×1 IMPLANT
COVER SURGICAL LIGHT HANDLE (MISCELLANEOUS) ×1 IMPLANT
DERMABOND ADVANCED .7 DNX12 (GAUZE/BANDAGES/DRESSINGS) IMPLANT
DRAPE C-ARM 42X72 X-RAY (DRAPES) ×1 IMPLANT
DRAPE C-ARMOR (DRAPES) ×1 IMPLANT
DRAPE HALF SHEET 40X57 (DRAPES) ×2 IMPLANT
DRAPE ORTHO SPLIT 77X108 STRL (DRAPES) ×2
DRAPE SURG 17X23 STRL (DRAPES) ×1 IMPLANT
DRAPE SURG ORHT 6 SPLT 77X108 (DRAPES) ×2 IMPLANT
DRAPE U-SHAPE 47X51 STRL (DRAPES) ×1 IMPLANT
DRESSING MEPILEX FLEX 4X4 (GAUZE/BANDAGES/DRESSINGS) IMPLANT
DRSG ADAPTIC 3X8 NADH LF (GAUZE/BANDAGES/DRESSINGS) IMPLANT
DRSG MEPILEX FLEX 4X4 (GAUZE/BANDAGES/DRESSINGS)
DRSG MEPILEX POST OP 4X12 (GAUZE/BANDAGES/DRESSINGS) IMPLANT
DRSG MEPILEX POST OP 4X8 (GAUZE/BANDAGES/DRESSINGS) IMPLANT
ELECT REM PT RETURN 9FT ADLT (ELECTROSURGICAL) ×1
ELECTRODE REM PT RTRN 9FT ADLT (ELECTROSURGICAL) ×1 IMPLANT
GAUZE PAD ABD 8X10 STRL (GAUZE/BANDAGES/DRESSINGS) ×3 IMPLANT
GAUZE SPONGE 4X4 12PLY STRL (GAUZE/BANDAGES/DRESSINGS) ×1 IMPLANT
GLOVE BIO SURGEON STRL SZ 6.5 (GLOVE) ×3 IMPLANT
GLOVE BIO SURGEON STRL SZ7.5 (GLOVE) ×4 IMPLANT
GLOVE BIOGEL PI IND STRL 6.5 (GLOVE) ×1 IMPLANT
GLOVE BIOGEL PI IND STRL 7.5 (GLOVE) ×1 IMPLANT
GOWN STRL REUS W/ TWL LRG LVL3 (GOWN DISPOSABLE) ×3 IMPLANT
GOWN STRL REUS W/TWL LRG LVL3 (GOWN DISPOSABLE) ×3
K-WIRE 2.0 (WIRE) ×1
K-WIRE FXSTD 280X2XNS SS (WIRE) ×1
KIT BASIN OR (CUSTOM PROCEDURE TRAY) ×1 IMPLANT
KIT TURNOVER KIT B (KITS) ×1 IMPLANT
KWIRE FXSTD 280X2XNS SS (WIRE) IMPLANT
NS IRRIG 1000ML POUR BTL (IV SOLUTION) ×1 IMPLANT
PACK TOTAL JOINT (CUSTOM PROCEDURE TRAY) ×1 IMPLANT
PAD ARMBOARD 7.5X6 YLW CONV (MISCELLANEOUS) ×2 IMPLANT
PAD CAST 4YDX4 CTTN HI CHSV (CAST SUPPLIES) ×1 IMPLANT
PADDING CAST COTTON 4X4 STRL (CAST SUPPLIES) ×1
PADDING CAST COTTON 6X4 STRL (CAST SUPPLIES) ×1 IMPLANT
PLATE DIST FEM 12H (Plate) IMPLANT
SCREW CORTICAL NCB 5.0X90MM (Screw) IMPLANT
SCREW NCB 4.0MX38M (Screw) IMPLANT
SCREW NCB 5.0X38 (Screw) IMPLANT
SCREW NCB 5.0X85MM (Screw) IMPLANT
SPONGE T-LAP 18X18 ~~LOC~~+RFID (SPONGE) IMPLANT
STAPLER VISISTAT 35W (STAPLE) ×1 IMPLANT
SUCTION FRAZIER HANDLE 10FR (MISCELLANEOUS) ×1
SUCTION TUBE FRAZIER 10FR DISP (MISCELLANEOUS) ×1 IMPLANT
SUT ETHILON 3 0 PS 1 (SUTURE) ×2 IMPLANT
SUT MNCRL AB 3-0 PS2 27 (SUTURE) IMPLANT
SUT VIC AB 0 CT1 27 (SUTURE) ×1
SUT VIC AB 0 CT1 27XBRD ANBCTR (SUTURE) IMPLANT
SUT VIC AB 1 CT1 27 (SUTURE)
SUT VIC AB 1 CT1 27XBRD ANBCTR (SUTURE) IMPLANT
SUT VIC AB 2-0 CT1 27 (SUTURE) ×1
SUT VIC AB 2-0 CT1 TAPERPNT 27 (SUTURE) ×2 IMPLANT
TOWEL GREEN STERILE (TOWEL DISPOSABLE) ×2 IMPLANT
TRAY FOLEY MTR SLVR 16FR STAT (SET/KITS/TRAYS/PACK) IMPLANT
WATER STERILE IRR 1000ML POUR (IV SOLUTION) ×2 IMPLANT

## 2022-12-06 NOTE — Plan of Care (Signed)
  Problem: Health Behavior/Discharge Planning: Goal: Ability to manage health-related needs will improve Outcome: Progressing   Problem: Activity: Goal: Risk for activity intolerance will decrease Outcome: Progressing   Problem: Nutrition: Goal: Adequate nutrition will be maintained Outcome: Not Progressing   

## 2022-12-06 NOTE — Evaluation (Signed)
Physical Therapy Evaluation Patient Details Name: Derek [name] MRN: [id number] DOB: 02/17/1946 Today's Date: 12/06/2022  History of Present Illness  77 yo male with onset of mechanical fall, two days before admission on 4/20.  Pt is now WBAT after sx 4/21 for screws and plate for LLE distal periprosthetic fracture.  Had suspected AVN on L hip as well.   PMHx:  GERD, obesity, L TKA, TLIF L4-5, ACDF, OA,  Clinical Impression  Pt was seen for mobility on side of bed to stand with attempts on RW.  He is having a lot of weakness and instability on LLE, relying on walker and requiring mod to max assist to move.  Pt is hoping to go directly home from hosp, but PT is recommending SNF care for recovery of standing control and strength on LLE.  Pt has a lot of surgery in his history, and so requires a lot of support and strengthening to return to a more reliable control of gait and standing balance skill.  Home with friends, may be insufficient and pt plans to stay in bed per his report.       Recommendations for follow up therapy are one component of a multi-disciplinary discharge planning process, led by the attending physician.  Recommendations may be updated based on patient status, additional functional criteria and insurance authorization.  Follow Up Recommendations Can patient physically be transported by private vehicle: No     Assistance Recommended at Discharge Frequent or constant Supervision/Assistance  Patient can return home with the following  Two people to help with walking and/or transfers;Two people to help with bathing/dressing/bathroom;Assistance with cooking/housework;Direct supervision/assist for medications management;Direct supervision/assist for financial management;Assist for transportation;Help with stairs or ramp for entrance    Equipment Recommendations None recommended by PT  Recommendations for Other Services       Functional Status Assessment Patient has had a recent  decline in their functional status and demonstrates the ability to make significant improvements in function in a reasonable and predictable amount of time.     Precautions / Restrictions Precautions Precautions: Fall Precaution Comments: monitor balance 2/2 spinal sx history Restrictions Weight Bearing Restrictions: Yes LLE Weight Bearing: Weight bearing as tolerated      Mobility  Bed Mobility Overal bed mobility: Needs Assistance Bed Mobility: Supine to Sit, Sit to Supine     Supine to sit: Mod assist Sit to supine: Mod assist   General bed mobility comments: Mod assist out for trunk and LLE, mod to return to bed with help to lift legs and progress up the bed    Transfers Overall transfer level: Needs assistance Equipment used: Rolling walker (2 wheels), 1 person hand held assist Transfers: Sit to/from Stand Sit to Stand: Total assist           General transfer comment: Partially standing with one person at walker    Ambulation/Gait               General Gait Details: unable to step  Stairs            Wheelchair Mobility    Modified Rankin (Stroke Patients Only)       Balance Overall balance assessment: Needs assistance, History of Falls Sitting-balance support: Feet supported, Bilateral upper extremity supported Sitting balance-Leahy Scale: Fair     Standing balance support: Bilateral upper extremity supported, During functional activity Standing balance-Leahy Scale: Zero  Pertinent Vitals/Pain Pain Assessment Pain Assessment: Faces Faces Pain Scale: Hurts little more Pain Location: L hip wiht mobility Pain Descriptors / Indicators: Guarding, Sore Pain Intervention(s): Limited activity within patient's tolerance, Monitored during session, Premedicated before session, Repositioned    Home Living Family/patient expects to be discharged to:: Private residence Living Arrangements:  Non-relatives/Friends Available Help at Discharge: Friend(s);Available PRN/intermittently Type of Home: House Home Access: Ramped entrance       Home Layout: One level Home Equipment: Rollator (4 wheels);Shower seat;BSC/3in1;Grab bars - tub/shower;Hand held shower head;Wheelchair - manual Additional Comments: Pt feels his friends can help at home, does not endorse need for SNF    Prior Function Prior Level of Function : Needs assist       Physical Assist : Mobility (physical) Mobility (physical): Gait   Mobility Comments: rollator walker previously ADLs Comments: has mult tools to assist him     Hand Dominance   Dominant Hand: Right    Extremity/Trunk Assessment   Upper Extremity Assessment Upper Extremity Assessment: Overall WFL for tasks assessed    Lower Extremity Assessment Lower Extremity Assessment: LLE deficits/detail;RLE deficits/detail RLE Deficits / Details: weakness at hip level RLE Coordination: decreased gross motor LLE: Unable to fully assess due to immobilization (generalized weakness post op) LLE Coordination: decreased gross motor    Cervical / Trunk Assessment Cervical / Trunk Assessment: Other exceptions (hx cervical and lumbar surgery)  Communication   Communication: No difficulties  Cognition Arousal/Alertness: Awake/alert Behavior During Therapy: Impulsive, WFL for tasks assessed/performed Overall Cognitive Status: No family/caregiver present to determine baseline cognitive functioning                                 General Comments: pt is not realistic about the amount of help he needs to move        General Comments General comments (skin integrity, edema, etc.): Pt is partially able to stand on side of bed but is unsafe, tends to keep scooting to EOB but can help to scoot backward with cues    Exercises     Assessment/Plan    PT Assessment Patient needs continued PT services  PT Problem List Decreased  strength;Decreased range of motion;Decreased activity tolerance;Decreased balance;Decreased mobility;Decreased coordination;Decreased knowledge of use of DME;Decreased safety awareness;Decreased skin integrity;Pain       PT Treatment Interventions DME instruction;Gait training;Functional mobility training;Therapeutic activities;Therapeutic exercise;Balance training;Neuromuscular re-education;Patient/family education    PT Goals (Current goals can be found in the Care Plan section)  Acute Rehab PT Goals Patient Stated Goal: To go directly home PT Goal Formulation: With patient Time For Goal Achievement: 12/20/22 Potential to Achieve Goals: Fair    Frequency Min 4X/week     Co-evaluation               AM-PAC PT "6 Clicks" Mobility  Outcome Measure Help needed turning from your back to your side while in a flat bed without using bedrails?: A Lot Help needed moving from lying on your back to sitting on the side of a flat bed without using bedrails?: A Lot Help needed moving to and from a bed to a chair (including a wheelchair)?: Total Help needed standing up from a chair using your arms (e.g., wheelchair or bedside chair)?: Total Help needed to walk in hospital room?: Total Help needed climbing 3-5 steps with a railing? : Total 6 Click Score: 8    End of Session Equipment Utilized During Treatment:  Gait belt Activity Tolerance: Patient limited by fatigue;Treatment limited secondary to medical complications (Comment) Patient left: in bed;with call bell/phone within reach;with bed alarm set Nurse Communication: Mobility status;Other (comment) (O2 sats supported after removing cannula) PT Visit Diagnosis: Unsteadiness on feet (R26.81);Muscle weakness (generalized) (M62.81);Difficulty in walking, not elsewhere classified (R26.2);Other abnormalities of gait and mobility (R26.89);Repeated falls (R29.6);History of falling (Z91.81);Pain Pain - Right/Left: Left Pain - part of body: Hip     Time: 1610-9604 PT Time Calculation (min) (ACUTE ONLY): 29 min   Charges:   PT Evaluation $PT Eval Moderate Complexity: 1 Mod PT Treatments $Therapeutic Activity: 8-22 mins       [name] 12/06/2022, 12:45 PM  Samul Dada, PT PhD Acute Rehab Dept. Number: [facility] [id number] and [facility] [phone]

## 2022-12-06 NOTE — Transfer of Care (Signed)
Immediate Anesthesia Transfer of Care Note  Patient: Derek [name]  Procedure(s) Performed: OPEN REDUCTION INTERNAL FIXATION (ORIF) DISTAL FEMUR FRACTURE (Left)  Patient Location: PACU  Anesthesia Type:General  Level of Consciousness: awake, alert , oriented, and patient cooperative  Airway & Oxygen Therapy: Patient Spontanous Breathing and Patient connected to nasal cannula oxygen  Post-op Assessment: Report given to RN and Post -op Vital signs reviewed and stable  Post vital signs: Reviewed and stable  Last Vitals:  Vitals Value Taken Time  BP 140/68 12/06/22 0845  Temp    Pulse 78 12/06/22 0847  Resp 15 12/06/22 0847  SpO2 94 % 12/06/22 0847  Vitals shown include unvalidated device data.  Last Pain:  Vitals:   12/06/22 0526  TempSrc: Oral  PainSc:          Complications: No notable events documented.

## 2022-12-06 NOTE — Interval H&P Note (Signed)
History and Physical Interval Note:  12/06/2022 7:16 AM  Derek [name]  has presented today for surgery, with the diagnosis of Left distal femur.  The various methods of treatment have been discussed with the patient and family. After consideration of risks, benefits and other options for treatment, the patient has consented to  Procedure(s): OPEN REDUCTION INTERNAL FIXATION (ORIF) DISTAL FEMUR FRACTURE (Left) as a surgical intervention.  The patient's history has been reviewed, patient examined, no change in status, stable for surgery.  I have reviewed the patient's chart and labs.  Questions were answered to the patient's satisfaction.     Caryn Bee P Siya Flurry

## 2022-12-06 NOTE — Anesthesia Procedure Notes (Signed)
Procedure Name: Intubation Date/Time: 12/06/2022 7:38 AM  Performed by: Waynard Edwards, CRNAPre-anesthesia Checklist: Patient identified, Emergency Drugs available, Suction available and Patient being monitored Patient Re-evaluated:Patient Re-evaluated prior to induction Oxygen Delivery Method: Circle system utilized Preoxygenation: Pre-oxygenation with 100% oxygen Induction Type: IV induction Ventilation: Mask ventilation without difficulty and Oral airway inserted - appropriate to patient size Laryngoscope Size: Miller and 3 Grade View: Grade I Tube type: Oral Tube size: 7.5 mm Number of attempts: 1 Airway Equipment and Method: Stylet and Oral airway Placement Confirmation: ETT inserted through vocal cords under direct vision, positive ETCO2 and breath sounds checked- equal and bilateral Secured at: 21 cm Tube secured with: Tape Dental Injury: Teeth and Oropharynx as per pre-operative assessment

## 2022-12-06 NOTE — Op Note (Signed)
Orthopaedic Surgery Operative Note (CSN: [id number] ) Date of Surgery: 12/06/2022  Admit Date: 12/04/2022   Diagnoses: Pre-Op Diagnoses: Left periprosthetic distal femur fracture   Post-Op Diagnosis: Same  Procedures: CPT 27511-Open reduction internal fixation of left distal femur fracture  Surgeons : Primary: Roby Lofts, MD  Assistant: Ulyses Southward, PA-C  Location: OR 3   Anesthesia: General   Antibiotics: Ancef 2g preop with 1 gm vancomycin powder placed topically   Tourniquet time: None    Estimated Blood Loss: 50 mL  Complications:None   Specimens: None   Implants: Implant Name Type Inv. Item Serial No. Manufacturer Lot No. LRB No. Used Action  SCREW NCB 5.0X85MM - [id number] Screw SCREW NCB 5.0X85MM  [employer](ORTH,TRAU,BIO,SG)  Left 4 Implanted  SCREW NCB 5.0X38 - [id number] Screw SCREW NCB 5.0X38  [employer](ORTH,TRAU,BIO,SG)  Left 2 Implanted  CAP LOCK NCB - [id number] Cap CAP LOCK NCB  [employer](ORTH,TRAU,BIO,SG)  Left 5 Implanted  PLATE DIST FEM 52W - [id number] Plate PLATE DIST FEM 72Z  [employer](ORTH,TRAU,BIO,SG)  Left 1 Implanted  SCREW CORTICAL NCB 5.0X90MM - [id number] Screw SCREW CORTICAL NCB 5.0X90MM  [employer](ORTH,TRAU,BIO,SG) [id number] Left 1 Implanted  SCREW NCB 4.0MX38M - [id number] Screw SCREW NCB 4.0MX38M  [employer](ORTH,TRAU,BIO,SG)  Left 1 Implanted     Indications for Surgery: 77 year old male who sustained a fall and a left periprosthetic distal femur fracture.  Due to the unstable nature of his injury I recommend proceeding with open reduction internal fixation.  Risk and benefits were discussed with the patient.  Risks included but not limited to bleeding, infection, malunion, nonunion, hardware failure, hardware irritation, nerve or blood vessel injury, DVT, even the possibility of anesthetic complications.  He agreed to proceed with surgery and consent was obtained.  Operative Findings: Open reduction internal fixation of  left periprosthetic distal femur fracture using [employer] NCB distal femoral locking plate  Procedure: The patient was identified in the preoperative holding area. Consent was confirmed with the patient and their family and all questions were answered. The operative extremity was marked after confirmation with the patient. he was then brought back to the operating room by our anesthesia colleagues.  He was placed under general anesthetic and carefully transferred over to radiolucent flattop table.  A bump was placed under his operative hip.  The left lower extremity was then prepped and draped in usual sterile fashion.  A timeout was performed to verify the patient, the procedure, and the extremity.  Preoperative antibiotics were dosed.  Fluoroscopic imaging was obtained to show the unstable nature of his injury.  A lateral approach to the distal femur was carried down through skin and subcutaneous tissue.  I incised through the IT band and mobilized the vastus lateralis to expose the lateral condyle of the femur.  I then placed a 12 hole [employer] NCB plate attached to the targeting arm and slid submuscularly along the lateral cortex of the femur.  I confirmed positioning with fluoroscopy and then placed a 2.0 mm guidewire distally to hold the distal portion of the plate.  I then percutaneously placed a 3.3 mm drill bit in the proximal portion of the plate to hold this position as well.  I then drilled and placed 9.0 millimeter screws distally to bring the plate flush to bone.  I then placed 5.0 millimeter screws into the femoral shaft.  I then proceeded to remove the 3.3 mm drill bit and placed a 4.0 millimeter screw at the most proximal hole of  the plate.  I then placed locking caps on the 5.0 millimeter screws in the femoral shaft and removed the targeting arm.  I then drilled and placed 3 more 5.0 millimeter screws distally.  I then placed locking caps on all of these.  Final fluoroscopic imaging  was obtained.  The incisions were copiously irrigated.  A layered closure of 0 Vicryl, 2-0 Vicryl and 3-0 Monocryl with Dermabond was used to close the skin.  Sterile dressings were applied.  Patient was awoken from anesthesia and taken to the PACU in stable condition  Post Op Plan/Instructions: Patient be weightbearing as tolerated to the left lower extremity.  He will receive postoperative Ancef.  He will receive Lovenox for DVT prophylaxis and discharged on aspirin 325 mg.  Will have him mobilize with physical and Occupational Therapy.  I was present and performed the entire surgery.  Ulyses Southward, PA-C did assist me throughout the case. An assistant was necessary given the difficulty in approach, maintenance of reduction and ability to instrument the fracture.   Truitt Merle, MD [facility]
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
## 2022-12-06 NOTE — Anesthesia Postprocedure Evaluation (Signed)
Anesthesia Post Note  Patient: Easter Schinke  Procedure(s) Performed: OPEN REDUCTION INTERNAL FIXATION (ORIF) DISTAL FEMUR FRACTURE (Left)     Patient location during evaluation: PACU Anesthesia Type: General Level of consciousness: awake and alert Pain management: pain level controlled Vital Signs Assessment: post-procedure vital signs reviewed and stable Respiratory status: spontaneous breathing, nonlabored ventilation, respiratory function stable and patient connected to nasal cannula oxygen Cardiovascular status: blood pressure returned to baseline and stable Postop Assessment: no apparent nausea or vomiting Anesthetic complications: no   No notable events documented.  Last Vitals:  Vitals:   12/06/22 0942 12/06/22 1249  BP: 131/76 112/72  Pulse: 83 82  Resp: 19 18  Temp: 36.7 C 36.6 C  SpO2: 96% 96%    Last Pain:  Vitals:   12/06/22 1249  TempSrc: Oral  PainSc:                  Gurtha Picker

## 2022-12-07 ENCOUNTER — Encounter (HOSPITAL_COMMUNITY): Payer: Self-pay | Admitting: Student

## 2022-12-07 DIAGNOSIS — S72492A Other fracture of lower end of left femur, initial encounter for closed fracture: Secondary | ICD-10-CM | POA: Diagnosis not present

## 2022-12-07 LAB — CBC
HCT: 30 % — ABNORMAL LOW (ref 39.0–52.0)
Hemoglobin: 9.9 g/dL — ABNORMAL LOW (ref 13.0–17.0)
MCH: 32.1 pg (ref 26.0–34.0)
MCHC: 33 g/dL (ref 30.0–36.0)
MCV: 97.4 fL (ref 80.0–100.0)
Platelets: 190 10*3/uL (ref 150–400)
RBC: 3.08 MIL/uL — ABNORMAL LOW (ref 4.22–5.81)
RDW: 12.7 % (ref 11.5–15.5)
WBC: 8.3 10*3/uL (ref 4.0–10.5)
nRBC: 0 % (ref 0.0–0.2)

## 2022-12-07 LAB — BASIC METABOLIC PANEL
Anion gap: 9 (ref 5–15)
BUN: 21 mg/dL (ref 8–23)
CO2: 23 mmol/L (ref 22–32)
Calcium: 9.1 mg/dL (ref 8.9–10.3)
Chloride: 100 mmol/L (ref 98–111)
Creatinine, Ser: 1.02 mg/dL (ref 0.61–1.24)
GFR, Estimated: >60 mL/min (ref >60)
Glucose, Bld: 126 mg/dL — ABNORMAL HIGH (ref 70–99)
Potassium: 3.8 mmol/L (ref 3.5–5.1)
Sodium: 132 mmol/L — ABNORMAL LOW (ref 135–145)

## 2022-12-07 NOTE — Progress Notes (Signed)
Orthopaedic Trauma Progress Note  SUBJECTIVE: Doing well this AM. Notes minimal pain in the left leg at rest. Denies any numbness/tingling.  No chest pain. No SOB. No nausea/vomiting. No other complaints.   OBJECTIVE:  Vitals:   12/07/22 0756 12/07/22 0808  BP: 108/71 123/65  Pulse: 62 70  Resp: 17 17  Temp: 98.2 F (36.8 C) 98 F (36.7 C)  SpO2: 97% 99%    General: Resting in bed, NAD Respiratory: No increased work of breathing.  LLE: Dressing CDI. No significant tenderness with palpation about the knee. Tolerates gentle knee and ankle ROM. Able to wiggle toes. Compartments soft and compressible. No calf tenderness. +DP pulse  IMAGING: Stable post op imaging.   LABS:  Results for orders placed or performed during the hospital encounter of 12/04/22 (from the past 24 hour(s))  CBC     Status: Abnormal   Collection Time: 12/06/22 11:57 AM  Result Value Ref Range   WBC 9.5 4.0 - 10.5 K/uL   RBC 3.32 (L) 4.22 - 5.81 MIL/uL   Hemoglobin 10.8 (L) 13.0 - 17.0 g/dL   HCT 16.1 (L) 09.6 - 04.5 %   MCV 98.8 80.0 - 100.0 fL   MCH 32.5 26.0 - 34.0 pg   MCHC 32.9 30.0 - 36.0 g/dL   RDW 40.9 81.1 - 91.4 %   Platelets 189 150 - 400 K/uL   nRBC 0.0 0.0 - 0.2 %  Creatinine, serum     Status: None   Collection Time: 12/06/22 11:57 AM  Result Value Ref Range   Creatinine, Ser 0.85 0.61 - 1.24 mg/dL   GFR, Estimated >78 >29 mL/min  VITAMIN D 25 Hydroxy (Vit-D Deficiency, Fractures)     Status: None   Collection Time: 12/06/22 11:57 AM  Result Value Ref Range   Vit D, 25-Hydroxy 52.37 30 - 100 ng/mL  CBC     Status: Abnormal   Collection Time: 12/07/22  1:39 AM  Result Value Ref Range   WBC 8.3 4.0 - 10.5 K/uL   RBC 3.08 (L) 4.22 - 5.81 MIL/uL   Hemoglobin 9.9 (L) 13.0 - 17.0 g/dL   HCT 56.2 (L) 13.0 - 86.5 %   MCV 97.4 80.0 - 100.0 fL   MCH 32.1 26.0 - 34.0 pg   MCHC 33.0 30.0 - 36.0 g/dL   RDW 78.4 69.6 - 29.5 %   Platelets 190 150 - 400 K/uL   nRBC 0.0 0.0 - 0.2 %  Basic  metabolic panel     Status: Abnormal   Collection Time: 12/07/22  1:39 AM  Result Value Ref Range   Sodium 132 (L) 135 - 145 mmol/L   Potassium 3.8 3.5 - 5.1 mmol/L   Chloride 100 98 - 111 mmol/L   CO2 23 22 - 32 mmol/L   Glucose, Bld 126 (H) 70 - 99 mg/dL   BUN 21 8 - 23 mg/dL   Creatinine, Ser 2.84 0.61 - 1.24 mg/dL   Calcium 9.1 8.9 - 13.2 mg/dL   GFR, Estimated >44 >01 mL/min   Anion gap 9 5 - 15    ASSESSMENT: Derek [name] is a 77 y.o. male, 1 Day Post-Op s/p OPEN REDUCTION INTERNAL FIXATION LEFT DISTAL FEMUR FRACTURE  CV/Blood loss: Acute blood loss anemia, Hgb 9.9 this AM. Hemodynamically stable  PLAN: Weightbearing: WBAT LLE ROM: unrestricted ROM  Incisional and dressing care: Reinforce dressings as needed  Showering:  Ok to begin getting incisions wet 12/09/22 Orthopedic device(s): None  Pain management:  1.  Tylenol 1000 mg q 6 hours scheduled 2. Robaxin 500 mg q 6 hours PRN 3. Oxycodone 5-10 mg q 4 hours PRN 4. Dilaudid 0.5 mg q 3 hours PRN VTE prophylaxis: Lovenox, SCDs ID:  Ancef 2gm post op Foley/Lines:  No foley, KVO IVFs Impediments to Fracture Healing: Vit D level 52, no additional supplementation needed Dispo: PT/OT eval, dispo pending. Patient would prefer to go home as able. Okay for discharge from ortho standpoint once cleared by medicine team and therapies  D/C recommendations: - oxycodone for pain control - aspirin for DVT prophylaxis - no additional need for Vit D supplementation  Follow - up plan: 2 weeks after d/c for wound check and repeat x-rays   Contact information:  Truitt Merle MD, Thyra Breed PA-C. After hours and holidays please check [email or web address] for group call information for [facility]   Thompson Caul, PA-C [phone] (office) [email or web address]

## 2022-12-07 NOTE — Progress Notes (Signed)
Physical Therapy Treatment Patient Details Name: Derek [name] MRN: [id number] DOB: 10-16-1945 Today's Date: 12/07/2022   History of Present Illness 77 yo male with onset of mechanical fall, two days before admission on 4/20.  Pt is now WBAT after sx 4/21 for screws and plate for LLE distal periprosthetic fracture.  Had suspected AVN on L hip as well.   PMHx:  GERD, obesity, L TKA, TLIF L4-5, ACDF, OA,    PT Comments    Pt seen for PT tx with pt agreeable to tx. Pt does not recall PT session from yesterday & continues to focus on what he was able to do in regards to functional mobility, prior to admission. On this date, pt is able to complete bed mobility with min assist with heavy reliance on hospital bed features (would likely require max assist from standard bed). Pt requires significantly extra time to complete bed>recliner, attempting with various methods (stand pivot with RW, stand pviot without AD) but ultimately successful with squat pivot from elevated surface with mod assist but heavy cuing & supervision. Pt with decreased awareness re: CLOF & need for assistance, still adamant he will not go to rehab at d/c despite this being PT's current recommendation. Pt is currently at a high risk for falls with limited mobility.    Recommendations for follow up therapy are one component of a multi-disciplinary discharge planning process, led by the attending physician.  Recommendations may be updated based on patient status, additional functional criteria and insurance authorization.  Follow Up Recommendations  Can patient physically be transported by private vehicle: No    Assistance Recommended at Discharge Frequent or constant Supervision/Assistance  Patient can return home with the following Two people to help with walking and/or transfers;Two people to help with bathing/dressing/bathroom;Assistance with cooking/housework;Direct supervision/assist for medications management;Direct  supervision/assist for financial management;Assist for transportation;Help with stairs or ramp for entrance   Equipment Recommendations  Rolling walker (2 wheels);Wheelchair cushion (measurements PT);BSC/3in1;Wheelchair (measurements PT) (potentially hoyer lift with sling if pt wants to go home)    Recommendations for Other Services       Precautions / Restrictions Precautions Precautions: Fall Restrictions Weight Bearing Restrictions: Yes LLE Weight Bearing: Weight bearing as tolerated     Mobility  Bed Mobility Overal bed mobility: Needs Assistance Bed Mobility: Supine to Sit     Supine to sit: Min assist, HOB elevated     General bed mobility comments: Pt requires significantly extra time, use of bed rails & HOB significantly elevated to complete supine>sit with pt ultimately also holding to PT's and to upright himself. Would likely require max assist from standard bed.    Transfers Overall transfer level: Needs assistance Equipment used: Rolling walker (2 wheels), None Transfers: Sit to/from Stand Sit to Stand: Mod assist           General transfer comment: Pt able to partially come to standing from elevated EOB; i.e. able to clear buttocks but unable to upright trunk nor shift pelvis anteriorly. PT provides cuing for hand placement during STS but pt continuing to pull/lean on RW to transfer STS. After multiple attempts for bed>recliner pt finally able to complete with min/mod assist with squat pivot with multiple scoots but poor overall balance, awareness, & requiring max cuing & supervision for safety.    Ambulation/Gait                   Optometrist  Modified Rankin (Stroke Patients Only)       Balance Overall balance assessment: Needs assistance, History of Falls Sitting-balance support: Feet supported, Bilateral upper extremity supported Sitting balance-Leahy Scale: Fair     Standing balance support:  Bilateral upper extremity supported, During functional activity Standing balance-Leahy Scale: Zero                              Cognition Arousal/Alertness: Awake/alert Behavior During Therapy: Impulsive Overall Cognitive Status: No family/caregiver present to determine baseline cognitive functioning                                 General Comments: Pt follows commands with extra time, poor awareness of deficits & need for assistance, continues to state mobility tasks he could do prior to admission        Exercises      General Comments        Pertinent Vitals/Pain Pain Assessment Pain Assessment: No/denies pain ("that leg don't hurt")    Home Living                          Prior Function            PT Goals (current goals can now be found in the care plan section) Acute Rehab PT Goals Patient Stated Goal: To go directly home PT Goal Formulation: With patient Time For Goal Achievement: 12/20/22 Potential to Achieve Goals: Fair Progress towards PT goals: Progressing toward goals    Frequency    Min 4X/week      PT Plan Current plan remains appropriate    Co-evaluation              AM-PAC PT "6 Clicks" Mobility   Outcome Measure  Help needed turning from your back to your side while in a flat bed without using bedrails?: A Lot Help needed moving from lying on your back to sitting on the side of a flat bed without using bedrails?: Total Help needed moving to and from a bed to a chair (including a wheelchair)?: A Lot Help needed standing up from a chair using your arms (e.g., wheelchair or bedside chair)?: A Lot Help needed to walk in hospital room?: Total Help needed climbing 3-5 steps with a railing? : Total 6 Click Score: 9    End of Session Equipment Utilized During Treatment: Gait belt Activity Tolerance: Patient tolerated treatment well Patient left: in chair;with call bell/phone within reach;with chair  alarm set Nurse Communication: Mobility status PT Visit Diagnosis: Unsteadiness on feet (R26.81);Muscle weakness (generalized) (M62.81);Difficulty in walking, not elsewhere classified (R26.2);Other abnormalities of gait and mobility (R26.89);Repeated falls (R29.6);History of falling (Z91.81)     Time: 2595-6387 PT Time Calculation (min) (ACUTE ONLY): 28 min  Charges:  $Therapeutic Activity: 23-37 mins                     Aleda Grana, PT, DPT 12/07/22, 2:29 PM   Sandi Mariscal 12/07/2022, 2:25 PM

## 2022-12-07 NOTE — Evaluation (Signed)
Occupational Therapy Evaluation Patient Details Name: Derek [name] MRN: [id number] DOB: [birth year] Today's Date: 12/07/2022   History of Present Illness 77 yo male with onset of mechanical fall, two days before admission on 4/20.  Pt is now WBAT after sx 4/21 for screws and plate for LLE distal periprosthetic fracture.  Had suspected AVN on L hip as well.   PMHx:  GERD, obesity, L TKA, TLIF L4-5, ACDF, OA,   Clinical Impression   Pt is s/p above surgery. Pt motivated to improve function to PLOF of mod I for ADLs/mobility with rollator/adaptive equipment. Pt currently requires significant assistance for mobility and transfers, unable to stand up straight or take steps, mod A for transfers, decreased safety awareness with use of RW which increases fall risk.  Pt refusing post acute rehab, but OT recommends >3hrs/day of therapy to improve function prior to return home.  Pt adamant that his two friends at home will be able to help as needed, and HH aide 3X/wk.  Pt would benefit from skilled HHOT follow up if going home. Pt will be seen acutely during stay to improve functional independence and safety awareness.     Recommendations for follow up therapy are one component of a multi-disciplinary discharge planning process, led by the attending physician.  Recommendations may be updated based on patient status, additional functional criteria and insurance authorization.   Assistance Recommended at Discharge Frequent or constant Supervision/Assistance  Patient can return home with the following A lot of help with walking and/or transfers;A little help with bathing/dressing/bathroom;Assistance with cooking/housework;Assist for transportation;Help with stairs or ramp for entrance    Functional Status Assessment  Patient has had a recent decline in their functional status and demonstrates the ability to make significant improvements in function in a reasonable and predictable amount of time.  Equipment  Recommendations  None recommended by OT    Recommendations for Other Services       Precautions / Restrictions Precautions Precautions: Fall Restrictions Weight Bearing Restrictions: Yes LLE Weight Bearing: Weight bearing as tolerated      Mobility Bed Mobility Overal bed mobility: Needs Assistance             General bed mobility comments: Pt in chair when arrived, performed transfers but did not perform bed mobility    Transfers Overall transfer level: Needs assistance Equipment used: Rolling walker (2 wheels) Transfers: Sit to/from Stand Sit to Stand: Mod assist           General transfer comment: Pt able to perform STS with mod A, multiple attempts with instructions for hand placement, Pt unable to follow directions due to decreased strength, uses front bar of RW despite cueing for safety risk.      Balance Overall balance assessment: Needs assistance, History of Falls Sitting-balance support: Feet supported, Bilateral upper extremity supported Sitting balance-Leahy Scale: Fair     Standing balance support: Bilateral upper extremity supported, During functional activity, Reliant on assistive device for balance Standing balance-Leahy Scale: Poor                             ADL either performed or assessed with clinical judgement   ADL Overall ADL's : Needs assistance/impaired Eating/Feeding: Set up   Grooming: Set up;Sitting   Upper Body Bathing: Minimal assistance   Lower Body Bathing: Moderate assistance   Upper Body Dressing : Minimal assistance   Lower Body Dressing: Sitting/lateral leans;Moderate assistance Lower Body Dressing Details (indicate cue  type and reason): unable to perform with sit to stand, uses lateral leans to complete task Toilet Transfer: Moderate assistance   Toileting- Clothing Manipulation and Hygiene: Minimal assistance;Sitting/lateral lean   Tub/ Shower Transfer: Moderate assistance   Functional mobility  during ADLs: Moderate assistance General ADL Comments: Pt able to complete LB dressing with lateral leans and using sock aides/reacher with min A, unable to perform standing ADLs, poor balance and safety awareness with use of RW.     Vision Baseline Vision/History: 0 No visual deficits Ability to See in Adequate Light: 0 Adequate Patient Visual Report: No change from baseline       Perception     Praxis      Pertinent Vitals/Pain Pain Assessment Pain Assessment: 0-10 Pain Score: 1  Faces Pain Scale: Hurts a little bit Pain Location: L hip with movement Pain Descriptors / Indicators: Guarding, Sore Pain Intervention(s): Monitored during session     Hand Dominance Right   Extremity/Trunk Assessment Upper Extremity Assessment Upper Extremity Assessment: RUE deficits/detail;LUE deficits/detail RUE Deficits / Details: B shoulder stiffness, ABD/flexion AROM up to 90 degrees, PROM up to 120, some RTC weakness, possible tears making it difficult to perform UB ADLs, reaching/overhead activities. RUE Sensation: WNL RUE Coordination: decreased fine motor LUE Deficits / Details: B shoulder stiffness, ABD/flexion AROM up to 90 degrees, PROM up to 120, some RTC weakness, possible tears making it difficult to perform UB ADLs, reaching/overhead activities. LUE Sensation: history of peripheral neuropathy LUE Coordination: decreased fine motor   Lower Extremity Assessment Lower Extremity Assessment: Defer to PT evaluation       Communication Communication Communication: No difficulties   Cognition Arousal/Alertness: Awake/alert Behavior During Therapy: WFL for tasks assessed/performed Overall Cognitive Status: No family/caregiver present to determine baseline cognitive functioning                                 General Comments: Pt unable to follow instructions for correct hand placement using RW/chair arm rests for safety after repeated attempts, pulls up on RW,  continues to state he would rather use his rollator because it's heavier.     General Comments       Exercises     Shoulder Instructions      Home Living Family/patient expects to be discharged to:: Private residence Living Arrangements: Non-relatives/Friends Available Help at Discharge: Friend(s);Available PRN/intermittently Type of Home: House Home Access: Ramped entrance     Home Layout: One level     Bathroom Shower/Tub: Chief Strategy Officer: Standard     Home Equipment: Rollator (4 wheels);Shower seat;BSC/3in1;Grab bars - tub/shower;Hand held shower head;Wheelchair - manual   Additional Comments: Pt states he has two friends at home who can assist 24/7 if needed      Prior Functioning/Environment Prior Level of Function : Needs assist       Physical Assist : Mobility (physical) Mobility (physical): Gait   Mobility Comments: rollator walker previously ADLs Comments: uses sock aide/reacher for dressing, shower chair, rollator        OT Problem List: Decreased strength;Decreased range of motion;Decreased activity tolerance;Impaired balance (sitting and/or standing);Impaired UE functional use;Pain;Decreased safety awareness      OT Treatment/Interventions: Self-care/ADL training;Therapeutic exercise;DME and/or AE instruction;Energy conservation;Therapeutic activities    OT Goals(Current goals can be found in the care plan section) Acute Rehab OT Goals Patient Stated Goal: to return home OT Goal Formulation: With patient Time For Goal Achievement:  12/21/22 Potential to Achieve Goals: Good  OT Frequency: Min 2X/week    Co-evaluation              AM-PAC OT "6 Clicks" Daily Activity     Outcome Measure Help from another person eating meals?: A Little Help from another person taking care of personal grooming?: A Little Help from another person toileting, which includes using toliet, bedpan, or urinal?: A Little Help from another person  bathing (including washing, rinsing, drying)?: A Lot Help from another person to put on and taking off regular upper body clothing?: A Little Help from another person to put on and taking off regular lower body clothing?: A Little 6 Click Score: 17   End of Session Equipment Utilized During Treatment: Gait belt;Rolling walker (2 wheels) Nurse Communication: Mobility status  Activity Tolerance: Patient tolerated treatment well Patient left: in chair;with chair alarm set;with call bell/phone within reach  OT Visit Diagnosis: Unsteadiness on feet (R26.81);Other abnormalities of gait and mobility (R26.89);Muscle weakness (generalized) (M62.81);Pain Pain - Right/Left: Left Pain - part of body: Leg                Time: 1191-4782 OT Time Calculation (min): 27 min Charges:  OT General Charges $OT Visit: 1 Visit OT Evaluation $OT Eval Moderate Complexity: 1 Mod OT Treatments $Self Care/Home Management : 8-22 mins  [place], OTR/L   Alexis Goodell 12/07/2022, 3:51 PM

## 2022-12-07 NOTE — TOC Initial Note (Signed)
Transition of Care [facility]) - Initial/Assessment Note    Patient Details  Name: Derek [name] MRN: [id number] Date of Birth: 09-26-1945  Transition of Care [facility]) CM/SW Contact:    Lawerance Sabal, RN Phone Number: 12/07/2022, 11:35 AM  Clinical Narrative:                  Spoke w CSW, patient has declined SNF. Spoke w patient at bedside, he has confirmed that he wants to go home.   He has two roommates and an aid from [employer] 3 times a week for support.  He is agreeable to adding [facility] services, and has no preference for provider. Referral has been accepted by [facility]. Will need HH orders.  DME He has RW, shower seat at home. We discussed a 3/1 toilet seat riser and he states that he has been doing well without it here and declines it for his bathroom at home. He is confident he won't need one for now.   Expected Discharge Plan: Home w Home Health Services Barriers to Discharge: Continued Medical Work up   Patient Goals and CMS Choice Patient states their goals for this hospitalization and ongoing recovery are:: to go home CMS Medicare.gov Compare Post Acute Care list provided to:: Patient Choice offered to / list presented to : Patient      Expected Discharge Plan and Services   Discharge Planning Services: CM Consult Post Acute Care Choice: Home Health Living arrangements for the past 2 months: Single Family Home                 DME Arranged: N/A         HH Arranged: PT, OT HH Agency: [facility] Date [facility] Agency Contacted: 12/07/22 Time HH Agency Contacted: 1135 Representative spoke with at [facility] Agency: Kandee Keen  Prior Living Arrangements/Services Living arrangements for the past 2 months: Single Family Home Lives with:: Roommate                   Activities of Daily Living Home Assistive Devices/Equipment: Environmental consultant (specify type) ADL Screening (condition at time of admission) Patient's cognitive ability adequate to safely complete daily activities?: Yes Is  the patient deaf or have difficulty hearing?: No Does the patient have difficulty seeing, even when wearing glasses/contacts?: No Does the patient have difficulty concentrating, remembering, or making decisions?: No Patient able to express need for assistance with ADLs?: Yes Does the patient have difficulty dressing or bathing?: Yes Independently performs ADLs?: No Does the patient have difficulty walking or climbing stairs?: Yes Weakness of Legs: Both Weakness of Arms/Hands: Both  Permission Sought/Granted                  Emotional Assessment              Admission diagnosis:  Closed fracture of left distal femur [S72.402A] Peri-prosthetic femoral shaft fracture [[id number], Z96.649] Patient Active Problem List   Diagnosis Date Noted   Closed fracture of left distal femur 12/04/2022   Spondylolisthesis, lumbar region 12/22/2021   Lumbar stenosis 10/20/2021   Stenosis of cervical spine with myelopathy 05/06/2021   Status post total left knee replacement 07/22/2020   Unilateral primary osteoarthritis, left knee 03/24/2020   Knee pain, chronic 06/15/2016   GERD (gastroesophageal reflux disease)    Morbid obesity (HCC)    Urethritis 03/20/2013   PCP:  Dettinger, Elige Radon, MD Pharmacy:   [facility] [address], [place] - [address] [place] [address] Phone: [phone] Fax: [phone]     Social Determinants of Health (SDOH) Social History: SDOH Screenings   Food Insecurity: No Food Insecurity (12/05/2022)  Housing: Low Risk  (12/05/2022)  Transportation Needs: No Transportation Needs (12/05/2022)  Utilities: Not At Risk (12/05/2022)  Alcohol Screen: Low Risk  (12/22/2021)  Depression (PHQ2-9): Low Risk  (03/17/2022)  Financial Resource Strain: Medium Risk (12/22/2021)  Physical Activity: Sufficiently Active (12/22/2021)  Social Connections: Moderately Integrated (12/22/2021)  Stress: No Stress Concern Present (12/22/2021)  Tobacco Use:  Low Risk  (12/06/2022)   SDOH Interventions:     Readmission Risk Interventions     No data to display

## 2022-12-07 NOTE — Progress Notes (Addendum)
PROGRESS NOTE    Derek [name]  ZOX:[id number] DOB: 1946-05-21 DOA: 12/04/2022 PCP: Dettinger, Elige Radon, MD   Brief Narrative:  77 y.o. male with medical history significant of GERD, obesity, history of left total knee arthroplasty presented with fall and left knee pain.  He was found to have closed fracture of left distal femur.  He underwent surgical intervention by orthopedics on 12/06/2022.  Assessment & Plan:   Closed fracture of left distal femur with history of left TKA after a fall -Underwent ORIF by orthopedics on 12/07/2022.  Pain management.  Wound care/DVT prophylaxis/activity as per orthopedics recommendations -Fall precautions -PT recommended SNF placement.  TOC consult  GERD -Continue PPI  Normocytic anemia -Questionable cause.   Hemoglobin stable.  Monitor intermittently  Hyponatremia -Mild.  Encourage oral intake.  Monitor intermittently  Obesity -Outpatient follow-up   DVT prophylaxis: Lovenox Code Status: Full Family Communication: None at bedside Disposition Plan: Status is: Inpatient Remains inpatient appropriate because: Of severity of illness.  Need for SNF placement   Consultants: Orthopedics  Procedures: ORIF on 12/07/2022  Antimicrobials: Perioperative   Subjective: Patient seen and examined at bedside.  Has intermittent left leg pain.  No fever, vomiting, shortness of breath reported.   Objective: Vitals:   12/06/22 1249 12/06/22 2228 12/06/22 2351 12/07/22 0424  BP: 112/72 135/85 113/72 116/81  Pulse: 82 79 77 81  Resp: Temp: 97.9 F (36.6 C) 98.8 F (37.1 C) 98 F (36.7 C) 98 F (36.7 C)  TempSrc: Oral     SpO2: 96% 94% 94% 93%  Weight:      Height:        Intake/Output Summary (Last 24 hours) at 12/07/2022 0753 Last data filed at 12/06/2022 2230 Gross per 24 hour  Intake 650 ml  Output 400 ml  Net 250 ml    Filed Weights   12/04/22 1327  Weight: 108.9 kg    Examination:  General exam: No acute  distress.  On room air.   Respiratory system: Bilateral decreased breath sounds at bases with some scattered crackles Cardiovascular system: S1-S2 heard; rate mostly controlled  gastrointestinal system: Abdomen is mildly distended; soft and nontender.  Bowel sounds normally heard  extremities: No cyanosis or clubbing  Data Reviewed: I have personally reviewed following labs and imaging studies  CBC: Recent Labs  Lab 12/04/22 1430 12/06/22 1157 12/07/22 0139  WBC 8.9 9.5 8.3  NEUTROABS 6.5  --   --   HGB 11.3* 10.8* 9.9*  HCT 34.5* 32.8* 30.0*  MCV 97.7 98.8 97.4  PLT 198 189 190    Basic Metabolic Panel: Recent Labs  Lab 12/04/22 1430 12/06/22 1157 12/07/22 0139  NA 134*  --  132*  K 4.0  --  3.8  CL 101  --  100  CO2 23  --  23  GLUCOSE 106*  --  126*  BUN 16  --  21  CREATININE 0.80 0.85 1.02  CALCIUM 9.4  --  9.1    GFR: Estimated Creatinine Clearance: 72.4 mL/min (by C-G formula based on SCr of 1.02 mg/dL). Liver Function Tests: No results for input(s): "AST", "ALT", "ALKPHOS", "BILITOT", "PROT", "ALBUMIN" in the last 168 hours. No results for input(s): "LIPASE", "AMYLASE" in the last 168 hours. No results for input(s): "AMMONIA" in the last 168 hours. Coagulation Profile: Recent Labs  Lab 12/04/22 1516  INR 1.2    Cardiac Enzymes: No results for input(s): "CKTOTAL", "CKMB", "CKMBINDEX", "TROPONINI" in the last 168 hours.  BNP (last 3 results) No results for input(s): "PROBNP" in the last 8760 hours. HbA1C: No results for input(s): "HGBA1C" in the last 72 hours. CBG: No results for input(s): "GLUCAP" in the last 168 hours. Lipid Profile: No results for input(s): "CHOL", "HDL", "LDLCALC", "TRIG", "CHOLHDL", "LDLDIRECT" in the last 72 hours. Thyroid Function Tests: No results for input(s): "TSH", "T4TOTAL", "FREET4", "T3FREE", "THYROIDAB" in the last 72 hours. Anemia Panel: No results for input(s): "VITAMINB12", "FOLATE", "FERRITIN", "TIBC", "IRON",  "RETICCTPCT" in the last 72 hours. Sepsis Labs: No results for input(s): "PROCALCITON", "LATICACIDVEN" in the last 168 hours.  Recent Results (from the past 240 hour(s))  Surgical pcr screen     Status: None   Collection Time: 12/05/22  4:02 PM   Specimen: Nasal Mucosa; Nasal Swab  Result Value Ref Range Status   MRSA, PCR NEGATIVE NEGATIVE Final   Staphylococcus aureus NEGATIVE NEGATIVE Final    Comment: (NOTE) The Xpert SA Assay (FDA approved for NASAL specimens in patients 46 years of age and older), is one component of a comprehensive surveillance program. It is not intended to diagnose infection nor to guide or monitor treatment. Performed at [facility], [address] [place], [place] [address]          Radiology Studies: DG Knee Left Port  Result Date: 12/06/2022 CLINICAL DATA:  Fracture. EXAM: PORTABLE LEFT KNEE - 1-2 VIEW COMPARISON:  Left knee radiographs 12/04/2022 FINDINGS: Redemonstration of total left knee arthroplasty. New lateral plate and screw plate fixation of the previously seen acute periprosthetic fracture of the distal left femur. Minimal lateral and posterior fracture cortical step-off is similar to prior. The most proximal aspect of the new plate hardware is not imaged. No hardware complication is seen. Large joint effusion. Mild intra-articular air consistent with recent surgery. IMPRESSION: New lateral plate and screw plate fixation of the previously seen acute periprosthetic fracture of the distal left femur. Electronically Signed   By: Neita Garnet M.D.   On: 12/06/2022 09:38   DG FEMUR MIN 2 VIEWS LEFT  Result Date: 12/06/2022 CLINICAL DATA:  ORIF distal femur fracture EXAM: LEFT FEMUR 2 VIEWS COMPARISON:  12/04/2022 FINDINGS: Multiple C-arm images show lateral plate and screw fixation of a distal femoral fracture. Apparent restoration of anatomic position and alignment. IMPRESSION: Good appearance following ORIF of a distal femoral fracture.  Electronically Signed   By: Paulina Fusi M.D.   On: 12/06/2022 08:49   DG C-Arm 1-60 Min-No Report  Result Date: 12/06/2022 Fluoroscopy was utilized by the requesting physician.  No radiographic interpretation.        Scheduled Meds:  acetaminophen  1,000 mg Oral Q6H   celecoxib  200 mg Oral BID   cholecalciferol  1,000 Units Oral Daily   docusate sodium  100 mg Oral BID   enoxaparin (LOVENOX) injection  40 mg Subcutaneous Q24H   loratadine  10 mg Oral Daily   pantoprazole  40 mg Oral Daily   Continuous Infusions:  sodium chloride 10 mL/hr at 12/06/22 0958    ceFAZolin (ANCEF) IV 2 g (12/06/22 2237)   methocarbamol (ROBAXIN) IV            Glade Lloyd, MD [facility] 12/07/2022, 7:53 AM

## 2022-12-07 NOTE — Care Management Important Message (Signed)
Important Message  Patient Details  Name: Derek [name] MRN: [id number] Date of Birth: 1946-08-10   Medicare Important Message Given:  Yes     Sherilyn Banker 12/07/2022, 1:32 PM

## 2022-12-07 NOTE — Plan of Care (Signed)

## 2022-12-08 ENCOUNTER — Other Ambulatory Visit (HOSPITAL_COMMUNITY): Payer: Self-pay

## 2022-12-08 DIAGNOSIS — S72492A Other fracture of lower end of left femur, initial encounter for closed fracture: Secondary | ICD-10-CM | POA: Diagnosis not present

## 2022-12-08 LAB — BASIC METABOLIC PANEL
Anion gap: 7 (ref 5–15)
BUN: 20 mg/dL (ref 8–23)
CO2: 25 mmol/L (ref 22–32)
Calcium: 9.1 mg/dL (ref 8.9–10.3)
Chloride: 102 mmol/L (ref 98–111)
Creatinine, Ser: 0.8 mg/dL (ref 0.61–1.24)
GFR, Estimated: >60 mL/min (ref >60)
Glucose, Bld: 104 mg/dL — ABNORMAL HIGH (ref 70–99)
Potassium: 3.7 mmol/L (ref 3.5–5.1)
Sodium: 134 mmol/L — ABNORMAL LOW (ref 135–145)

## 2022-12-08 LAB — CBC
HCT: 27.1 % — ABNORMAL LOW (ref 39.0–52.0)
Hemoglobin: 9 g/dL — ABNORMAL LOW (ref 13.0–17.0)
MCH: 31.8 pg (ref 26.0–34.0)
MCHC: 33.2 g/dL (ref 30.0–36.0)
MCV: 95.8 fL (ref 80.0–100.0)
Platelets: 185 10*3/uL (ref 150–400)
RBC: 2.83 MIL/uL — ABNORMAL LOW (ref 4.22–5.81)
RDW: 12.9 % (ref 11.5–15.5)
WBC: 7.7 10*3/uL (ref 4.0–10.5)
nRBC: 0 % (ref 0.0–0.2)

## 2022-12-08 LAB — MAGNESIUM: Magnesium: 2.2 mg/dL (ref 1.7–2.4)

## 2022-12-08 MED ORDER — OXYCODONE HCL 5 MG PO TABS
5.0000 mg | ORAL_TABLET | Freq: Four times a day (QID) | ORAL | 0 refills | Status: DC | PRN
  Filled 2022-12-08: qty 15, 2d supply, fill #0

## 2022-12-08 MED ORDER — ASPIRIN 325 MG PO TBEC: 325.0000 mg | DELAYED_RELEASE_TABLET | Freq: Every day | ORAL | 0 refills | Status: AC

## 2022-12-08 NOTE — TOC Transition Note (Addendum)
Transition of Care [facility]) - CM/SW Discharge Note   Patient Details  Name: Derek [name] MRN: [id number] Date of Birth: 03-22-1946  [facility] CM/SW Contact:  Leone Haven, RN Phone Number: 12/08/2022, 12:18 PM   Clinical Narrative:    Patient is for dc today, he states he has a rolling walker, w/chair at home, he declines the bsc/3n1 per previous NCM note,  This NCM notied [place] with [place] of the dc today.  Patient states he needs ambulance transport home. Tammy Sours CSW will set up transport for patient.      Barriers to Discharge: Continued Medical Work up   Patient Goals and CMS Choice CMS Medicare.gov Compare Post Acute Care list provided to:: Patient Choice offered to / list presented to : Patient  Discharge Placement                         Discharge Plan and Services Additional resources added to the After Visit Summary for     Discharge Planning Services: CM Consult Post Acute Care Choice: Home Health          DME Arranged: N/A         HH Arranged: PT, OT HH Agency: [facility] Date [facility] Agency Contacted: 12/07/22 Time HH Agency Contacted: 1135 Representative spoke with at [facility] Agency: Kandee Keen  Social Determinants of Health (SDOH) Interventions SDOH Screenings   Food Insecurity: No Food Insecurity (12/05/2022)  Housing: Low Risk  (12/05/2022)  Transportation Needs: No Transportation Needs (12/05/2022)  Utilities: Not At Risk (12/05/2022)  Alcohol Screen: Low Risk  (12/22/2021)  Depression (PHQ2-9): Low Risk  (03/17/2022)  Financial Resource Strain: Medium Risk (12/22/2021)  Physical Activity: Sufficiently Active (12/22/2021)  Social Connections: Moderately Integrated (12/22/2021)  Stress: No Stress Concern Present (12/22/2021)  Tobacco Use: Low Risk  (12/07/2022)     Readmission Risk Interventions     No data to display

## 2022-12-08 NOTE — Progress Notes (Signed)
Orthopaedic Trauma Progress Note  SUBJECTIVE: Doing well this AM.  Pain currently well controlled.  Has not required oxycodone since yesterday morning.  Denies any numbness/tingling.  No chest pain. No SOB. No nausea/vomiting. No other complaints. Patient eager to go home. States he has assistance available at discharge, is not interested in a SNF  OBJECTIVE:  Vitals:   12/08/22 0600 12/08/22 0832  BP: (!) 150/78 116/63  Pulse: (!) 59 72  Resp: 20 18  Temp: 97.7 F (36.5 C) 97.9 F (36.6 C)  SpO2: 95% 96%    General: Resting in bed, NAD Respiratory: No increased work of breathing.  LLE: ace wrap removed, mepilex dressings left in place. No significant tenderness with palpation about the knee. Tolerates gentle knee and ankle ROM. Able to wiggle toes. Compartments soft and compressible. No calf tenderness. +DP pulse  IMAGING: Stable post op imaging.   LABS:  Results for orders placed or performed during the hospital encounter of 12/04/22 (from the past 24 hour(s))  CBC     Status: Abnormal   Collection Time: 12/08/22  2:14 AM  Result Value Ref Range   WBC 7.7 4.0 - 10.5 K/uL   RBC 2.83 (L) 4.22 - 5.81 MIL/uL   Hemoglobin 9.0 (L) 13.0 - 17.0 g/dL   HCT 16.1 (L) 09.6 - 04.5 %   MCV 95.8 80.0 - 100.0 fL   MCH 31.8 26.0 - 34.0 pg   MCHC 33.2 30.0 - 36.0 g/dL   RDW 40.9 81.1 - 91.4 %   Platelets 185 150 - 400 K/uL   nRBC 0.0 0.0 - 0.2 %  Basic metabolic panel     Status: Abnormal   Collection Time: 12/08/22  2:14 AM  Result Value Ref Range   Sodium 134 (L) 135 - 145 mmol/L   Potassium 3.7 3.5 - 5.1 mmol/L   Chloride 102 98 - 111 mmol/L   CO2 25 22 - 32 mmol/L   Glucose, Bld 104 (H) 70 - 99 mg/dL   BUN 20 8 - 23 mg/dL   Creatinine, Ser 7.82 0.61 - 1.24 mg/dL   Calcium 9.1 8.9 - 95.6 mg/dL   GFR, Estimated >21 >30 mL/min   Anion gap 7 5 - 15  Magnesium     Status: None   Collection Time: 12/08/22  2:14 AM  Result Value Ref Range   Magnesium 2.2 1.7 - 2.4 mg/dL     ASSESSMENT: Derek [name] is a 77 y.o. male, 2 Days Post-Op s/p OPEN REDUCTION INTERNAL FIXATION LEFT DISTAL FEMUR FRACTURE  CV/Blood loss: Acute blood loss anemia, Hgb 9.0 this AM. Hemodynamically stable  PLAN: Weightbearing: WBAT LLE ROM: unrestricted ROM  Incisional and dressing care: Okay to leave incisions open to air if no drainage.  Continue to change dressings. Showering:  Ok to begin getting incisions wet 12/09/22 Orthopedic device(s): None  Pain management:  1. Tylenol 1000 mg q 6 hours scheduled 2. Robaxin 500 mg q 6 hours PRN 3. Oxycodone 5-10 mg q 4 hours PRN 4. Dilaudid 0.5 mg q 3 hours PRN VTE prophylaxis: Lovenox, SCDs ID:  Ancef 2gm post op completed Foley/Lines:  No foley, KVO IVFs Impediments to Fracture Healing: Vit D level 52, no additional supplementation needed Dispo: PT/OT eval ongoing. Patient declines SNF and would prefer to go home. Okay for discharge from ortho standpoint once cleared by medicine team and therapies  D/C recommendations: - Oxycodone 5 mg for pain control - Aspirin  325 mg daily x 30 days for DVT  prophylaxis - no additional need for Vit D supplementation  Follow - up plan: 2 weeks after d/c for wound check and repeat x-rays   Contact information:  Truitt Merle MD, Thyra Breed PA-C. After hours and holidays please check [email or web address] for group call information for [facility]   Thompson Caul, PA-C [phone] (office) [email or web address]

## 2022-12-08 NOTE — Progress Notes (Signed)
Occupational Therapy Treatment Patient Details Name: Derek [name] MRN: [id number] DOB: 09/09/1945 Today's Date: 12/08/2022   History of present illness 77 yo male with onset of mechanical fall, two days before admission on 4/20.  Pt is now WBAT after sx 4/21 for screws and plate for LLE distal periprosthetic fracture.  Had suspected AVN on L hip as well.   PMHx:  GERD, obesity, L TKA, TLIF L4-5, ACDF, OA,   OT comments  Pt making slow progress with functional goals. Pt unable to follow instructions for correct hand placement using RW/chair arm rests for safety after repeated attempts, pulls up on RW, continues to state that RW is "wrong". argumentative. Very poor sfatey awareness and poor insight into deficits. Pt still adamant he will not go to rehab at d/c despite this being current recommendation. Pt is currently at a high risk for falls. OT will continue to follow acutely to maximize level of function and safety   Recommendations for follow up therapy are one component of a multi-disciplinary discharge planning process, led by the attending physician.  Recommendations may be updated based on patient status, additional functional criteria and insurance authorization.    Assistance Recommended at Discharge Frequent or constant Supervision/Assistance  Patient can return home with the following  A lot of help with walking and/or transfers;A little help with bathing/dressing/bathroom;Assistance with cooking/housework;Assist for transportation;Help with stairs or ramp for entrance   Equipment Recommendations  None recommended by OT    Recommendations for Other Services      Precautions / Restrictions Precautions Precautions: Fall Restrictions Weight Bearing Restrictions: Yes LLE Weight Bearing: Weight bearing as tolerated       Mobility Bed Mobility Overal bed mobility: Needs Assistance Bed Mobility: Supine to Sit     Supine to sit: Min assist, HOB elevated           Transfers Overall transfer level: Needs assistance Equipment used: Rolling walker (2 wheels) Transfers: Sit to/from Stand Sit to Stand: Mod assist           General transfer comment: Pt able to perform STS with mod A, multiple attempts with instructions for hand placement, Pt unable to follow directions due to decreased strength, uses front bar of RW despite cueing for safety risk.     Balance Overall balance assessment: Needs assistance, History of Falls Sitting-balance support: Feet supported, Bilateral upper extremity supported Sitting balance-Leahy Scale: Fair     Standing balance support: Bilateral upper extremity supported, During functional activity, Reliant on assistive device for balance Standing balance-Leahy Scale: Poor                             ADL either performed or assessed with clinical judgement   ADL Overall ADL's : Needs assistance/impaired     Grooming: Wash/dry hands;Wash/dry face;Set up;Sitting   Upper Body Bathing: Sitting;Min guard Upper Body Bathing Details (indicate cue type and reason): simulated Lower Body Bathing: Minimal assistance;Sitting/lateral leans Lower Body Bathing Details (indicate cue type and reason): simulated Upper Body Dressing : Min guard;Sitting       Toilet Transfer: Moderate assistance;Stand-pivot;BSC/3in1;Cueing for safety   Toileting- Clothing Manipulation and Hygiene: Minimal assistance;Sitting/lateral lean       Functional mobility during ADLs: Moderate assistance;Cueing for safety;Rolling walker (2 wheels)      Extremity/Trunk Assessment Upper Extremity Assessment Upper Extremity Assessment: Generalized weakness;RUE deficits/detail;LUE deficits/detail RUE Deficits / Details: B shoulder stiffness, ABD/flexion AROM up to 90 degrees, PROM up to  120, some RTC weakness, possible tears making it difficult to perform UB ADLs, reaching/overhead activities. LUE Deficits / Details: B shoulder stiffness,  ABD/flexion AROM up to 90 degrees, PROM up to 120, some RTC weakness, possible tears making it difficult to perform UB ADLs, reaching/overhead activities.   Lower Extremity Assessment Lower Extremity Assessment: Defer to PT evaluation        Vision Ability to See in Adequate Light: 0 Adequate Patient Visual Report: No change from baseline     Perception     Praxis      Cognition Arousal/Alertness: Awake/alert Behavior During Therapy: WFL for tasks assessed/performed Overall Cognitive Status: No family/caregiver present to determine baseline cognitive functioning                                 General Comments: Pt unable to follow instructions for correct hand placement using RW/chair arm rests for safety after repeated attempts, pulls up on RW, continues to state he would rather use his rollator because it's heavie, argumentative. Very poor sfatey awareness and poor insight inot deficits. Pt still adamant he will not go to rehab at d/c despite this being current recommendation. Pt is currently at a high risk for falls        Exercises      Shoulder Instructions       General Comments      Pertinent Vitals/ Pain       Pain Assessment Pain Assessment: 0-10 Pain Score: 3  Pain Location: L hip with movement Pain Descriptors / Indicators: Guarding, Sore, Aching Pain Intervention(s): Monitored during session, Repositioned  Home Living                                          Prior Functioning/Environment              Frequency  Min 2X/week        Progress Toward Goals  OT Goals(current goals can now be found in the care plan section)  Progress towards OT goals: Progressing toward goals     Plan Discharge plan remains appropriate    Co-evaluation                 AM-PAC OT "6 Clicks" Daily Activity     Outcome Measure   Help from another person eating meals?: None Help from another person taking care of personal  grooming?: A Little Help from another person toileting, which includes using toliet, bedpan, or urinal?: A Little Help from another person bathing (including washing, rinsing, drying)?: A Little Help from another person to put on and taking off regular upper body clothing?: A Little Help from another person to put on and taking off regular lower body clothing?: A Lot 6 Click Score: 18    End of Session Equipment Utilized During Treatment: Gait belt;Rolling walker (2 wheels);Other (comment) (BSC)  OT Visit Diagnosis: Unsteadiness on feet (R26.81);Other abnormalities of gait and mobility (R26.89);Muscle weakness (generalized) (M62.81);Pain Pain - Right/Left: Left Pain - part of body: Leg   Activity Tolerance Patient tolerated treatment well   Patient Left in chair;with chair alarm set;with call bell/phone within reach   Nurse Communication          Time: 1610-9604 OT Time Calculation (min): 24 min  Charges: OT General Charges $OT Visit: 1 Visit OT Treatments $  Self Care/Home Management : 8-22 mins $Therapeutic Activity: 8-22 mins    Galen Manila 12/08/2022, 12:17 PM

## 2022-12-08 NOTE — Progress Notes (Signed)
TOC med delivered to bedside. PTAR at bedside to transport pt home.

## 2022-12-08 NOTE — Progress Notes (Signed)
Physical Therapy Treatment Patient Details Name: Derek [name] MRN: [id number] DOB: [birth year] Today's Date: 12/08/2022   History of Present Illness 77 yo male with onset of mechanical fall, two days before admission on 4/20.  Pt is now WBAT after sx 4/21 for screws and plate for LLE distal periprosthetic fracture.  Had suspected AVN on L hip as well.   PMHx:  GERD, obesity, L TKA, TLIF L4-5, ACDF, OA,    PT Comments    Pt seen for PT tx with pt agreeable. Pt recalls working with PT yesterday but still presents with decreased memory & impaired awareness & emergent & anticipatory awareness. Pt repeatedly request PT not touch him during transfers and is able to complete STS & stand pivot recliner>bed with close supervision & extra time. Pt with decreased safety awareness as pt transfers to standing then sits on armrests of recliner before fully pivoting to bed. Pt is able to progress to performing STS from EOB x 5 with cuing for hand placement, supervision, and descending height of EOB. Pt engages in LLE strengthening exercises with AAROM/AROM & cuing for technique. Pt still adamant about d/c home vs SNF despite PT recommending SNF. If pt does in fact return home, pt would benefit from maximum HHPT services.    Recommendations for follow up therapy are one component of a multi-disciplinary discharge planning process, led by the attending physician.  Recommendations may be updated based on patient status, additional functional criteria and insurance authorization.  Follow Up Recommendations  Can patient physically be transported by private vehicle: No    Assistance Recommended at Discharge Frequent or constant Supervision/Assistance  Patient can return home with the following Two people to help with walking and/or transfers;Two people to help with bathing/dressing/bathroom;Assistance with cooking/housework;Direct supervision/assist for medications management;Direct supervision/assist for financial  management;Assist for transportation;Help with stairs or ramp for entrance   Equipment Recommendations  Rolling walker (2 wheels);Wheelchair cushion (measurements PT);BSC/3in1;Wheelchair (measurements PT)    Recommendations for Other Services       Precautions / Restrictions Precautions Precautions: Fall Restrictions Weight Bearing Restrictions: Yes LLE Weight Bearing: Weight bearing as tolerated     Mobility  Bed Mobility   Bed Mobility: Sit to Supine       Sit to supine: Min assist   General bed mobility comments: Min assist to elevate LLE onto bed, HOB slightly elevated.    Transfers Overall transfer level: Needs assistance Equipment used: Rolling walker (2 wheels) Transfers: Sit to/from Stand, Bed to chair/wheelchair/BSC Sit to Stand: Supervision, Min guard Stand pivot transfers: Min guard, Supervision         General transfer comment: Pt repeatedly requests PT not touch him. Pt is able to transfer STS from EOB with RW & supervision with cuing for hand placement & sequencing. Pt performs stand pivot recliner>bed on R with RW with 1 hand on RW, 1 hand on bed/recliner 2/2 poor safety awareness & willingness to follow cuing. Pt is able to slightly pivot but comes to sitting on armrest of recliner, stating "this is what you told me to do yesterday" but it was not. Pt was able to complete stand pivot to EOB with close supervision 2/2 pt's request for PT not to touch him to even provide CGA.    Ambulation/Gait                   Stairs             Wheelchair Mobility    Modified Rankin (Stroke Patients  Only)       Balance Overall balance assessment: Needs assistance, History of Falls Sitting-balance support: Feet supported, Bilateral upper extremity supported Sitting balance-Leahy Scale: Fair     Standing balance support: Bilateral upper extremity supported, During functional activity, Reliant on assistive device for balance Standing  balance-Leahy Scale: Poor                              Cognition Arousal/Alertness: Awake/alert Behavior During Therapy: WFL for tasks assessed/performed Overall Cognitive Status: No family/caregiver present to determine baseline cognitive functioning                                 General Comments: Pt recalls working with PT yesterday but not able to accurately report events of session. Pt with decreased safety awareness & emergent & anticipatory awareness.        Exercises General Exercises - Lower Extremity Long Arc Quad: AROM, Strengthening, Left, 10 reps, Seated Hip ABduction/ADduction: AROM, Strengthening, Both, 10 reps (hip adduction pillow squeezes x 10 sitting, hip abduction slides supine AAROM/AROM x 10) Heel Raises: AROM, Strengthening, AAROM, Left, Supine, 10 reps    General Comments        Pertinent Vitals/Pain Pain Assessment Pain Assessment: Faces Faces Pain Scale: Hurts a little bit Pain Location: L hip with movement Pain Descriptors / Indicators: Guarding, Discomfort Pain Intervention(s): Monitored during session    Home Living                          Prior Function            PT Goals (current goals can now be found in the care plan section) Acute Rehab PT Goals Patient Stated Goal: To go directly home PT Goal Formulation: With patient Time For Goal Achievement: 12/20/22 Potential to Achieve Goals: Fair Progress towards PT goals: Progressing toward goals    Frequency    Min 4X/week      PT Plan Current plan remains appropriate    Co-evaluation              AM-PAC PT "6 Clicks" Mobility   Outcome Measure  Help needed turning from your back to your side while in a flat bed without using bedrails?: A Little Help needed moving from lying on your back to sitting on the side of a flat bed without using bedrails?: A Lot Help needed moving to and from a bed to a chair (including a wheelchair)?: A  Little Help needed standing up from a chair using your arms (e.g., wheelchair or bedside chair)?: A Little Help needed to walk in hospital room?: Total Help needed climbing 3-5 steps with a railing? : Total 6 Click Score: 13    End of Session   Activity Tolerance: Patient tolerated treatment well Patient left: in bed;with call bell/phone within reach;with nursing/sitter in room Nurse Communication:  (nurse needs to address IV removal site again 2/2 ongoing bleeding) PT Visit Diagnosis: Unsteadiness on feet (R26.81);Muscle weakness (generalized) (M62.81);Difficulty in walking, not elsewhere classified (R26.2);Other abnormalities of gait and mobility (R26.89);Repeated falls (R29.6);History of falling (Z91.81)     Time: 1478-2956 PT Time Calculation (min) (ACUTE ONLY): 23 min  Charges:  $Therapeutic Exercise: 8-22 mins $Therapeutic Activity: 8-22 mins  Aleda Grana, PT, DPT 12/08/22, 12:37 PM   Sandi Mariscal 12/08/2022, 12:34 PM

## 2022-12-08 NOTE — Discharge Instructions (Signed)
[facility] Discharge Instructions   General Discharge Instructions  WEIGHT BEARING STATUS: Weightbearing as tolerated left lower extremity  RANGE OF MOTION/ACTIVITY: Okay for unrestricted knee range of motion as tolerated  Wound Care: You may remove your surgical dressing on postoperative day #2, (Wednesday, 12/08/2022). Incisions can be left open to air if there is no drainage. Once the incision is completely dry and without drainage, it may be left open to air out.  Showering may begin postoperative day #3, (Thursday, 12/09/2022).  Clean incision gently with soap and water.  DVT/PE prophylaxis: Aspirin 325 mg daily x 30 days  Diet: as you were eating previously.  Can use over the counter stool softeners and bowel preparations, such as Miralax, to help with bowel movements.  Narcotics can be constipating.  Be sure to drink plenty of fluids  PAIN MEDICATION USE AND EXPECTATIONS  You have likely been given narcotic medications to help control your pain.  After a traumatic event that results in an fracture (broken bone) with or without surgery, it is ok to use narcotic pain medications to help control one's pain.  We understand that everyone responds to pain differently and each individual patient will be evaluated on a regular basis for the continued need for narcotic medications. Ideally, narcotic medication use should last no more than 6-8 weeks (coinciding with fracture healing).   As a patient it is your responsibility as well to monitor narcotic medication use and report the amount and frequency you use these medications when you come to your office visit.   We would also advise that if you are using narcotic medications, you should take a dose prior to therapy to maximize you participation.  IF YOU ARE ON NARCOTIC MEDICATIONS IT IS NOT PERMISSIBLE TO OPERATE A MOTOR VEHICLE (MOTORCYCLE/CAR/TRUCK/MOPED) OR HEAVY MACHINERY DO NOT MIX NARCOTICS WITH OTHER CNS (CENTRAL NERVOUS  SYSTEM) DEPRESSANTS SUCH AS ALCOHOL   STOP SMOKING OR USING NICOTINE PRODUCTS!!!!  As discussed nicotine severely impairs your body's ability to heal surgical and traumatic wounds but also impairs bone healing.  Wounds and bone heal by forming microscopic blood vessels (angiogenesis) and nicotine is a vasoconstrictor (essentially, shrinks blood vessels).  Therefore, if vasoconstriction occurs to these microscopic blood vessels they essentially disappear and are unable to deliver necessary nutrients to the healing tissue.  This is one modifiable factor that you can do to dramatically increase your chances of healing your injury.    (This means no smoking, no nicotine gum, patches, etc)  DO NOT USE NONSTEROIDAL ANTI-INFLAMMATORY DRUGS (NSAID'S)  Using products such as Advil (ibuprofen), Aleve (naproxen), Motrin (ibuprofen) for additional pain control during fracture healing can delay and/or prevent the healing response.  If you would like to take over the counter (OTC) medication, Tylenol (acetaminophen) is ok.  However, some narcotic medications that are given for pain control contain acetaminophen as well. Therefore, you should not exceed more than 4000 mg of tylenol in a day if you do not have liver disease.  Also note that there are may OTC medicines, such as cold medicines and allergy medicines that my contain tylenol as well.  If you have any questions about medications and/or interactions please ask your doctor/PA or your pharmacist.      ICE AND ELEVATE INJURED/OPERATIVE EXTREMITY  Using ice and elevating the injured extremity above your heart can help with swelling and pain control.  Icing in a pulsatile fashion, such as 20 minutes on and 20 minutes off, can be followed.  Do not place ice directly on skin. Make sure there is a barrier between to skin and the ice pack.    Using frozen items such as frozen peas works well as the conform nicely to the are that needs to be iced.  USE AN ACE WRAP  OR TED HOSE FOR SWELLING CONTROL  In addition to icing and elevation, Ace wraps or TED hose are used to help limit and resolve swelling.  It is recommended to use Ace wraps or TED hose until you are informed to stop.    When using Ace Wraps start the wrapping distally (farthest away from the body) and wrap proximally (closer to the body)   Example: If you had surgery on your leg or thing and you do not have a splint on, start the ace wrap at the toes and work your way up to the thigh        If you had surgery on your upper extremity and do not have a splint on, start the ace wrap at your fingers and work your way up to the upper arm  CALL THE OFFICE WITH ANY QUESTIONS OR CONCERNS: [phone]   VISIT OUR WEBSITE FOR ADDITIONAL INFORMATION: [email or web address]   Discharge Wound Care Instructions  Do NOT apply any ointments, solutions or lotions to pin sites or surgical wounds.  These prevent needed drainage and even though solutions like hydrogen peroxide kill bacteria, they also damage cells lining the pin sites that help fight infection.  Applying lotions or ointments can keep the wounds moist and can cause them to breakdown and open up as well. This can increase the risk for infection. When in doubt call the office.  Surgical incisions should be dressed daily.  If any drainage is noted, use one layer of adaptic or Mepitel, then gauze, Kerlix, and an ace wrap. - These dressing supplies should be available at local medical supply stores [facility], etc) as well as Insurance claims handler ([facility], [facility], [place], etc)  Once the incision is completely dry and without drainage, it may be left open to air out.  Showering may begin 36-48 hours later.  Cleaning gently with soap and water.

## 2022-12-08 NOTE — Plan of Care (Signed)
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
## 2022-12-08 NOTE — Progress Notes (Signed)
Discharge instructions given. Patient verbalized understanding and all questions were answered.  ?

## 2022-12-09 ENCOUNTER — Telehealth: Payer: Self-pay | Admitting: *Deleted

## 2022-12-09 NOTE — Transitions of Care (Post Inpatient/ED Visit) (Signed)
   12/09/2022  Name: Derek [name] MRN: [id number] DOB: 08-25-1945  Today's TOC FU Call Status: Today's TOC FU Call Status:: Unsuccessul Call (1st Attempt) Unsuccessful Call (1st Attempt) Date: 12/09/22  Attempted to reach the patient regarding the most recent Inpatient/ED visit.  Follow Up Plan: Additional outreach attempts will be made to reach the patient to complete the Transitions of Care (Post Inpatient/ED visit) call.   Demetrios Loll, BSN, RN-BC RN Care Coordinator [facility]  [facility] Direct Dial: [phone] Main #: [phone]

## 2022-12-10 ENCOUNTER — Telehealth: Payer: Self-pay

## 2022-12-10 NOTE — Transitions of Care (Post Inpatient/ED Visit) (Signed)
   12/10/2022  Name: Derek [name] MRN: [id number] DOB: 1946-04-29  Today's TOC FU Call Status: Today's TOC FU Call Status:: Unsuccessful Call (2nd Attempt) Unsuccessful Call (2nd Attempt) Date: 12/10/22  Attempted to reach the patient regarding the most recent Inpatient/ED visit.  Follow Up Plan: Additional outreach attempts will be made to reach the patient to complete the Transitions of Care (Post Inpatient/ED visit) call.   Jodelle Gross, RN, BSN, CCM Care Management Coordinator [place]/[facility] Phone: [phone]/Fax: [phone]

## 2022-12-13 ENCOUNTER — Telehealth: Payer: Self-pay | Admitting: *Deleted

## 2022-12-13 NOTE — Transitions of Care (Post Inpatient/ED Visit) (Signed)
   12/13/2022  Name: Jestin Burbach MRN: [id number] DOB: [birth year]  Today's TOC FU Call Status: Today's TOC FU Call Status:: Unsuccessful Call (3rd Attempt) Unsuccessful Call (3rd Attempt) Date: 12/13/22  Attempted to reach the patient regarding the most recent Inpatient/ED visit.  Follow Up Plan: No further outreach attempts will be made at this time. We have been unable to contact the patient.  Demetrios Loll, BSN, RN-BC RN Care Coordinator [facility]  [facility] Direct Dial: [phone] Main #: [phone]

## 2022-12-14 ENCOUNTER — Other Ambulatory Visit (HOSPITAL_COMMUNITY): Payer: Self-pay

## 2022-12-21 ENCOUNTER — Telehealth: Payer: Self-pay | Admitting: *Deleted

## 2022-12-21 NOTE — Progress Notes (Signed)
  Care Coordination  Outreach Note  12/21/2022 Name: Derek [name] MRN: [id number] DOB: 1945-09-13   Care Coordination Outreach Attempts: An unsuccessful telephone outreach was attempted today to offer the patient information about available care coordination services.  Follow Up Plan:  Additional outreach attempts will be made to offer the patient care coordination information and services.   Encounter Outcome:  No Answer  Christie Nottingham  Care Coordination Care Guide  Direct Dial: [phone]

## 2022-12-24 ENCOUNTER — Ambulatory Visit (INDEPENDENT_AMBULATORY_CARE_PROVIDER_SITE_OTHER): Payer: Medicare HMO

## 2022-12-24 VITALS — Ht 66.0 in | Wt 240.0 lb

## 2022-12-24 DIAGNOSIS — Z0000 Encounter for general adult medical examination without abnormal findings: Secondary | ICD-10-CM | POA: Diagnosis not present

## 2022-12-24 NOTE — Patient Instructions (Signed)
Derek [name] , Thank you for taking time to come for your Medicare Wellness Visit. I appreciate your ongoing commitment to your health goals. Please review the following plan we discussed and let me know if I can assist you in the future.   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 He would like to spend more time doing things he enjoys such as fishing. Goals Addressed             This Visit's Progress    Exercise 3x per week (30 min per time)   On track    Prevent falls   On track          Prevent falls        This is a list of the screening recommended for you and due dates:  Health Maintenance  Topic Date Due   Zoster (Shingles) Vaccine (1 of 2) Never done   DTaP/Tdap/Td vaccine (2 - Td or Tdap) 09/28/2021   Colon Cancer Screening  02/28/2022   COVID-19 Vaccine (4 - 2023-24 season) 04/16/2022   Flu Shot  03/17/2023   Medicare Annual Wellness Visit  08/24/2023   Pneumonia Vaccine  Completed   Hepatitis C Screening: USPSTF Recommendation to screen - Ages 18-79 yo.  Completed   HPV Vaccine  Aged Out    Advanced directives: Advance directive discussed with you today. I have provided a copy for you to complete at home and have notarized. Once this is complete please bring a copy in to our office so we can scan it into your chart.   Conditions/risks identified: Aim for 30 minutes of exercise or brisk walking, 6-8 glasses of water, and 5 servings of fruits and vegetables each day.   Next appointment: Follow up in one year for your annual wellness visit.   Preventive Care 34 Years and Older, Male  Preventive care refers to lifestyle choices and visits with your health care  provider that can promote health and wellness. What does preventive care include? A yearly physical exam. This is also called an annual well check. Dental exams once or twice a year. Routine eye exams. Ask your health care provider how often you should have your eyes checked. Personal lifestyle choices, including: Daily care of your teeth and gums. Regular physical activity. Eating a healthy diet. Avoiding tobacco and drug use. Limiting alcohol use. Practicing safe sex. Taking low doses of aspirin every day. Taking vitamin and mineral supplements as recommended by your health care provider. What happens during an annual well check? The services and screenings done by your health care provider during your annual well check will depend on your age, overall health, lifestyle risk factors, and family history of disease. Counseling  Your health care provider may ask you questions about your: Alcohol use. Tobacco use. Drug use. Emotional well-being. Home and relationship well-being. Sexual activity. Eating habits. History of falls. Memory and ability to understand (cognition). Work and work Astronomer. Screening  You may have the following tests or measurements: Height, weight, and BMI. Blood pressure. Lipid and cholesterol levels. These may be checked every 5  years, or more frequently if you are over 71 years old. Skin check. Lung cancer screening. You may have this screening every year starting at age 42 if you have a 30-pack-year history of smoking and currently smoke or have quit within the past 15 years. Fecal occult blood test (FOBT) of the stool. You may have this test every year starting at age 56. Flexible sigmoidoscopy or colonoscopy. You may have a sigmoidoscopy every 5 years or a colonoscopy every 10 years starting at age 32. Prostate cancer screening. Recommendations will vary depending on your family history and other risks. Hepatitis C blood test. Hepatitis B blood  test. Sexually transmitted disease (STD) testing. Diabetes screening. This is done by checking your blood sugar (glucose) after you have not eaten for a while (fasting). You may have this done every 1-3 years. Abdominal aortic aneurysm (AAA) screening. You may need this if you are a current or former smoker. Osteoporosis. You may be screened starting at age 90 if you are at high risk. Talk with your health care provider about your test results, treatment options, and if necessary, the need for more tests. Vaccines  Your health care provider may recommend certain vaccines, such as: Influenza vaccine. This is recommended every year. Tetanus, diphtheria, and acellular pertussis (Tdap, Td) vaccine. You may need a Td booster every 10 years. Zoster vaccine. You may need this after age 34. Pneumococcal 13-valent conjugate (PCV13) vaccine. One dose is recommended after age 62. Pneumococcal polysaccharide (PPSV23) vaccine. One dose is recommended after age 56. Talk to your health care provider about which screenings and vaccines you need and how often you need them. This information is not intended to replace advice given to you by your health care provider. Make sure you discuss any questions you have with your health care provider. Document Released: 08/29/2015 Document Revised: 04/21/2016 Document Reviewed: 06/03/2015 Elsevier Interactive Patient Education  2017 [employer].  Fall Prevention in the Home Falls can cause injuries. They can happen to people of all ages. There are many things you can do to make your home safe and to help prevent falls. What can I do on the outside of my home? Regularly fix the edges of walkways and driveways and fix any cracks. Remove anything that might make you trip as you walk through a door, such as a raised step or threshold. Trim any bushes or trees on the path to your home. Use bright outdoor lighting. Clear any walking paths of anything that might make  someone trip, such as rocks or tools. Regularly check to see if handrails are loose or broken. Make sure that both sides of any steps have handrails. Any raised decks and porches should have guardrails on the edges. Have any leaves, snow, or ice cleared regularly. Use sand or salt on walking paths during winter. Clean up any spills in your garage right away. This includes oil or grease spills. What can I do in the bathroom? Use night lights. Install grab bars by the toilet and in the tub and shower. Do not use towel bars as grab bars. Use non-skid mats or decals in the tub or shower. If you need to sit down in the shower, use a plastic, non-slip stool. Keep the floor dry. Clean up any water that spills on the floor as soon as it happens. Remove soap buildup in the tub or shower regularly. Attach bath mats securely with double-sided non-slip rug tape. Do not have throw rugs and other things on the  floor that can make you trip. What can I do in the bedroom? Use night lights. Make sure that you have a light by your bed that is easy to reach. Do not use any sheets or blankets that are too big for your bed. They should not hang down onto the floor. Have a firm chair that has side arms. You can use this for support while you get dressed. Do not have throw rugs and other things on the floor that can make you trip. What can I do in the kitchen? Clean up any spills right away. Avoid walking on wet floors. Keep items that you use a lot in easy-to-reach places. If you need to reach something above you, use a strong step stool that has a grab bar. Keep electrical cords out of the way. Do not use floor polish or wax that makes floors slippery. If you must use wax, use non-skid floor wax. Do not have throw rugs and other things on the floor that can make you trip. What can I do with my stairs? Do not leave any items on the stairs. Make sure that there are handrails on both sides of the stairs and  use them. Fix handrails that are broken or loose. Make sure that handrails are as long as the stairways. Check any carpeting to make sure that it is firmly attached to the stairs. Fix any carpet that is loose or worn. Avoid having throw rugs at the top or bottom of the stairs. If you do have throw rugs, attach them to the floor with carpet tape. Make sure that you have a light switch at the top of the stairs and the bottom of the stairs. If you do not have them, ask someone to add them for you. What else can I do to help prevent falls? Wear shoes that: Do not have high heels. Have rubber bottoms. Are comfortable and fit you well. Are closed at the toe. Do not wear sandals. If you use a stepladder: Make sure that it is fully opened. Do not climb a closed stepladder. Make sure that both sides of the stepladder are locked into place. Ask someone to hold it for you, if possible. Clearly mark and make sure that you can see: Any grab bars or handrails. First and last steps. Where the edge of each step is. Use tools that help you move around (mobility aids) if they are needed. These include: Canes. Walkers. Scooters. Crutches. Turn on the lights when you go into a dark area. Replace any light bulbs as soon as they burn out. Set up your furniture so you have a clear path. Avoid moving your furniture around. If any of your floors are uneven, fix them. If there are any pets around you, be aware of where they are. Review your medicines with your doctor. Some medicines can make you feel dizzy. This can increase your chance of falling. Ask your doctor what other things that you can do to help prevent falls. This information is not intended to replace advice given to you by your health care provider. Make sure you discuss any questions you have with your health care provider. Document Released: 05/29/2009 Document Revised: 01/08/2016 Document Reviewed: 09/06/2014 Elsevier Interactive Patient  Education  2017 [employer].

## 2022-12-24 NOTE — Progress Notes (Signed)
  Care Coordination   Note   12/24/2022 Name: Derek [name] MRN: [id number] DOB: 07-Jul-1946  Derek [name] is a 77 y.o. year old male who sees Dettinger, Elige Radon, MD for primary care. I reached out to Derek [name] by phone today to offer care coordination services.  Derek [name] was given information about Care Coordination services today including:   The Care Coordination services include support from the care team which includes your Nurse Coordinator, Clinical Social Worker, or Pharmacist.  The Care Coordination team is here to help remove barriers to the health concerns and goals most important to you. Care Coordination services are voluntary, and the patient may decline or stop services at any time by request to their care team member.   Care Coordination Consent Status: Patient agreed to services and verbal consent obtained.   Follow up plan:  Telephone appointment with care coordination team member scheduled for:  12/29/22  Encounter Outcome:  Pt. Scheduled

## 2022-12-24 NOTE — Progress Notes (Signed)
Subjective:   Derek [name] is a 77 y.o. male who presents for Medicare Annual/Subsequent preventive examination. I connected with  Derek [name] on 12/24/22 by a audio enabled telemedicine application and verified that I am speaking with the correct person using two identifiers.  Patient Location: Home  Provider Location: Home Office  I discussed the limitations of evaluation and management by telemedicine. The patient expressed understanding and agreed to proceed.  Review of Systems     Cardiac Risk Factors include: advanced age (>62men, >54 women);hypertension;male gender;dyslipidemia     Objective:    Today's Vitals   12/24/22 0838  Weight: 240 lb (108.9 kg)  Height: 5\' 6"  (1.676 m)   Body mass index is 38.74 kg/m.     12/24/2022    8:41 AM 12/04/2022    1:28 PM 12/22/2021    9:03 AM 10/16/2021    2:38 PM 05/04/2021   12:24 PM 03/27/2021    2:44 PM 12/19/2020    1:16 PM  Advanced Directives  Does Patient Have a Medical Advance Directive? Yes No Yes No No No No  Type of Estate agent of [place];Living will  Healthcare Power of [place];Living will      Copy of Healthcare Power of Attorney in Chart? No - copy requested  No - copy requested      Would patient like information on creating a medical advance directive?  No - Patient declined  Yes (MAU/Ambulatory/Procedural Areas - Information given) No - Patient declined  Yes (MAU/Ambulatory/Procedural Areas - Information given)    Current Medications (verified) Outpatient Encounter Medications as of 12/24/2022  Medication Sig   aspirin EC 325 MG tablet Take 1 tablet (325 mg total) by mouth daily.   Butenafine HCl 1 % cream Apply 30 g topically 2 (two) times daily.   cetirizine (ZYRTEC) 10 MG tablet Take 10 mg by mouth daily.   cholecalciferol (VITAMIN D) 1000 UNITS tablet Take 1,000 Units by mouth daily.   Nutritional Supplements (PROSTATE PO) Take 1 tablet by mouth 2 (two) times daily.   nystatin cream  (MYCOSTATIN) Apply 1 Application topically 2 (two) times daily.   oxyCODONE (OXY IR/ROXICODONE) 5 MG immediate release tablet Take 1-2 tablets (5-10 mg total) by mouth every 6 (six) hours as needed for moderate pain or severe pain.   trolamine salicylate (ASPERCREME) 10 % cream Apply 1 application topically as needed for muscle pain.   No facility-administered encounter medications on file as of 12/24/2022.    Allergies (verified) Patient has no known allergies.   History: Past Medical History:  Diagnosis Date   Arthritis    left knee   GERD (gastroesophageal reflux disease)    Hypercholesterolemia    Obesity    Urethritis    when he was in [employer]   Past Surgical History:  Procedure Laterality Date   ANTERIOR CERVICAL DECOMP/DISCECTOMY FUSION N/A 05/06/2021   Procedure: Anterior Cervical Discectomy Fusion Cervical Three-Four, Cervical Four-Five;  Surgeon: Bedelia Person, MD;  Location: [facility] OR;  Service: Neurosurgery;  Laterality: N/A;   COLONOSCOPY     ORIF FEMUR FRACTURE Left 12/06/2022   Procedure: OPEN REDUCTION INTERNAL FIXATION (ORIF) DISTAL FEMUR FRACTURE;  Surgeon: Roby Lofts, MD;  Location: [facility] OR;  Service: Orthopedics;  Laterality: Left;   TOTAL KNEE ARTHROPLASTY Left 07/22/2020   Procedure: LEFT TOTAL KNEE ARTHROPLASTY;  Surgeon: Kathryne Hitch, MD;  Location: [facility] OR;  Service: Orthopedics;  Laterality: Left;   TRANSFORAMINAL LUMBAR INTERBODY FUSION (TLIF) WITH PEDICLE SCREW  FIXATION 1 LEVEL N/A 10/20/2021   Procedure: Transforaminal Lumbar Interbody Fusion Lumbar Four-Five;  Surgeon: Bedelia Person, MD;  Location[facility] OR;  Service: Neurosurgery;  Laterality: N/A;  Transforaminal Lumbar Interbody Fusion Lumbar Four-Five   Family History  Problem Relation Age of Onset   Cancer Mother        breast   Diabetes Father    Social History   Socioeconomic History   Marital status: Legally Separated    Spouse name: Not on file   Number of children: 2    Years of education: graduated high school   Highest education level: High school graduate  Occupational History   Occupation: retired  Tobacco Use   Smoking status: Never   Smokeless tobacco: Never  Building services engineer Use: Never used  Substance and Sexual Activity   Alcohol use: No    Alcohol/week: 0.0 standard drinks of alcohol   Drug use: No   Sexual activity: Yes    Birth control/protection: Condom  Other Topics Concern   Not on file  Social History Narrative   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   Right handed   Social Determinants of Health   Financial Resource Strain: Low Risk  (12/24/2022)   Overall Financial Resource Strain (CARDIA)    Difficulty of Paying Living Expenses: Not hard at all  Food Insecurity: No Food Insecurity (12/24/2022)   Hunger Vital Sign    Worried About Running Out of Food in the Last Year: Never true    Ran Out of Food in the Last Year: Never true  Transportation Needs: No Transportation Needs (12/24/2022)   PRAPARE - Administrator, Civil Service (Medical): No    Lack of Transportation (Non-Medical): No  Physical Activity: Inactive (12/24/2022)   Exercise Vital Sign    Days of Exercise per Week: 0 days    Minutes of Exercise per Session: 0 min  Stress: No Stress Concern Present (12/24/2022)   [employer] of Occupational Health - Occupational Stress Questionnaire    Feeling of Stress : Not at all  Social Connections: Socially Isolated (12/24/2022)   Social Connection and Isolation Panel [NHANES]    Frequency of Communication with Friends and Family: More than three times a week    Frequency of Social Gatherings with Friends and Family: More than three times a week    Attends Religious Services: Never    Database administrator or Organizations: No    Attends Engineer, structural: Never    Marital Status: Separated    Tobacco Counseling Counseling given: Not  Answered   Clinical Intake:  Pre-visit preparation completed: Yes  Pain : No/denies pain     Nutritional Risks: None Diabetes: No  How often do you need to have someone help you when you read instructions, pamphlets, or other written materials from your doctor or pharmacy?: 1 - Never  Diabetic?no   Interpreter Needed?: No  Information entered by :: Renie Ora, LPN   Activities of Daily Living    12/24/2022    8:42 AM 12/05/2022   12:32 AM  In your present state of health, do you have any difficulty performing the following activities:  Hearing? 0 0  Vision? 0 0  Difficulty concentrating or making decisions? 0 0  Walking or climbing stairs? 0 1  Dressing or bathing? 0 1  Doing errands, shopping? 0 0  Preparing Food and eating ? N  Using the Toilet? N   In the past six months, have you accidently leaked urine? N   Do you have problems with loss of bowel control? N   Managing your Medications? N   Managing your Finances? N   Housekeeping or managing your Housekeeping? N     Patient Care Team: Dettinger, Elige Radon, MD as PCP - General (Family Medicine) Bedelia Person, MD as Consulting Physician (Neurosurgery)  Indicate any recent Medical Services you may have received from other than [facility] providers in the past year (date may be approximate).     Assessment:   This is a routine wellness examination for [facility].  Hearing/Vision screen Vision Screening - Comments:: Patient  declines referral   Dietary issues and exercise activities discussed: Current Exercise Habits: The patient does not participate in regular exercise at present, Exercise limited by: orthopedic condition(s)   Goals Addressed             This Visit's Progress    DIET - INCREASE WATER INTAKE         Depression Screen    12/24/2022    8:41 AM 03/17/2022   10:12 AM 02/22/2022    2:05 PM 12/22/2021    9:01 AM 08/27/2021   10:04 AM 12/19/2020    1:16 PM 11/17/2020   11:19 AM  PHQ 2/9  Scores  PHQ - 2 Score 0 0 0 0 0 0 0    Fall Risk    12/24/2022    8:39 AM 02/22/2022    2:11 PM 12/22/2021    8:56 AM 08/27/2021   10:04 AM 03/27/2021    2:43 PM  Fall Risk   Falls in the past year? 1 0 1 0 0  Number falls in past yr: 1  1  0  Injury with Fall? 1  0  0  Risk for fall due to : History of fall(s);Impaired balance/gait;Orthopedic patient  History of fall(s);Impaired balance/gait;Orthopedic patient;Medication side effect    Follow up Education provided;Falls prevention discussed  Education provided;Falls prevention discussed      FALL RISK PREVENTION PERTAINING TO THE HOME:  Any stairs in or around the home? No  If so, are there any without handrails? No  Home free of loose throw rugs in walkways, pet beds, electrical cords, etc? Yes  Adequate lighting in your home to reduce risk of falls? Yes   ASSISTIVE DEVICES UTILIZED TO PREVENT FALLS:  Life alert? No  Use of a cane, walker or w/c? Yes  Grab bars in the bathroom? Yes  Shower chair or bench in shower? Yes  Elevated toilet seat or a handicapped toilet? Yes        12/24/2022    8:42 AM 12/22/2021    9:03 AM 12/19/2019   10:17 AM 12/12/2018   11:02 AM  6CIT Screen  What Year? 0 points 0 points 0 points 0 points  What month? 0 points 0 points 0 points 0 points  What time? 0 points 0 points 3 points 0 points  Count back from 20 0 points 0 points 0 points 0 points  Months in reverse 0 points 2 points 4 points 4 points  Repeat phrase 0 points 6 points 2 points 0 points  Total Score 0 points 8 points 9 points 4 points    Immunizations Immunization History  Administered Date(s) Administered   Fluad Quad(high Dose 65+) 05/25/2019   Influenza,inj,Quad PF,6+ Mos 06/25/2014, 06/15/2016   Influenza-Unspecified 06/16/2013, 05/25/2020   Moderna Sars-Covid-2 Vaccination 09/20/2019,  10/18/2019, 04/25/2020   Pneumococcal Conjugate-13 05/25/2019   Pneumococcal Polysaccharide-23 11/17/2020   Tdap 09/29/2011    TDAP  status: Due, Education has been provided regarding the importance of this vaccine. Advised may receive this vaccine at local pharmacy or Health Dept. Aware to provide a copy of the vaccination record if obtained from local pharmacy or Health Dept. Verbalized acceptance and understanding.  Flu Vaccine status: Declined, Education has been provided regarding the importance of this vaccine but patient still declined. Advised may receive this vaccine at local pharmacy or Health Dept. Aware to provide a copy of the vaccination record if obtained from local pharmacy or Health Dept. Verbalized acceptance and understanding.  Pneumococcal vaccine status: Up to date  Covid-19 vaccine status: Completed vaccines  Qualifies for Shingles Vaccine? Yes   Zostavax completed No   Shingrix Completed?: No.    Education has been provided regarding the importance of this vaccine. Patient has been advised to call insurance company to determine out of pocket expense if they have not yet received this vaccine. Advised may also receive vaccine at local pharmacy or Health Dept. Verbalized acceptance and understanding.  Screening Tests Health Maintenance  Topic Date Due   Zoster Vaccines- Shingrix (1 of 2) Never done   DTaP/Tdap/Td (2 - Td or Tdap) 09/28/2021   COLONOSCOPY (Pts 45-84yrs Insurance coverage will need to be confirmed)  02/28/2022   COVID-19 Vaccine (4 - 2023-24 season) 04/16/2022   INFLUENZA VACCINE  03/17/2023   Medicare Annual Wellness (AWV)  12/24/2023   Pneumonia Vaccine 41+ Years old  Completed   Hepatitis C Screening  Completed   HPV VACCINES  Aged Out    Health Maintenance  Health Maintenance Due  Topic Date Due   Zoster Vaccines- Shingrix (1 of 2) Never done   DTaP/Tdap/Td (2 - Td or Tdap) 09/28/2021   COLONOSCOPY (Pts 45-33yrs Insurance coverage will need to be confirmed)  02/28/2022   COVID-19 Vaccine (4 - 2023-24 season) 04/16/2022    Colorectal cancer screening: No longer required.    Lung Cancer Screening: (Low Dose CT Chest recommended if Age 49-80 years, 30 pack-year currently smoking OR have quit w/in 15years.) does not qualify.   Lung Cancer Screening Referral: n/a  Additional Screening:  Hepatitis C Screening: does not qualify;   Vision Screening: Recommended annual ophthalmology exams for early detection of glaucoma and other disorders of the eye. Is the patient up to date with their annual eye exam?  No  Who is the provider or what is the name of the office in which the patient attends annual eye exams? Patient declines referral  If pt is not established with a provider, would they like to be referred to a provider to establish care? No .   Dental Screening: Recommended annual dental exams for proper oral hygiene  Community Resource Referral / Chronic Care Management: CRR required this visit?  No   CCM required this visit?  No      Plan:     I have personally reviewed and noted the following in the patient's chart:   Medical and social history Use of alcohol, tobacco or illicit drugs  Current medications and supplements including opioid prescriptions. Patient is not currently taking opioid prescriptions. Functional ability and status Nutritional status Physical activity Advanced directives List of other physicians Hospitalizations, surgeries, and ER visits in previous 12 months Vitals Screenings to include cognitive, depression, and falls Referrals and appointments  In addition, I have reviewed and discussed with patient certain preventive  protocols, quality metrics, and best practice recommendations. A written personalized care plan for preventive services as well as general preventive health recommendations were provided to patient.     Lorrene Reid, LPN   [id number]/82/[id number]   Nurse Notes: Due Tdap vaccine

## 2022-12-29 ENCOUNTER — Ambulatory Visit: Payer: Self-pay | Admitting: Licensed Clinical Social Worker

## 2022-12-29 NOTE — Patient Outreach (Signed)
  Care Coordination   Initial Visit Note   12/29/2022 Name: Derek [name] MRN: [id number] DOB: 10/19/1945  Derek [name] is a 77 y.o. year old male who sees Dettinger, Elige Radon, MD for primary care. I spoke with  Derek [name] by phone today.  What matters to the patients health and wellness today? Client is recovering from leg injury. He broke  his leg a few weeks ago. He may have some food needs    Goals Addressed             This Visit's Progress    Patient broke his leg a few weeks ago. He said he is walking slowly and trying to recover from injury to leg. He may have some food needs       Interventions:  Spoke with client about client needs Discussed program support with client. 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 Client said he thinks his mood is stable at present Reviewed food needs. Client said he is on waiting list for Meals on Wheels. LCSW suggested client contact [facility] to speak with agency about food support for client Discussed support from spouse Discussed transport needs. Client said he sometimes uses [employer] for transport help. Provided counseling support Discussed support with PCP, Dr Elige Radon Dettinger Encouraged client to call LCSW as needed for SW support at [phone]         SDOH assessments and interventions completed:  Yes  SDOH Interventions Today    Flowsheet Row Most Recent Value  SDOH Interventions   Depression Interventions/Treatment  Counseling  Physical Activity Interventions Other (Comments)  [difficulty in walking]  Stress Interventions Provide Counseling  [has stress related to managing medical needs]        Care Coordination Interventions:  Yes, provided    Interventions Today    Flowsheet Row Most Recent Value  Chronic Disease   Chronic  disease during today's visit Other  [spoke with client about client needs]  General Interventions   General Interventions Discussed/Reviewed General Interventions Discussed, [employer] program support. Client is [employer]. [facility]ed [facility]act [facility] [place], [place] to speak with representative about his current needs]  Exercise Interventions   Exercise Discussed/Reviewed Physical Activity  Physical Activity Discussed/Reviewed Physical Activity Discussed  Education Interventions   Education Provided Provided Education  Provided Verbal Education On Community Resources  Mental Health Interventions   Mental Health Discussed/Reviewed Coping Strategies  [discussed client mood. Client said he thought his mood was stable]  Nutrition Interventions   Nutrition Discussed/Reviewed Nutrition Discussed  [client is on waiting list for Meals on Wheels]  Pharmacy Interventions   Pharmacy Dicussed/Reviewed Pharmacy Topics Discussed  Safety Interventions   Safety Discussed/Reviewed Fall Risk  [client broke his leg a few weeks ago]       Follow up plan: Follow up call scheduled for 02/07/23 at 10:30 AM    Encounter Outcome:  Pt. Visit Completed   [name].Kamaya Keckler MSW, LCSW Licensed Visual merchandiser [facility] Care Management [phone]

## 2022-12-29 NOTE — Patient Instructions (Signed)
Visit Information  Thank you for taking time to visit with me today. Please don't hesitate to contact me if I can be of assistance to you.   Following are the goals we discussed today:   Goals Addressed             This Visit's Progress    Patient broke his leg a few weeks ago. He said he is walking slowly and trying to recover from injury to leg. He may have some food needs       Interventions:  Spoke with client about client needs Discussed program support with client. 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 Client said he thinks his mood is stable at present Reviewed food needs. Client said he is on waiting list for Meals on Wheels. LCSW suggested client contact [facility] to speak with agency about food support for client Discussed support from spouse Discussed transport needs. Client said he sometimes uses [employer] for transport help. Provided counseling support Discussed support with PCP, Dr Elige Radon Dettinger Encouraged client to call LCSW as needed for SW support at [phone]         Our next appointment is by telephone on 02/07/23 at 10:30 AM   Please call the care guide team at [phone] if you need to cancel or reschedule your appointment.   If you are experiencing a Mental Health or Behavioral Health Crisis or need someone to talk to, please go to [facility] [address] [place] [phone])   The patient verbalized understanding of instructions, educational materials, and care plan provided today and DECLINED offer to receive copy of patient instructions, educational materials, and care plan.   The patient has been provided with contact information for the care management team and has been advised to call with any health related questions  or concerns.   [name].Taegan Standage MSW, LCSW Licensed Visual merchandiser [facility] Care Management [phone]

## 2023-01-05 ENCOUNTER — Ambulatory Visit: Payer: Self-pay | Admitting: Licensed Clinical Social Worker

## 2023-01-05 NOTE — Patient Outreach (Signed)
  Care Coordination   Follow Up Visit Note   01/05/2023 Name: Jaetyn Wiggans MRN: [id number] DOB: Sep 25, 1945  Jereld Bickmore is a 77 y.o. year old male who sees Dettinger, Elige Radon, MD for primary care. I spoke with  [name] by phone today.  What matters to the patients health and wellness today?    Patient broke his leg a few weeks ago. He said he is walking slowly and trying to recover from injury to leg. He may have some food needs      Goals Addressed             This Visit's Progress    Patient broke his leg a few weeks ago. He said he is walking slowly and trying to recover from injury to leg. He may have some food needs       Interventions:  LCSW spoke with client via phone today about client needs Discussed program support with client. Informed client of program support with RN, LCSW and Pharmacist Discussed food needs of client. Encouraged client to contact [facility] in [place], [place] to talk with agency about possible food support for client Client said he is on waiting list for Meals on Wheels. He has been on this waiting list for one year and spoke with representative recently. He hopes he will begin receiving Meals on Wheels in near future Discussed transport needs. Client said he  is now driving to and from his appointments Provided counseling support Discussed support with PCP, Dr Elige Radon Dettinger Provided client with LCSW name and phone number. Encouraged client to call LCSW as needed for SW support at [phone]         SDOH assessments and interventions completed:  Yes  SDOH Interventions Today    Flowsheet Row Most Recent Value  SDOH Interventions   Depression Interventions/Treatment  Counseling  Physical Activity Interventions Other (Comments)  [client is receiving physical therapy  support in home as scheduled]  Stress Interventions Provide Counseling  [stress in managing finances. stress in managing food needs]        Care  Coordination Interventions:  Yes, provided   Interventions Today    Flowsheet Row Most Recent Value  Chronic Disease   Chronic disease during today's visit Other  [spoke with client about client needs]  General Interventions   General Interventions Discussed/Reviewed General Interventions Discussed, [employer]  [discussed progam support. Discussed support for client with RN, LCSW, Pharmacist]  Exercise Interventions   Exercise Discussed/Reviewed Physical Activity  [client is receiving physical therapy support in the home as scheduled]  Physical Activity Discussed/Reviewed Physical Activity Discussed  Education Interventions   Education Provided Provided Education  Provided Verbal Education On Community Resources  Mental Health Interventions   Mental Health Discussed/Reviewed Anxiety, Coping Strategies  [discussed client management of anxiety and stress. Has some family support]  Nutrition Interventions   Nutrition Discussed/Reviewed Nutrition Discussed  [LCSW encouraged client to contact [facility] for possible food support for client]  Pharmacy Interventions   Pharmacy Dicussed/Reviewed Pharmacy Topics Discussed       Follow up plan: LCSW to call client as scheduled to assess client needs at that time   Encounter Outcome:  Pt. Visit Completed   [name].Thurston Brendlinger MSW, LCSW Licensed Visual merchandiser [facility] Care Management [phone]

## 2023-01-05 NOTE — Patient Instructions (Signed)
Visit Information  Thank you for taking time to visit with me today. Please don't hesitate to contact me if I can be of assistance to you.   Following are the goals we discussed today:   Goals Addressed             This Visit's Progress    Patient broke his leg a few weeks ago. He said he is walking slowly and trying to recover from injury to leg. He may have some food needs       Interventions:  LCSW spoke with client via phone today about client needs Discussed program support with client. Informed client of program support with RN, LCSW and Pharmacist Discussed food needs of client. Encouraged client to contact [facility] in [place], [place] to talk with agency about possible food support for client Client said he is on waiting list for Meals on Wheels. He has been on this waiting list for one year and spoke with representative recently. He hopes he will begin receiving Meals on Wheels in near future Discussed transport needs. Client said he  is now driving to and from his appointments Provided counseling support Discussed support with PCP, Dr Elige Radon Dettinger Provided client with LCSW name and phone number. Encouraged client to call LCSW as needed for SW support at [phone]         LCSW to call client as scheduled to assess client needs at that time  Please call the care guide team at [phone] if you need to cancel or reschedule your appointment.   If you are experiencing a Mental Health or Behavioral Health Crisis or need someone to talk to, please go to [facility] [address], [place] [phone])   The patient verbalized understanding of instructions, educational materials, and care plan provided today and DECLINED offer to receive copy of patient instructions, educational materials, and care plan.   The patient has been provided with contact information for the care management team and has been advised to call  with any health related questions or concerns.   [name].Derek [name] MSW, LCSW Licensed Visual merchandiser [facility] Care Management [phone]

## 2023-01-06 ENCOUNTER — Telehealth: Payer: Self-pay

## 2023-01-06 NOTE — Telephone Encounter (Signed)
Derek [name] from [facility] aware and she will have them fax orders over to be signed.

## 2023-01-06 NOTE — Telephone Encounter (Signed)
He either needs to come in and be seen here he needs to be seen through home health by the wound care nurse to evaluate.  Or he can be seen at a wound care office but he needs to be seen by some kind of wound care to evaluate.

## 2023-01-06 NOTE — Telephone Encounter (Signed)
Rosalita Chessman with [facility] called stating that she went to see patient today and he has a large blister on his left heel. She states that it was not there Tuesday because she assisted with him with a bath and did not see it. Patient states that he has been feeling it coming on for a few days. Please advise and contact Rosalita Chessman at [phone]

## 2023-01-11 ENCOUNTER — Ambulatory Visit: Payer: Medicare HMO | Admitting: Family Medicine

## 2023-01-12 ENCOUNTER — Encounter: Payer: Self-pay | Admitting: Family Medicine

## 2023-01-24 ENCOUNTER — Encounter: Payer: Self-pay | Admitting: Family Medicine

## 2023-01-24 ENCOUNTER — Ambulatory Visit (INDEPENDENT_AMBULATORY_CARE_PROVIDER_SITE_OTHER): Payer: Medicare HMO | Admitting: Family Medicine

## 2023-01-24 VITALS — BP 116/67 | HR 59 | Ht 66.0 in | Wt 235.0 lb

## 2023-01-24 DIAGNOSIS — S91302A Unspecified open wound, left foot, initial encounter: Secondary | ICD-10-CM

## 2023-01-24 NOTE — Progress Notes (Signed)
BP 116/67   Pulse (!) 59   Ht 5\' 6"  (1.676 m)   Wt 235 lb (106.6 kg)   SpO2 97%   BMI 37.93 kg/m    Subjective:   Patient ID: Derek [name], male    DOB: Jun 08, 1946, 77 y.o.   MRN: [id number]  HPI: Derek [name] is a 77 y.o. male presenting on 01/24/2023 for Blister (Left foot/side of foot. Present for one month. )   HPI Left heel discoloration Patient is coming in today for some changes in his left heel that is noticed that been going on for the past couple weeks.  He says he did get some drainage out of it but now it is just hard and dark and does not seem to be healing.  He does below if he hit it somewhere or why it looks like this.  He says he may have started seen in about 1 month ago but he does have difficulty seeing his own heels.  He says that it is just not healing.  It is not worsening but not healing either.  Relevant past medical, surgical, family and social history reviewed and updated as indicated. Interim medical history since our last visit reviewed. Allergies and medications reviewed and updated.  Review of Systems  Constitutional:  Negative for chills and fever.  Respiratory:  Negative for shortness of breath and wheezing.   Cardiovascular:  Negative for chest pain and leg swelling.  Musculoskeletal:  Negative for back pain and gait problem.  Skin:  Positive for color change. Negative for rash.  All other systems reviewed and are negative.   Per HPI unless specifically indicated above   Allergies as of 01/24/2023   No Known Allergies      Medication List        Accurate as of January 24, 2023 10:53 AM. If you have any questions, ask your nurse or doctor.          STOP taking these medications    oxyCODONE 5 MG immediate release tablet Commonly known as: Oxy IR/ROXICODONE Stopped by: Elige Radon Janique Hoefer, MD       TAKE these medications    Butenafine HCl 1 % cream Apply 30 g topically 2 (two) times daily.   cetirizine 10 MG  tablet Commonly known as: ZYRTEC Take 10 mg by mouth daily.   cholecalciferol 1000 units tablet Commonly known as: VITAMIN D Take 1,000 Units by mouth daily.   nystatin cream Commonly known as: MYCOSTATIN Apply 1 Application topically 2 (two) times daily.   PROSTATE PO Take 1 tablet by mouth 2 (two) times daily.   trolamine salicylate 10 % cream Commonly known as: ASPERCREME Apply 1 application topically as needed for muscle pain.         Objective:   BP 116/67   Pulse (!) 59   Ht 5\' 6"  (1.676 m)   Wt 235 lb (106.6 kg)   SpO2 97%   BMI 37.93 kg/m   Wt Readings from Last 3 Encounters:  01/24/23 235 lb (106.6 kg)  12/24/22 240 lb (108.9 kg)  12/04/22 240 lb (108.9 kg)    Physical Exam Vitals and nursing note reviewed.  Constitutional:      Appearance: Normal appearance.  Skin:    Comments: Dark and hard skin discoloration of left lateral heel, and area of about 5 cm x 3 cm  Neurological:     Mental Status: He is alert.       Assessment & Plan:  Problem List Items Addressed This Visit   None Visit Diagnoses     Non-healing wound of left heel    -  Primary   Relevant Orders   AMB referral to wound care center       Concern for possible dry gangrene, will try to get into wound care as quickly as possible. Follow up plan: Return if symptoms worsen or fail to improve.  Counseling provided for all of the vaccine components Orders Placed This Encounter  Procedures   AMB referral to wound care center    Arville Care, MD [facility] 01/24/2023, 10:53 AM

## 2023-01-27 ENCOUNTER — Ambulatory Visit (INDEPENDENT_AMBULATORY_CARE_PROVIDER_SITE_OTHER): Payer: Medicare HMO | Admitting: Orthopedic Surgery

## 2023-01-27 ENCOUNTER — Encounter: Payer: Self-pay | Admitting: Orthopedic Surgery

## 2023-01-27 DIAGNOSIS — L97421 Non-pressure chronic ulcer of left heel and midfoot limited to breakdown of skin: Secondary | ICD-10-CM | POA: Diagnosis not present

## 2023-01-27 NOTE — Progress Notes (Signed)
Office Visit Note   Patient: Derek [name]           Date of Birth: 08/20/1945           MRN: [id number] Visit Date: 01/27/2023              Requested by: Dettinger, Elige Radon, MD [address] [place],  [place] [address] PCP: Dettinger, Elige Radon, MD  Chief Complaint  Patient presents with   Left Foot - Wound Check      HPI: Patient is a 77 year old gentleman who is status post open reduction internal fixation left femur and is status post a left total knee arthroplasty.  During recovery from the femur fracture patient has developed a black decubitus ulcer on the left heel.  Assessment & Plan: Visit Diagnoses:  1. Non-pressure chronic ulcer of left heel and midfoot limited to breakdown of skin (HCC)     Plan: Patient is placed in a PRAFO boot he will proceed with dry dressing.  Follow-up in 4 weeks anticipate we can start debriding the ulcer surgically in the office.  Follow-Up Instructions: Return in about 4 weeks (around 02/24/2023).   Ortho Exam  Patient is alert, oriented, no adenopathy, well-dressed, normal affect, normal respiratory effort. Examination the patient has a palpable dorsalis pedis pulse the Doppler was used and he has a strong biphasic dorsalis pedis and posterior tibial pulse.  There is no venous ulcers there is no forefoot ulcers.  Patient has a large black eschar that is stable over the left heel this measures 4 x 5 cm.  Imaging: No results found. No images are attached to the encounter.  Labs: Lab Results  Component Value Date   HGBA1C 4.9 11/17/2020   HGBA1C 4.9 % 10/05/2013     Lab Results  Component Value Date   ALBUMIN 3.7 08/27/2021   ALBUMIN 3.7 11/17/2020   ALBUMIN 4.3 11/25/2016    Lab Results  Component Value Date   MG 2.2 12/08/2022   Lab Results  Component Value Date   VD25OH 52.37 12/06/2022    No results found for: "PREALBUMIN"    Latest Ref Rng & Units 12/08/2022    2:14 AM 12/07/2022    1:39 AM 12/06/2022   11:57 AM   CBC EXTENDED  WBC 4.0 - 10.5 K/uL 7.7  8.3  9.5   RBC 4.22 - 5.81 MIL/uL 2.83  3.08  3.32   Hemoglobin 13.0 - 17.0 g/dL 9.0  9.9  19.1   HCT 47.8 - 52.0 % 27.1  30.0  32.8   Platelets 150 - 400 K/uL 185  190  189      There is no height or weight on file to calculate BMI.  Orders:  No orders of the defined types were placed in this encounter.  No orders of the defined types were placed in this encounter.    Procedures: No procedures performed  Clinical Data: No additional findings.  ROS:  All other systems negative, except as noted in the HPI. Review of Systems  Objective: Vital Signs: There were no vitals taken for this visit.  Specialty Comments:  No specialty comments available.  PMFS History: Patient Active Problem List   Diagnosis Date Noted   Closed fracture of left distal femur (HCC) 12/04/2022   Spondylolisthesis, lumbar region 12/22/2021   Lumbar stenosis 10/20/2021   Stenosis of cervical spine with myelopathy (HCC) 05/06/2021   Status post total left knee replacement 07/22/2020   Unilateral primary osteoarthritis, left knee  03/24/2020   Knee pain, chronic 06/15/2016   GERD (gastroesophageal reflux disease)    Morbid obesity (HCC)    Urethritis 03/20/2013   Past Medical History:  Diagnosis Date   Arthritis    left knee   GERD (gastroesophageal reflux disease)    Hypercholesterolemia    Obesity    Urethritis    when he was in [employer]    Family History  Problem Relation Age of Onset   Cancer Mother        breast   Diabetes Father     Past Surgical History:  Procedure Laterality Date   ANTERIOR CERVICAL DECOMP/DISCECTOMY FUSION N/A 05/06/2021   Procedure: Anterior Cervical Discectomy Fusion Cervical Three-Four, Cervical Four-Five;  Surgeon: Bedelia Person, MD;  Location: [facility] OR;  Service: Neurosurgery;  Laterality: N/A;   COLONOSCOPY     ORIF FEMUR FRACTURE Left 12/06/2022   Procedure: OPEN REDUCTION INTERNAL FIXATION (ORIF) DISTAL  FEMUR FRACTURE;  Surgeon: Roby Lofts, MD;  Location: [facility] OR;  Service: Orthopedics;  Laterality: Left;   TOTAL KNEE ARTHROPLASTY Left 07/22/2020   Procedure: LEFT TOTAL KNEE ARTHROPLASTY;  Surgeon: Kathryne Hitch, MD;  Location: [facility] OR;  Service: Orthopedics;  Laterality: Left;   TRANSFORAMINAL LUMBAR INTERBODY FUSION (TLIF) WITH PEDICLE SCREW FIXATION 1 LEVEL N/A 10/20/2021   Procedure: Transforaminal Lumbar Interbody Fusion Lumbar Four-Five;  Surgeon: Bedelia Person, MD;  [facility];  Service: Neurosurgery;  Laterality: N/A;  Transforaminal Lumbar Interbody Fusion Lumbar Four-Five   Social History   Occupational History   Occupation: retired  Tobacco Use   Smoking status: Never   Smokeless tobacco: Never  Vaping Use   Vaping Use: Never used  Substance and Sexual Activity   Alcohol use: No    Alcohol/week: 0.0 standard drinks of alcohol   Drug use: No   Sexual activity: Yes    Birth control/protection: Condom

## 2023-02-07 ENCOUNTER — Ambulatory Visit: Payer: Self-pay | Admitting: Licensed Clinical Social Worker

## 2023-02-07 ENCOUNTER — Telehealth: Payer: Self-pay | Admitting: Orthopedic Surgery

## 2023-02-07 NOTE — Patient Outreach (Signed)
  Care Coordination   Follow Up Visit Note   02/07/2023 Name: Derek [name] MRN: [id number] DOB: 01-17-1946  Derek [name] is a 77 y.o. year old male who sees Dettinger, Elige Radon, MD for primary care. I spoke with  Derek [name] by phone today.  What matters to the patients health and wellness today? Patient is having hip pain issues. Patient has food  shortage issues    Goals Addressed               This Visit's Progress     patient is having hip pain issues. He has food shortage issues. (pt-stated)        Interventions;  Spoke with client about client needs Reviewed program support.  Discussed food needs of client. Spoke with client about [facility]. Gave client the name, address and phone number for [facility] in [place], [place] Spoke with client about his recent medical appointments. He said he saw Dr. Rayburn Ma, orthopedist recently. He talked with Dr. Magnus Ivan about hip pain issues of client Spoke of family support and support of friends. He has friends who help him from time to time Discussed medication procurement Discussed sleeping issues. Discussed ambulation issues. He said he had been in touch with [facility] and was talking with [facility] about obtaining possibly a motorized wheelchair Reviewed program support for client with RN, LCSW and Pharmacist Provided counseling support Encouraged Marisa to call LCSW as needed for SW support at [phone]        SDOH assessments and interventions completed:  Yes  SDOH Interventions Today    Flowsheet Row Most Recent Value  SDOH Interventions   Depression Interventions/Treatment  Counseling  Physical Activity Interventions Other (Comments)  [hip pain issues]  Stress Interventions Provide Counseling  [some food scarcity]        Care Coordination Interventions:  Yes, provided    Interventions Today    Flowsheet Row Most Recent Value  Chronic Disease   Chronic disease during today's  visit Other  [spoke with client about client needs]  General Interventions   General Interventions Discussed/Reviewed General Interventions Discussed, Community Resources  [reviewed program support]  Exercise Interventions   Exercise Discussed/Reviewed Physical Activity  Education Interventions   Education Provided Provided Education  Provided Verbal Education On [employer] with client about Database administrator. Gave client the name, address and phone number for [facility]]  Mental Health Interventions   Mental Health Discussed/Reviewed Anxiety  [client did not mention any mood issues]  Nutrition Interventions   Nutrition Discussed/Reviewed Nutrition Discussed  Pharmacy Interventions   Pharmacy Dicussed/Reviewed Pharmacy Topics Discussed  Safety Interventions   Safety Discussed/Reviewed Fall Risk       Follow up plan: Follow up call scheduled for 04/05/23 at 3:30 PM     Encounter Outcome:  Pt. Visit Completed   [name].Zephyr Sausedo MSW, LCSW Licensed Visual merchandiser [facility] Care Management [phone]

## 2023-02-07 NOTE — Patient Instructions (Signed)
Visit Information  Thank you for taking time to visit with me today. Please don't hesitate to contact me if I can be of assistance to you.   Following are the goals we discussed today:   Goals Addressed               This Visit's Progress     patient is having hip pain issues. He has food shortage issues. (pt-stated)        Interventions;  Spoke with client about client needs Reviewed program support.  Discussed food needs of client. Spoke with client about [employer] [facility] [employer]. Gave client the name, address and phone number for [facility] in [place], [place] Spoke with client about his recent medical appointments. He said he saw Dr. Rayburn Ma, orthopedist recently. He talked with Dr. Magnus Ivan about hip pain issues of client Spoke of family support and support of friends. He has friends who help him from time to time Discussed medication procurement Discussed sleeping issues. Discussed ambulation issues. [facility] and [facility]ized [facility] Reviewed program support for client with RN, LCSW and Pharmacist Provided counseling support Encouraged [name] to call LCSW as needed for SW support at [phone]        Our next appointment is by telephone on 04/05/23 at 3:30 PM   Please call the care guide team at [phone] if you need to cancel or reschedule your appointment.   If you are experiencing a Mental Health or Behavioral Health Crisis or need someone to talk to, please go to [facility] [address] [place] [phone])   The patient verbalized understanding of instructions, educational materials, and care plan provided today and DECLINED offer to receive copy of patient instructions, educational materials, and care plan.   The patient has been provided with contact information for the care management team and has been advised to  call with any health related questions or concerns.   Derek [name].Derek [name] MSW, LCSW Licensed Visual merchandiser [facility] Care Management [phone]

## 2023-02-07 NOTE — Telephone Encounter (Signed)
Received call from [facility] (Nurse) with [facility] needing to know if there are any wound care orders for the patient?  The number to contact Kathie Rhodes is [phone]

## 2023-02-07 NOTE — Telephone Encounter (Signed)
Derek [name] informed pt is using  PRAFO boot, continue with dial soap cleansing and dry dressing's of gauze and ace wrap.

## 2023-02-21 ENCOUNTER — Ambulatory Visit: Payer: Medicare HMO | Admitting: Orthopedic Surgery

## 2023-02-22 ENCOUNTER — Ambulatory Visit: Payer: Medicare HMO | Admitting: Physician Assistant

## 2023-02-22 ENCOUNTER — Encounter: Payer: Self-pay | Admitting: Physician Assistant

## 2023-02-22 DIAGNOSIS — S72492A Other fracture of lower end of left femur, initial encounter for closed fracture: Secondary | ICD-10-CM | POA: Diagnosis not present

## 2023-02-22 NOTE — Progress Notes (Signed)
HPI: [name] comes in today due to left hip pain.  He also states that he is having left knee and femur pain.  Again he underwent open reduction and internal fixation of a left femur periprosthetic fracture on 12/06/2022 by Dr. Jena Gauss.  He has been treated by Dr. Lajoyce Corners for heel ulcer on the left side.  Radiographs of his left hip dated 12/04/2022 showed degenerative changes of the left hip with areas of what appears to be avascular necrosis.  However today his biggest complaint is pain in the lateral aspect of the distal left femur.  He has had no new injuries.  He is trying to ambulate more.  Feels that overall he is getting stronger by doing exercises at home.  Physical exam: General well-developed well-nourished male in no acute distress. Left knee no abnormal warmth erythema he has full extension passively actively lacks about 15 degrees.  Flexion is approximately 100 degrees actively.  Surgical incisions well-healed at the knee and at over the lateral of the femur aspect where he has 2 incisions. Left hip: Good range of motion slight discomfort with internal/external rotation  Impression: Left femur pain Status post left femur fracture ORIF 12/06/2022 Left heel ulcer  Plan: At this point in time do feel that most of his pain still coming from his left femur fracture surgery.  He may have some referred pain from the hip but at this point in time he is not able to undergo surgery for the left hip arthritis/AVN given his heel ulcer.  He will follow-up with Dr. Lajoyce Corners as planned next week for the heel ulcer.  He can follow-up with [place] in the future for the left hip pain.  Questions were encouraged and answered

## 2023-02-23 ENCOUNTER — Telehealth: Payer: Self-pay | Admitting: Family Medicine

## 2023-02-23 DIAGNOSIS — Z0279 Encounter for issue of other medical certificate: Secondary | ICD-10-CM

## 2023-02-23 NOTE — Telephone Encounter (Signed)
Derek [name] dropped off handicap forms to be completed and signed.   Called and spoke to patient to make him aware that we would call when they were ready to be picked up.   Form Fee Paid? (Y/N)       YES     If YES, then form will be placed in the RX/HH Nurse Coordinators box for completion.

## 2023-02-25 NOTE — Telephone Encounter (Signed)
Aware handicap form ready

## 2023-02-28 ENCOUNTER — Ambulatory Visit: Payer: Medicare HMO | Admitting: Orthopedic Surgery

## 2023-03-10 ENCOUNTER — Ambulatory Visit: Payer: Medicare HMO | Admitting: Orthopedic Surgery

## 2023-03-21 ENCOUNTER — Ambulatory Visit: Payer: Medicare HMO | Admitting: Orthopedic Surgery

## 2023-03-21 DIAGNOSIS — L97421 Non-pressure chronic ulcer of left heel and midfoot limited to breakdown of skin: Secondary | ICD-10-CM

## 2023-03-22 ENCOUNTER — Encounter: Payer: Self-pay | Admitting: Orthopedic Surgery

## 2023-03-22 NOTE — Progress Notes (Signed)
Office Visit Note   Patient: Derek [name]           Date of Birth: 1946/06/02           MRN: [id number] Visit Date: 03/21/2023              Requested by: Dettinger, Elige Radon, MD [address] [place],  [place] [address] PCP: Dettinger, Elige Radon, MD  Chief Complaint  Patient presents with   Left Heel - Follow-up      HPI: Patient is a 77 year old gentleman who presents in follow-up for left heel ulcer.  Patient feels like he is making good progress he has been in a PRAFO.  Assessment & Plan: Visit Diagnoses:  1. Non-pressure chronic ulcer of left heel and midfoot limited to breakdown of skin (HCC)     Plan: Recommended elevation with compression socks.  Advance to regular shoewear.  Follow-Up Instructions: No follow-ups on file.   Ortho Exam  Patient is alert, oriented, no adenopathy, well-dressed, normal affect, normal respiratory effort. Examination the left heel ulcer has completely healed there is no open wound no cellulitis no drainage.  Patient does have venous and lymphatic swelling of both lower extremities.  Imaging: No results found. No images are attached to the encounter.  Labs: Lab Results  Component Value Date   HGBA1C 4.9 11/17/2020   HGBA1C 4.9 % 10/05/2013     Lab Results  Component Value Date   ALBUMIN 3.7 08/27/2021   ALBUMIN 3.7 11/17/2020   ALBUMIN 4.3 11/25/2016    Lab Results  Component Value Date   MG 2.2 12/08/2022   Lab Results  Component Value Date   VD25OH 52.37 12/06/2022    No results found for: "PREALBUMIN"    Latest Ref Rng & Units 12/08/2022    2:14 AM 12/07/2022    1:39 AM 12/06/2022   11:57 AM  CBC EXTENDED  WBC 4.0 - 10.5 K/uL 7.7  8.3  9.5   RBC 4.22 - 5.81 MIL/uL 2.83  3.08  3.32   Hemoglobin 13.0 - 17.0 g/dL 9.0  9.9  62.1   HCT 30.8 - 52.0 % 27.1  30.0  32.8   Platelets 150 - 400 K/uL 185  190  189      There is no height or weight on file to calculate BMI.  Orders:  No orders of the defined types were  placed in this encounter.  No orders of the defined types were placed in this encounter.    Procedures: No procedures performed  Clinical Data: No additional findings.  ROS:  All other systems negative, except as noted in the HPI. Review of Systems  Objective: Vital Signs: There were no vitals taken for this visit.  Specialty Comments:  No specialty comments available.  PMFS History: Patient Active Problem List   Diagnosis Date Noted   Closed fracture of left distal femur (HCC) 12/04/2022   Spondylolisthesis, lumbar region 12/22/2021   Lumbar stenosis 10/20/2021   Stenosis of cervical spine with myelopathy (HCC) 05/06/2021   Status post total left knee replacement 07/22/2020   Unilateral primary osteoarthritis, left knee 03/24/2020   Knee pain, chronic 06/15/2016   GERD (gastroesophageal reflux disease)    Morbid obesity (HCC)    Urethritis 03/20/2013   Past Medical History:  Diagnosis Date   Arthritis    left knee   GERD (gastroesophageal reflux disease)    Hypercholesterolemia    Obesity    Urethritis    when he was  in [employer]    Family History  Problem Relation Age of Onset   Cancer Mother        breast   Diabetes Father     Past Surgical History:  Procedure Laterality Date   ANTERIOR CERVICAL DECOMP/DISCECTOMY FUSION N/A 05/06/2021   Procedure: Anterior Cervical Discectomy Fusion Cervical Three-Four, Cervical Four-Five;  Surgeon: Bedelia Person, MD;  Location: [facility] OR;  Service: Neurosurgery;  Laterality: N/A;   COLONOSCOPY     ORIF FEMUR FRACTURE Left 12/06/2022   Procedure: OPEN REDUCTION INTERNAL FIXATION (ORIF) DISTAL FEMUR FRACTURE;  Surgeon: Roby Lofts, MD;  Location: [facility] OR;  Service: Orthopedics;  Laterality: Left;   TOTAL KNEE ARTHROPLASTY Left 07/22/2020   Procedure: LEFT TOTAL KNEE ARTHROPLASTY;  Surgeon: Kathryne Hitch, MD;  Location: [facility] OR;  Service: Orthopedics;  Laterality: Left;   TRANSFORAMINAL LUMBAR INTERBODY FUSION  (TLIF) WITH PEDICLE SCREW FIXATION 1 LEVEL N/A 10/20/2021   Procedure: Transforaminal Lumbar Interbody Fusion Lumbar Four-Five;  Surgeon: Bedelia Person, MD;  [facility];  Service: Neurosurgery;  Laterality: N/A;  Transforaminal Lumbar Interbody Fusion Lumbar Four-Five   Social History   Occupational History   Occupation: retired  Tobacco Use   Smoking status: Never   Smokeless tobacco: Never  Vaping Use   Vaping status: Never Used  Substance and Sexual Activity   Alcohol use: No    Alcohol/week: 0.0 standard drinks of alcohol   Drug use: No   Sexual activity: Yes    Birth control/protection: Condom

## 2023-04-04 ENCOUNTER — Other Ambulatory Visit (HOSPITAL_COMMUNITY): Payer: Self-pay | Admitting: Student

## 2023-04-04 DIAGNOSIS — C9000 Multiple myeloma not having achieved remission: Secondary | ICD-10-CM

## 2023-04-05 ENCOUNTER — Ambulatory Visit: Payer: Self-pay | Admitting: Licensed Clinical Social Worker

## 2023-04-05 NOTE — Patient Outreach (Signed)
  Care Coordination   Follow Up Visit Note   04/05/2023 Name: Derek [name] MRN: [id number] DOB: Jul 20, 1946  Derek [name] is a 77 y.o. year old male who sees Dettinger, Elige Radon, MD for primary care. I spoke with  Derek [name] by phone today.  What matters to the patients health and wellness today?  Patient broke his leg a few weeks ago. He said he is walking slowly and trying to recover from injury to leg. He may have some food needs    Goals Addressed             This Visit's Progress    Patient broke his leg a few weeks ago. He said he is walking slowly and trying to recover from injury to leg. He may have some food needs       Interventions:  LCSW spoke with client via phone today about client  mobility issues and financial issues  Client said he currently uses manuel wheelchair to ambulate. [facility] [place] [place] to discuss possibly getting a motorized wheelchair Discussed program support with client. Informed client of program support with RN, LCSW and Pharmacist Discussed previously with client  food needs of client. Encouraged client to contact [facility] in [place], [place] to talk with agency about possible food support for client. Client also said that several friends reside at his home and that sometimes his friends help in obtaining grocery items for client use. Friends also help sometimes with transport needs of client Client said previously  he is on waiting list for [employer] Whe[employer]. He has been on this waiting list for one year and spoke with representative recently. He hopes he will begin receiving Meals on Wheels in near future Provided counseling support for [facility] Discussed support with PCP, Dr Elige Radon Dettinger Discussed pain issues of client. Discussed sleeping issues of client Discussed medication procurement for client Encouraged client to call LCSW as needed for SW support at [phone]         SDOH assessments and  interventions completed:  Yes  SDOH Interventions Today    Flowsheet Row Most Recent Value  SDOH Interventions   Depression Interventions/Treatment  Counseling  Physical Activity Interventions Other (Comments)  [uses manuel wheelchair to ambulate]  Stress Interventions Provide Counseling  [client has some financial challenges occasionally]        Care Coordination Interventions:  Yes, provided    Interventions Today    Flowsheet Row Most Recent Value  Chronic Disease   Chronic disease during today's visit Other  [spoke with client about client needs]  General Interventions   General Interventions Discussed/Reviewed General Interventions Discussed, Community Resources  Exercise Interventions   Exercise Discussed/Reviewed Physical Activity  [client uses manuel wheelchair to help him ambulate]  Physical Activity Discussed/Reviewed Physical Activity Reviewed  Education Interventions   Education Provided Provided Education  Provided Verbal Education On Community Resources  Mental Health Interventions   Mental Health Discussed/Reviewed Coping Strategies  [discussed mood issues. client said he sometimes gets sad related to difficulty paying his monthly bills]  Nutrition Interventions   Nutrition Discussed/Reviewed Nutrition Discussed  Pharmacy Interventions   Pharmacy Dicussed/Reviewed Pharmacy Topics Discussed       Follow up plan: Follow up call scheduled for 05/25/23 at 10:00 AM    Encounter Outcome:  Pt. Visit Completed   [name].Versie Fleener MSW, LCSW Licensed Visual merchandiser [facility] Care Management [phone]

## 2023-04-05 NOTE — Patient Instructions (Signed)
Visit Information  Thank you for taking time to visit with me today. Please don't hesitate to contact me if I can be of assistance to you.   Following are the goals we discussed today:   Goals Addressed             This Visit's Progress    Patient broke his leg a few weeks ago. He said he is walking slowly and trying to recover from injury to leg. He may have some food needs       Interventions:  LCSW spoke with client via phone today about client  mobility issues and financial issues  Client said he currently uses manuel wheelchair to ambulate. [facility] [place] [place] to discuss possibly getting a motorized wheelchair Discussed program support with client. Informed client of program support with RN, LCSW and Pharmacist Discussed previously with client  food needs of client. Encouraged client to contact [facility] in [place], [place] to talk with agency about possible food support for client. Client also said that several friends reside at his home and that sometimes his friends help in obtaining grocery items for client use. Friends also help sometimes with transport needs of client Client said previously  he is on waiting list for [employer] Wheels. He has been on this waiting list for one year and spoke with representative recently. He hopes he will begin receiving Meals on Wheels in near future Provided counseling support for [facility] Discussed support with PCP, Dr Elige Radon Dettinger Discussed pain issues of client. Discussed sleeping issues of client Discussed medication procurement for client Encouraged client to call LCSW as needed for SW support at [phone]         Our next appointment is by telephone on 05/25/23 at 10:00 AM   Please call the care guide team at [phone] if you need to cancel or reschedule your appointment.   If you are experiencing a Mental Health or Behavioral Health Crisis or need someone to talk to, please go to [facility] [address], [place] [phone])   The patient verbalized understanding of instructions, educational materials, and care plan provided today and DECLINED offer to receive copy of patient instructions, educational materials, and care plan.   The patient has been provided with contact information for the care management team and has been advised to call with any health related questions or concerns.   Derek [name].Derek [name] MSW, LCSW Licensed Visual merchandiser [facility] Care Management [phone]

## 2023-04-07 ENCOUNTER — Encounter (HOSPITAL_COMMUNITY): Payer: Self-pay | Admitting: Student

## 2023-04-07 DIAGNOSIS — C9000 Multiple myeloma not having achieved remission: Secondary | ICD-10-CM

## 2023-04-08 ENCOUNTER — Encounter (HOSPITAL_COMMUNITY): Payer: Self-pay | Admitting: Student

## 2023-04-08 DIAGNOSIS — C9000 Multiple myeloma not having achieved remission: Secondary | ICD-10-CM

## 2023-04-11 ENCOUNTER — Other Ambulatory Visit (HOSPITAL_COMMUNITY): Payer: Self-pay | Admitting: Student

## 2023-04-11 ENCOUNTER — Encounter (HOSPITAL_COMMUNITY): Payer: Self-pay | Admitting: Student

## 2023-04-11 DIAGNOSIS — C9000 Multiple myeloma not having achieved remission: Secondary | ICD-10-CM

## 2023-04-12 ENCOUNTER — Other Ambulatory Visit (HOSPITAL_COMMUNITY): Payer: Self-pay | Admitting: Student

## 2023-04-12 DIAGNOSIS — C9000 Multiple myeloma not having achieved remission: Secondary | ICD-10-CM

## 2023-04-14 ENCOUNTER — Encounter (HOSPITAL_COMMUNITY)
Admission: RE | Admit: 2023-04-14 | Discharge: 2023-04-14 | Disposition: A | Discharge status: Home / Self Care | Payer: No Typology Code available for payment source | Source: Ambulatory Visit | Attending: Student | Admitting: Student

## 2023-04-14 DIAGNOSIS — C9000 Multiple myeloma not having achieved remission: Secondary | ICD-10-CM | POA: Diagnosis present

## 2023-04-14 MED ORDER — FLUDEOXYGLUCOSE F - 18 (FDG) INJECTION
11.7300 | Freq: Once | INTRAVENOUS | Status: AC | PRN
  Administered 2023-04-14: 11.73 via INTRAVENOUS

## 2023-04-19 ENCOUNTER — Other Ambulatory Visit: Payer: Self-pay | Admitting: Physician Assistant

## 2023-04-19 ENCOUNTER — Other Ambulatory Visit: Payer: Self-pay | Admitting: Student

## 2023-04-19 DIAGNOSIS — Z01818 Encounter for other preprocedural examination: Secondary | ICD-10-CM

## 2023-04-20 ENCOUNTER — Ambulatory Visit (HOSPITAL_COMMUNITY)
Admission: RE | Admit: 2023-04-20 | Discharge: 2023-04-20 | Disposition: A | Discharge status: Home / Self Care | Payer: No Typology Code available for payment source | Source: Ambulatory Visit | Attending: Hospitalist | Admitting: Hospitalist

## 2023-04-20 ENCOUNTER — Encounter (HOSPITAL_COMMUNITY): Payer: Self-pay

## 2023-04-20 ENCOUNTER — Ambulatory Visit (HOSPITAL_COMMUNITY): Payer: Medicare HMO

## 2023-04-26 ENCOUNTER — Other Ambulatory Visit: Payer: Self-pay | Admitting: Radiology

## 2023-04-26 NOTE — Consult Note (Incomplete)
Chief Complaint: Patient was seen in consultation today for image guided bone marrow biopsy  Referring Physician(s): Britt,Brian K  Supervising Physician: Roanna Banning  Patient Status: [facility] - Out-pt  History of Present Illness: Catlin Bato is a 77 y.o. male with past medical history of arthritis, cervical radiculopathy, left femur fracture with prior ORIF, GERD, hyperlipidemia, obesity and smoldering multiple myeloma with prior nondiagnostic bone marrow attempt at [facility] and recent labs raising concern for progression to multiple myeloma.  He presents today for image guided bone marrow biopsy for further evaluation.  Past Medical History:  Diagnosis Date   Arthritis    left knee   GERD (gastroesophageal reflux disease)    Hypercholesterolemia    Obesity    Urethritis    when he was in [employer]    Past Surgical History:  Procedure Laterality Date   ANTERIOR CERVICAL DECOMP/DISCECTOMY FUSION N/A 05/06/2021   Procedure: Anterior Cervical Discectomy Fusion Cervical Three-Four, Cervical Four-Five;  Surgeon: Bedelia Person, MD;  Location: [facility] OR;  Service: Neurosurgery;  Laterality: N/A;   COLONOSCOPY     ORIF FEMUR FRACTURE Left 12/06/2022   Procedure: OPEN REDUCTION INTERNAL FIXATION (ORIF) DISTAL FEMUR FRACTURE;  Surgeon: Roby Lofts, MD;  Location: [facility] OR;  Service: Orthopedics;  Laterality: Left;   TOTAL KNEE ARTHROPLASTY Left 07/22/2020   Procedure: LEFT TOTAL KNEE ARTHROPLASTY;  Surgeon: Kathryne Hitch, MD;  Location: [facility] OR;  Service: Orthopedics;  Laterality: Left;   TRANSFORAMINAL LUMBAR INTERBODY FUSION (TLIF) WITH PEDICLE SCREW FIXATION 1 LEVEL N/A 10/20/2021   Procedure: Transforaminal Lumbar Interbody Fusion Lumbar Four-Five;  Surgeon: Bedelia Person, MD;  Location: [facility] OR;  Service: Neurosurgery;  Laterality: N/A;  Transforaminal Lumbar Interbody Fusion Lumbar Four-Five    Allergies: Patient has no known allergies.  Medications: Prior to Admission  medications   Medication Sig Start Date End Date Taking? Authorizing Provider  Butenafine HCl 1 % cream Apply 30 g topically 2 (two) times daily. 02/24/22   Daryll Drown, NP  cetirizine (ZYRTEC) 10 MG tablet Take 10 mg by mouth daily.    [provider]  cholecalciferol (VITAMIN D) 1000 UNITS tablet Take 1,000 Units by mouth daily.    [provider]  Nutritional Supplements (PROSTATE PO) Take 1 tablet by mouth 2 (two) times daily.    [provider]  nystatin cream (MYCOSTATIN) Apply 1 Application topically 2 (two) times daily. 08/05/22   Jannifer Rodney A, FNP  trolamine salicylate (ASPERCREME) 10 % cream Apply 1 application topically as needed for muscle pain.    [provider]     Family History  Problem Relation Age of Onset   Cancer Mother        breast   Diabetes Father     Social History   Socioeconomic History   Marital status: Legally Separated    Spouse name: Not on file   Number of children: 2   Years of education: graduated high school   Highest education level: High school graduate  Occupational History   Occupation: retired  Tobacco Use   Smoking status: Never   Smokeless tobacco: Never  Vaping Use   Vaping status: Never Used  Substance and Sexual Activity   Alcohol use: No    Alcohol/week: 0.0 standard drinks of alcohol   Drug use: No   Sexual activity: Yes    Birth control/protection: Condom  Other Topics Concern   Not on file  Social History Narrative   Lives 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   Right handed   Social Determinants of Health   Financial Resource Strain: Low Risk  (12/24/2022)   Overall Financial Resource Strain (CARDIA)    Difficulty of Paying Living Expenses: Not hard at all  Food Insecurity: No Food Insecurity (12/24/2022)   Hunger Vital Sign    Worried About Running Out of Food in the Last Year: Never true    Ran Out of Food in the Last Year: Never true   Transportation Needs: No Transportation Needs (12/24/2022)   PRAPARE - Administrator, Civil Service (Medical): No    Lack of Transportation (Non-Medical): No  Physical Activity: Inactive (04/05/2023)   Exercise Vital Sign    Days of Exercise per Week: 0 days    Minutes of Exercise per Session: 0 min  Stress: Stress Concern Present (04/05/2023)   [employer] of Occupational Health - Occupational Stress Questionnaire    Feeling of Stress : To some extent  Social Connections: Socially Isolated (12/24/2022)   Social Connection and Isolation Panel [NHANES]    Frequency of Communication with Friends and Family: More than three times a week    Frequency of Social Gatherings with Friends and Family: More than three times a week    Attends Religious Services: Never    Database administrator or Organizations: No    Attends Engineer, structural: Never    Marital Status: Separated     Review of Systems  Vital Signs:   Code Status:   Advance Care Plan: no documents on file    Physical Exam  Imaging: NM PET Image Initial (PI) Whole Body (F-18 FDG)  Result Date: 04/17/2023 CLINICAL DATA:  Initial treatment strategy for multiple myeloma. EXAM: NUCLEAR MEDICINE PET WHOLE BODY TECHNIQUE: 11.7 mCi F-18 FDG was injected intravenously. Full-ring PET imaging was performed from the head to foot after the radiotracer. CT data was obtained and used for attenuation correction and anatomic localization. Fasting blood glucose: 116 mg/dl COMPARISON:  None Available. FINDINGS: Mediastinal blood pool activity: SUV max 2.3 HEAD/NECK: No hypermetabolic activity in the scalp. No hypermetabolic cervical lymph nodes. Incidental CT findings: none CHEST: No hypermetabolic mediastinal or hilar nodes. No suspicious pulmonary nodules on the CT scan. Incidental CT findings: Atherosclerotic calcifications of the aortic arch. Moderate three-vessel coronary atherosclerosis. ABDOMEN/PELVIS: No  abnormal hypermetabolic activity within the liver, pancreas, adrenal glands, or spleen. No hypermetabolic lymph nodes in the abdomen or pelvis. Incidental CT findings: Bilateral renal cysts, including a dominant 6.9 cm anterior right lower pole simple cyst. 2 mm nonobstructing left upper pole renal calculus (series 202/image 188). Atherosclerotic calcifications of the abdominal aorta and branch vessels. SKELETON: 4.0 cm expansile lytic lesion in the right anterior 6th rib (series 202/image 154), max SUV 5.5. Degenerative changes the right shoulder and left hip with associated hypermetabolism. Intramuscular hypermetabolism, for example in the left psoas and medial upper thighs bilaterally, physiologic. Incidental CT findings: none EXTREMITIES: No abnormal hypermetabolic activity in the lower extremities. Incidental CT findings: none IMPRESSION: 4.0 cm expansile lytic lesion in the right anterior 6th rib, consistent with plasmacytoma/multiple myeloma. No additional osseous lesions. Electronically Signed   By: Charline Bills M.D.   On: 04/17/2023 01:18    Labs:  CBC: Recent Labs    12/04/22 1430 12/06/22 1157 12/07/22 0139 12/08/22 0214  WBC 8.9 9.5 8.3 7.7  HGB 11.3* 10.8* 9.9* 9.0*  HCT 34.5* 32.8* 30.0* 27.1*  PLT 198  189 190 185    COAGS: Recent Labs    12/04/22 1516  INR 1.2  APTT 27    BMP: Recent Labs    12/04/22 1430 12/06/22 1157 12/07/22 0139 12/08/22 0214  NA 134*  --  132* 134*  K 4.0  --  3.8 3.7  CL 101  --  100 102  CO2 23  --  23 25  GLUCOSE 106*  --  126* 104*  BUN 16  --  21 20  CALCIUM 9.4  --  9.1 9.1  CREATININE 0.80 0.85 1.02 0.80  GFRNONAA >60 >60 >60 >60    LIVER FUNCTION TESTS: No results for input(s): "BILITOT", "AST", "ALT", "ALKPHOS", "PROT", "ALBUMIN" in the last 8760 hours.  TUMOR MARKERS: No results for input(s): "AFPTM", "CEA", "CA199", "CHROMGRNA" in the last 8760 hours.  Assessment and Plan: 77 y.o. male with past medical history of  arthritis, cervical radiculopathy, left femur fracture with prior ORIF, GERD, hyperlipidemia, obesity and smoldering multiple myeloma with prior nondiagnostic bone marrow attempt at [facility] and recent labs raising concern for progression to multiple myeloma.  He presents today for image guided bone marrow biopsy for further evaluation.Risks and benefits of procedure was discussed with the patient  including, but not limited to bleeding, infection, damage to adjacent structures or low yield requiring additional tests.  All of the questions were answered and there is agreement to proceed.  Consent signed and in chart.    Thank you for this interesting consult.  I greatly enjoyed meeting Larnce Swecker and look forward to participating in their care.  A copy of this report was sent to the requesting provider on this date.  Electronically Signed: D. Jeananne Rama, PA-C 04/26/2023, 11:24 AM   I spent a total of  20 minutes   in face to face in clinical consultation, greater than 50% of which was counseling/coordinating care for image guided bone marrow biopsy

## 2023-04-27 ENCOUNTER — Encounter (HOSPITAL_COMMUNITY): Payer: Self-pay

## 2023-04-27 ENCOUNTER — Ambulatory Visit (HOSPITAL_COMMUNITY)
Admission: RE | Admit: 2023-04-27 | Discharge: 2023-04-27 | Disposition: A | Discharge status: Home / Self Care | Payer: No Typology Code available for payment source | Source: Ambulatory Visit | Attending: Hospitalist | Admitting: Hospitalist

## 2023-04-27 ENCOUNTER — Other Ambulatory Visit: Payer: Self-pay

## 2023-04-27 DIAGNOSIS — Z01818 Encounter for other preprocedural examination: Secondary | ICD-10-CM

## 2023-04-27 DIAGNOSIS — K219 Gastro-esophageal reflux disease without esophagitis: Secondary | ICD-10-CM | POA: Insufficient documentation

## 2023-04-27 DIAGNOSIS — E785 Hyperlipidemia, unspecified: Secondary | ICD-10-CM | POA: Insufficient documentation

## 2023-04-27 DIAGNOSIS — C9000 Multiple myeloma not having achieved remission: Secondary | ICD-10-CM | POA: Insufficient documentation

## 2023-04-27 HISTORY — PX: IR BONE MARROW BIOPSY & ASPIRATION: IMG5727

## 2023-04-27 LAB — CBC WITH DIFFERENTIAL/PLATELET
Abs Immature Granulocytes: 0.02 10*3/uL (ref 0.00–0.07)
Basophils Absolute: 0 10*3/uL (ref 0.0–0.1)
Basophils Relative: 0 %
Eosinophils Absolute: 0.2 10*3/uL (ref 0.0–0.5)
Eosinophils Relative: 3 %
HCT: 28.6 % — ABNORMAL LOW (ref 39.0–52.0)
Hemoglobin: 9.1 g/dL — ABNORMAL LOW (ref 13.0–17.0)
Immature Granulocytes: 0 %
Lymphocytes Relative: 28 %
Lymphs Abs: 1.9 10*3/uL (ref 0.7–4.0)
MCH: 31.8 pg (ref 26.0–34.0)
MCHC: 31.8 g/dL (ref 30.0–36.0)
MCV: 100 fL (ref 80.0–100.0)
Monocytes Absolute: 0.4 10*3/uL (ref 0.1–1.0)
Monocytes Relative: 6 %
Neutro Abs: 4.2 10*3/uL (ref 1.7–7.7)
Neutrophils Relative %: 63 %
Platelets: 202 10*3/uL (ref 150–400)
RBC: 2.86 MIL/uL — ABNORMAL LOW (ref 4.22–5.81)
RDW: 15.2 % (ref 11.5–15.5)
WBC: 6.8 10*3/uL (ref 4.0–10.5)
nRBC: 0 % (ref 0.0–0.2)

## 2023-04-27 MED ORDER — FENTANYL CITRATE (PF) 100 MCG/2ML IJ SOLN
INTRAMUSCULAR | Status: AC
  Filled 2023-04-27: qty 2

## 2023-04-27 MED ORDER — LIDOCAINE HCL (PF) 1 % IJ SOLN: 30.0000 mL | Freq: Once | INTRAMUSCULAR | Status: DC

## 2023-04-27 MED ORDER — ONDANSETRON HCL 4 MG/2ML IJ SOLN
INTRAMUSCULAR | Status: AC | PRN
Start: 2023-04-27 — End: 2023-04-27
  Administered 2023-04-27: 4 mg via INTRAVENOUS

## 2023-04-27 MED ORDER — MIDAZOLAM HCL 2 MG/2ML IJ SOLN
INTRAMUSCULAR | Status: AC
  Filled 2023-04-27: qty 2

## 2023-04-27 MED ORDER — ONDANSETRON HCL 4 MG/2ML IJ SOLN
INTRAMUSCULAR | Status: AC
  Filled 2023-04-27: qty 2

## 2023-04-27 MED ORDER — FENTANYL CITRATE (PF) 100 MCG/2ML IJ SOLN
INTRAMUSCULAR | Status: AC | PRN
Start: 2023-04-27 — End: 2023-04-27
  Administered 2023-04-27: 25 ug via INTRAVENOUS

## 2023-04-27 MED ORDER — MIDAZOLAM HCL 2 MG/2ML IJ SOLN
INTRAMUSCULAR | Status: AC | PRN
Start: 2023-04-27 — End: 2023-04-27
  Administered 2023-04-27: 1 mg via INTRAVENOUS

## 2023-04-27 MED ORDER — LIDOCAINE HCL (PF) 1 % IJ SOLN
INTRAMUSCULAR | Status: AC
  Filled 2023-04-27: qty 30

## 2023-04-27 MED ORDER — SODIUM CHLORIDE 0.9 % IV SOLN: INTRAVENOUS | Status: DC

## 2023-04-27 MED ORDER — FENTANYL CITRATE (PF) 100 MCG/2ML IJ SOLN
INTRAMUSCULAR | Status: AC | PRN
Start: 2023-04-27 — End: 2023-04-27
  Administered 2023-04-27: 50 ug via INTRAVENOUS

## 2023-04-27 NOTE — Procedures (Signed)
Vascular and Interventional Radiology Procedure Note  Patient: Derek [name] DOB: 1946/04/10 Medical Record Number: [id number] Note Date/Time: 04/27/23 9:53 AM   Performing Physician: Roanna Banning, MD Assistant(s): None  Diagnosis: MM  Procedure: BONE MARROW ASPIRATION and BIOPSY  Anesthesia: Conscious Sedation Complications: None Estimated Blood Loss: Minimal Specimens: Sent for Pathology  Findings:  Successful Fluoroscopy-guided bone marrow aspiration and biopsy A total of 1 cores were obtained. Hemostasis of the tract was achieved using Manual Pressure.  Plan: Bed rest for 1 hours.  See detailed procedure note with images in PACS. The patient tolerated the procedure well without incident or complication and was returned to Recovery in stable condition.    Roanna Banning, MD Vascular and Interventional Radiology Specialists [facility] Radiology   Pager. [phone] Clinic. [phone]

## 2023-04-27 NOTE — Discharge Instructions (Signed)
Discharge Instructions:   Please call Interventional Radiology clinic [phone] with any questions or concerns.  You may remove your dressing and shower tomorrow.    Bone Marrow Aspiration and Bone Marrow Biopsy, Adult, Care After This sheet gives you information about how to care for yourself after your procedure. Your health care provider may also give you more specific instructions. If you have problems or questions, contact your health care provider. What can I expect after the procedure? After the procedure, it is common to have: Mild pain and tenderness. Swelling. Bruising. Follow these instructions at home: Puncture site care  Follow instructions from your health care provider about how to take care of the puncture site. Make sure you: Wash your hands with soap and water before and after you change your bandage (dressing). If soap and water are not available, use hand sanitizer. Change your dressing as told by your health care provider. Check your puncture site every day for signs of infection. Check for: More redness, swelling, or pain. Fluid or blood. Warmth. Pus or a bad smell. Activity Return to your normal activities as told by your health care provider. Ask your health care provider what activities are safe for you. Do not lift anything that is heavier than 10 lb (4.5 kg), or the limit that you are told, until your health care provider says that it is safe. Do not drive for 24 hours if you were given a sedative during your procedure. General instructions  Take over-the-counter and prescription medicines only as told by your health care provider. Do not take baths, swim, or use a hot tub until your health care provider approves. Ask your health care provider if you may take showers. You may only be allowed to take sponge baths. If directed, put ice on the affected area. To do this: Put ice in a plastic bag. Place a towel between your skin and the bag. Leave the ice  on for 20 minutes, 2-3 times a day. Keep all follow-up visits as told by your health care provider. This is important. Contact a health care provider if: Your pain is not controlled with medicine. You have a fever. You have more redness, swelling, or pain around the puncture site. You have fluid or blood coming from the puncture site. Your puncture site feels warm to the touch. You have pus or a bad smell coming from the puncture site. Summary After the procedure, it is common to have mild pain, tenderness, swelling, and bruising. Follow instructions from your health care provider about how to take care of the puncture site and what activities are safe for you. Take over-the-counter and prescription medicines only as told by your health care provider. Contact a health care provider if you have any signs of infection, such as fluid or blood coming from the puncture site. This information is not intended to replace advice given to you by your health care provider. Make sure you discuss any questions you have with your health care provider. Document Revised: 12/19/2018 Document Reviewed: 12/19/2018 Elsevier Patient Education  2023 Elsevier Inc.   Moderate Conscious Sedation, Adult, Care After This sheet gives you information about how to care for yourself after your procedure. Your health care provider may also give you more specific instructions. If you have problems or questions, contact your health care provider. What can I expect after the procedure? After the procedure, it is common to have: Sleepiness for several hours. Impaired judgment for several hours. Difficulty with balance. Vomiting if   you eat too soon. Follow these instructions at home: For the time period you were told by your health care provider: Rest. Do not participate in activities where you could fall or become injured. Do not drive or use machinery. Do not drink alcohol. Do not take sleeping pills or medicines that  cause drowsiness. Do not make important decisions or sign legal documents. Do not take care of children on your own. Eating and drinking  Follow the diet recommended by your health care provider. Drink enough fluid to keep your urine pale yellow. If you vomit: Drink water, juice, or soup when you can drink without vomiting. Make sure you have little or no nausea before eating solid foods. General instructions Take over-the-counter and prescription medicines only as told by your health care provider. Have a responsible adult stay with you for the time you are told. It is important to have someone help care for you until you are awake and alert. Do not smoke. Keep all follow-up visits as told by your health care provider. This is important. Contact a health care provider if: You are still sleepy or having trouble with balance after 24 hours. You feel light-headed. You keep feeling nauseous or you keep vomiting. You develop a rash. You have a fever. You have redness or swelling around the IV site. Get help right away if: You have trouble breathing. You have new-onset confusion at home. Summary After the procedure, it is common to feel sleepy, have impaired judgment, or feel nauseous if you eat too soon. Rest after you get home. Know the things you should not do after the procedure. Follow the diet recommended by your health care provider and drink enough fluid to keep your urine pale yellow. Get help right away if you have trouble breathing or new-onset confusion at home. This information is not intended to replace advice given to you by your health care provider. Make sure you discuss any questions you have with your health care provider. Document Revised: 11/30/2019 Document Reviewed: 06/28/2019 Elsevier Patient Education  2023 Elsevier Inc.  

## 2023-04-28 ENCOUNTER — Ambulatory Visit (HOSPITAL_COMMUNITY): Payer: Medicare HMO

## 2023-05-02 LAB — SURGICAL PATHOLOGY

## 2023-05-08 NOTE — Progress Notes (Deleted)
VASCULAR AND VEIN SPECIALISTS OF [place]  ASSESSMENT / PLAN: Derek [name] is a 77 y.o. male with chronic venous insufficiency of *** lower extremity causing *** (C*** disease).  Venous duplex is significant for *** Recommend compression and elevation for symptomatic relief. ***Follow up with me in three months to discuss saphenous vein ablation. ***Follow up with me as needed.   CHIEF COMPLAINT: ***  HISTORY OF PRESENT ILLNESS: Derek [name] is a 77 y.o. male ***  VASCULAR SURGICAL HISTORY: ***  VASCULAR RISK FACTORS: {FINDINGS; POSITIVE [phone]} history of stroke / transient ischemic attack. {FINDINGS; POSITIVE [phone]} history of coronary artery disease. *** history of PCI. *** history of CABG.  {FINDINGS; POSITIVE [phone]} history of diabetes mellitus. Last A1c ***. {FINDINGS; POSITIVE [phone]} history of smoking. *** actively smoking. {FINDINGS; POSITIVE [phone]} history of hypertension. *** drug regimen with *** control. {FINDINGS; POSITIVE [phone]} history of chronic kidney disease.  Last GFR ***. CKD {stage:30421363}. {FINDINGS; POSITIVE [phone]} history of chronic obstructive pulmonary disease, treated with ***.  FUNCTIONAL STATUS: ECOG performance status: {findings; ecog performance status:31780} Ambulatory status: {TNHAmbulation:25868}  CAREY 1 AND 3 YEAR INDEX Male (2pts) 75-79 or 80-84 (2pts) >84 (3pts) Dependence in toileting (1pt) Partial or full dependence in dressing (1pt) History of malignant neoplasm (2pts) CHF (3pts) COPD (1pts) CKD (3pts)  0-3 pts 6% 1 year mortality ; 21% 3 year mortality 4-5 pts 12% 1 year mortality ; 36% 3 year mortality >5 pts 21% 1 year mortality; 54% 3 year mortality   Past Medical History:  Diagnosis Date   Arthritis    left knee   GERD (gastroesophageal reflux disease)    Hypercholesterolemia    Obesity    Urethritis    when he  was in [employer]    Past Surgical History:  Procedure Laterality Date   ANTERIOR CERVICAL DECOMP/DISCECTOMY FUSION N/A 05/06/2021   Procedure: Anterior Cervical Discectomy Fusion Cervical Three-Four, Cervical Four-Five;  Surgeon: Bedelia Person, MD;  Location: [facility] OR;  Service: Neurosurgery;  Laterality: N/A;   COLONOSCOPY     IR BONE MARROW BIOPSY & ASPIRATION  04/27/2023   ORIF FEMUR FRACTURE Left 12/06/2022   Procedure: OPEN REDUCTION INTERNAL FIXATION (ORIF) DISTAL FEMUR FRACTURE;  Surgeon: Roby Lofts, MD;  Location: [facility] OR;  Service: Orthopedics;  Laterality: Left;   TOTAL KNEE ARTHROPLASTY Left 07/22/2020   Procedure: LEFT TOTAL KNEE ARTHROPLASTY;  Surgeon: Kathryne Hitch, MD;  Location: [facility] OR;  Service: Orthopedics;  Laterality: Left;   TRANSFORAMINAL LUMBAR INTERBODY FUSION (TLIF) WITH PEDICLE SCREW FIXATION 1 LEVEL N/A 10/20/2021   Procedure: Transforaminal Lumbar Interbody Fusion Lumbar Four-Five;  Surgeon: Bedelia Person, MD;  Location: [facility] OR;  Service: Neurosurgery;  Laterality: N/A;  Transforaminal Lumbar Interbody Fusion Lumbar Four-Five    Family History  Problem Relation Age of Onset   Cancer Mother        breast   Diabetes Father     Social History   Socioeconomic History   Marital status: Legally Separated    Spouse name: Not on file   Number of children: 2   Years of education: graduated high school   Highest education level: High school graduate  Occupational History   Occupation: retired  Tobacco Use   Smoking status: Never   Smokeless tobacco: Never  Vaping Use   Vaping status: Never Used  Substance and Sexual Activity   Alcohol use: No    Alcohol/week: 0.0 standard drinks of alcohol   Drug use: No   Sexual activity: Yes  Birth control/protection: Condom  Other Topics Concern   Not on file  Social History Narrative   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   Right handed   Social  Determinants of Health   Financial Resource Strain: Low Risk  (12/24/2022)   Overall Financial Resource Strain (CARDIA)    Difficulty of Paying Living Expenses: Not hard at all  Food Insecurity: No Food Insecurity (12/24/2022)   Hunger Vital Sign    Worried About Running Out of Food in the Last Year: Never true    Ran Out of Food in the Last Year: Never true  Transportation Needs: No Transportation Needs (12/24/2022)   PRAPARE - Administrator, Civil Service (Medical): No    Lack of Transportation (Non-Medical): No  Physical Activity: Inactive (04/05/2023)   Exercise Vital Sign    Days of Exercise per Week: 0 days    Minutes of Exercise per Session: 0 min  Stress: Stress Concern Present (04/05/2023)   [employer] of Occupational Health - Occupational Stress Questionnaire    Feeling of Stress : To some extent  Social Connections: Socially Isolated (12/24/2022)   Social Connection and Isolation Panel [NHANES]    Frequency of Communication with Friends and Family: More than three times a week    Frequency of Social Gatherings with Friends and Family: More than three times a week    Attends Religious Services: Never    Database administrator or Organizations: No    Attends Banker Meetings: Never    Marital Status: Separated  Intimate Partner Violence: Not At Risk (12/24/2022)   Humiliation, Afraid, Rape, and Kick questionnaire    Fear of Current or Ex-Partner: No    Emotionally Abused: No    Physically Abused: No    Sexually Abused: No    No Known Allergies  Current Outpatient Medications  Medication Sig Dispense Refill   Butenafine HCl 1 % cream Apply 30 g topically 2 (two) times daily. 24 g 0   cetirizine (ZYRTEC) 10 MG tablet Take 10 mg by mouth daily.     cholecalciferol (VITAMIN D) 1000 UNITS tablet Take 1,000 Units by mouth daily.     Nutritional Supplements (PROSTATE PO) Take 1 tablet by mouth 2 (two) times daily.     nystatin cream (MYCOSTATIN)  Apply 1 Application topically 2 (two) times daily. 60 g 1   trolamine salicylate (ASPERCREME) 10 % cream Apply 1 application topically as needed for muscle pain.     No current facility-administered medications for this visit.    PHYSICAL EXAM There were no vitals filed for this visit.  Constitutional: *** appearing. *** distress. Appears *** nourished.  Neurologic: CN ***. *** focal findings. *** sensory loss. Psychiatric: *** Mood and affect symmetric and appropriate. Eyes: *** No icterus. No conjunctival pallor. Ears, nose, throat: *** mucous membranes moist. Midline trachea.  Cardiac: *** rate and rhythm.  Respiratory: *** unlabored. Abdominal: *** soft, non-tender, non-distended.  Peripheral vascular: *** Extremity: *** edema. *** cyanosis. *** pallor.  Skin: *** gangrene. *** ulceration.  Lymphatic: *** Stemmer's sign. *** palpable lymphadenopathy.    PERTINENT LABORATORY AND RADIOLOGIC DATA  Most recent CBC    Latest Ref Rng & Units 04/27/2023    9:10 AM 12/08/2022    2:14 AM 12/07/2022    1:39 AM  CBC  WBC 4.0 - 10.5 K/uL 6.8  7.7  8.3   Hemoglobin 13.0 - 17.0 g/dL 9.1  9.0  9.9   Hematocrit 39.0 - 52.0 % 28.6  27.1  30.0   Platelets 150 - 400 K/uL 202  185  190      Most recent CMP    Latest Ref Rng & Units 12/08/2022    2:14 AM 12/07/2022    1:39 AM 12/06/2022   11:57 AM  CMP  Glucose 70 - 99 mg/dL 161  096    BUN 8 - 23 mg/dL 20  21    Creatinine 0.45 - 1.24 mg/dL 4.09  8.11  9.14   Sodium 135 - 145 mmol/L 134  132    Potassium 3.5 - 5.1 mmol/L 3.7  3.8    Chloride 98 - 111 mmol/L 102  100    CO2 22 - 32 mmol/L 25  23    Calcium 8.9 - 10.3 mg/dL 9.1  9.1      Renal function CrCl cannot be calculated (Patient's most recent lab result is older than the maximum 21 days allowed.).  HB A1C (BAYER DCA - WAIVED) (%)  Date Value  11/17/2020 4.9    LDL Calculated  Date Value Ref Range Status  06/15/2016 88 0 - 99 mg/dL Final     Vascular  Imaging: ***  Casimer Russett N. Lenell Antu, MD [facility] Vascular and Vein Specialists of [facility] Phone Number: [phone] 05/08/2023 4:05 PM   Total time spent on preparing this encounter including chart review, data review, collecting history, examining the patient, coordinating care for this {tnhtimebilling:26202}  Portions of this report may have been transcribed using voice recognition software.  Every effort has been made to ensure accuracy; however, inadvertent computerized transcription errors may still be present.

## 2023-05-09 ENCOUNTER — Encounter: Payer: No Typology Code available for payment source | Admitting: Vascular Surgery

## 2023-05-13 ENCOUNTER — Encounter (HOSPITAL_COMMUNITY): Payer: Self-pay

## 2023-05-17 ENCOUNTER — Ambulatory Visit: Payer: Self-pay | Admitting: Licensed Clinical Social Worker

## 2023-05-17 NOTE — Patient Outreach (Signed)
Care Coordination   05/17/2023 Name: Derek [name] MRN: [id number] DOB: Sep 24, 1945   Care Coordination Outreach Attempts:  An unsuccessful telephone outreach was attempted today to offer the patient information about available care coordination services.  Follow Up Plan:  Additional outreach attempts will be made to offer the patient care coordination information and services.   Encounter Outcome:  No Answer   Care Coordination Interventions:  No, not indicated    [name].Babacar Haycraft MSW, LCSW Licensed Visual merchandiser [facility] Care Management [phone]

## 2023-05-23 ENCOUNTER — Other Ambulatory Visit: Payer: Self-pay | Admitting: Family

## 2023-05-23 DIAGNOSIS — B372 Candidiasis of skin and nail: Secondary | ICD-10-CM

## 2023-05-25 ENCOUNTER — Encounter: Payer: Medicare HMO | Admitting: Licensed Clinical Social Worker

## 2023-06-08 ENCOUNTER — Encounter: Payer: Medicare HMO | Admitting: Licensed Clinical Social Worker

## 2023-06-16 ENCOUNTER — Encounter (HOSPITAL_COMMUNITY): Payer: Self-pay

## 2023-06-20 ENCOUNTER — Other Ambulatory Visit: Payer: Self-pay | Admitting: *Deleted

## 2023-06-20 DIAGNOSIS — M7989 Other specified soft tissue disorders: Secondary | ICD-10-CM

## 2023-06-27 NOTE — Progress Notes (Deleted)
[facility] OF [place]  ASSESSMENT / PLAN: Derek [name] is a 77 y.o. male with chronic venous insufficiency of *** lower extremity causing *** (C*** disease).  Venous duplex is significant for *** Recommend compression and elevation for symptomatic relief. ***Follow up with me in three months to discuss saphenous vein ablation. ***Follow up with me as needed.   CHIEF COMPLAINT: ***  HISTORY OF PRESENT ILLNESS: Derek [name] is a 77 y.o. male ***  VASCULAR SURGICAL HISTORY: ***  VASCULAR RISK FACTORS: {FINDINGS; POSITIVE [phone]} history of stroke / transient ischemic attack. {FINDINGS; POSITIVE [phone]} history of coronary artery disease. *** history of PCI. *** history of CABG.  {FINDINGS; POSITIVE [phone]} history of diabetes mellitus. Last A1c ***. {FINDINGS; POSITIVE [phone]} history of smoking. *** actively smoking. {FINDINGS; POSITIVE [phone]} history of hypertension. *** drug regimen with *** control. {FINDINGS; POSITIVE [phone]} history of chronic kidney disease.  Last GFR ***. CKD {stage:30421363}. {FINDINGS; POSITIVE [phone]} history of chronic obstructive pulmonary disease, treated with ***.  FUNCTIONAL STATUS: ECOG performance status: {findings; ecog performance status:31780} Ambulatory status: {TNHAmbulation:25868}  CAREY 1 AND 3 YEAR INDEX Male (2pts) 75-79 or 80-84 (2pts) >84 (3pts) Dependence in toileting (1pt) Partial or full dependence in dressing (1pt) History of malignant neoplasm (2pts) CHF (3pts) COPD (1pts) CKD (3pts)  0-3 pts 6% 1 year mortality ; 21% 3 year mortality 4-5 pts 12% 1 year mortality ; 36% 3 year mortality >5 pts 21% 1 year mortality; 54% 3 year mortality   Past Medical History:  Diagnosis Date   Arthritis    left knee   GERD (gastroesophageal reflux disease)    Hypercholesterolemia    Obesity    Urethritis    when he  was in [employer]    Past Surgical History:  Procedure Laterality Date   ANTERIOR CERVICAL DECOMP/DISCECTOMY FUSION N/A 05/06/2021   Procedure: Anterior Cervical Discectomy Fusion Cervical Three-Four, Cervical Four-Five;  Surgeon: Bedelia Person, MD;  Location: [facility] OR;  Service: Neurosurgery;  Laterality: N/A;   COLONOSCOPY     IR BONE MARROW BIOPSY & ASPIRATION  04/27/2023   ORIF FEMUR FRACTURE Left 12/06/2022   Procedure: OPEN REDUCTION INTERNAL FIXATION (ORIF) DISTAL FEMUR FRACTURE;  Surgeon: Roby Lofts, MD;  Location: [facility] OR;  Service: Orthopedics;  Laterality: Left;   TOTAL KNEE ARTHROPLASTY Left 07/22/2020   Procedure: LEFT TOTAL KNEE ARTHROPLASTY;  Surgeon: Kathryne Hitch, MD;  Location: [facility] OR;  Service: Orthopedics;  Laterality: Left;   TRANSFORAMINAL LUMBAR INTERBODY FUSION (TLIF) WITH PEDICLE SCREW FIXATION 1 LEVEL N/A 10/20/2021   Procedure: Transforaminal Lumbar Interbody Fusion Lumbar Four-Five;  Surgeon: Bedelia Person, MD;  Location: [facility] OR;  Service: Neurosurgery;  Laterality: N/A;  Transforaminal Lumbar Interbody Fusion Lumbar Four-Five    Family History  Problem Relation Age of Onset   Cancer Mother        breast   Diabetes Father     Social History   Socioeconomic History   Marital status: Legally Separated    Spouse name: Not on file   Number of children: 2   Years of education: graduated high school   Highest education level: High school graduate  Occupational History   Occupation: retired  Tobacco Use   Smoking status: Never   Smokeless tobacco: Never  Vaping Use   Vaping status: Never Used  Substance and Sexual Activity   Alcohol use: No    Alcohol/week: 0.0 standard drinks of alcohol   Drug use: No   Sexual activity: Yes  Birth control/protection: Condom  Other Topics Concern   Not on file  Social History Narrative   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   Right handed   Social  Determinants of Health   Financial Resource Strain: Low Risk  (12/24/2022)   Overall Financial Resource Strain (CARDIA)    Difficulty of Paying Living Expenses: Not hard at all  Food Insecurity: No Food Insecurity (12/24/2022)   Hunger Vital Sign    Worried About Running Out of Food in the Last Year: Never true    Ran Out of Food in the Last Year: Never true  Transportation Needs: No Transportation Needs (12/24/2022)   PRAPARE - Administrator, Civil Service (Medical): No    Lack of Transportation (Non-Medical): No  Physical Activity: Inactive (04/05/2023)   Exercise Vital Sign    Days of Exercise per Week: 0 days    Minutes of Exercise per Session: 0 min  Stress: Stress Concern Present (04/05/2023)   [employer] of Occupational Health - Occupational Stress Questionnaire    Feeling of Stress : To some extent  Social Connections: Socially Isolated (12/24/2022)   Social Connection and Isolation Panel [NHANES]    Frequency of Communication with Friends and Family: More than three times a week    Frequency of Social Gatherings with Friends and Family: More than three times a week    Attends Religious Services: Never    Database administrator or Organizations: No    Attends Banker Meetings: Never    Marital Status: Separated  Intimate Partner Violence: Not At Risk (12/24/2022)   Humiliation, Afraid, Rape, and Kick questionnaire    Fear of Current or Ex-Partner: No    Emotionally Abused: No    Physically Abused: No    Sexually Abused: No    No Known Allergies  Current Outpatient Medications  Medication Sig Dispense Refill   Butenafine HCl 1 % cream Apply 30 g topically 2 (two) times daily. 24 g 0   cetirizine (ZYRTEC) 10 MG tablet Take 10 mg by mouth daily.     cholecalciferol (VITAMIN D) 1000 UNITS tablet Take 1,000 Units by mouth daily.     Nutritional Supplements (PROSTATE PO) Take 1 tablet by mouth 2 (two) times daily.     nystatin cream (MYCOSTATIN)  Apply 1 Application topically 2 (two) times daily. 60 g 1   trolamine salicylate (ASPERCREME) 10 % cream Apply 1 application topically as needed for muscle pain.     No current facility-administered medications for this visit.    PHYSICAL EXAM There were no vitals filed for this visit.  Constitutional: *** appearing. *** distress. Appears *** nourished.  Neurologic: CN ***. *** focal findings. *** sensory loss. Psychiatric: *** Mood and affect symmetric and appropriate. Eyes: *** No icterus. No conjunctival pallor. Ears, nose, throat: *** mucous membranes moist. Midline trachea.  Cardiac: *** rate and rhythm.  Respiratory: *** unlabored. Abdominal: *** soft, non-tender, non-distended.  Peripheral vascular: *** Extremity: *** edema. *** cyanosis. *** pallor.  Skin: *** gangrene. *** ulceration.  Lymphatic: *** Stemmer's sign. *** palpable lymphadenopathy.    PERTINENT LABORATORY AND RADIOLOGIC DATA  Most recent CBC    Latest Ref Rng & Units 04/27/2023    9:10 AM 12/08/2022    2:14 AM 12/07/2022    1:39 AM  CBC  WBC 4.0 - 10.5 K/uL 6.8  7.7  8.3   Hemoglobin 13.0 - 17.0 g/dL 9.1  9.0  9.9   Hematocrit 39.0 - 52.0 % 28.6  27.1  30.0   Platelets 150 - 400 K/uL 202  185  190      Most recent CMP    Latest Ref Rng & Units 12/08/2022    2:14 AM 12/07/2022    1:39 AM 12/06/2022   11:57 AM  CMP  Glucose 70 - 99 mg/dL 161  096    BUN 8 - 23 mg/dL 20  21    Creatinine 0.45 - 1.24 mg/dL 4.09  8.11  9.14   Sodium 135 - 145 mmol/L 134  132    Potassium 3.5 - 5.1 mmol/L 3.7  3.8    Chloride 98 - 111 mmol/L 102  100    CO2 22 - 32 mmol/L 25  23    Calcium 8.9 - 10.3 mg/dL 9.1  9.1      Renal function CrCl cannot be calculated (Patient's most recent lab result is older than the maximum 21 days allowed.).  HB A1C (BAYER DCA - WAIVED) (%)  Date Value  11/17/2020 4.9    LDL Calculated  Date Value Ref Range Status  06/15/2016 88 0 - 99 mg/dL Final     Vascular  Imaging: ***  Ailey Wessling N. Lenell Antu, MD FACS [facility] Phone Number: [phone] 06/27/2023 9:44 AM   Total time spent on preparing this encounter including chart review, data review, collecting history, examining the patient, coordinating care for this {tnhtimebilling:26202}  Portions of this report may have been transcribed using voice recognition software.  Every effort has been made to ensure accuracy; however, inadvertent computerized transcription errors may still be present.

## 2023-06-28 ENCOUNTER — Ambulatory Visit (HOSPITAL_COMMUNITY): Payer: No Typology Code available for payment source

## 2023-06-28 ENCOUNTER — Encounter: Payer: Medicare HMO | Admitting: Vascular Surgery

## 2023-07-01 ENCOUNTER — Ambulatory Visit (HOSPITAL_COMMUNITY): Payer: Medicare HMO

## 2023-07-01 ENCOUNTER — Encounter: Payer: Medicare HMO | Admitting: Vascular Surgery

## 2023-07-29 ENCOUNTER — Observation Stay (HOSPITAL_COMMUNITY)
Admission: EM | Admit: 2023-07-29 | Discharge: 2023-07-30 | Disposition: A | Discharge status: Home / Self Care | Payer: No Typology Code available for payment source | Attending: Emergency Medicine | Admitting: Emergency Medicine

## 2023-07-29 ENCOUNTER — Emergency Department (HOSPITAL_COMMUNITY): Payer: No Typology Code available for payment source

## 2023-07-29 ENCOUNTER — Observation Stay (HOSPITAL_BASED_OUTPATIENT_CLINIC_OR_DEPARTMENT_OTHER): Payer: No Typology Code available for payment source

## 2023-07-29 ENCOUNTER — Encounter (HOSPITAL_COMMUNITY): Payer: Self-pay

## 2023-07-29 ENCOUNTER — Other Ambulatory Visit: Payer: Self-pay

## 2023-07-29 DIAGNOSIS — R531 Weakness: Secondary | ICD-10-CM | POA: Diagnosis present

## 2023-07-29 DIAGNOSIS — D638 Anemia in other chronic diseases classified elsewhere: Secondary | ICD-10-CM | POA: Diagnosis not present

## 2023-07-29 DIAGNOSIS — Z96652 Presence of left artificial knee joint: Secondary | ICD-10-CM | POA: Diagnosis not present

## 2023-07-29 DIAGNOSIS — G459 Transient cerebral ischemic attack, unspecified: Secondary | ICD-10-CM

## 2023-07-29 DIAGNOSIS — C9000 Multiple myeloma not having achieved remission: Secondary | ICD-10-CM | POA: Diagnosis not present

## 2023-07-29 DIAGNOSIS — Z79899 Other long term (current) drug therapy: Secondary | ICD-10-CM | POA: Diagnosis not present

## 2023-07-29 DIAGNOSIS — R739 Hyperglycemia, unspecified: Secondary | ICD-10-CM

## 2023-07-29 DIAGNOSIS — R200 Anesthesia of skin: Secondary | ICD-10-CM | POA: Diagnosis not present

## 2023-07-29 DIAGNOSIS — E871 Hypo-osmolality and hyponatremia: Secondary | ICD-10-CM | POA: Diagnosis not present

## 2023-07-29 DIAGNOSIS — E876 Hypokalemia: Secondary | ICD-10-CM | POA: Diagnosis not present

## 2023-07-29 DIAGNOSIS — D649 Anemia, unspecified: Secondary | ICD-10-CM

## 2023-07-29 HISTORY — DX: Malignant (primary) neoplasm, unspecified: C80.1

## 2023-07-29 LAB — COMPREHENSIVE METABOLIC PANEL
ALT: 22 U/L (ref 0–44)
AST: 13 U/L — ABNORMAL LOW (ref 15–41)
Albumin: 3.2 g/dL — ABNORMAL LOW (ref 3.5–5.0)
Alkaline Phosphatase: 60 U/L (ref 38–126)
Anion gap: 9 (ref 5–15)
BUN: 10 mg/dL (ref 8–23)
CO2: 25 mmol/L (ref 22–32)
Calcium: 8.8 mg/dL — ABNORMAL LOW (ref 8.9–10.3)
Chloride: 99 mmol/L (ref 98–111)
Creatinine, Ser: 0.69 mg/dL (ref 0.61–1.24)
GFR, Estimated: >60 mL/min (ref >60)
Glucose, Bld: 113 mg/dL — ABNORMAL HIGH (ref 70–99)
Potassium: 3.4 mmol/L — ABNORMAL LOW (ref 3.5–5.1)
Sodium: 133 mmol/L — ABNORMAL LOW (ref 135–145)
Total Bilirubin: 0.6 mg/dL (ref <1.2)
Total Protein: 5.9 g/dL — ABNORMAL LOW (ref 6.5–8.1)

## 2023-07-29 LAB — RAPID URINE DRUG SCREEN, HOSP PERFORMED
Amphetamines: NOT DETECTED
Barbiturates: NOT DETECTED
Benzodiazepines: NOT DETECTED
Cocaine: NOT DETECTED
Opiates: NOT DETECTED
Tetrahydrocannabinol: NOT DETECTED

## 2023-07-29 LAB — LIPID PANEL
Cholesterol: 149 mg/dL (ref 0–200)
HDL: 51 mg/dL (ref >40)
LDL Cholesterol: 84 mg/dL (ref 0–99)
Total CHOL/HDL Ratio: 2.9 {ratio}
Triglycerides: 71 mg/dL (ref <150)
VLDL: 14 mg/dL (ref 0–40)

## 2023-07-29 LAB — HEMOGLOBIN A1C
Hgb A1c MFr Bld: 4.8 % (ref 4.8–5.6)
Mean Plasma Glucose: 91.06 mg/dL

## 2023-07-29 LAB — URINALYSIS, ROUTINE W REFLEX MICROSCOPIC
Bilirubin Urine: NEGATIVE
Glucose, UA: NEGATIVE mg/dL
Hgb urine dipstick: NEGATIVE
Ketones, ur: NEGATIVE mg/dL
Leukocytes,Ua: NEGATIVE
Nitrite: NEGATIVE
Protein, ur: NEGATIVE mg/dL
Specific Gravity, Urine: 1.003 — ABNORMAL LOW (ref 1.005–1.030)
pH: 7 (ref 5.0–8.0)

## 2023-07-29 LAB — PROTIME-INR
INR: 1.2 (ref 0.8–1.2)
Prothrombin Time: 15 s (ref 11.4–15.2)

## 2023-07-29 LAB — ETHANOL: Alcohol, Ethyl (B): <10 mg/dL (ref <10)

## 2023-07-29 MED ORDER — ENOXAPARIN SODIUM 40 MG/0.4ML IJ SOSY
40.0000 mg | PREFILLED_SYRINGE | INTRAMUSCULAR | Status: DC
  Administered 2023-07-29 – 2023-07-30 (×2): 40 mg via SUBCUTANEOUS
  Filled 2023-07-29 (×2): qty 0.4

## 2023-07-29 MED ORDER — ATORVASTATIN CALCIUM 40 MG PO TABS
40.0000 mg | ORAL_TABLET | Freq: Every day | ORAL | Status: DC
  Administered 2023-07-29: 40 mg via ORAL
  Filled 2023-07-29: qty 1

## 2023-07-29 MED ORDER — CLOPIDOGREL BISULFATE 75 MG PO TABS
75.0000 mg | ORAL_TABLET | Freq: Every day | ORAL | Status: DC
  Administered 2023-07-30: 75 mg via ORAL
  Filled 2023-07-29: qty 1

## 2023-07-29 MED ORDER — ACETAMINOPHEN 325 MG PO TABS: 650.0000 mg | ORAL_TABLET | ORAL | Status: DC | PRN

## 2023-07-29 MED ORDER — POTASSIUM CHLORIDE CRYS ER 20 MEQ PO TBCR
40.0000 meq | EXTENDED_RELEASE_TABLET | Freq: Once | ORAL | Status: AC
Start: 2023-07-29 — End: 2023-07-29
  Administered 2023-07-29: 40 meq via ORAL
  Filled 2023-07-29: qty 2

## 2023-07-29 MED ORDER — ASPIRIN 325 MG PO TABS
325.0000 mg | ORAL_TABLET | Freq: Every day | ORAL | Status: DC
Start: 2023-07-29 — End: 2023-07-29
  Administered 2023-07-29: 325 mg via ORAL
  Filled 2023-07-29: qty 1

## 2023-07-29 MED ORDER — ASPIRIN 300 MG RE SUPP: 300.0000 mg | Freq: Every day | RECTAL | Status: DC

## 2023-07-29 MED ORDER — ASPIRIN 81 MG PO TBEC
81.0000 mg | DELAYED_RELEASE_TABLET | Freq: Every day | ORAL | Status: DC
  Administered 2023-07-30: 81 mg via ORAL
  Filled 2023-07-29: qty 1

## 2023-07-29 MED ORDER — ACETAMINOPHEN 650 MG RE SUPP: 650.0000 mg | RECTAL | Status: DC | PRN

## 2023-07-29 MED ORDER — SENNOSIDES-DOCUSATE SODIUM 8.6-50 MG PO TABS: 1.0000 | ORAL_TABLET | Freq: Every evening | ORAL | Status: DC | PRN

## 2023-07-29 MED ORDER — STROKE: EARLY STAGES OF RECOVERY BOOK
Freq: Once | Status: AC
  Filled 2023-07-29: qty 1

## 2023-07-29 MED ORDER — CLOPIDOGREL BISULFATE 300 MG PO TABS
300.0000 mg | ORAL_TABLET | Freq: Once | ORAL | Status: AC
  Administered 2023-07-29: 300 mg via ORAL
  Filled 2023-07-29: qty 1

## 2023-07-29 MED ORDER — ACETAMINOPHEN 160 MG/5ML PO SOLN: 650.0000 mg | ORAL | Status: DC | PRN

## 2023-07-29 NOTE — H&P (Signed)
History and Physical    Derek [name] ZOX:[id number] DOB: 04-Apr-1946 DOA: 07/29/2023  PCP: [facility]   Patient coming from: Home  I have personally briefly reviewed patient's old medical records in [facility] Link  Chief Complaint: Numbness and weakness of left arm  HPI: Derek [name] is a 77 y.o. male with medical history significant of GERD, hyperlipidemia, obesity and recent diagnosis of multiple myeloma currently on chemotherapy presented with left arm weakness and numbness that started around 12:30 AM and lasted for around an hour.  He states that he is supposed to drink 8 glasses of water with his new medicine and he only drank 2 yesterday and thought that he became dehydrated.  After he drank some more water today, his symptoms resolved and have not returned.  He got worried about his weakness and numbness and presented to the ED.  He denies any facial droop, slurred speech, vision changes, headache, loss of consciousness, seizures.  Patient has a wheelchair to ambulate at home.  No fever, cough, shortness of breath, vomiting, abdominal pain, diarrhea or dysuria noted.  ED Course: MRI of brain and MRA of brain were negative for acute process.  ED provider consulted neurology who recommended observation to complete workup. Hospitalist service was called to evaluate the patient.  Review of Systems: As per HPI otherwise all other systems were reviewed and are negative.   Past Medical History:  Diagnosis Date   Arthritis    left knee   Cancer (HCC)    GERD (gastroesophageal reflux disease)    Hypercholesterolemia    Obesity    Urethritis    when he was in [employer]    Past Surgical History:  Procedure Laterality Date   ANTERIOR CERVICAL DECOMP/DISCECTOMY FUSION N/A 05/06/2021   Procedure: Anterior Cervical Discectomy Fusion Cervical Three-Four, Cervical Four-Five;  Surgeon: Bedelia Person, MD;  Location: [facility] OR;  Service: Neurosurgery;  Laterality: N/A;    COLONOSCOPY     IR BONE MARROW BIOPSY & ASPIRATION  04/27/2023   ORIF FEMUR FRACTURE Left 12/06/2022   Procedure: OPEN REDUCTION INTERNAL FIXATION (ORIF) DISTAL FEMUR FRACTURE;  Surgeon: Roby Lofts, MD;  Location: [facility] OR;  Service: Orthopedics;  Laterality: Left;   TOTAL KNEE ARTHROPLASTY Left 07/22/2020   Procedure: LEFT TOTAL KNEE ARTHROPLASTY;  Surgeon: Kathryne Hitch, MD;  Location: [facility] OR;  Service: Orthopedics;  Laterality: Left;   TRANSFORAMINAL LUMBAR INTERBODY FUSION (TLIF) WITH PEDICLE SCREW FIXATION 1 LEVEL N/A 10/20/2021   Procedure: Transforaminal Lumbar Interbody Fusion Lumbar Four-Five;  Surgeon: Bedelia Person, MD;  Location: [facility] OR;  Service: Neurosurgery;  Laterality: N/A;  Transforaminal Lumbar Interbody Fusion Lumbar Four-Five     reports that he has never smoked. He has never used smokeless tobacco. He reports that he does not drink alcohol and does not use drugs.  No Known Allergies  Family History  Problem Relation Age of Onset   Cancer Mother        breast   Diabetes Father     Prior to Admission medications   Medication Sig Start Date End Date Taking? Authorizing Provider  Butenafine HCl 1 % cream Apply 30 g topically 2 (two) times daily. 02/24/22   Daryll Drown, NP  cetirizine (ZYRTEC) 10 MG tablet Take 10 mg by mouth daily.    [provider]  cholecalciferol (VITAMIN D) 1000 UNITS tablet Take 1,000 Units by mouth daily.    [provider]  Nutritional Supplements (PROSTATE PO) Take 1 tablet by  mouth 2 (two) times daily.    [provider]  nystatin cream (MYCOSTATIN) Apply 1 Application topically 2 (two) times daily. 08/05/22   Jannifer Rodney A, FNP  trolamine salicylate (ASPERCREME) 10 % cream Apply 1 application topically as needed for muscle pain.    [provider]    Physical Exam: Vitals:   07/29/23 0455 07/29/23 0456 07/29/23 0500 07/29/23 0843  BP:   (!) 104/54 (!) 144/44  Pulse: 82   84  Resp:  17   16  Temp: 97.8 F (36.6 C)   98 F (36.7 C)  TempSrc: Oral   Oral  SpO2: 98%   98%  Weight:  106.6 kg    Height:  5\' 6"  (1.676 m)      Constitutional: NAD, calm, comfortable.  Chronically ill and deconditioned.  On room air. Vitals:   07/29/23 0455 07/29/23 0456 07/29/23 0500 07/29/23 0843  BP:   (!) 104/54 (!) 144/44  Pulse: 82   84  Resp: 17   16  Temp: 97.8 F (36.6 C)   98 F (36.7 C)  TempSrc: Oral   Oral  SpO2: 98%   98%  Weight:  106.6 kg    Height:  5\' 6"  (1.676 m)     Eyes: PERRL, lids and conjunctivae normal ENMT: Mucous membranes are moist. Posterior pharynx clear of any exudate or lesions. Neck: normal, supple, no masses, no thyromegaly Respiratory: bilateral decreased breath sounds at bases, no wheezing, no crackles. Normal respiratory effort. No accessory muscle use.  Cardiovascular: S1 S2 positive, rate controlled.  Bilateral lower extremity edema present.  2+ pedal pulses.  Abdomen: Obese, no tenderness, no masses palpated. No hepatosplenomegaly. Bowel sounds positive.  Musculoskeletal: no clubbing / cyanosis. No joint deformity upper and lower extremities.  Skin: no rashes, lesions, ulcers. No induration Neurologic: CN 2-12 grossly intact. Moving extremities. No focal neurologic deficits.  Psychiatric: Normal judgment and insight. Alert and oriented x 3. Normal mood.    Labs on Admission: I have personally reviewed following labs and imaging studies  CBC: No results for input(s): "WBC", "NEUTROABS", "HGB", "HCT", "MCV", "PLT" in the last 168 hours. Basic Metabolic Panel: Recent Labs  Lab 07/29/23 0503  NA 133*  K 3.4*  CL 99  CO2 25  GLUCOSE 113*  BUN 10  CREATININE 0.69  CALCIUM 8.8*   GFR: Estimated Creatinine Clearance: 91.3 mL/min (by C-G formula based on SCr of 0.69 mg/dL). Liver Function Tests: Recent Labs  Lab 07/29/23 0503  AST 13*  ALT 22  ALKPHOS 60  BILITOT 0.6  PROT 5.9*  ALBUMIN 3.2*   No results for input(s):  "LIPASE", "AMYLASE" in the last 168 hours. No results for input(s): "AMMONIA" in the last 168 hours. Coagulation Profile: Recent Labs  Lab 07/29/23 0503  INR 1.2   Cardiac Enzymes: No results for input(s): "CKTOTAL", "CKMB", "CKMBINDEX", "TROPONINI" in the last 168 hours. BNP (last 3 results) No results for input(s): "PROBNP" in the last 8760 hours. HbA1C: No results for input(s): "HGBA1C" in the last 72 hours. CBG: No results for input(s): "GLUCAP" in the last 168 hours. Lipid Profile: Recent Labs    07/29/23 0503  CHOL 149  HDL 51  LDLCALC 84  TRIG 71  CHOLHDL 2.9   Thyroid Function Tests: No results for input(s): "TSH", "T4TOTAL", "FREET4", "T3FREE", "THYROIDAB" in the last 72 hours. Anemia Panel: No results for input(s): "VITAMINB12", "FOLATE", "FERRITIN", "TIBC", "IRON", "RETICCTPCT" in the last 72 hours. Urine analysis:    Component  Value Date/Time   COLORURINE YELLOW 04/19/2014 2103   APPEARANCEUR Clear 02/22/2022 1458   LABSPEC 1.023 04/19/2014 2103   PHURINE 5.5 04/19/2014 2103   GLUCOSEU Negative 02/22/2022 1458   HGBUR NEGATIVE 04/19/2014 2103   BILIRUBINUR Negative 02/22/2022 1458   KETONESUR NEGATIVE 04/19/2014 2103   PROTEINUR Negative 02/22/2022 1458   PROTEINUR NEGATIVE 04/19/2014 2103   UROBILINOGEN negative 05/17/2014 1043   UROBILINOGEN 0.2 04/19/2014 2103   NITRITE Negative 02/22/2022 1458   NITRITE NEGATIVE 04/19/2014 2103   LEUKOCYTESUR 2+ (A) 02/22/2022 1458    Radiological Exams on Admission: MR ANGIO HEAD WO CONTRAST Result Date: 07/29/2023 CLINICAL DATA:  77 year old male with multiple myeloma. Left arm numbness, generalized weakness, undergoing cancer treatment. EXAM: MRA HEAD WITHOUT CONTRAST TECHNIQUE: Angiographic images of the Circle of Willis were acquired using MRA technique without intravenous contrast. COMPARISON:  Brain MRI today. FINDINGS: Anterior circulation: Antegrade flow in both ICA siphons. Partially visible cervical  right ICA tortuosity in the neck. No evidence of siphon stenosis. Ophthalmic and posterior communicating artery origins appear normal. Patent carotid termini. MCA and ACA origins appear normal. Anterior communicating artery and visible ACA branches are within normal limits. MCA M1 segments and visible bilateral MCA branches are within normal limits. Posterior circulation: Antegrade flow in the posterior circulation with codominant distal vertebral arteries. PICA origins appear patent and normal. Distal vertebral arteries, vertebrobasilar junction and basilar artery appear patent without stenosis. SCA, PCA origins and bilateral posterior communicating arteries appear normal. Bilateral PCA branches are within normal limits. Anatomic variants: None. Other: Brain MRI today reported separately. IMPRESSION: Negative intracranial MRA. Brain MRI today reported separately. Electronically Signed   By: Odessa Fleming M.D.   On: 07/29/2023 06:30   MR BRAIN WO CONTRAST Result Date: 07/29/2023 CLINICAL DATA:  77 year old male with multiple myeloma. Left arm numbness, generalized weakness, undergoing cancer treatment. EXAM: MRI HEAD WITHOUT CONTRAST TECHNIQUE: Multiplanar, multiecho pulse sequences of the brain and surrounding structures were obtained without intravenous contrast. COMPARISON:  PET-CT 04/14/2023. Cervical spine MRI 04/21/2021. Intracranial MRA today reported separately. FINDINGS: Brain: No restricted diffusion to suggest acute infarction. No midline shift, mass effect, evidence of mass lesion, ventriculomegaly, extra-axial collection or acute intracranial hemorrhage. Pituitary is normal. Abnormally tapered appearance of the medulla, abnormal cervicomedullary junction which appears atrophic, and the visible upper cervical spinal cord appears highly atrophic and diminutive (series 8, image 1 and series 7, image 12). Similar appearance on the 2022 cervical spine MRI. Mild to moderate patchy generalized T2 and FLAIR  hyperintensity in the pons, but pontine and mid brain volume appears relatively maintained. Similar T2 heterogeneity in the bilateral deep gray nuclei, more pronounced in the right basal ganglia. Similar deep white matter signal changes bilaterally. And scattered periventricular and subcortical white matter changes, mild to moderate for age. No supratentorial cortical encephalomalacia. No chronic cerebral blood products on SWI. No contrast administered. Vascular: Major intracranial vascular flow voids are preserved. Skull and upper cervical spine: Artifact from new cervical ACDF since the 2022 MRI. Visible bone marrow signal is within normal limits. Abnormal cervical spinal cord as above. Sinuses/Orbits: Chronic left lamina papyracea fracture, chronic left frontal sinus and ethmoid sinus disease appears stable from the August PET-CT Orbits soft tissues are within normal limits. Other: Mild left mastoid effusion, stable or improved since August. Grossly normal other visible internal auditory structures. Negative visible scalp and face. IMPRESSION: 1. No acute intracranial abnormality on this noncontrast exam. 2. For pronounced chronic cervical spinal cord myelomalacia, with associated malacia  of the lower brainstem also. Superimposed moderate signal changes in the deep gray nuclei, pons, and scattered in the bilateral cerebral white matter which are nonspecific but most commonly chronic small vessel disease related. 3. Evidence of cervical spine ACDF since 2022. 4. Intracranial MRA reported separately. Electronically Signed   By: Odessa Fleming M.D.   On: 07/29/2023 06:27    Assessment/Plan  Probable TIA presenting with left-sided weakness and numbness Hyperlipidemia -Symptoms have resolved.  MRI/MRI of brain negative. -Await neurology evaluation.  PT/OT/SLP evaluation.  A1c 4.8.  LDL 84. -Aspirin, Plavix, Lipitor.  2D echo.  Fall precautions.  Patient might get discharged later today once 2D echo and other  evaluations get done and if cleared by neurology  Hypokalemia -Replace.  Hyponatremia -Mild.  Multiple myeloma -Recent diagnosis.  Currently on chemotherapy as an outpatient by oncology at [facility]  Obesity -Outpatient follow-up  Physical deconditioning -Patient has been mostly wheelchair-bound recently at home.  Outpatient follow-up.  DVT prophylaxis: Lovenox Code Status: Full Family Communication: None at bedside Disposition Plan: Home today or tomorrow pending workup Consults called: Neurology Admission status: Observation/telemetry  Severity of Illness: The appropriate patient status for this patient is OBSERVATION. Observation status is judged to be reasonable and necessary in order to provide the required intensity of service to ensure the patient's safety. The patient's presenting symptoms, physical exam findings, and initial radiographic and laboratory data in the context of their medical condition is felt to place them at decreased risk for further clinical deterioration. Furthermore, it is anticipated that the patient will be medically stable for discharge from the hospital within 2 midnights of admission.     Glade Lloyd MD [facility]  07/29/2023, 8:55 AM

## 2023-07-29 NOTE — ED Notes (Signed)
Patient transported to MRI 

## 2023-07-29 NOTE — Consult Note (Signed)
NEUROLOGY CONSULT NOTE   Date of service: July 29, 2023 Patient Name: Derek [name] MRN:  [id number] DOB:  Aug 03, 1946 Chief Complaint: "numbness and weakness of left arm " Requesting Provider: Glade Lloyd, MD  History of Present Illness  Derek [name] is a 77 y.o. male  has a past medical history of Arthritis, Cancer (HCC), GERD (gastroesophageal reflux disease), Hypercholesterolemia, Obesity, and Urethritis. who presents with  left arm weakness and numbness that started around 1230 and lasted about 3 hours. He states he is supposed to drink 8 glasses of water with his new medicine and he only drank 2 yesterday and thinks he became dehydrated. He states that after he drank some water his symptoms resolved and have not returned. He denies any new weakness, facial droop, slurred speech, vision changes or headache. He uses assistive devices to walk around due to his chronic leg weakness.MRI brain with no acute process. MRA brain negative. Patient to have TIA workup.   NIHSS  1a Level of Conscious.: 0 1b LOC Questions: 0 1c LOC Commands: 0 2 Best Gaze: 0 3 Visual: 0 4 Facial Palsy: 0 5a Motor Arm - left: 0 5b Motor Arm - Right: 0 6a Motor Leg - Left: 3 (chronic) 6b Motor Leg - Right: 3 (chronic)  7 Limb Ataxia: 0 8 Sensory: 1 (chronic) 9 Best Language: 0 10 Dysarthria: 0 11 Extinct. and Inatten.: 0 TOTAL: 7  ROS  Comprehensive ROS performed and pertinent positives documented in HPI  Past History   Past Medical History:  Diagnosis Date   Arthritis    left knee   Cancer (HCC)    GERD (gastroesophageal reflux disease)    Hypercholesterolemia    Obesity    Urethritis    when he was in [employer]    Past Surgical History:  Procedure Laterality Date   ANTERIOR CERVICAL DECOMP/DISCECTOMY FUSION N/A 05/06/2021   Procedure: Anterior Cervical Discectomy Fusion Cervical Three-Four, Cervical Four-Five;  Surgeon: Bedelia Person, MD;  Location: [facility] OR;  Service: Neurosurgery;   Laterality: N/A;   COLONOSCOPY     IR BONE MARROW BIOPSY & ASPIRATION  04/27/2023   ORIF FEMUR FRACTURE Left 12/06/2022   Procedure: OPEN REDUCTION INTERNAL FIXATION (ORIF) DISTAL FEMUR FRACTURE;  Surgeon: Roby Lofts, MD;  Location: [facility] OR;  Service: Orthopedics;  Laterality: Left;   TOTAL KNEE ARTHROPLASTY Left 07/22/2020   Procedure: LEFT TOTAL KNEE ARTHROPLASTY;  Surgeon: Kathryne Hitch, MD;  Location: [facility] OR;  Service: Orthopedics;  Laterality: Left;   TRANSFORAMINAL LUMBAR INTERBODY FUSION (TLIF) WITH PEDICLE SCREW FIXATION 1 LEVEL N/A 10/20/2021   Procedure: Transforaminal Lumbar Interbody Fusion Lumbar Four-Five;  Surgeon: Bedelia Person, MD;  Location: [facility] OR;  Service: Neurosurgery;  Laterality: N/A;  Transforaminal Lumbar Interbody Fusion Lumbar Four-Five    Family History: Family History  Problem Relation Age of Onset   Cancer Mother        breast   Diabetes Father     Social History  reports that he has never smoked. He has never used smokeless tobacco. He reports that he does not drink alcohol and does not use drugs.  No Known Allergies  Medications   Current Facility-Administered Medications:    [START ON 07/30/2023]  stroke: early stages of recovery book, , Does not apply, Once, Alekh, Kshitiz, MD   acetaminophen (TYLENOL) tablet 650 mg, 650 mg, Oral, Q4H PRN **OR** acetaminophen (TYLENOL) 160 MG/5ML solution 650 mg, 650 mg, Per Tube, Q4H PRN **OR** acetaminophen (TYLENOL) suppository 650 mg,  650 mg, Rectal, Q4H PRN, Hanley Ben, Kshitiz, MD   aspirin suppository 300 mg, 300 mg, Rectal, Daily **OR** aspirin tablet 325 mg, 325 mg, Oral, Daily, Alekh, Kshitiz, MD, 325 mg at 07/29/23 1020   enoxaparin (LOVENOX) injection 40 mg, 40 mg, Subcutaneous, Q24H, Alekh, Kshitiz, MD, 40 mg at 07/29/23 1020   senna-docusate (Senokot-S) tablet 1 tablet, 1 tablet, Oral, QHS PRN, Hanley Ben, Kshitiz, MD  Current Outpatient Medications:    Butenafine HCl 1 % cream, Apply 30 g topically  2 (two) times daily., Disp: 24 g, Rfl: 0   cetirizine (ZYRTEC) 10 MG tablet, Take 10 mg by mouth daily., Disp: , Rfl:    cholecalciferol (VITAMIN D) 1000 UNITS tablet, Take 1,000 Units by mouth daily., Disp: , Rfl:    Nutritional Supplements (PROSTATE PO), Take 1 tablet by mouth 2 (two) times daily., Disp: , Rfl:    nystatin cream (MYCOSTATIN), Apply 1 Application topically 2 (two) times daily., Disp: 60 g, Rfl: 1   trolamine salicylate (ASPERCREME) 10 % cream, Apply 1 application topically as needed for muscle pain., Disp: , Rfl:   Vitals   Vitals:   07/29/23 0455 07/29/23 0456 07/29/23 0500 07/29/23 0843  BP:   (!) 104/54 (!) 144/44  Pulse: 82   84  Resp: 17   16  Temp: 97.8 F (36.6 C)   98 F (36.7 C)  TempSrc: Oral   Oral  SpO2: 98%   98%  Weight:  106.6 kg    Height:  5\' 6"  (1.676 m)      Body mass index is 37.93 kg/m.  Physical Exam   Constitutional: Appears well-developed and well-nourished.  Psych: Affect appropriate to situation.  Eyes: No scleral injection.  HENT: No OP obstruction.  Head: Normocephalic.  Cardiovascular: Normal rate and regular rhythm.  Respiratory: Effort normal, non-labored breathing.  GI: Soft.  No distension. There is no tenderness.  Skin: WDI.   Neurologic Examination    Mental Status -  Level of arousal and orientation to time, place, and person were intact. Language including expression, naming, repetition, comprehension was assessed and found intact. Attention span and concentration were normal. Cranial Nerves II - XII - II - Visual field intact OU. III, IV, VI - Extraocular movements intact. V - Facial sensation intact bilaterally. VII - Facial movement intact bilaterally. VIII - Hearing & vestibular intact bilaterally. X - Palate elevates symmetrically. XI - Chin turning & shoulder shrug intact bilaterally. XII - Tongue protrusion intact.  Motor Strength - bilateral uppers 5/5, bilateral lowers 3/5, can barely lift off the  bed which he states is baseline for him. Dorsiflexion and extension 5/5.   Motor Tone - Muscle tone was assessed at the neck and appendages and was normal. Sensory - chronic right lower leg decreased  Coordination - The patient had normal movements in the hands and feet with no ataxia or dysmetria.  Tremor was absent . Gait and Station - deferred.  Labs/Imaging/Neurodiagnostic studies   CBC: No results for input(s): "WBC", "NEUTROABS", "HGB", "HCT", "MCV", "PLT" in the last 168 hours.  Basic Metabolic Panel:  Lab Results  Component Value Date   NA 133 (L) 07/29/2023   K 3.4 (L) 07/29/2023   CO2 25 07/29/2023   GLUCOSE 113 (H) 07/29/2023   BUN 10 07/29/2023   CREATININE 0.69 07/29/2023   CALCIUM 8.8 (L) 07/29/2023   GFRNONAA >60 07/29/2023   GFRAA 59 (L) 11/25/2016    Lipid Panel:  Lab Results  Component Value Date  LDLCALC 84 07/29/2023    HgbA1c:  Lab Results  Component Value Date   HGBA1C 4.8 07/29/2023    Urine Drug Screen: No results found for: "LABOPIA", "COCAINSCRNUR", "LABBENZ", "AMPHETMU", "THCU", "LABBARB"   Alcohol Level     Component Value Date/Time   ETH <10 07/29/2023 0503    INR  Lab Results  Component Value Date   INR 1.2 07/29/2023    APTT  Lab Results  Component Value Date   APTT 27 12/04/2022    AED levels: No results found for: "PHENYTOIN", "ZONISAMIDE", "LAMOTRIGINE", "LEVETIRACETA"   MR Angio head without contrast: Negative    MRI Brain: No acute process   LABS: LDL 84 A1c 4.8 K 3.4 NA 133   ASSESSMENT   Sergio Beaver is a 77 y.o. male  has a past medical history of Arthritis, Cancer (HCC), GERD (gastroesophageal reflux disease), Hypercholesterolemia, Obesity, and Urethritis. Presents with left arm numbness and weakness lasting about an  hour and resolved. MRI/MRA negative   RECOMMENDATIONS   - Frequent neuro checks - Echocardiogram - US carotids bilateral - Prophylactic therapy-Antiplatelet med: Aspirin - dose  81mg  and plavix 75mg  daily  after 300mg  load - already given  - statin for LDL goal < 70 - Risk factor modification - Telemetry monitoring - PT consult, OT consult, Speech consult - Stroke team to follow ______________________________________________________________________   Gevena Mart DNP, ACNPC-AG  [facility]  I have seen the patient reviewed the above note.  He has multiple stroke risk factors and had transient focal neurological symptoms.  I think TIA is the most likely culprit.  Workup as above.  Ritta Slot, MD [facility] [phone]  If 7pm- 7am, please page neurology on call as listed in AMION.

## 2023-07-29 NOTE — Plan of Care (Signed)
  Problem: Clinical Measurements: Goal: Respiratory complications will improve Outcome: Progressing Goal: Cardiovascular complication will be avoided Outcome: Progressing   Problem: Activity: Goal: Risk for activity intolerance will decrease Outcome: Progressing   Problem: Coping: Goal: Level of anxiety will decrease Outcome: Progressing   Problem: Pain Management: Goal: General experience of comfort will improve Outcome: Progressing   Problem: Safety: Goal: Ability to remain free from injury will improve Outcome: Progressing   Problem: Skin Integrity: Goal: Risk for impaired skin integrity will decrease Outcome: Progressing

## 2023-07-29 NOTE — Progress Notes (Signed)
OT Cancellation Note  Patient Details Name: [name] MRN: [id number] DOB: 11/06/1945   Cancelled Treatment:    Reason Eval/Treat Not Completed: Patient at procedure or test/ unavailable. Pt is undergoing a test in his room.   Reuben Likes, OTR/L 07/29/2023, 4:15 PM

## 2023-07-29 NOTE — TOC Initial Note (Signed)
Transition of Care [facility]) - Initial/Assessment Note    Patient Details  Name: Derek [name] MRN: [id number] Date of Birth: 1946/07/31  [facility] CM/SW Contact:    Lanier Clam, RN Phone Number: 07/29/2023, 4:05 PM  Clinical Narrative: Patient defers to Derek(spouse) Corrie [name] declines [facility] SNF prefers returning home-currently receive [facility] 3x/week-will continue. Has own transport home.                  Expected Discharge Plan: Home/Self Care Barriers to Discharge: Continued Medical Work up   Patient Goals and CMS Choice Patient states their goals for this hospitalization and ongoing recovery are:: Home CMS Medicare.gov Compare Post Acute Care list provided to:: Patient Represenative (must comment) (Derek(spouse)) Choice offered to / list presented to : Spouse [place] ownership interest in [facility].provided to:: Spouse    Expected Discharge Plan and Services   Discharge Planning Services: CM Consult Post Acute Care Choice: Resumption of Svcs/PTA Provider Living arrangements for the past 2 months: Single Family Home                                      Prior Living Arrangements/Services Living arrangements for the past 2 months: Single Family Home Lives with:: Spouse              Current home services: DME (rw;[facility] aide service 3x/week)    Activities of Daily Living   ADL Screening (condition at time of admission) Independently performs ADLs?: Yes (appropriate for developmental age) Is the patient deaf or have difficulty hearing?: No Does the patient have difficulty seeing, even when wearing glasses/contacts?: No Does the patient have difficulty concentrating, remembering, or making decisions?: No  Permission Sought/Granted                  Emotional Assessment              Admission diagnosis:  TIA (transient ischemic attack) [G45.9] Hypokalemia [E87.6] Hyponatremia [E87.1] Normochromic normocytic anemia  [D64.9] Elevated random blood glucose level [R73.9] Patient Active Problem List   Diagnosis Date Noted   TIA (transient ischemic attack) 07/29/2023   Closed fracture of left distal femur (HCC) 12/04/2022   Spondylolisthesis, lumbar region 12/22/2021   Lumbar stenosis 10/20/2021   Stenosis of cervical spine with myelopathy (HCC) 05/06/2021   Status post total left knee replacement 07/22/2020   Unilateral primary osteoarthritis, left knee 03/24/2020   Knee pain, chronic 06/15/2016   GERD (gastroesophageal reflux disease)    Morbid obesity (HCC)    Urethritis 03/20/2013   PCP:  Cl[facility], [facility]:   [facility] [address] [place] - [address] [place] [address] Phone: [phone] Fax: [phone]  [facility] [address] [place] [address] Phone: [phone] Fax: [phone]     Social Drivers of Health (SDOH) Social History: SDOH Screenings   Food Insecurity: No Food Insecurity (07/29/2023)  Housing: Low Risk  (07/29/2023)  Transportation Needs: No Transportation Needs (07/29/2023)  Utilities: Not At Risk (07/29/2023)  Alcohol Screen: Low Risk  (12/24/2022)  Depression (PHQ2-9): Medium Risk (04/05/2023)  Financial Resource Strain: Low Risk  (12/24/2022)  Physical Activity: Inactive (04/05/2023)  Social Connections: Socially Isolated (12/24/2022)  Stress: Stress Concern Present (04/05/2023)  Tobacco Use: Low Risk  (07/29/2023)   SDOH Interventions:     Readmission Risk Interventions  No data to display

## 2023-07-29 NOTE — ED Provider Notes (Signed)
[place] EMERGENCY DEPARTMENT AT [facility] Provider Note   CSN: [id number] Arrival date & time: 07/29/23  7829     History  Chief Complaint  Patient presents with   Weakness   Numbness    Derek [name] is a 77 y.o. male.  The history is provided by the patient.  Weakness He has history of myeloma diagnosed 2 months ago, and comes in because of an episode of left arm numbness which started about 12:30 AM and lasted about 1 hour.  He denies any arm or face numbness and denies any weakness.  He denies any headache.   Home Medications Prior to Admission medications   Medication Sig Start Date End Date Taking? Authorizing Provider  Butenafine HCl 1 % cream Apply 30 g topically 2 (two) times daily. 02/24/22   Daryll Drown, NP  cetirizine (ZYRTEC) 10 MG tablet Take 10 mg by mouth daily.    [provider]  cholecalciferol (VITAMIN D) 1000 UNITS tablet Take 1,000 Units by mouth daily.    [provider]  Nutritional Supplements (PROSTATE PO) Take 1 tablet by mouth 2 (two) times daily.    [provider]  nystatin cream (MYCOSTATIN) Apply 1 Application topically 2 (two) times daily. 08/05/22   Jannifer Rodney A, FNP  trolamine salicylate (ASPERCREME) 10 % cream Apply 1 application topically as needed for muscle pain.    [provider]      Allergies    Patient has no known allergies.    Review of Systems   Review of Systems  Neurological:  Positive for weakness.  All other systems reviewed and are negative.   Physical Exam Updated Vital Signs BP (!) 104/54   Pulse 82   Temp 97.8 F (36.6 C) (Oral)   Resp 17   Ht 5\' 6"  (1.676 m)   Wt 106.6 kg   SpO2 98%   BMI 37.93 kg/m  Physical Exam Vitals and nursing note reviewed.   77 year old male, resting comfortably and in no acute distress. Vital signs are normal. Oxygen saturation is 98%, which is normal. Head is normocephalic and atraumatic. PERRLA, EOMI. Oropharynx is  clear. Neck is nontender and supple without adenopathy or JVD.  There are no carotid bruits. Back is nontender and there is no CVA tenderness. Lungs are clear without rales, wheezes, or rhonchi. Chest is nontender. Heart has regular rate and rhythm without murmur. Abdomen is soft, flat, nontender. Extremities have 3+ lymphedema. Skin is warm and dry without rash. Neurologic: Mental status is normal.  There is questionable left central facial droop, remainder of cranial nerves are intact.  Strength is 5/5 in both arms, 4/5 in both legs (chronic leg weakness unchanged from baseline per patient).  There is no pronator drift.  Sensation is normal except for decreased sensation of the right lower leg which is chronic.  There is no extinction on double simultaneous stimulation.  ED Results / Procedures / Treatments   Labs (all labs ordered are listed, but only abnormal results are displayed) Labs Reviewed  COMPREHENSIVE METABOLIC PANEL - Abnormal; Notable for the following components:      Result Value   Sodium 133 (*)    Potassium 3.4 (*)    Glucose, Bld 113 (*)    Calcium 8.8 (*)    Total Protein 5.9 (*)    Albumin 3.2 (*)    AST 13 (*)    All other components within normal limits  ETHANOL  PROTIME-INR  RAPID  URINE DRUG SCREEN, HOSP PERFORMED  URINALYSIS, ROUTINE W REFLEX MICROSCOPIC   Radiology MR ANGIO HEAD WO CONTRAST Result Date: 07/29/2023 CLINICAL DATA:  77 year old male with multiple myeloma. Left arm numbness, generalized weakness, undergoing cancer treatment. EXAM: MRA HEAD WITHOUT CONTRAST TECHNIQUE: Angiographic images of the Circle of Willis were acquired using MRA technique without intravenous contrast. COMPARISON:  Brain MRI today. FINDINGS: Anterior circulation: Antegrade flow in both ICA siphons. Partially visible cervical right ICA tortuosity in the neck. No evidence of siphon stenosis. Ophthalmic and posterior communicating artery origins appear normal. Patent carotid  termini. MCA and ACA origins appear normal. Anterior communicating artery and visible ACA branches are within normal limits. MCA M1 segments and visible bilateral MCA branches are within normal limits. Posterior circulation: Antegrade flow in the posterior circulation with codominant distal vertebral arteries. PICA origins appear patent and normal. Distal vertebral arteries, vertebrobasilar junction and basilar artery appear patent without stenosis. SCA, PCA origins and bilateral posterior communicating arteries appear normal. Bilateral PCA branches are within normal limits. Anatomic variants: None. Other: Brain MRI today reported separately. IMPRESSION: Negative intracranial MRA. Brain MRI today reported separately. Electronically Signed   By: Odessa Fleming M.D.   On: 07/29/2023 06:30   MR BRAIN WO CONTRAST Result Date: 07/29/2023 CLINICAL DATA:  77 year old male with multiple myeloma. Left arm numbness, generalized weakness, undergoing cancer treatment. EXAM: MRI HEAD WITHOUT CONTRAST TECHNIQUE: Multiplanar, multiecho pulse sequences of the brain and surrounding structures were obtained without intravenous contrast. COMPARISON:  PET-CT 04/14/2023. Cervical spine MRI 04/21/2021. Intracranial MRA today reported separately. FINDINGS: Brain: No restricted diffusion to suggest acute infarction. No midline shift, mass effect, evidence of mass lesion, ventriculomegaly, extra-axial collection or acute intracranial hemorrhage. Pituitary is normal. Abnormally tapered appearance of the medulla, abnormal cervicomedullary junction which appears atrophic, and the visible upper cervical spinal cord appears highly atrophic and diminutive (series 8, image 1 and series 7, image 12). Similar appearance on the 2022 cervical spine MRI. Mild to moderate patchy generalized T2 and FLAIR hyperintensity in the pons, but pontine and mid brain volume appears relatively maintained. Similar T2 heterogeneity in the bilateral deep gray nuclei,  more pronounced in the right basal ganglia. Similar deep white matter signal changes bilaterally. And scattered periventricular and subcortical white matter changes, mild to moderate for age. No supratentorial cortical encephalomalacia. No chronic cerebral blood products on SWI. No contrast administered. Vascular: Major intracranial vascular flow voids are preserved. Skull and upper cervical spine: Artifact from new cervical ACDF since the 2022 MRI. Visible bone marrow signal is within normal limits. Abnormal cervical spinal cord as above. Sinuses/Orbits: Chronic left lamina papyracea fracture, chronic left frontal sinus and ethmoid sinus disease appears stable from the August PET-CT Orbits soft tissues are within normal limits. Other: Mild left mastoid effusion, stable or improved since August. Grossly normal other visible internal auditory structures. Negative visible scalp and face. IMPRESSION: 1. No acute intracranial abnormality on this noncontrast exam. 2. For pronounced chronic cervical spinal cord myelomalacia, with associated malacia of the lower brainstem also. Superimposed moderate signal changes in the deep gray nuclei, pons, and scattered in the bilateral cerebral white matter which are nonspecific but most commonly chronic small vessel disease related. 3. Evidence of cervical spine ACDF since 2022. 4. Intracranial MRA reported separately. Electronically Signed   By: Odessa Fleming M.D.   On: 07/29/2023 06:27    Procedures Procedures    Medications Ordered in ED Medications  clopidogrel (PLAVIX) tablet 300 mg (has no administration in time range)  potassium chloride SA (KLOR-CON M) CR tablet 40 mEq (40 mEq Oral Given 07/29/23 0645)    ED Course/ Medical Decision Making/ A&P                                 Medical Decision Making Amount and/or Complexity of Data Reviewed Labs: ordered. Radiology: ordered.  Risk Prescription drug management. Decision regarding  hospitalization.   Transient left arm numbness which may represent a transient ischemic attack.  Neurologic exam is nonfocal now except for questionable left central facial droop.  I have ordered the TIA order set.  No ongoing neurologic deficit, therefore not appropriate for the stroke protocol.  I have reviewed his laboratory test, and my interpretation is mild hypokalemia, mild hyponatremia, elevated random glucose level.  I have ordered a dose of oral potassium.  MRI of the brain shows no acute intracranial abnormality, but chronic cervical myelomalacia with associated malacia of the lower brainstem as well as changes of small vessel disease.  MRA of the head showed no abnormalities.  Have independently viewed all of the images, and agree with the radiologist's interpretation.  It is possible that his episode of numbness is related to the myelomalacia, but the transient nature of his symptoms are suggestive of TIA.  I have offered patient the option of completing TIA workup as an outpatient versus inpatient and he has elected for inpatient.  I have discussed case with Dr. Hanley Ben of [facility], who agrees to admit the patient.  I have also discussed the case with Dr. Amada Jupiter of neurology service who agrees to see the patient in consultation, requests loading dose of clopidogrel be given.  I have ordered a loading dose of clopidogrel 300 mg.  Final Clinical Impression(s) / ED Diagnoses Final diagnoses:  TIA (transient ischemic attack)  Hyponatremia  Hypokalemia  Normochromic normocytic anemia    Rx / DC Orders ED Discharge Orders     None         Dione Booze, MD 07/29/23 [phone]

## 2023-07-29 NOTE — Plan of Care (Signed)
  Problem: Education: Goal: Knowledge of disease or condition will improve Outcome: Progressing Goal: Knowledge of secondary prevention will improve (MUST DOCUMENT ALL) Outcome: Progressing   Problem: Ischemic Stroke/TIA Tissue Perfusion: Goal: Complications of ischemic stroke/TIA will be minimized Outcome: Progressing   Problem: Coping: Goal: Will verbalize positive feelings about self Outcome: Progressing Goal: Will identify appropriate support needs Outcome: Progressing   Problem: Health Behavior/Discharge Planning: Goal: Ability to manage health-related needs will improve Outcome: Progressing

## 2023-07-29 NOTE — ED Triage Notes (Signed)
Glick, MD at bedside.  

## 2023-07-29 NOTE — ED Triage Notes (Addendum)
Pt BIB [facility] from home for L arm numbness around midnight lasting about 1 hour. PTA. No current numbness. Pt states that he drank some water and the numbness went away. Stroke scale negative with EMS. Pt reports generalized weakness. Recently diagnosed with Multiple Myeloma. Pt receiving chemo/radiation.  154/100 HR 75 CBG 132

## 2023-07-29 NOTE — Evaluation (Addendum)
Physical Therapy Evaluation Patient Details Name: Derek [name] MRN: [id number] DOB: Dec 31, 1945 Today's Date: 07/29/2023  History of Present Illness  77 yo male presents to therapy following presentation to ED on 12/13 due to L UE numbness with a duration of one hr. Pt currently undergoing work up for TIA with MRI of brain negative for acute findings, however revealing chronic cervical and lower brainstem myelomalacia. Pt was found to have abn labs. Pt PMH Includes but is not limited to ca, GERD, fall, HLD,   L TKA, TLIF L4-l5, ACDF and periprosthetic ORIF of LLE on 12/05/2022.  Clinical Impression   Pt admitted with above diagnosis.  Pt currently with functional limitations due to the deficits listed below (see PT Problem List). PT arrived and pt agreeable to therapy intervention. Pt required max A for bed mobility, mod A for sit to stand  and min A for standing balance with B UE support for 1:22. Pt indicates his wife helps him with all functional mobility and ADLs at baseline. Pt reports not needing any further assist in home setting, PT strongly recommends continued inpatient follow up therapy, <3 hours/day to maximize safety and I in home setting. However, pt appears to be at baseline and will benefit from [facility] services.  Pt will benefit from acute skilled PT to increase their independence and safety with mobility to allow discharge.         If plan is discharge home, recommend the following: A little help with walking and/or transfers;A little help with bathing/dressing/bathroom;Assistance with cooking/housework;Assist for transportation;Help with stairs or ramp for entrance   Can travel by private vehicle        Equipment Recommendations None recommended by PT  Recommendations for Other Services       Functional Status Assessment Patient has had a recent decline in their functional status and demonstrates the ability to make significant improvements in function in a reasonable and  predictable amount of time.     Precautions / Restrictions Precautions Precautions: Fall Restrictions Weight Bearing Restrictions Per Provider Order: No      Mobility  Bed Mobility Overal bed mobility: Needs Assistance Bed Mobility: Supine to Sit, Sit to Supine     Supine to sit: Max assist, HOB elevated, Used rails Sit to supine: Max assist   General bed mobility comments: max A for supine <> sit with minimal initiation of movement with pt stating his wife helps him with this, pt required A for LEs and trunk and max A to reposition in bed with pt demonstrating L lateral lean    Transfers Overall transfer level: Needs assistance Equipment used: Rolling walker (2 wheels) Transfers: Sit to/from Stand Sit to Stand: Mod assist, From elevated surface           General transfer comment: pt required cues, pull to stand and mod A for power up, pt unable to achive upright extension posture and unable to  perform LE advacement with facilitation for weight shifitng and multimodal cues to complete SPT with RW.    Ambulation/Gait               General Gait Details: NT, pt reports non ambulatory since 11/2022 pt is self limiting LLE WB and is unable to perform pre gait tasks at EOB including side stepping  Stairs            Wheelchair Mobility     Tilt Bed    Modified Rankin (Stroke Patients Only)       Balance  Overall balance assessment: History of Falls, Needs assistance Sitting-balance support: Feet supported, Bilateral upper extremity supported Sitting balance-Leahy Scale: Fair     Standing balance support: Bilateral upper extremity supported, During functional activity, Reliant on assistive device for balance Standing balance-Leahy Scale: Zero Standing balance comment: min A for static standing balance with B UE support at RW                             Pertinent Vitals/Pain Pain Assessment Pain Assessment: 0-10    Home Living  Family/patient expects to be discharged to:: Private residence Living Arrangements: Spouse/significant other Available Help at Discharge: Friend(s);Available PRN/intermittently;Family;Personal care attendant (4 hrs a day 3 days a week) Type of Home: House Home Access: Ramped entrance       Home Layout: One level Home Equipment: Rollator (4 wheels);Shower seat;BSC/3in1;Grab bars - tub/shower;Hand held shower head;Wheelchair - IT trainer      Prior Function Prior Level of Function : Needs assist;Driving       Physical Assist : Mobility (physical);ADLs (physical) Mobility (physical): Transfers;Bed mobility;Gait ADLs (physical): Bathing;Dressing;Toileting;IADLs Mobility Comments: pt reports using motorized scooter for home and community navigation and has been non ambulatory sice 11/2022 following LLE fx and ORIF, pt states his wife helps him in and out of bed and swings him from the bed to the scooter and scooter to commode, pt sates that he is able to drive ADLs Comments: pt states that his wife helped him with all of his bathing, lower body dressing and IADLs.     Extremity/Trunk Assessment   Upper Extremity Assessment Upper Extremity Assessment:  (noted L hand flexion contracture with 4th and 5th rays and difficulty grasping RW with B UE)    Lower Extremity Assessment Lower Extremity Assessment: Generalized weakness (pt has B LE weakness and ROM deficits at baseline, pt is able to perform ankle DF/PF, B hip flexion, weakness noted with extensors and pt unable to maintain static standing without UE UE support and min A)    Cervical / Trunk Assessment Cervical / Trunk Assessment: Back Surgery;Neck Surgery  Communication   Communication Communication: No apparent difficulties  Cognition Arousal: Alert Behavior During Therapy: WFL for tasks assessed/performed Overall Cognitive Status: Within Functional Limits for tasks assessed                                  General Comments: pt noted to have some slight memory deficits with pt unable to recall L LE fx in April and then once in standing self limiting L LE WB and stating it has not been the same since fall and fx and his bone ca        General Comments General comments (skin integrity, edema, etc.): incont of bladder    Exercises     Assessment/Plan    PT Assessment Patient needs continued PT services  PT Problem List Decreased strength;Decreased range of motion;Decreased activity tolerance;Decreased balance;Decreased mobility;Decreased coordination;Decreased safety awareness;Decreased knowledge of precautions       PT Treatment Interventions DME instruction;Gait training;Functional mobility training;Therapeutic activities;Therapeutic exercise;Balance training;Neuromuscular re-education;Patient/family education    PT Goals (Current goals can be found in the Care Plan section)  Acute Rehab PT Goals Patient Stated Goal: go home PT Goal Formulation: With patient Time For Goal Achievement: 08/12/23 Potential to Achieve Goals: Good    Frequency Min 1X/week     Co-evaluation  AM-PAC PT "6 Clicks" Mobility  Outcome Measure Help needed turning from your back to your side while in a flat bed without using bedrails?: A Lot Help needed moving from lying on your back to sitting on the side of a flat bed without using bedrails?: A Lot Help needed moving to and from a bed to a chair (including a wheelchair)?: A Lot Help needed standing up from a chair using your arms (e.g., wheelchair or bedside chair)?: A Lot Help needed to walk in hospital room?: Total Help needed climbing 3-5 steps with a railing? : Total 6 Click Score: 10    End of Session Equipment Utilized During Treatment: Gait belt Activity Tolerance: Patient limited by fatigue Patient left: in bed;with call bell/phone within reach Nurse Communication: Mobility status PT Visit Diagnosis: Unsteadiness on  feet (R26.81);Other abnormalities of gait and mobility (R26.89);Muscle weakness (generalized) (M62.81);History of falling (Z91.81);Difficulty in walking, not elsewhere classified (R26.2)    Time: 5956-3875 PT Time Calculation (min) (ACUTE ONLY): 28 min   Charges:   PT Evaluation $PT Eval Low Complexity: 1 Low PT Treatments $Therapeutic Activity: 8-22 mins PT General Charges $$ ACUTE PT VISIT: 1 Visit         Johnny Bridge, PT Acute Rehab   Jacqualyn Posey 07/29/2023, 12:40 PM

## 2023-07-29 NOTE — Progress Notes (Signed)
SLP Cancellation Note  Patient Details Name: Derek [name] MRN: [id number] DOB: 1945-09-18   Cancelled evaluation: Pt passed stroke swallow screen, doing well with diet per nurse. No SLP swallow eval needed per protocol. Will sign off.  Alexxia Stankiewicz L. Samson Frederic, MA CCC/SLP Clinical Specialist - Acute Care SLP Acute Rehabilitation Services Office number [phone]            Blenda Mounts Laurice 07/29/2023, 9:09 AM

## 2023-07-30 ENCOUNTER — Observation Stay (HOSPITAL_BASED_OUTPATIENT_CLINIC_OR_DEPARTMENT_OTHER): Payer: No Typology Code available for payment source

## 2023-07-30 DIAGNOSIS — G459 Transient cerebral ischemic attack, unspecified: Secondary | ICD-10-CM

## 2023-07-30 LAB — ECHOCARDIOGRAM COMPLETE
AR max vel: 2.69 cm2
AV Area VTI: 2.78 cm2
AV Area mean vel: 2.59 cm2
AV Mean grad: 5 mm[Hg]
AV Peak grad: 10.4 mm[Hg]
Ao pk vel: 1.61 m/s
Area-P 1/2: 2.96 cm2
Height: 66 in
S' Lateral: 3.1 cm
Weight: 4077.63 [oz_av]

## 2023-07-30 LAB — COMPREHENSIVE METABOLIC PANEL
ALT: 19 U/L (ref 0–44)
AST: 13 U/L — ABNORMAL LOW (ref 15–41)
Albumin: 2.7 g/dL — ABNORMAL LOW (ref 3.5–5.0)
Alkaline Phosphatase: 55 U/L (ref 38–126)
Anion gap: 7 (ref 5–15)
BUN: 7 mg/dL — ABNORMAL LOW (ref 8–23)
CO2: 24 mmol/L (ref 22–32)
Calcium: 8.8 mg/dL — ABNORMAL LOW (ref 8.9–10.3)
Chloride: 105 mmol/L (ref 98–111)
Creatinine, Ser: 0.71 mg/dL (ref 0.61–1.24)
GFR, Estimated: >60 mL/min (ref >60)
Glucose, Bld: 102 mg/dL — ABNORMAL HIGH (ref 70–99)
Potassium: 3.6 mmol/L (ref 3.5–5.1)
Sodium: 136 mmol/L (ref 135–145)
Total Bilirubin: 0.6 mg/dL (ref <1.2)
Total Protein: 5.6 g/dL — ABNORMAL LOW (ref 6.5–8.1)

## 2023-07-30 LAB — CBC WITH DIFFERENTIAL/PLATELET
Abs Immature Granulocytes: 0.02 10*3/uL (ref 0.00–0.07)
Basophils Absolute: 0 10*3/uL (ref 0.0–0.1)
Basophils Relative: 0 %
Eosinophils Absolute: 0.1 10*3/uL (ref 0.0–0.5)
Eosinophils Relative: 2 %
HCT: 28.4 % — ABNORMAL LOW (ref 39.0–52.0)
Hemoglobin: 9.3 g/dL — ABNORMAL LOW (ref 13.0–17.0)
Immature Granulocytes: 0 %
Lymphocytes Relative: 27 %
Lymphs Abs: 1.5 10*3/uL (ref 0.7–4.0)
MCH: 33.9 pg (ref 26.0–34.0)
MCHC: 32.7 g/dL (ref 30.0–36.0)
MCV: 103.6 fL — ABNORMAL HIGH (ref 80.0–100.0)
Monocytes Absolute: 0.6 10*3/uL (ref 0.1–1.0)
Monocytes Relative: 11 %
Neutro Abs: 3.2 10*3/uL (ref 1.7–7.7)
Neutrophils Relative %: 60 %
Platelets: 172 10*3/uL (ref 150–400)
RBC: 2.74 MIL/uL — ABNORMAL LOW (ref 4.22–5.81)
RDW: 16.2 % — ABNORMAL HIGH (ref 11.5–15.5)
WBC: 5.4 10*3/uL (ref 4.0–10.5)
nRBC: 0 % (ref 0.0–0.2)

## 2023-07-30 LAB — MAGNESIUM: Magnesium: 2.3 mg/dL (ref 1.7–2.4)

## 2023-07-30 MED ORDER — CLOPIDOGREL BISULFATE 75 MG PO TABS: 75.0000 mg | ORAL_TABLET | Freq: Every day | ORAL | 0 refills | Status: DC

## 2023-07-30 MED ORDER — ASPIRIN 81 MG PO TBEC: 81.0000 mg | DELAYED_RELEASE_TABLET | Freq: Every day | ORAL | 1 refills | Status: DC

## 2023-07-30 MED ORDER — ATORVASTATIN CALCIUM 40 MG PO TABS: 40.0000 mg | ORAL_TABLET | Freq: Every day | ORAL | 1 refills | Status: DC

## 2023-07-30 NOTE — Evaluation (Signed)
Occupational Therapy Evaluation Patient Details Name: Derek [name] MRN: [id number] DOB: 02/19/1946 Today's Date: 07/30/2023   History of Present Illness 77 yo male presents to therapy following presentation to ED on 12/13 due to L UE numbness with a duration of one hr. Pt currently undergoing work up for TIA with MRI of brain negative for acute findings, however revealing chronic cervical and lower brainstem myelomalacia. Pt was found to have abn labs. Pt PMH Includes but is not limited to ca, GERD, fall, HLD,   L TKA, TLIF L4-l5, ACDF and periprosthetic ORIF of LLE on 12/05/2022.   Clinical Impression   Pt admitted with the above. Pt currently with functional limitations due to the deficits listed below (see OT Problem List).  Pt will benefit from acute skilled OT to increase their safety and independence with ADL and functional mobility for ADL to facilitate discharge.     Plan is to go home with wife but concerns with amount of care pt may need - pt is not concerned       If plan is discharge home, recommend the following: A lot of help with walking and/or transfers;A lot of help with bathing/dressing/bathroom;Two people to help with walking and/or transfers;Two people to help with bathing/dressing/bathroom    Functional Status Assessment  Patient has had a recent decline in their functional status and demonstrates the ability to make significant improvements in function in a reasonable and predictable amount of time.  Equipment Recommendations  None recommended by OT       Precautions / Restrictions Precautions Precautions: Fall Restrictions Weight Bearing Restrictions Per Provider Order: No      Mobility Bed Mobility Overal bed mobility: Needs Assistance Bed Mobility: Supine to Sit, Sit to Supine     Supine to sit: Max assist, HOB elevated, Used rails Sit to supine: Max assist   General bed mobility comments: max A for supine <> sit with minimal initiation of movement  with pt stating his wife helps him with this, pt required A for LEs and trunk and max A to reposition in bed with pt demonstrating L lateral lean    Transfers                   General transfer comment: declined standing or transfer      Balance Overall balance assessment: History of Falls, Needs assistance Sitting-balance support: Feet supported, Bilateral upper extremity supported Sitting balance-Leahy Scale: Fair         Standing balance comment: min A for static standing balance with B UE support at RW                           ADL either performed or assessed with clinical judgement   ADL Overall ADL's : Needs assistance/impaired Eating/Feeding: Set up;Sitting   Grooming: Minimal assistance;Sitting   Upper Body Bathing: Moderate assistance;Sitting   Lower Body Bathing: Maximal assistance;Sitting/lateral leans   Upper Body Dressing : Moderate assistance;Sitting   Lower Body Dressing: Sitting/lateral leans;Maximal assistance                 General ADL Comments: Pt declined to stand with OT despite encouragement. Pt very focused on waiting on wife and states he will not do this until she comes.  Pt stated they had it all figured out all out at home and he was not worried. Pt will need significant a with all ADL activity at home     Vision  Patient Visual Report: No change from baseline              Pertinent Vitals/Pain Pain Assessment Pain Assessment: No/denies pain     Extremity/Trunk Assessment Upper Extremity Assessment Upper Extremity Assessment: Generalized weakness           Communication Communication Communication: No apparent difficulties   Cognition Arousal: Alert Behavior During Therapy: WFL for tasks assessed/performed Overall Cognitive Status: Within Functional Limits for tasks assessed                                 General Comments: pt noted to have some slight memory deficits with pt unable to  recall L LE fx in April and then once in standing self limiting L LE WB and stating it has not been the same since fall and fx and his bone ca                Home Living Family/patient expects to be discharged to:: Private residence Living Arrangements: Spouse/significant other Available Help at Discharge: Friend(s);Available PRN/intermittently;Family;Personal care attendant (4 hrs a day 3 days a week) Type of Home: House Home Access: Ramped entrance     Home Layout: One level     Bathroom Shower/Tub: Chief Strategy Officer: Standard Bathroom Accessibility: Yes   Home Equipment: Rollator (4 wheels);Shower seat;BSC/3in1;Grab bars - tub/shower;Hand held shower head;Wheelchair - IT trainer          Prior Functioning/Environment Prior Level of Function : Needs assist;Driving       Physical Assist : Mobility (physical);ADLs (physical) Mobility (physical): Transfers;Bed mobility;Gait ADLs (physical): Bathing;Dressing;Toileting;IADLs Mobility Comments: pt reports using motorized scooter for home and community navigation and has been non ambulatory sice 11/2022 following LLE fx and ORIF, pt states his wife helps him in and out of bed and swings him from the bed to the scooter and scooter to commode, pt sates that he is able to drive ADLs Comments: pt states that his wife helped him with all of his bathing, lower body dressing and IADLs.        OT Problem List: Decreased strength;Decreased activity tolerance;Obesity;Decreased safety awareness;Impaired balance (sitting and/or standing)      OT Treatment/Interventions: Self-care/ADL training;Patient/family education    OT Goals(Current goals can be found in the care plan section) Acute Rehab OT Goals Patient Stated Goal: go home today OT Goal Formulation: With patient Time For Goal Achievement: 08/13/23 Potential to Achieve Goals: Fair  OT Frequency: Min 2X/week       AM-PAC OT "6 Clicks" Daily  Activity     Outcome Measure Help from another person eating meals?: A Little Help from another person taking care of personal grooming?: A Little Help from another person toileting, which includes using toliet, bedpan, or urinal?: Total Help from another person bathing (including washing, rinsing, drying)?: A Lot Help from another person to put on and taking off regular upper body clothing?: A Lot Help from another person to put on and taking off regular lower body clothing?: Total 6 Click Score: 12   End of Session Nurse Communication: Mobility status  Activity Tolerance: Patient tolerated treatment well Patient left: in bed;with call bell/phone within reach;with bed alarm set  OT Visit Diagnosis: Unsteadiness on feet (R26.81);History of falling (Z91.81);Other abnormalities of gait and mobility (R26.89);Repeated falls (R29.6);Muscle weakness (generalized) (M62.81)                Time:  2831-5176 OT Time Calculation (min): 16 min Charges:  OT General Charges $OT Visit: 1 Visit OT Evaluation $OT Eval Moderate Complexity: 1 Mod    Elke Holtry, [place] Kung 07/30/2023, 5:27 PM

## 2023-07-30 NOTE — Progress Notes (Signed)
  Echocardiogram 2D Echocardiogram has been performed.  Casimiro Needle P Shawanna Zanders 07/30/2023, 9:15 AM

## 2023-07-30 NOTE — Discharge Summary (Signed)
Physician Discharge Summary  Derek [name] ONG:[id number] DOB: 1945-08-25 DOA: 07/29/2023  PCP: [facility]  Admit date: 07/29/2023 Discharge date: 07/30/2023  Admitted From: Home Disposition: Home  Recommendations for Outpatient Follow-up:  Follow up with PCP in 1 week with repeat CBC/BMP Outpatient follow-up with neurology Follow up in ED if symptoms worsen or new appear   Home Health: No Equipment/Devices: None  Discharge Condition: Stable CODE STATUS: Full Diet recommendation: Heart healthy  Brief/Interim Summary: 77 y.o. male with medical history significant of GERD, hyperlipidemia, obesity and recent diagnosis of multiple myeloma currently on chemotherapy presented with left arm weakness and numbness that started around 12:30 AM of the morning of presentation and lasted for around an hour.  On presentation, MRI of brain and MRA of brain were negative for acute process.  He has been started on aspirin, Plavix and Lipitor as per neurology recommendations.  2D echo has been done.  He is mostly wheelchair-bound at baseline and not interested in rehab services.  Neurology has cleared the patient for discharge.  His symptoms have resolved.  He will be discharged home on aspirin, Plavix and Lipitor.  Outpatient follow-up with PCP and neurology.  Discharge Diagnoses:   Probable TIA presenting with left-sided weakness and numbness Hyperlipidemia -Symptoms have resolved.  MRI/MRI of brain negative.  Carotid ultrasound was negative for any significant stenosis - A1c 4.8.  LDL 84. -Currently tolerating diet.  Patient is mostly wheelchair-bound at home  and not interested in rehab services.  -Neurology recommended aspirin, Plavix for 3 weeks then aspirin alone.  Neurology also recommending Lipitor on discharge.  Neurology has cleared the patient for discharge if echocardiogram looks okay.   -He will be discharged home today once echocardiogram gets done and if it looks okay.   Outpatient follow-up with PCP and neurology.    Hypokalemia -Resolved   Hyponatremia -Resolved  Multiple myeloma -Recent diagnosis.  Currently on chemotherapy as an outpatient by oncology at [facility]  Anemia of chronic disease Macrocytosis -Possibly from myeloma and chemotherapy.  Outpatient follow-up intermittently of hemoglobin   Morbid obesity/class III obesity -Outpatient follow-up   Physical deconditioning -Patient has been mostly wheelchair-bound recently at home  and not interested in rehab services. Outpatient follow-up with PCP   Discharge Instructions   Allergies as of 07/30/2023   No Known Allergies      Medication List     TAKE these medications    aspirin EC 81 MG tablet Take 1 tablet (81 mg total) by mouth daily. Swallow whole.   atorvastatin 40 MG tablet Commonly known as: LIPITOR Take 1 tablet (40 mg total) by mouth at bedtime.   Butenafine HCl 1 % cream Apply 30 g topically 2 (two) times daily.   cetirizine 10 MG tablet Commonly known as: ZYRTEC Take 10 mg by mouth daily.   cholecalciferol 1000 units tablet Commonly known as: VITAMIN D Take 1,000 Units by mouth daily.   clopidogrel 75 MG tablet Commonly known as: PLAVIX Take 1 tablet (75 mg total) by mouth daily.   nystatin cream Commonly known as: MYCOSTATIN Apply 1 Application topically 2 (two) times daily.   PROSTATE PO Take 1 tablet by mouth 2 (two) times daily.   trolamine salicylate 10 % cream Commonly known as: ASPERCREME Apply 1 application topically as needed for muscle pain.        No Known Allergies  Consultations: Neurology   Procedures/Studies: VAS US CAROTID Result Date: 07/29/2023 Carotid Arterial Duplex Study Patient Name:  Derek [name]  Date of Exam:   07/29/2023 Medical Rec #: [id number]       Accession #:    [id number] Date of Birth: [birth year]        Patient Gender: M Patient Age:   88 years Exam Location:  [facility] Procedure:      VAS US  CAROTID Referring Phys: Angelique Blonder WOLFE --------------------------------------------------------------------------------  Indications:       TIA, Numbness and Weakness. Risk Factors:      Hyperlipidemia. Comparison Study:  No prior exam. Performing Technologist: Fernande Bras  Examination Guidelines: A complete evaluation includes B-mode imaging, spectral Doppler, color Doppler, and power Doppler as needed of all accessible portions of each vessel. Bilateral testing is considered an integral part of a complete examination. Limited examinations for reoccurring indications may be performed as noted.  Right Carotid Findings: +----------+--------+--------+--------+-------------------------+--------+           PSV cm/sEDV cm/sStenosisPlaque Description       Comments +----------+--------+--------+--------+-------------------------+--------+ CCA Prox  178     21                                                +----------+--------+--------+--------+-------------------------+--------+ CCA Distal127     20                                                +----------+--------+--------+--------+-------------------------+--------+ ICA Prox  133     25              heterogenous and calcific         +----------+--------+--------+--------+-------------------------+--------+ ICA Mid   107     26                                                +----------+--------+--------+--------+-------------------------+--------+ ICA Distal63      18                                                +----------+--------+--------+--------+-------------------------+--------+ ECA       125     12                                                +----------+--------+--------+--------+-------------------------+--------+ +----------+--------+-------+--------+-------------------+           PSV cm/sEDV cmsDescribeArm Pressure (mmHG) +----------+--------+-------+--------+-------------------+  Subclavian218     28                                 +----------+--------+-------+--------+-------------------+ +---------+--------+--+--------+--+ VertebralPSV cm/s60EDV cm/s19 +---------+--------+--+--------+--+ Pulsatile vessel Left Carotid Findings: +----------+--------+--------+--------+-----------------------+--------+           PSV cm/sEDV cm/sStenosisPlaque Description     Comments +----------+--------+--------+--------+-----------------------+--------+ CCA Prox  139     24                                              +----------+--------+--------+--------+-----------------------+--------+  CCA Distal127     22                                              +----------+--------+--------+--------+-----------------------+--------+ ICA Prox  63      10              heterogenous and smooth         +----------+--------+--------+--------+-----------------------+--------+ ICA Mid   96      30                                              +----------+--------+--------+--------+-----------------------+--------+ ICA Distal97      24                                              +----------+--------+--------+--------+-----------------------+--------+ ECA       138     16                                              +----------+--------+--------+--------+-----------------------+--------+ +----------+--------+--------+--------+-------------------+           PSV cm/sEDV cm/sDescribeArm Pressure (mmHG) +----------+--------+--------+--------+-------------------+ Subclavian140                                         +----------+--------+--------+--------+-------------------+ +---------+--------+--+--------+--+ VertebralPSV cm/s67EDV cm/s20 +---------+--------+--+--------+--+ Pulsatile vessel  Summary: Right Carotid: Velocities in the right ICA are consistent with a 1-39% stenosis.                The ECA appears <50% stenosed. Left Carotid: Velocities  in the left ICA are consistent with a 1-39% stenosis.               The ECA appears <50% stenosed. Vertebrals:  Bilateral vertebral arteries demonstrate antegrade flow. Subclavians: Normal flow hemodynamics were seen in bilateral subclavian              arteries. *See table(s) above for measurements and observations.     Preliminary    MR ANGIO HEAD WO CONTRAST Result Date: 07/29/2023 CLINICAL DATA:  77 year old male with multiple myeloma. Left arm numbness, generalized weakness, undergoing cancer treatment. EXAM: MRA HEAD WITHOUT CONTRAST TECHNIQUE: Angiographic images of the Circle of Willis were acquired using MRA technique without intravenous contrast. COMPARISON:  Brain MRI today. FINDINGS: Anterior circulation: Antegrade flow in both ICA siphons. Partially visible cervical right ICA tortuosity in the neck. No evidence of siphon stenosis. Ophthalmic and posterior communicating artery origins appear normal. Patent carotid termini. MCA and ACA origins appear normal. Anterior communicating artery and visible ACA branches are within normal limits. MCA M1 segments and visible bilateral MCA branches are within normal limits. Posterior circulation: Antegrade flow in the posterior circulation with codominant distal vertebral arteries. PICA origins appear patent and normal. Distal vertebral arteries, vertebrobasilar junction and basilar artery appear patent without stenosis. SCA, PCA origins and bilateral posterior communicating arteries appear normal. Bilateral PCA branches are within normal limits. Anatomic  variants: None. Other: Brain MRI today reported separately. IMPRESSION: Negative intracranial MRA. Brain MRI today reported separately. Electronically Signed   By: Odessa Fleming M.D.   On: 07/29/2023 06:30   MR BRAIN WO CONTRAST Result Date: 07/29/2023 CLINICAL DATA:  77 year old male with multiple myeloma. Left arm numbness, generalized weakness, undergoing cancer treatment. EXAM: MRI HEAD WITHOUT CONTRAST  TECHNIQUE: Multiplanar, multiecho pulse sequences of the brain and surrounding structures were obtained without intravenous contrast. COMPARISON:  PET-CT 04/14/2023. Cervical spine MRI 04/21/2021. Intracranial MRA today reported separately. FINDINGS: Brain: No restricted diffusion to suggest acute infarction. No midline shift, mass effect, evidence of mass lesion, ventriculomegaly, extra-axial collection or acute intracranial hemorrhage. Pituitary is normal. Abnormally tapered appearance of the medulla, abnormal cervicomedullary junction which appears atrophic, and the visible upper cervical spinal cord appears highly atrophic and diminutive (series 8, image 1 and series 7, image 12). Similar appearance on the 2022 cervical spine MRI. Mild to moderate patchy generalized T2 and FLAIR hyperintensity in the pons, but pontine and mid brain volume appears relatively maintained. Similar T2 heterogeneity in the bilateral deep gray nuclei, more pronounced in the right basal ganglia. Similar deep white matter signal changes bilaterally. And scattered periventricular and subcortical white matter changes, mild to moderate for age. No supratentorial cortical encephalomalacia. No chronic cerebral blood products on SWI. No contrast administered. Vascular: Major intracranial vascular flow voids are preserved. Skull and upper cervical spine: Artifact from new cervical ACDF since the 2022 MRI. Visible bone marrow signal is within normal limits. Abnormal cervical spinal cord as above. Sinuses/Orbits: Chronic left lamina papyracea fracture, chronic left frontal sinus and ethmoid sinus disease appears stable from the August PET-CT Orbits soft tissues are within normal limits. Other: Mild left mastoid effusion, stable or improved since August. Grossly normal other visible internal auditory structures. Negative visible scalp and face. IMPRESSION: 1. No acute intracranial abnormality on this noncontrast exam. 2. For pronounced chronic  cervical spinal cord myelomalacia, with associated malacia of the lower brainstem also. Superimposed moderate signal changes in the deep gray nuclei, pons, and scattered in the bilateral cerebral white matter which are nonspecific but most commonly chronic small vessel disease related. 3. Evidence of cervical spine ACDF since 2022. 4. Intracranial MRA reported separately. Electronically Signed   By: Odessa Fleming M.D.   On: 07/29/2023 06:27      Subjective: Patient seen and examined at bedside.  Symptoms have resolved.  Feels okay to go home today.  No fever, vomiting, chest pain or shortness of breath reported.  Discharge Exam: Vitals:   07/30/23 0032 07/30/23 0534  BP: (!) 141/71 (!) 158/65  Pulse: 78 84  Resp: 20 16  Temp: 97.7 F (36.5 C) 98.2 F (36.8 C)  SpO2: 96% 98%    General: Pt is alert, awake, not in acute distress.  Looks chronically ill and deconditioned.  On room air. Cardiovascular: rate controlled, S1/S2 + Respiratory: bilateral decreased breath sounds at bases Abdominal: Soft, morbidly obese, NT, ND, bowel sounds + Extremities: Bilateral lower extremity edema present; no cyanosis    The results of significant diagnostics from this hospitalization (including imaging, microbiology, ancillary and laboratory) are listed below for reference.     Microbiology: No results found for this or any previous visit (from the past 240 hours).   Labs: BNP (last 3 results) No results for input(s): "BNP" in the last 8760 hours. Basic Metabolic Panel: Recent Labs  Lab 07/29/23 0503 07/30/23 0544  NA 133* 136  K 3.4* 3.6  CL 99 105  CO2 25 24  GLUCOSE 113* 102*  BUN 10 7*  CREATININE 0.69 0.71  CALCIUM 8.8* 8.8*  MG  --  2.3   Liver Function Tests: Recent Labs  Lab 07/29/23 0503 07/30/23 0544  AST 13* 13*  ALT 22 19  ALKPHOS 60 55  BILITOT 0.6 0.6  PROT 5.9* 5.6*  ALBUMIN 3.2* 2.7*   No results for input(s): "LIPASE", "AMYLASE" in the last 168 hours. No  results for input(s): "AMMONIA" in the last 168 hours. CBC: Recent Labs  Lab 07/30/23 0544  WBC 5.4  NEUTROABS 3.2  HGB 9.3*  HCT 28.4*  MCV 103.6*  PLT 172   Cardiac Enzymes: No results for input(s): "CKTOTAL", "CKMB", "CKMBINDEX", "TROPONINI" in the last 168 hours. BNP: Invalid input(s): "POCBNP" CBG: No results for input(s): "GLUCAP" in the last 168 hours. D-Dimer No results for input(s): "DDIMER" in the last 72 hours. Hgb A1c Recent Labs    07/29/23 0503  HGBA1C 4.8   Lipid Profile Recent Labs    07/29/23 0503  CHOL 149  HDL 51  LDLCALC 84  TRIG 71  CHOLHDL 2.9   Thyroid function studies No results for input(s): "TSH", "T4TOTAL", "T3FREE", "THYROIDAB" in the last 72 hours.  Invalid input(s): "FREET3" Anemia work up No results for input(s): "VITAMINB12", "FOLATE", "FERRITIN", "TIBC", "IRON", "RETICCTPCT" in the last 72 hours. Urinalysis    Component Value Date/Time   COLORURINE STRAW (A) 07/29/2023 1026   APPEARANCEUR CLEAR 07/29/2023 1026   APPEARANCEUR Clear 02/22/2022 1458   LABSPEC 1.003 (L) 07/29/2023 1026   PHURINE 7.0 07/29/2023 1026   GLUCOSEU NEGATIVE 07/29/2023 1026   HGBUR NEGATIVE 07/29/2023 1026   BILIRUBINUR NEGATIVE 07/29/2023 1026   BILIRUBINUR Negative 02/22/2022 1458   KETONESUR NEGATIVE 07/29/2023 1026   PROTEINUR NEGATIVE 07/29/2023 1026   UROBILINOGEN negative 05/17/2014 1043   UROBILINOGEN 0.2 04/19/2014 2103   NITRITE NEGATIVE 07/29/2023 1026   LEUKOCYTESUR NEGATIVE 07/29/2023 1026   Sepsis Labs Recent Labs  Lab 07/30/23 0544  WBC 5.4   Microbiology No results found for this or any previous visit (from the past 240 hours).   Time coordinating discharge: 35 minutes  SIGNED:   Glade Lloyd, MD  [facility] 07/30/2023, 10:52 AM

## 2023-08-15 ENCOUNTER — Ambulatory Visit (HOSPITAL_COMMUNITY): Payer: Medicare HMO | Attending: Vascular Surgery

## 2023-09-07 ENCOUNTER — Ambulatory Visit: Payer: Medicare HMO | Admitting: Neurology

## 2023-09-26 ENCOUNTER — Emergency Department (HOSPITAL_COMMUNITY): Payer: No Typology Code available for payment source

## 2023-09-26 ENCOUNTER — Inpatient Hospital Stay (HOSPITAL_COMMUNITY)
Admission: EM | Admit: 2023-09-26 | Discharge: 2023-10-17 | DRG: 853 | Disposition: A | Discharge status: Other Acute Inpatient Hospital | Payer: Non-veteran care | Attending: Family Medicine | Admitting: Family Medicine

## 2023-09-26 ENCOUNTER — Other Ambulatory Visit: Payer: Self-pay

## 2023-09-26 DIAGNOSIS — I82412 Acute embolism and thrombosis of left femoral vein: Secondary | ICD-10-CM | POA: Diagnosis present

## 2023-09-26 DIAGNOSIS — R590 Localized enlarged lymph nodes: Secondary | ICD-10-CM | POA: Diagnosis present

## 2023-09-26 DIAGNOSIS — E8721 Acute metabolic acidosis: Secondary | ICD-10-CM | POA: Diagnosis present

## 2023-09-26 DIAGNOSIS — K219 Gastro-esophageal reflux disease without esophagitis: Secondary | ICD-10-CM | POA: Diagnosis not present

## 2023-09-26 DIAGNOSIS — E875 Hyperkalemia: Secondary | ICD-10-CM | POA: Diagnosis present

## 2023-09-26 DIAGNOSIS — F05 Delirium due to known physiological condition: Secondary | ICD-10-CM | POA: Diagnosis not present

## 2023-09-26 DIAGNOSIS — N493 Fournier gangrene: Secondary | ICD-10-CM | POA: Diagnosis not present

## 2023-09-26 DIAGNOSIS — J690 Pneumonitis due to inhalation of food and vomit: Secondary | ICD-10-CM | POA: Diagnosis not present

## 2023-09-26 DIAGNOSIS — I82402 Acute embolism and thrombosis of unspecified deep veins of left lower extremity: Secondary | ICD-10-CM | POA: Diagnosis not present

## 2023-09-26 DIAGNOSIS — L8942 Pressure ulcer of contiguous site of back, buttock and hip, stage 2: Secondary | ICD-10-CM

## 2023-09-26 DIAGNOSIS — E860 Dehydration: Secondary | ICD-10-CM | POA: Diagnosis present

## 2023-09-26 DIAGNOSIS — R630 Anorexia: Secondary | ICD-10-CM

## 2023-09-26 DIAGNOSIS — D696 Thrombocytopenia, unspecified: Secondary | ICD-10-CM | POA: Diagnosis not present

## 2023-09-26 DIAGNOSIS — I82409 Acute embolism and thrombosis of unspecified deep veins of unspecified lower extremity: Secondary | ICD-10-CM | POA: Insufficient documentation

## 2023-09-26 DIAGNOSIS — Z993 Dependence on wheelchair: Secondary | ICD-10-CM

## 2023-09-26 DIAGNOSIS — M7989 Other specified soft tissue disorders: Secondary | ICD-10-CM | POA: Diagnosis not present

## 2023-09-26 DIAGNOSIS — Z833 Family history of diabetes mellitus: Secondary | ICD-10-CM

## 2023-09-26 DIAGNOSIS — G9341 Metabolic encephalopathy: Secondary | ICD-10-CM | POA: Diagnosis present

## 2023-09-26 DIAGNOSIS — M4628 Osteomyelitis of vertebra, sacral and sacrococcygeal region: Secondary | ICD-10-CM | POA: Diagnosis present

## 2023-09-26 DIAGNOSIS — Z7401 Bed confinement status: Secondary | ICD-10-CM

## 2023-09-26 DIAGNOSIS — R059 Cough, unspecified: Secondary | ICD-10-CM | POA: Diagnosis not present

## 2023-09-26 DIAGNOSIS — R739 Hyperglycemia, unspecified: Secondary | ICD-10-CM | POA: Diagnosis not present

## 2023-09-26 DIAGNOSIS — Z604 Social exclusion and rejection: Secondary | ICD-10-CM | POA: Diagnosis present

## 2023-09-26 DIAGNOSIS — Z981 Arthrodesis status: Secondary | ICD-10-CM

## 2023-09-26 DIAGNOSIS — D62 Acute posthemorrhagic anemia: Secondary | ICD-10-CM | POA: Diagnosis not present

## 2023-09-26 DIAGNOSIS — L89222 Pressure ulcer of left hip, stage 2: Secondary | ICD-10-CM

## 2023-09-26 DIAGNOSIS — Z7189 Other specified counseling: Secondary | ICD-10-CM | POA: Diagnosis not present

## 2023-09-26 DIAGNOSIS — Z515 Encounter for palliative care: Secondary | ICD-10-CM | POA: Diagnosis not present

## 2023-09-26 DIAGNOSIS — L89320 Pressure ulcer of left buttock, unstageable: Secondary | ICD-10-CM | POA: Diagnosis present

## 2023-09-26 DIAGNOSIS — Z6841 Body Mass Index (BMI) 40.0 and over, adult: Secondary | ICD-10-CM

## 2023-09-26 DIAGNOSIS — L89310 Pressure ulcer of right buttock, unstageable: Secondary | ICD-10-CM | POA: Diagnosis present

## 2023-09-26 DIAGNOSIS — N179 Acute kidney failure, unspecified: Secondary | ICD-10-CM | POA: Diagnosis not present

## 2023-09-26 DIAGNOSIS — E876 Hypokalemia: Secondary | ICD-10-CM | POA: Diagnosis not present

## 2023-09-26 DIAGNOSIS — I82492 Acute embolism and thrombosis of other specified deep vein of left lower extremity: Secondary | ICD-10-CM | POA: Diagnosis not present

## 2023-09-26 DIAGNOSIS — R4589 Other symptoms and signs involving emotional state: Secondary | ICD-10-CM

## 2023-09-26 DIAGNOSIS — J90 Pleural effusion, not elsewhere classified: Secondary | ICD-10-CM | POA: Diagnosis not present

## 2023-09-26 DIAGNOSIS — M726 Necrotizing fasciitis: Secondary | ICD-10-CM | POA: Diagnosis present

## 2023-09-26 DIAGNOSIS — R627 Adult failure to thrive: Secondary | ICD-10-CM | POA: Diagnosis present

## 2023-09-26 DIAGNOSIS — R600 Localized edema: Secondary | ICD-10-CM | POA: Diagnosis not present

## 2023-09-26 DIAGNOSIS — R410 Disorientation, unspecified: Secondary | ICD-10-CM | POA: Insufficient documentation

## 2023-09-26 DIAGNOSIS — B9620 Unspecified Escherichia coli [E. coli] as the cause of diseases classified elsewhere: Secondary | ICD-10-CM | POA: Diagnosis present

## 2023-09-26 DIAGNOSIS — N3001 Acute cystitis with hematuria: Secondary | ICD-10-CM | POA: Diagnosis not present

## 2023-09-26 DIAGNOSIS — I4891 Unspecified atrial fibrillation: Secondary | ICD-10-CM | POA: Diagnosis not present

## 2023-09-26 DIAGNOSIS — R579 Shock, unspecified: Secondary | ICD-10-CM | POA: Diagnosis not present

## 2023-09-26 DIAGNOSIS — N170 Acute kidney failure with tubular necrosis: Secondary | ICD-10-CM | POA: Diagnosis present

## 2023-09-26 DIAGNOSIS — D638 Anemia in other chronic diseases classified elsewhere: Secondary | ICD-10-CM | POA: Diagnosis present

## 2023-09-26 DIAGNOSIS — F17200 Nicotine dependence, unspecified, uncomplicated: Secondary | ICD-10-CM | POA: Diagnosis present

## 2023-09-26 DIAGNOSIS — K660 Peritoneal adhesions (postprocedural) (postinfection): Secondary | ICD-10-CM | POA: Diagnosis present

## 2023-09-26 DIAGNOSIS — E872 Acidosis, unspecified: Secondary | ICD-10-CM | POA: Diagnosis not present

## 2023-09-26 DIAGNOSIS — R6521 Severe sepsis with septic shock: Secondary | ICD-10-CM | POA: Diagnosis present

## 2023-09-26 DIAGNOSIS — Z9185 Personal history of military service: Secondary | ICD-10-CM

## 2023-09-26 DIAGNOSIS — A419 Sepsis, unspecified organism: Principal | ICD-10-CM | POA: Diagnosis present

## 2023-09-26 DIAGNOSIS — E871 Hypo-osmolality and hyponatremia: Secondary | ICD-10-CM | POA: Diagnosis present

## 2023-09-26 DIAGNOSIS — B952 Enterococcus as the cause of diseases classified elsewhere: Secondary | ICD-10-CM | POA: Diagnosis present

## 2023-09-26 DIAGNOSIS — N390 Urinary tract infection, site not specified: Secondary | ICD-10-CM | POA: Diagnosis not present

## 2023-09-26 DIAGNOSIS — E861 Hypovolemia: Secondary | ICD-10-CM | POA: Diagnosis not present

## 2023-09-26 DIAGNOSIS — L89150 Pressure ulcer of sacral region, unstageable: Secondary | ICD-10-CM | POA: Diagnosis present

## 2023-09-26 DIAGNOSIS — L89159 Pressure ulcer of sacral region, unspecified stage: Secondary | ICD-10-CM | POA: Diagnosis not present

## 2023-09-26 DIAGNOSIS — E8729 Other acidosis: Secondary | ICD-10-CM | POA: Diagnosis not present

## 2023-09-26 DIAGNOSIS — I872 Venous insufficiency (chronic) (peripheral): Secondary | ICD-10-CM | POA: Diagnosis present

## 2023-09-26 DIAGNOSIS — D649 Anemia, unspecified: Secondary | ICD-10-CM | POA: Diagnosis not present

## 2023-09-26 DIAGNOSIS — L89626 Pressure-induced deep tissue damage of left heel: Secondary | ICD-10-CM | POA: Diagnosis present

## 2023-09-26 DIAGNOSIS — B965 Pseudomonas (aeruginosa) (mallei) (pseudomallei) as the cause of diseases classified elsewhere: Secondary | ICD-10-CM | POA: Diagnosis present

## 2023-09-26 DIAGNOSIS — L89892 Pressure ulcer of other site, stage 2: Secondary | ICD-10-CM | POA: Diagnosis present

## 2023-09-26 DIAGNOSIS — E43 Unspecified severe protein-calorie malnutrition: Secondary | ICD-10-CM | POA: Diagnosis not present

## 2023-09-26 DIAGNOSIS — Z7982 Long term (current) use of aspirin: Secondary | ICD-10-CM

## 2023-09-26 DIAGNOSIS — Z96652 Presence of left artificial knee joint: Secondary | ICD-10-CM | POA: Diagnosis present

## 2023-09-26 DIAGNOSIS — C9000 Multiple myeloma not having achieved remission: Secondary | ICD-10-CM | POA: Diagnosis present

## 2023-09-26 DIAGNOSIS — E8809 Other disorders of plasma-protein metabolism, not elsewhere classified: Secondary | ICD-10-CM | POA: Diagnosis not present

## 2023-09-26 DIAGNOSIS — Z7960 Long term (current) use of unspecified immunomodulators and immunosuppressants: Secondary | ICD-10-CM

## 2023-09-26 DIAGNOSIS — E66813 Obesity, class 3: Secondary | ICD-10-CM | POA: Diagnosis present

## 2023-09-26 DIAGNOSIS — Z8673 Personal history of transient ischemic attack (TIA), and cerebral infarction without residual deficits: Secondary | ICD-10-CM

## 2023-09-26 DIAGNOSIS — Z79899 Other long term (current) drug therapy: Secondary | ICD-10-CM

## 2023-09-26 DIAGNOSIS — R609 Edema, unspecified: Secondary | ICD-10-CM | POA: Diagnosis not present

## 2023-09-26 DIAGNOSIS — E8770 Fluid overload, unspecified: Secondary | ICD-10-CM | POA: Diagnosis present

## 2023-09-26 DIAGNOSIS — E7800 Pure hypercholesterolemia, unspecified: Secondary | ICD-10-CM | POA: Diagnosis present

## 2023-09-26 DIAGNOSIS — Z602 Problems related to living alone: Secondary | ICD-10-CM | POA: Diagnosis present

## 2023-09-26 LAB — CBC WITH DIFFERENTIAL/PLATELET
Abs Immature Granulocytes: 0.38 10*3/uL — ABNORMAL HIGH (ref 0.00–0.07)
Basophils Absolute: 0 10*3/uL (ref 0.0–0.1)
Basophils Relative: 0 %
Eosinophils Absolute: 0 10*3/uL (ref 0.0–0.5)
Eosinophils Relative: 0 %
HCT: 28.3 % — ABNORMAL LOW (ref 39.0–52.0)
Hemoglobin: 9.1 g/dL — ABNORMAL LOW (ref 13.0–17.0)
Immature Granulocytes: 5 %
Lymphocytes Relative: 12 %
Lymphs Abs: 1 10*3/uL (ref 0.7–4.0)
MCH: 30.7 pg (ref 26.0–34.0)
MCHC: 32.2 g/dL (ref 30.0–36.0)
MCV: 95.6 fL (ref 80.0–100.0)
Monocytes Absolute: 0.4 10*3/uL (ref 0.1–1.0)
Monocytes Relative: 5 %
Neutro Abs: 6.7 10*3/uL (ref 1.7–7.7)
Neutrophils Relative %: 78 %
Platelets: 293 10*3/uL (ref 150–400)
RBC: 2.96 MIL/uL — ABNORMAL LOW (ref 4.22–5.81)
RDW: 15.6 % — ABNORMAL HIGH (ref 11.5–15.5)
WBC: 8.5 10*3/uL (ref 4.0–10.5)
nRBC: 0 % (ref 0.0–0.2)

## 2023-09-26 LAB — URINALYSIS, ROUTINE W REFLEX MICROSCOPIC
Bilirubin Urine: NEGATIVE
Glucose, UA: NEGATIVE mg/dL
Ketones, ur: NEGATIVE mg/dL
Nitrite: NEGATIVE
Protein, ur: 30 mg/dL — AB
RBC / HPF: >50 RBC/hpf (ref 0–5)
Specific Gravity, Urine: 1.02 (ref 1.005–1.030)
WBC, UA: >50 WBC/hpf (ref 0–5)
pH: 5 (ref 5.0–8.0)

## 2023-09-26 LAB — I-STAT CG4 LACTIC ACID, ED: Lactic Acid, Venous: 3 mmol/L (ref 0.5–1.9)

## 2023-09-26 LAB — COMPREHENSIVE METABOLIC PANEL
ALT: 50 U/L — ABNORMAL HIGH (ref 0–44)
AST: 23 U/L (ref 15–41)
Albumin: 2.5 g/dL — ABNORMAL LOW (ref 3.5–5.0)
Alkaline Phosphatase: 104 U/L (ref 38–126)
Anion gap: 17 — ABNORMAL HIGH (ref 5–15)
BUN: 92 mg/dL — ABNORMAL HIGH (ref 8–23)
CO2: 17 mmol/L — ABNORMAL LOW (ref 22–32)
Calcium: 9.1 mg/dL (ref 8.9–10.3)
Chloride: 83 mmol/L — ABNORMAL LOW (ref 98–111)
Creatinine, Ser: 4.43 mg/dL — ABNORMAL HIGH (ref 0.61–1.24)
GFR, Estimated: 13 mL/min — ABNORMAL LOW (ref >60)
Glucose, Bld: 102 mg/dL — ABNORMAL HIGH (ref 70–99)
Potassium: 6.7 mmol/L (ref 3.5–5.1)
Sodium: 117 mmol/L — CL (ref 135–145)
Total Bilirubin: 1.4 mg/dL — ABNORMAL HIGH (ref 0.0–1.2)
Total Protein: 6.3 g/dL — ABNORMAL LOW (ref 6.5–8.1)

## 2023-09-26 LAB — BASIC METABOLIC PANEL
Anion gap: 14 (ref 5–15)
BUN: 94 mg/dL — ABNORMAL HIGH (ref 8–23)
CO2: 19 mmol/L — ABNORMAL LOW (ref 22–32)
Calcium: 8.8 mg/dL — ABNORMAL LOW (ref 8.9–10.3)
Chloride: 87 mmol/L — ABNORMAL LOW (ref 98–111)
Creatinine, Ser: 4.18 mg/dL — ABNORMAL HIGH (ref 0.61–1.24)
GFR, Estimated: 14 mL/min — ABNORMAL LOW (ref >60)
Glucose, Bld: 82 mg/dL (ref 70–99)
Potassium: 5.7 mmol/L — ABNORMAL HIGH (ref 3.5–5.1)
Sodium: 120 mmol/L — ABNORMAL LOW (ref 135–145)

## 2023-09-26 LAB — SODIUM: Sodium: 120 mmol/L — ABNORMAL LOW (ref 135–145)

## 2023-09-26 LAB — SODIUM, URINE, RANDOM
Sodium, Ur: <10 mmol/L
Sodium, Ur: <10 mmol/L

## 2023-09-26 LAB — MAGNESIUM: Magnesium: 2.5 mg/dL — ABNORMAL HIGH (ref 1.7–2.4)

## 2023-09-26 LAB — CBG MONITORING, ED
Glucose-Capillary: 74 mg/dL (ref 70–99)
Glucose-Capillary: 87 mg/dL (ref 70–99)
Glucose-Capillary: 88 mg/dL (ref 70–99)

## 2023-09-26 LAB — BRAIN NATRIURETIC PEPTIDE: B Natriuretic Peptide: 198.3 pg/mL — ABNORMAL HIGH (ref 0.0–100.0)

## 2023-09-26 LAB — TSH: TSH: 0.286 u[IU]/mL — ABNORMAL LOW (ref 0.350–4.500)

## 2023-09-26 LAB — OSMOLALITY, URINE: Osmolality, Ur: 298 mosm/kg — ABNORMAL LOW (ref 300–900)

## 2023-09-26 MED ORDER — ONDANSETRON HCL 4 MG/2ML IJ SOLN
4.0000 mg | Freq: Four times a day (QID) | INTRAMUSCULAR | Status: DC | PRN
  Administered 2023-10-15: 4 mg via INTRAVENOUS
  Filled 2023-09-26: qty 2

## 2023-09-26 MED ORDER — INSULIN ASPART 100 UNIT/ML IV SOLN
5.0000 [IU] | Freq: Once | INTRAVENOUS | Status: AC
  Administered 2023-09-26: 5 [IU] via INTRAVENOUS
  Filled 2023-09-26: qty 0.05

## 2023-09-26 MED ORDER — ACETAMINOPHEN 500 MG PO TABS
1000.0000 mg | ORAL_TABLET | Freq: Once | ORAL | Status: AC
  Administered 2023-09-26: 1000 mg via ORAL
  Filled 2023-09-26: qty 2

## 2023-09-26 MED ORDER — SODIUM ZIRCONIUM CYCLOSILICATE 10 G PO PACK
10.0000 g | PACK | Freq: Once | ORAL | Status: AC
  Administered 2023-09-26: 10 g via ORAL
  Filled 2023-09-26: qty 1

## 2023-09-26 MED ORDER — ACETAMINOPHEN 325 MG PO TABS
650.0000 mg | ORAL_TABLET | Freq: Four times a day (QID) | ORAL | Status: DC | PRN
  Administered 2023-10-03 – 2023-10-06 (×2): 650 mg via ORAL
  Filled 2023-09-26 (×3): qty 2

## 2023-09-26 MED ORDER — ONDANSETRON HCL 4 MG PO TABS: 4.0000 mg | ORAL_TABLET | Freq: Four times a day (QID) | ORAL | Status: DC | PRN

## 2023-09-26 MED ORDER — LACTATED RINGERS IV BOLUS
1000.0000 mL | Freq: Once | INTRAVENOUS | Status: AC
  Administered 2023-09-27: 1000 mL via INTRAVENOUS

## 2023-09-26 MED ORDER — SODIUM BICARBONATE 8.4 % IV SOLN
50.0000 meq | Freq: Once | INTRAVENOUS | Status: AC
  Administered 2023-09-26: 50 meq via INTRAVENOUS
  Filled 2023-09-26: qty 50

## 2023-09-26 MED ORDER — SODIUM CHLORIDE 0.9 % IV BOLUS
1000.0000 mL | Freq: Once | INTRAVENOUS | Status: AC
  Administered 2023-09-26: 1000 mL via INTRAVENOUS

## 2023-09-26 MED ORDER — ACETAMINOPHEN 650 MG RE SUPP: 650.0000 mg | Freq: Four times a day (QID) | RECTAL | Status: DC | PRN

## 2023-09-26 MED ORDER — HEPARIN SODIUM (PORCINE) 5000 UNIT/ML IJ SOLN
5000.0000 [IU] | Freq: Three times a day (TID) | INTRAMUSCULAR | Status: DC
  Administered 2023-09-26: 5000 [IU] via SUBCUTANEOUS
  Filled 2023-09-26: qty 1

## 2023-09-26 MED ORDER — SODIUM CHLORIDE 0.9 % IV SOLN
2.0000 g | Freq: Once | INTRAVENOUS | Status: AC
  Administered 2023-09-26: 2 g via INTRAVENOUS
  Filled 2023-09-26: qty 20

## 2023-09-26 MED ORDER — SODIUM CHLORIDE 3 % IV SOLN
INTRAVENOUS | Status: DC
  Filled 2023-09-26 (×2): qty 500

## 2023-09-26 MED ORDER — SENNOSIDES-DOCUSATE SODIUM 8.6-50 MG PO TABS: 1.0000 | ORAL_TABLET | Freq: Every evening | ORAL | Status: DC | PRN

## 2023-09-26 MED ORDER — DEXTROSE 50 % IV SOLN
1.0000 | Freq: Once | INTRAVENOUS | Status: AC
  Administered 2023-09-26: 50 mL via INTRAVENOUS
  Filled 2023-09-26: qty 50

## 2023-09-26 NOTE — ED Notes (Signed)
 Jayseon Nearhood (daughter) phone number [phone]

## 2023-09-26 NOTE — ED Notes (Signed)
 RN in room , pt bp noted to be dropping , RN had secretary call stat page to hospitalist, RN also went to ED MD Monique Ano and informed her of pt decreasing BP. MD Monique Ano to room , pt bp currently 87/45 (57). ,MD reports to take cbg and wait for [facility] response. RN has also messaged MD Crosley who is assigned to pt to inform of bp, RN is awaiting response and orders.

## 2023-09-26 NOTE — ED Notes (Signed)
 [place] PIV placed.

## 2023-09-26 NOTE — ED Provider Notes (Signed)
 [place] EMERGENCY DEPARTMENT AT [facility] Provider Note   CSN: [id number] Arrival date & time: 09/26/23  1402     History  Chief Complaint  Patient presents with   Leg wounds   Leg Swelling    Derek [name] is a 77 y.o. male with PMH as listed below who presents BIBA from home. C/o bilat weeping leg wounds. +4 pitting edema on LE with weeping and sores. Wife at bedside reports increased generalized weakness, inabilty to get around or ambulate, and intermittent confusion as well. Decline in functional status over the last week or so. No f/c, URI sxs, cough, SOB, CP, abd pain, urinary sxs, N/V/D/C. No falls/head trauma. [facility] and is on [facility]. Wife called the triage nurse at the [facility] for the wounds/swelling who told them to present to ED.    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 Authorizing Provider  acetaminophen  (TYLENOL ) 325 MG tablet Take 650 mg by mouth every 6 (six) hours as needed for moderate pain (pain score 4-6).   Yes [provider]  acyclovir (ZOVIRAX) 800 MG tablet Take 400 mg by mouth 2 (two) times daily.   Yes [provider]  aspirin  EC 81 MG tablet Take 1 tablet (81 mg total) by mouth daily. Swallow whole. 07/30/23  Yes Audria Leather, MD  cholecalciferol  (VITAMIN D ) 1000 UNITS tablet Take 1,000 Units by mouth daily.   Yes [provider]  lenalidomide (REVLIMID) 25 MG capsule Take 25 mg by mouth daily. [employer] Auth # N/A     Date Obtained 09-19-2023   Yes [provider]  Nutritional Supplements (PROSTATE PO) Take 1 tablet by mouth 2 (two) times daily.   Yes [provider]  trolamine salicylate (ASPERCREME)  10 % cream Apply 1 application topically as needed for muscle pain.   Yes [provider]  atorvastatin  (LIPITOR) 40 MG tablet Take 1 tablet (40 mg total) by mouth at bedtime. Patient not taking: Reported on 09/26/2023 07/30/23   Audria Leather, MD  clopidogrel  (PLAVIX ) 75 MG tablet Take 1 tablet (75 mg total) by mouth daily. Patient not taking: Reported on 09/26/2023 07/30/23   Audria Leather, MD  lenalidomide (REVLIMID) 15 MG capsule Take 25 mg by mouth daily. [employer] Auth #      Date Obtained Patient not taking: Reported on 09/26/2023 09/19/23 10/10/23  [provider]      Allergies    Patient has no known allergies.    Review of Systems   Review of Systems A 10 point review of systems was performed and is negative unless otherwise reported in HPI.  Physical Exam Updated Vital Signs BP (!) 178/153   Pulse 80   Temp 98.4 F (36.9 C) (Axillary)   Resp 18   Ht 5\' 6"  (1.676 m)   Wt 116.1 kg   SpO2 96%   BMI 41.32 kg/m  Physical Exam General: Normal appearing elderly male, lying in bed.  HEENT: PERRLA, Sclera anicteric, dry mucous membranes, trachea midline. Neck supple.  Cardiology: RRR, no murmurs/rubs/gallops. BL radial and DP pulses equal bilaterally.  Resp: Normal respiratory rate and effort.  CTAB, no wheezes, rhonchi, crackles.  Abd: Soft, non-tender, non-distended. No rebound tenderness or guarding.  GU: Indwelling foley. MSK: 4+ pitting edema in bilateral LEs with chronic changes and ulcers especially on posterior legs, without any areas of particular induration/fluctuance or purulent drainage. Extremities without deformity. No cyanosis or clubbing. Skin: warm, dry. No rashes or lesions. Back: No CVA tenderness Neuro: A&Ox2, not to time or situation, CNs II-XII grossly intact. MAEs with global weakness. Sensation grossly intact. Normal speech but is more confused than normal.  Psych: Normal mood and affect.   ED Results / Procedures / Treatments    Labs (all labs ordered are listed, but only abnormal results are displayed) Labs Reviewed  Pending  EKG EKG Interpretation Date/Time:  Monday September 26 2023 20:04:59 EST Ventricular Rate:  92 PR Interval:  188 QRS Duration:  125 QT Interval:  378 QTC Calculation: 468 R Axis:   83  Text Interpretation: Sinus rhythm Nonspecific intraventricular conduction delay Consider inferior infarct Probable lateral infarct, old Since last tracing rate faster Confirmed by Sueellen Emery [phone]) on 09/26/2023 8:24:14 PM  Radiology No results found.  Procedures Procedures    Medications Ordered in ED Medications  acetaminophen  (TYLENOL ) tablet 1,000 mg (1,000 mg Oral Given 09/26/23 2115)    ED Course/ Medical Decision Making/ A&P                          Medical Decision Making Amount and/or Complexity of Data Reviewed Labs: ordered. Radiology: ordered.  Risk OTC drugs. Prescription drug management. Decision regarding hospitalization.    This patient presents to the ED for concern of AMS, leg swelling, this involves an extensive number of treatment options, and is a complaint that carries with it a high risk of complications and morbidity.  I considered the following differential and admission for this acute, potentially life threatening condition.   MDM:    Ddx of acute altered mental status or encephalopathy considered but not limited to: -Intracranial abnormalities such as ICH, hydrocephalus. No reported head trauma -Infection such as UTI, PNA  -Toxic ingestion such as polypharmacy -Electrolyte abnormalities or hyper/hypoglycemia -Hypercarbia or hypoxia -ACS or arrhythmia - no reported chest pain  For his peripheral edema, he does not have any areas that seem overtly infected with cellulitis or abscess. Soft compartments. Consider renal dysfunction w/ known multiple myeloma, HF or volume overload, dependent edema/lymphedema.      Labs: I Ordered labs, which are  pending  Imaging Studies ordered: I ordered imaging studies including CTH, CXR I independently visualized and interpreted imaging. I agree with the radiologist interpretation  Additional history obtained from chart review, wife at bedside.    Cardiac Monitoring: The patient was maintained on a cardiac monitor.  I personally viewed and interpreted the cardiac monitored which showed an underlying rhythm of: NSR  Reevaluation: After the interventions noted above, I reevaluated the patient and found that they have :stayed the same  Social Determinants of Health: Lives independently with wife  Disposition:  Patient is signed out to the oncoming ED physician Dr. Scarlette Currier pending labs and likely admission.    Co morbidities that complicate the patient evaluation  Past Medical History:  Diagnosis Date   Arthritis    left knee   Cancer (HCC)    GERD (gastroesophageal reflux disease)    Hypercholesterolemia    Obesity    Urethritis    when he was in [employer]     Medicines Meds ordered this  encounter  Medications   sodium chloride  0.9 % bolus 1,000 mL   AND Linked Order Group    insulin  aspart (novoLOG ) injection 5 Units    dextrose  50 % solution 50 mL   sodium bicarbonate  injection 50 mEq   sodium zirconium cyclosilicate  (LOKELMA ) packet 10 g   acetaminophen  (TYLENOL ) tablet 1,000 mg   cefTRIAXone  (ROCEPHIN ) 2 g in sodium chloride  0.9 % 100 mL IVPB    Antibiotic Indication::   UTI   DISCONTD: heparin  injection 5,000 Units   OR Linked Order Group    acetaminophen  (TYLENOL ) tablet 650 mg    acetaminophen  (TYLENOL ) suppository 650 mg   senna-docusate (Senokot-S) tablet 1 tablet   OR Linked Order Group    ondansetron  (ZOFRAN ) tablet 4 mg    ondansetron  (ZOFRAN ) injection 4 mg   DISCONTD: sodium chloride  (hypertonic) 3 % solution   lactated ringers  bolus 1,000 mL   DISCONTD: norepinephrine  (LEVOPHED ) 4mg  in (0.016 mg/mL) premix infusion   0.9 %  sodium chloride   infusion   DISCONTD: norepinephrine  (LEVOPHED ) 4mg  in (0.016 mg/mL) premix infusion    IV Access:   Peripheral   DISCONTD: ceFEPIme  (MAXIPIME ) 2 g in sodium chloride  0.9 % 100 mL IVPB    Antibiotic Indication::   Sepsis   vancomycin  (VANCOREADY) IVPB 2000 mg/400 mL    Indication::   Sepsis   DISCONTD: vancomycin  variable dose per unstable renal function (pharmacist dosing)   DISCONTD: 0.9 %  sodium chloride  infusion   lactated ringers  bolus 500 mL   lactated ringers  infusion   DISCONTD: heparin  injection 5,000 Units   DISCONTD: famotidine  (PEPCID ) tablet 10 mg   DISCONTD: insulin  aspart (novoLOG ) injection 1-3 Units    Correction coverage::   Sensitive Scale    CBG < 70::   Implement Hypoglycemia Standing Orders and refer to Hypoglycemia Standing Orders sidebar report    CBG 121 - 150::   1 unit    CBG 151 - 200::   2 units    CBG 201 - 250::   3 units    CBG > 250 -or- 2 consecutive CBGs > 200::   Implement Phase 2 Adult ICU Glycemic Control Protocol - IV insulin    calcium  gluconate 1 g/ 50 mL sodium chloride  IVPB   DISCONTD: docusate sodium  (COLACE) capsule 100 mg   DISCONTD: polyethylene glycol (MIRALAX  / GLYCOLAX ) packet 17 g   DISCONTD: heparin  injection 5,000 Units   DISCONTD: sodium chloride  (hypertonic) 3 % solution   DISCONTD: sodium hypochlorite (DAKIN'S 1/4 STRENGTH) topical solution    My specialty:   Other (Type approving surgeon's name below)   menthol -cetylpyridinium (CEPACOL) lozenge 3 mg   piperacillin -tazobactam (ZOSYN ) IVPB 3.375 g    Antibiotic Indication::   Aspiration Pneumonia   desmopressin  (DDAVP  NASAL) 0.01 % solution 10 mcg   DISCONTD: Chlorhexidine  Gluconate Cloth 2 % PADS 6 each   DISCONTD: Oral care mouth rinse   DISCONTD: 0.9 %  sodium chloride  infusion (Manually program via Guardrails IV Fluids)   iron  sucrose (VENOFER ) 100 mg in sodium chloride  0.9 % 100 mL IVPB    My patient has the following indication for IV iron  use and meets the criteria  for inpatient administration::   Symptomatic anemia with Hb < 13 g/dL for men, Hb < 12 g/dL for women and at least one of the following: ferritin < 30 (if no inflammatory state), < 100 (if inflammatory state), or < 200 (dialysis patients); transferrin saturation < 20%   DISCONTD: 0.9 %  irrigation (POUR BTL)   DISCONTD: 0.9 %  sodium chloride  infusion (Manually program via Guardrails IV Fluids)   0.9 %  sodium chloride  infusion (Manually program via Guardrails IV Fluids)   amiodarone  (NEXTERONE ) 1.8 mg/mL load via infusion 150 mg   DISCONTD: amiodarone  (NEXTERONE  PREMIX) 360-4.14 MG/200ML-% (1.8 mg/mL) IV infusion   DISCONTD: amiodarone  (NEXTERONE  PREMIX) 360-4.14 MG/200ML-% (1.8 mg/mL) IV infusion   DISCONTD: bupivacaine -EPINEPHrine  (MARCAINE  W/ EPI) 0.25% -1:200000 (with pres) injection   DISCONTD: famotidine  (PEPCID ) tablet 20 mg   DISCONTD: docusate (COLACE) 50 MG/5ML liquid 100 mg   DISCONTD: polyethylene glycol (MIRALAX  / GLYCOLAX ) packet 17 g   DISCONTD: pantoprazole  (PROTONIX ) injection 40 mg   DISCONTD: Oral care mouth rinse   DISCONTD: Oral care mouth rinse   DISCONTD: fentaNYL  (SUBLIMAZE ) injection 25 mcg   DISCONTD: fentaNYL  (SUBLIMAZE ) injection 25-100 mcg   DISCONTD: dexmedetomidine  (PRECEDEX ) 400 MCG/100ML (4 mcg/mL) infusion   DISCONTD: midazolam  (VERSED ) injection 1-2 mg   norepinephrine  (LEVOPHED ) 4mg  in (0.016 mg/mL) premix infusion   DISCONTD: Oral care mouth rinse   Oral care mouth rinse   magnesium  sulfate IVPB 2 g 50 mL   potassium chloride  (KLOR-CON ) packet 40 mEq   DISCONTD: leptospermum manuka honey (MEDIHONEY) paste 1 Application   Chlorhexidine  Gluconate Cloth 2 % PADS 6 each   Oral care mouth rinse   feeding supplement (BOOST / RESOURCE BREEZE) liquid 1 Container   multivitamin with minerals tablet 1 tablet   leptospermum manuka honey (MEDIHONEY) paste 1 Application   oxyCODONE  (Oxy IR/ROXICODONE ) immediate release tablet 5 mg    Refill:  0   docusate  sodium (COLACE) capsule 100 mg   polyethylene glycol (MIRALAX  / GLYCOLAX ) packet 17 g   DISCONTD: insulin  aspart (novoLOG ) injection 1-3 Units    Correction coverage::   Sensitive Scale    CBG < 70::   Implement Hypoglycemia Standing Orders and refer to Hypoglycemia Standing Orders sidebar report    CBG 121 - 150::   1 unit    CBG 151 - 200::   2 units    CBG 201 - 250::   3 units    CBG > 250 -or- 2 consecutive CBGs > 200::   Implement Phase 2 Adult ICU Glycemic Control Protocol - IV insulin    fentaNYL  (SUBLIMAZE ) injection 50 mcg   FOLLOWED BY Linked Order Group    amiodarone  (NEXTERONE ) 1.8 mg/mL load via infusion 150 mg    amiodarone  (NEXTERONE  PREMIX) 360-4.14 MG/200ML-% (1.8 mg/mL) IV infusion    amiodarone  (NEXTERONE  PREMIX) 360-4.14 MG/200ML-% (1.8 mg/mL) IV infusion   lactated ringers  bolus 500 mL   lactated ringers  bolus 500 mL   potassium & sodium phosphates  (PHOS-NAK) 280-160-250 MG packet 2 packet   magnesium  sulfate IVPB 4 g 100 mL   heparin  ADULT infusion 100 units/mL (25000 units/250mL)   insulin  aspart (novoLOG ) injection 1-3 Units    Correction coverage::   Sensitive Scale    CBG < 70::   Implement Hypoglycemia Standing Orders and refer to Hypoglycemia Standing Orders sidebar report    CBG 121 - 150::   1 unit    CBG 151 - 200::   2 units    CBG 201 - 250::   3 units    CBG > 250 -or- 2 consecutive CBGs > 200::   Implement Phase 2 Adult ICU Glycemic Control Protocol - IV insulin    magic mouthwash w/lidocaine     I have reviewed the patients home medicines and have made adjustments as needed  Problem List / ED Course: Problem List Items Addressed This Visit       Genitourinary   AKI (acute kidney injury) (HCC)     Other   * (Principal) Hyponatremia   Hyperkalemia   Peripheral edema - Primary   Pressure injury of contiguous region involving back, buttock, and hip, stage 2 (HCC)   Pressure injury of left thigh, stage 2 (HCC)   Other Visit Diagnoses        Acute cystitis with hematuria                       This note was created using dictation software, which may contain spelling or grammatical errors.    Merdis Stalling, MD 10/02/23 [phone]
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
## 2023-09-26 NOTE — ED Provider Notes (Signed)
 Pt was signed out by Dr. Drury Geralds pending labs.  CBC with hgb low at 9.1 (9.3 on 12/14); CMP with Na low at 117, K elevated at 6.7, CO2 low at 17, BUN elevated at 92 and Cr elevated at 4.43;  Mg elevated at 2.5  Hyperkalemia treated with insulin /glucose, bicarb, lokelma .  Pt also given ivfs.  Foley placed and about 200 cc of dark urine returned.   Pt was turned and he has open sores all along low back, thighs and legs.  Pt's UA c/w infection.  So, urine culture, blood culture, lactic added on.  2g IV rocephin  given.  Repeat K also ordered.  Pt d/w Dr. Gerhardt Knudsen ([facility]) for admission.  CRITICAL CARE Performed by: Sueellen Emery   Total critical care time: 30 minutes  Critical care time was exclusive of separately billable procedures and treating other patients.  Critical care was necessary to treat or prevent imminent or life-threatening deterioration.  Critical care was time spent personally by me on the following activities: development of treatment plan with patient and/or surrogate as well as nursing, discussions with consultants, evaluation of patient's response to treatment, examination of patient, obtaining history from patient or surrogate, ordering and performing treatments and interventions, ordering and review of laboratory studies, ordering and review of radiographic studies, pulse oximetry and re-evaluation of patient's condition.      Sueellen Emery, MD 09/26/23 2149

## 2023-09-26 NOTE — ED Notes (Signed)
 Bladder Scan is broken

## 2023-09-26 NOTE — ED Triage Notes (Signed)
 Pt BIBA from home. C/o bilat weeping leg wounds. +4 pitting edema on LE. States "they hurt when my caretaker has to drag me"  AOx4

## 2023-09-27 ENCOUNTER — Inpatient Hospital Stay (HOSPITAL_COMMUNITY): Payer: No Typology Code available for payment source

## 2023-09-27 DIAGNOSIS — L8942 Pressure ulcer of contiguous site of back, buttock and hip, stage 2: Secondary | ICD-10-CM | POA: Diagnosis not present

## 2023-09-27 DIAGNOSIS — D638 Anemia in other chronic diseases classified elsewhere: Secondary | ICD-10-CM

## 2023-09-27 DIAGNOSIS — Z515 Encounter for palliative care: Secondary | ICD-10-CM

## 2023-09-27 DIAGNOSIS — G9341 Metabolic encephalopathy: Secondary | ICD-10-CM | POA: Diagnosis not present

## 2023-09-27 DIAGNOSIS — Z7189 Other specified counseling: Secondary | ICD-10-CM

## 2023-09-27 DIAGNOSIS — R600 Localized edema: Secondary | ICD-10-CM | POA: Diagnosis not present

## 2023-09-27 DIAGNOSIS — M7989 Other specified soft tissue disorders: Secondary | ICD-10-CM | POA: Diagnosis not present

## 2023-09-27 DIAGNOSIS — E8729 Other acidosis: Secondary | ICD-10-CM

## 2023-09-27 DIAGNOSIS — N390 Urinary tract infection, site not specified: Secondary | ICD-10-CM

## 2023-09-27 DIAGNOSIS — L89222 Pressure ulcer of left hip, stage 2: Secondary | ICD-10-CM

## 2023-09-27 DIAGNOSIS — A419 Sepsis, unspecified organism: Secondary | ICD-10-CM | POA: Diagnosis present

## 2023-09-27 DIAGNOSIS — R579 Shock, unspecified: Secondary | ICD-10-CM

## 2023-09-27 DIAGNOSIS — E871 Hypo-osmolality and hyponatremia: Secondary | ICD-10-CM

## 2023-09-27 DIAGNOSIS — N179 Acute kidney failure, unspecified: Secondary | ICD-10-CM

## 2023-09-27 DIAGNOSIS — R4589 Other symptoms and signs involving emotional state: Secondary | ICD-10-CM

## 2023-09-27 DIAGNOSIS — E43 Unspecified severe protein-calorie malnutrition: Secondary | ICD-10-CM

## 2023-09-27 DIAGNOSIS — E875 Hyperkalemia: Secondary | ICD-10-CM

## 2023-09-27 LAB — CBC WITH DIFFERENTIAL/PLATELET
Abs Immature Granulocytes: 0.1 10*3/uL — ABNORMAL HIGH (ref 0.00–0.07)
Abs Immature Granulocytes: 0.65 10*3/uL — ABNORMAL HIGH (ref 0.00–0.07)
Band Neutrophils: 5 %
Basophils Absolute: 0 10*3/uL (ref 0.0–0.1)
Basophils Absolute: 0 10*3/uL (ref 0.0–0.1)
Basophils Relative: 0 %
Basophils Relative: 1 %
Eosinophils Absolute: 0.1 10*3/uL (ref 0.0–0.5)
Eosinophils Absolute: 0 10*3/uL (ref 0.0–0.5)
Eosinophils Relative: 2 %
Eosinophils Relative: 0 %
HCT: 21.2 % — ABNORMAL LOW (ref 39.0–52.0)
HCT: 22.4 % — ABNORMAL LOW (ref 39.0–52.0)
Hemoglobin: 7 g/dL — ABNORMAL LOW (ref 13.0–17.0)
Hemoglobin: 7.3 g/dL — ABNORMAL LOW (ref 13.0–17.0)
Immature Granulocytes: 16 %
Lymphocytes Relative: 16 %
Lymphocytes Relative: 17 %
Lymphs Abs: 1 10*3/uL (ref 0.7–4.0)
Lymphs Abs: 0.7 10*3/uL (ref 0.7–4.0)
MCH: 32.1 pg (ref 26.0–34.0)
MCH: 31.5 pg (ref 26.0–34.0)
MCHC: 33 g/dL (ref 30.0–36.0)
MCHC: 32.6 g/dL (ref 30.0–36.0)
MCV: 97.2 fL (ref 80.0–100.0)
MCV: 96.6 fL (ref 80.0–100.0)
Metamyelocytes Relative: 1 %
Monocytes Absolute: 0.6 10*3/uL (ref 0.1–1.0)
Monocytes Absolute: 0.3 10*3/uL (ref 0.1–1.0)
Monocytes Relative: 10 %
Monocytes Relative: 8 %
Neutro Abs: 4.5 10*3/uL (ref 1.7–7.7)
Neutro Abs: 2.5 10*3/uL (ref 1.7–7.7)
Neutrophils Relative %: 66 %
Neutrophils Relative %: 58 %
Platelets: 208 10*3/uL (ref 150–400)
Platelets: 217 10*3/uL (ref 150–400)
RBC: 2.18 MIL/uL — ABNORMAL LOW (ref 4.22–5.81)
RBC: 2.32 MIL/uL — ABNORMAL LOW (ref 4.22–5.81)
RDW: 15.6 % — ABNORMAL HIGH (ref 11.5–15.5)
RDW: 15.8 % — ABNORMAL HIGH (ref 11.5–15.5)
Smear Review: NORMAL
WBC: 6.4 10*3/uL (ref 4.0–10.5)
WBC: 4.2 10*3/uL (ref 4.0–10.5)
nRBC: 0 % (ref 0.0–0.2)
nRBC: 0 % (ref 0.0–0.2)

## 2023-09-27 LAB — BLOOD GAS, ARTERIAL
Acid-base deficit: 3 mmol/L — ABNORMAL HIGH (ref 0.0–2.0)
Bicarbonate: 21.1 mmol/L (ref 20.0–28.0)
Drawn by: 56037
O2 Saturation: 98.7 %
Patient temperature: 36.3
pCO2 arterial: 33 mm[Hg] (ref 32–48)
pH, Arterial: 7.41 (ref 7.35–7.45)
pO2, Arterial: 62 mm[Hg] — ABNORMAL LOW (ref 83–108)

## 2023-09-27 LAB — BASIC METABOLIC PANEL
Anion gap: 14 (ref 5–15)
Anion gap: 10 (ref 5–15)
BUN: 87 mg/dL — ABNORMAL HIGH (ref 8–23)
BUN: 75 mg/dL — ABNORMAL HIGH (ref 8–23)
CO2: 19 mmol/L — ABNORMAL LOW (ref 22–32)
CO2: 19 mmol/L — ABNORMAL LOW (ref 22–32)
Calcium: 8.5 mg/dL — ABNORMAL LOW (ref 8.9–10.3)
Calcium: 7.7 mg/dL — ABNORMAL LOW (ref 8.9–10.3)
Chloride: 89 mmol/L — ABNORMAL LOW (ref 98–111)
Chloride: 85 mmol/L — ABNORMAL LOW (ref 98–111)
Creatinine, Ser: 3.56 mg/dL — ABNORMAL HIGH (ref 0.61–1.24)
Creatinine, Ser: 3.22 mg/dL — ABNORMAL HIGH (ref 0.61–1.24)
GFR, Estimated: 17 mL/min — ABNORMAL LOW (ref >60)
GFR, Estimated: 19 mL/min — ABNORMAL LOW (ref >60)
Glucose, Bld: 97 mg/dL (ref 70–99)
Glucose, Bld: 417 mg/dL — ABNORMAL HIGH (ref 70–99)
Potassium: 5.4 mmol/L — ABNORMAL HIGH (ref 3.5–5.1)
Potassium: 4.8 mmol/L (ref 3.5–5.1)
Sodium: 122 mmol/L — ABNORMAL LOW (ref 135–145)
Sodium: 114 mmol/L — CL (ref 135–145)

## 2023-09-27 LAB — URINE CULTURE: Culture: NO GROWTH

## 2023-09-27 LAB — OSMOLALITY, URINE: Osmolality, Ur: 302 mosm/kg (ref 300–900)

## 2023-09-27 LAB — SODIUM
Sodium: 115 mmol/L — CL (ref 135–145)
Sodium: 126 mmol/L — ABNORMAL LOW (ref 135–145)
Sodium: 130 mmol/L — ABNORMAL LOW (ref 135–145)
Sodium: 129 mmol/L — ABNORMAL LOW (ref 135–145)

## 2023-09-27 LAB — BLOOD GAS, VENOUS
Acid-base deficit: 2.9 mmol/L — ABNORMAL HIGH (ref 0.0–2.0)
Bicarbonate: 21.9 mmol/L (ref 20.0–28.0)
O2 Saturation: 88.2 %
Patient temperature: 37
pCO2, Ven: 37 mm[Hg] — ABNORMAL LOW (ref 44–60)
pH, Ven: 7.38 (ref 7.25–7.43)
pO2, Ven: 54 mm[Hg] — ABNORMAL HIGH (ref 32–45)

## 2023-09-27 LAB — VITAMIN B12: Vitamin B-12: >7500 pg/mL — ABNORMAL HIGH (ref 180–914)

## 2023-09-27 LAB — MRSA NEXT GEN BY PCR, NASAL
MRSA by PCR Next Gen: NOT DETECTED
MRSA by PCR Next Gen: NOT DETECTED

## 2023-09-27 LAB — PREPARE RBC (CROSSMATCH)

## 2023-09-27 LAB — I-STAT CG4 LACTIC ACID, ED: Lactic Acid, Venous: 2 mmol/L (ref 0.5–1.9)

## 2023-09-27 LAB — CBG MONITORING, ED
Glucose-Capillary: 83 mg/dL (ref 70–99)
Glucose-Capillary: 81 mg/dL (ref 70–99)
Glucose-Capillary: 80 mg/dL (ref 70–99)
Glucose-Capillary: 83 mg/dL (ref 70–99)
Glucose-Capillary: 77 mg/dL (ref 70–99)

## 2023-09-27 LAB — MAGNESIUM: Magnesium: 1.9 mg/dL (ref 1.7–2.4)

## 2023-09-27 LAB — PHOSPHORUS: Phosphorus: 5.5 mg/dL — ABNORMAL HIGH (ref 2.5–4.6)

## 2023-09-27 LAB — GLUCOSE, CAPILLARY
Glucose-Capillary: 83 mg/dL (ref 70–99)
Glucose-Capillary: 77 mg/dL (ref 70–99)
Glucose-Capillary: 80 mg/dL (ref 70–99)

## 2023-09-27 LAB — D-DIMER, QUANTITATIVE: D-Dimer, Quant: 1.89 ug{FEU}/mL — ABNORMAL HIGH (ref 0.00–0.50)

## 2023-09-27 LAB — IRON AND TIBC
Iron: 13 ug/dL — ABNORMAL LOW (ref 45–182)
Saturation Ratios: 10 % — ABNORMAL LOW (ref 17.9–39.5)
TIBC: 130 ug/dL — ABNORMAL LOW (ref 250–450)
UIBC: 117 ug/dL

## 2023-09-27 LAB — FERRITIN: Ferritin: 1914 ng/mL — ABNORMAL HIGH (ref 24–336)

## 2023-09-27 LAB — FOLATE: Folate: 6.1 ng/mL (ref >5.9)

## 2023-09-27 LAB — CORTISOL: Cortisol, Plasma: 31.8 ug/dL

## 2023-09-27 LAB — OSMOLALITY: Osmolality: 290 mosm/kg (ref 275–295)

## 2023-09-27 MED ORDER — SODIUM CHLORIDE 0.9% IV SOLUTION: Freq: Once | INTRAVENOUS | Status: DC

## 2023-09-27 MED ORDER — NOREPINEPHRINE 4 MG/250ML-% IV SOLN
0.0000 ug/min | INTRAVENOUS | Status: DC
  Administered 2023-09-27 (×2): 2 ug/min via INTRAVENOUS
  Filled 2023-09-27 (×2): qty 250

## 2023-09-27 MED ORDER — LACTATED RINGERS IV BOLUS
500.0000 mL | Freq: Once | INTRAVENOUS | Status: AC
  Administered 2023-09-27: 500 mL via INTRAVENOUS

## 2023-09-27 MED ORDER — HEPARIN SODIUM (PORCINE) 5000 UNIT/ML IJ SOLN: 5000.0000 [IU] | Freq: Two times a day (BID) | INTRAMUSCULAR | Status: DC

## 2023-09-27 MED ORDER — FAMOTIDINE 20 MG PO TABS
10.0000 mg | ORAL_TABLET | Freq: Every day | ORAL | Status: DC
  Administered 2023-09-27: 10 mg via ORAL
  Filled 2023-09-27: qty 1

## 2023-09-27 MED ORDER — DESMOPRESSIN ACETATE SPRAY 0.01 % NA SOLN
10.0000 ug | Freq: Two times a day (BID) | NASAL | Status: AC
  Administered 2023-09-27 (×2): 10 ug via NASAL
  Filled 2023-09-27 (×2): qty 5

## 2023-09-27 MED ORDER — LACTATED RINGERS IV SOLN: INTRAVENOUS | Status: AC

## 2023-09-27 MED ORDER — PIPERACILLIN-TAZOBACTAM 3.375 G IVPB
3.3750 g | Freq: Three times a day (TID) | INTRAVENOUS | Status: AC
  Administered 2023-09-27 – 2023-10-15 (×55): 3.375 g via INTRAVENOUS
  Filled 2023-09-27 (×54): qty 50

## 2023-09-27 MED ORDER — SODIUM CHLORIDE 0.9 % IV SOLN
2.0000 g | INTRAVENOUS | Status: DC
  Administered 2023-09-27: 2 g via INTRAVENOUS
  Filled 2023-09-27: qty 12.5

## 2023-09-27 MED ORDER — SODIUM CHLORIDE 0.9 % IV SOLN: INTRAVENOUS | Status: DC

## 2023-09-27 MED ORDER — DOCUSATE SODIUM 100 MG PO CAPS
100.0000 mg | ORAL_CAPSULE | Freq: Two times a day (BID) | ORAL | Status: DC
  Administered 2023-09-27 (×2): 100 mg via ORAL
  Filled 2023-09-27 (×2): qty 1

## 2023-09-27 MED ORDER — DAKINS (1/4 STRENGTH) 0.125 % EX SOLN
Freq: Two times a day (BID) | CUTANEOUS | Status: DC
  Filled 2023-09-27: qty 473

## 2023-09-27 MED ORDER — SODIUM CHLORIDE 0.9 % IV SOLN: 250.0000 mL | INTRAVENOUS | Status: AC

## 2023-09-27 MED ORDER — VANCOMYCIN HCL 2000 MG/400ML IV SOLN
2000.0000 mg | Freq: Once | INTRAVENOUS | Status: AC
  Administered 2023-09-27: 2000 mg via INTRAVENOUS
  Filled 2023-09-27: qty 400

## 2023-09-27 MED ORDER — MENTHOL 3 MG MT LOZG: 1.0000 | LOZENGE | OROMUCOSAL | Status: DC | PRN

## 2023-09-27 MED ORDER — INSULIN ASPART 100 UNIT/ML IJ SOLN
1.0000 [IU] | INTRAMUSCULAR | Status: DC
  Administered 2023-09-28: 2 [IU] via SUBCUTANEOUS
  Administered 2023-09-29 (×2): 1 [IU] via SUBCUTANEOUS
  Filled 2023-09-27: qty 0.03

## 2023-09-27 MED ORDER — VANCOMYCIN VARIABLE DOSE PER UNSTABLE RENAL FUNCTION (PHARMACIST DOSING)
Status: DC
Start: 2023-09-27 — End: 2023-09-27

## 2023-09-27 MED ORDER — ORAL CARE MOUTH RINSE: 15.0000 mL | OROMUCOSAL | Status: DC | PRN

## 2023-09-27 MED ORDER — POLYETHYLENE GLYCOL 3350 17 G PO PACK: 17.0000 g | PACK | Freq: Every day | ORAL | Status: DC | PRN

## 2023-09-27 MED ORDER — SODIUM CHLORIDE 0.9 % IV SOLN
100.0000 mg | INTRAVENOUS | Status: AC
  Administered 2023-09-27 – 2023-10-01 (×5): 100 mg via INTRAVENOUS
  Filled 2023-09-27 (×3): qty 5
  Filled 2023-09-27: qty 100
  Filled 2023-09-27: qty 5

## 2023-09-27 MED ORDER — CHLORHEXIDINE GLUCONATE CLOTH 2 % EX PADS
6.0000 | MEDICATED_PAD | Freq: Every day | CUTANEOUS | Status: DC
  Administered 2023-09-27 – 2023-09-28 (×2): 6 via TOPICAL

## 2023-09-27 MED ORDER — CALCIUM GLUCONATE-NACL 1-0.675 GM/50ML-% IV SOLN
1.0000 g | Freq: Once | INTRAVENOUS | Status: AC
  Administered 2023-09-27: 1000 mg via INTRAVENOUS
  Filled 2023-09-27: qty 50

## 2023-09-27 MED ORDER — SODIUM CHLORIDE 3 % IV SOLN
INTRAVENOUS | Status: DC
  Administered 2023-09-27: 30 mL/h via INTRAVENOUS
  Filled 2023-09-27: qty 500

## 2023-09-27 MED ORDER — NOREPINEPHRINE 4 MG/250ML-% IV SOLN: 0.0000 ug/min | INTRAVENOUS | Status: DC

## 2023-09-27 NOTE — ED Notes (Signed)
Gone to CT

## 2023-09-27 NOTE — Consult Note (Signed)
NAME:  Derek [name], MRN:  [id number], DOB:  14-Sep-1945, LOS: 1 ADMISSION DATE:  09/26/2023, CONSULTATION DATE: 09/27/2023 REFERRING MD: Loetta Rough, MD, CHIEF COMPLAINT: hypotension   History of Present Illness:  A 78 y.o. male with multiple myeloma on chemotherapy, dyslipidemia, and GERD.  Brought in by family for increasing lower extremity edema, perineal weeping sores, and worsening generalized weakness for 1-2 weeks.  At baseline patient relatively independent.  He is experience rapid decline in function recently.  Per patient report because he is difficult to move he has been left in his urine and feces.  Per wife the patient experiences pain and does not want to be moved. Smoker. In ED, he was found to be hypotensive and given 2.5 L LR, Vanco, Rocephin, Cefepime, Levophed @ 7 mcg/min now, Rx for hyperK, and HTS for hypoNa.    Pertinent  Medical History  Multiple myeloma on chemotherapy, dyslipidemia, GERD  Significant Hospital Events: Including procedures, antibiotic start and stop dates in addition to other pertinent events     Interim History / Subjective:    Objective   Blood pressure (!) 115/54, pulse 96, temperature (!) 97.3 F (36.3 C), temperature source Oral, resp. rate 12, height 5\' 6"  (1.676 m), weight 108.9 kg, SpO2 93%.        Intake/Output Summary (Last 24 hours) at 09/27/2023 0346 Last data filed at 09/26/2023 2323 Gross per 24 hour  Intake 1100 ml  Output --  Net 1100 ml   Filed Weights   09/26/23 1423  Weight: 108.9 kg    Examination: General: alert, oriented x3, and comfortable. On RA. SpO2 95%  HENT: PERL, normal pharynx and oral mucosa (dry mucosa). No LNE or thyromegaly. No JVD Lungs: symmetrical air entry bilaterally. No crackles or wheezing Cardiovascular: NL S1/S2. No m/g/r Abdomen: no distension. Mild upper abd tenderness. No rebound.  Extremities: +4 edema. Symmetrical  Neuro: nonfocal   Stage 2 peritoneal ulcers, upper posterior thighs  and scrotum   Resolved Hospital Problem list     Assessment & Plan:  Shock due to severe dehydration and possible septic -IVF -Serial LA -I/O chart -Cefepime -D/c Vanco (MRSA screening is negative) -f/u Cx -Venous LL doppler  -Cortisol level   Echo EF 60% (Dec 2024)  AKI due to shock -IVF -Am labs -I/O chart  Mild metabolic encephalopathy due to hypoNa, shock, acidosis, and uremia -Ammonia level -Vit B12, folate and fe levels  Lactic acidosis/AGMA due to shock - as above   Severe PCM -Dietitian eval  UTI -Abx -CT abd/pelvis wo contrast -f/u Cx  HypoNa -monitor Na, goal 8 meq/L daily  HyperK -Serial labs -s/p hyperk protocol  Bedsores -wound care  Anemia of chronic illness -Monitor  Multiple myeloma on chemotherapy  Dyslipidemia -Hold statin foe now  GERD -H2B   Best Practice (right click and "Reselect all SmartList Selections" daily)   Diet/type: Regular consistency (see orders) DVT prophylaxis prophylactic heparin  Pressure ulcer(s): present on admission  GI prophylaxis: H2B Lines: N/A Foley:  Yes, and it is still needed Code Status:  full code Last date of multidisciplinary goals of care discussion []   Labs   CBC: Recent Labs  Lab 09/26/23 1706  WBC 8.5  NEUTROABS 6.7  HGB 9.1*  HCT 28.3*  MCV 95.6  PLT 293    Basic Metabolic Panel: Recent Labs  Lab 09/26/23 1706 09/26/23 2155 09/26/23 2232  NA 117* 120* 120*  K 6.7*  --  5.7*  CL 83*  --  87*  CO2 17*  --  19*  GLUCOSE 102*  --  82  BUN 92*  --  94*  CREATININE 4.43*  --  4.18*  CALCIUM 9.1  --  8.8*  MG 2.5*  --   --    GFR: Estimated Creatinine Clearance: 17.4 mL/min (A) (by C-G formula based on SCr of 4.18 mg/dL (H)). Recent Labs  Lab 09/26/23 1706 09/26/23 2248 09/27/23 0024  WBC 8.5  --   --   LATICACIDVEN  --  3.0* 2.0*    Liver Function Tests: Recent Labs  Lab 09/26/23 1706  AST 23  ALT 50*  ALKPHOS 104  BILITOT 1.4*  PROT 6.3*  ALBUMIN  2.5*   No results for input(s): "LIPASE", "AMYLASE" in the last 168 hours. No results for input(s): "AMMONIA" in the last 168 hours.  ABG    Component Value Date/Time   PHART 7.41 09/27/2023 0108   PCO2ART 33 09/27/2023 0108   PO2ART 62 (L) 09/27/2023 0108   HCO3 21.1 09/27/2023 0108   TCO2 30 09/27/2007 1923   ACIDBASEDEF 3.0 (H) 09/27/2023 0108   O2SAT 98.7 09/27/2023 0108     Coagulation Profile: No results for input(s): "INR", "PROTIME" in the last 168 hours.  Cardiac Enzymes: No results for input(s): "CKTOTAL", "CKMB", "CKMBINDEX", "TROPONINI" in the last 168 hours.  HbA1C: Hemoglobin A1C  Date/Time Value Ref Range Status  10/05/2013 11:45 AM 4.9 %  Final    Comment:    normal range 4.2-6.3 %   HB A1C (BAYER DCA - WAIVED)  Date/Time Value Ref Range Status  11/17/2020 11:49 AM 4.9 <7.0 % Final    Comment:                                          Diabetic Adult            <7.0                                       Healthy Adult        4.3 - 5.7                                                           (DCCT/NGSP) American Diabetes Association's Summary of Glycemic Recommendations for Adults with Diabetes: Hemoglobin A1c <7.0%. More stringent glycemic goals (A1c <6.0%) may further reduce complications at the cost of increased risk of hypoglycemia.    Hgb A1c MFr Bld  Date/Time Value Ref Range Status  07/29/2023 05:03 AM 4.8 4.8 - 5.6 % Final    Comment:    (NOTE) Pre diabetes:          5.7%-6.4%  Diabetes:              >6.4%  Glycemic control for   <7.0% adults with diabetes     CBG: Recent Labs  Lab 09/26/23 2128 09/26/23 2246 09/26/23 2355 09/27/23 0310  GLUCAP 88 87 74 83    Review of Systems:   Limited due to AMS  Past Medical History:  He,  has a past medical history of Arthritis, Cancer (HCC),  GERD (gastroesophageal reflux disease), Hypercholesterolemia, Obesity, and Urethritis.   Surgical History:   Past Surgical History:  Procedure  Laterality Date   ANTERIOR CERVICAL DECOMP/DISCECTOMY FUSION N/A 05/06/2021   Procedure: Anterior Cervical Discectomy Fusion Cervical Three-Four, Cervical Four-Five;  Surgeon: Bedelia Person, MD;  Location: [facility];  Service: Neurosurgery;  Laterality: N/A;   COLONOSCOPY     IR BONE MARROW BIOPSY & ASPIRATION  04/27/2023   ORIF FEMUR FRACTURE Left 12/06/2022   Procedure: OPEN REDUCTION INTERNAL FIXATION (ORIF) DISTAL FEMUR FRACTURE;  Surgeon: Roby Lofts, MD;  Location: [facility] OR;  Service: Orthopedics;  Laterality: Left;   TOTAL KNEE ARTHROPLASTY Left 07/22/2020   Procedure: LEFT TOTAL KNEE ARTHROPLASTY;  Surgeon: Kathryne Hitch, MD;  Location: [facility] OR;  Service: Orthopedics;  Laterality: Left;   TRANSFORAMINAL LUMBAR INTERBODY FUSION (TLIF) WITH PEDICLE SCREW FIXATION 1 LEVEL N/A 10/20/2021   Procedure: Transforaminal Lumbar Interbody Fusion Lumbar Four-Five;  Surgeon: Bedelia Person, MD;  Location: [facility] OR;  Service: Neurosurgery;  Laterality: N/A;  Transforaminal Lumbar Interbody Fusion Lumbar Four-Five     Social History:   reports that he has never smoked. He has never used smokeless tobacco. He reports that he does not drink alcohol and does not use drugs.   Family History:  His family history includes Cancer in his mother; Diabetes in his father.   Allergies No Known Allergies   Home Medications  Prior to Admission medications   Medication Sig Start Date End Date Taking? Authorizing Provider  acetaminophen (TYLENOL) 325 MG tablet Take 650 mg by mouth every 6 (six) hours as needed for moderate pain (pain score 4-6).   Yes [provider]  acyclovir (ZOVIRAX) 800 MG tablet Take 400 mg by mouth 2 (two) times daily.   Yes [provider]  aspirin EC 81 MG tablet Take 1 tablet (81 mg total) by mouth daily. Swallow whole. 07/30/23  Yes Glade Lloyd, MD  cholecalciferol (VITAMIN D) 1000 UNITS tablet Take 1,000 Units by mouth daily.   Yes [provider]  lenalidomide (REVLIMID) 25 MG capsule Take 25 mg by mouth daily. [employer] Auth # N/A     Date Obtained 09-19-2023   Yes [provider]  Nutritional Supplements (PROSTATE PO) Take 1 tablet by mouth 2 (two) times daily.   Yes [provider]  trolamine salicylate (ASPERCREME) 10 % cream Apply 1 application topically as needed for muscle pain.   Yes [provider]  atorvastatin (LIPITOR) 40 MG tablet Take 1 tablet (40 mg total) by mouth at bedtime. Patient not taking: Reported on 09/26/2023 07/30/23   Glade Lloyd, MD  clopidogrel (PLAVIX) 75 MG tablet Take 1 tablet (75 mg total) by mouth daily. Patient not taking: Reported on 09/26/2023 07/30/23   Glade Lloyd, MD  lenalidomide (REVLIMID) 15 MG capsule Take 25 mg by mouth daily. [employer] Auth #      Date Obtained Patient not taking: Reported on 09/26/2023 09/19/23 10/10/23  [provider]     Critical care time: 60 min
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
## 2023-09-27 NOTE — Progress Notes (Addendum)
PHARMACY - ANTICOAGULATION CONSULT NOTE  Pharmacy Consult for Heparin Indication: VTE treatment  No Known Allergies  Patient Measurements: Height: 5\' 6"  (167.6 cm) Weight: 108.9 kg (240 lb) IBW/kg (Calculated) : 63.8 Heparin Dosing Weight: 88 kg  Vital Signs: Temp: 97.3 F (36.3 C) (02/11 0917) Temp Source: Oral (02/11 0917) BP: 117/53 (02/11 0930) Pulse Rate: 92 (02/11 0930)  Labs: Recent Labs    09/26/23 1706 09/26/23 2232 09/27/23 0314 09/27/23 0515  HGB 9.1*  --   --  7.0*  HCT 28.3*  --   --  21.2*  PLT 293  --   --  208  CREATININE 4.43* 4.18* 3.56* 3.22*    Estimated Creatinine Clearance: 22.6 mL/min (A) (by C-G formula based on SCr of 3.22 mg/dL (H)).   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  PMH is significant for multiple myeloma on chemotherapy through [place], recent admission 12/13-12/14 for suspected TIA.  Pharmacy is consulted to dose heparin for new DVT.    No prior to admission anticoagulation.  Heparin 5000 units SQ given on 2/10 at 22:00.    CBC:  Hgb decreased to 7 (baseline Hgb ~9s), Plt WNL D-dimer 1.89 SCr 3.22 (baseline 0.71, 07/2023) Doppler: left femoral vein acute DVT 2/10 CT head: No evidence of acute intracranial abnormality.  Chronic small vessel ischemia.   Goal of Therapy:  Heparin level 0.3-0.7 units/ml Monitor platelets by anticoagulation protocol: Yes   Plan:  Discussed worsening anemia with Dr. Katrinka Blazing who wants to hold on heparin for now.   Heparin consult d/c for clarity.  Please re-consult pharmacy if/when safe to start anticoagulation.    Lynann Beaver PharmD, BCPS [facility] main pharmacy [phone] 09/27/2023 9:56 AM

## 2023-09-27 NOTE — Progress Notes (Signed)
Pharmacy Antibiotic Note  Derek [name] is a 78 y.o. male admitted on 09/26/2023 with bilateral leg and sacral wounds, pneumonia.  Pharmacy has been consulted for Zosyn dosing for aspiration pneumonia.    Plan: Zosyn 3.375g IV Q8H infused over 4hrs. Follow up renal function, culture results, and clinical course.   Height: 5\' 6"  (167.6 cm) Weight: 108.9 kg (240 lb) IBW/kg (Calculated) : 63.8  Temp (24hrs), Avg:97.5 F (36.4 C), Min:97.3 F (36.3 C), Max:97.7 F (36.5 C)  Recent Labs  Lab 09/26/23 1706 09/26/23 2232 09/26/23 2248 09/27/23 0024 09/27/23 0314 09/27/23 0515  WBC 8.5  --   --   --   --  6.4  CREATININE 4.43* 4.18*  --   --  3.56* 3.22*  LATICACIDVEN  --   --  3.0* 2.0*  --   --     Estimated Creatinine Clearance: 22.6 mL/min (A) (by C-G formula based on SCr of 3.22 mg/dL (H)).    No Known Allergies  Antimicrobials this admission: 2/10 Ceftriaxone x1 2/11 Cefepime >> 2/11 2/11 Vancomycin >> 2/11 2/11 Zosyn >>   Dose adjustments this admission:   Microbiology results: 2/10 BCx: ngtd 2/10 MRSA PCR: not detected 2/10 UCx:   Thank you for allowing pharmacy to be a part of this patient's care.  Lynann Beaver PharmD, BCPS [facility] main pharmacy [phone] 09/27/2023 10:44 AM

## 2023-09-27 NOTE — Consult Note (Signed)
WOC Nurse Consult Note: Reason for Consult:sacral wound  Wound type:1. Unstageable Pressure Injury to sacrum/buttocks/B ischium  2.  Full thickness wounds posterior lower legs likely r/t venous insufficiency  3.  Deep Tissue Pressure Injury L heel  Pressure Injury POA: Yes Measurement: 1.  Extensive Unstageable Pressure Injury from sacrum extending down to B ischium greater than 40 cms x 20 cms  2.  Full thickness to R posterior leg 10 x 12 cm x 1 cm 75% yellow 25% pink  3.  Full thickness wounds x approximately 6 L posterior leg largest measuring 2 cm x 2 cm 100% yellow slough mid posterior leg, 2 cm x 2 cm lateral posterior leg 50% yellow 50% pink others appear mostly pink  4.  L heel DTPI 2 cm x 2 cm purple maroon discoloration  Wound bed:as above  Drainage (amount, consistency, odor) tan exudate to B posterior leg wounds, moderate amount of very foul smelling exudate from sacral/buttocks wound  Periwound: legs edematous  Dressing procedure/placement/frequency:  Cleanse sacrum/coccyx/buttocks/B ischium with Vashe wound cleanser [place] [phone]), apply Dakin's moistened gauze to entire wound bed 2 times daily x 5 days.  Cover with ABD pads and tape or silicone foams whichever is preferred.   Cleanse B lower leg wounds with Vashe wound cleanser, apply Xeroform gauze [place] [phone] to wound beds daily.  Apply Xeroform gauze to L heel. Cover with ABD pads and Kerlix roll gauze beginning just above toes and ending right below knee.  Place L foot into Prevalon boot to offload pressure.    POC discussed with primary MD and bedside nurse.  If aggressive measures are desired this wound will require surgical consult and if not deemed a surgical candidate then PT hydrotherapy to help clean up dead tissue.    Patient should be placed on a low air loss mattress for pressure redistribution and moisture management if placed outside the ICU setting.    WOC team will not follow. Re-consult if further needs  arise.   Thank you,    Priscella Mann MSN, RN-BC, [employer] [phone]

## 2023-09-27 NOTE — ED Notes (Signed)
Paramedic made MD aware of critical sodium of 114, he has messaged back this RN and that Paramedic and states he will but in orders

## 2023-09-27 NOTE — Progress Notes (Signed)
Pharmacy Antibiotic Note  Derek [name] is a 78 y.o. male admitted on 09/26/2023 with sepsis.  Source unclear- possibly from sacral decubitus.  Pharmacy has been consulted for Cefepime + Vancomycin dosing.  AKI noted  Plan: Cefepime 2gm IV q24h Vancomycin 2gm IV x1.  Re-dose when random level <15. Monitor renal function and cx data   Height: 5\' 6"  (167.6 cm) Weight: 108.9 kg (240 lb) IBW/kg (Calculated) : 63.8  Temp (24hrs), Avg:97.4 F (36.3 C), Min:97.3 F (36.3 C), Max:97.5 F (36.4 C)  Recent Labs  Lab 09/26/23 1706 09/26/23 2232 09/26/23 2248  WBC 8.5  --   --   CREATININE 4.43* 4.18*  --   LATICACIDVEN  --   --  3.0*    Estimated Creatinine Clearance: 17.4 mL/min (A) (by C-G formula based on SCr of 4.18 mg/dL (H)).    No Known Allergies  Antimicrobials this admission: 2/10 Rocephin x1 2/11 Cefepime >>  2/11 Vancomycin >>   Dose adjustments this admission:  Microbiology results: 2/10 BCx:  2/10 UCx:   2/10 MRSA PCR: negative  Thank you for allowing pharmacy to be a part of this patient's care.  Junita Push PharmD 09/27/2023 12:13 AM

## 2023-09-27 NOTE — Consult Note (Signed)
Consultation Note Date: 09/27/2023   Patient Name: Derek [name]  DOB: 17-May-1946  MRN: [id number]  Age / Sex: 78 y.o., male   PCP: [facility] Referring Physician: Lorin Glass, MD  Reason for Consultation: Establishing goals of care     Chief Complaint/History of Present Illness:   Patient is a 78 year old male with a past medical history of multiple myeloma on chemotherapy, dyslipidemia, and GERD who was admitted on 09/27/2023 for management of increasing lower extremity edema, peritoneal weeping sores, and worsening generalized weakness for the past 1 to 2 weeks.  Upon admission patient found to be in septic shock due to severe dehydration and possible sepsis, AKI, lactic acidosis, and to have multiple electrolyte abnormalities.  Palliative medicine team consulted to assist with complex medical decision making.  Extensive review of EMR prior to presenting to bedside.  Also discussed care with ICU provider for updates.  Presented to see patient in the ER.  Patient laying in bed resting though easily awakened.  Able to introduce myself and the role of the palliative medicine team in patient's medical journey.  Patient able to introduce his wife, Derek [name], who was present at bedside.  With permission, able to discuss patient's current medical status since patient did not remember discussion with ICU provider and wife was not present for it.  Discussed patient's critical illness and multi organ failure.  Discussed the patient is requiring medications to artificially keep his blood pressure up at this time.  Continuing appropriate aggressive medical interventions would plan for patient to go to the ICU.  Spent time learning about patient's life at home prior to this.  After discussion, patient admitted that the last time he essentially walked was a year ago.  Patient has been transitioning in the car and using his wheelchair.  Patient has become increasingly bed bound.  Patient also noted  that he was "overweight" so he started dieting though now is avoiding food at all due to the diarrhea it can cause and worsen the pain on his buttocks from his wound.  Wife, Derek [name], noted that she just started living again with patient upon saying that he was medically deteriorating.  Patient was also getting assistance from [place] care providers as he was previously in [employer].  With permission, able to discuss the concerns related to patient's severe wounds.  Discussed how patient's essentially bed bound status and use of only a wheelchair have caused development of an unstageable pressure injury to his sacrum and buttocks.  Patient has multiple other wounds noted as well.  Patient acknowledges this and noted that he was not taking care of himself as he showed.  Patient's wife, Derek [name], now inquiring how patient can "get better" and how to fix all of the patient's medical concerns noting that she does not "know anything but success". I expressed continuing appropriate medical therapies at this time though extremely concerned about patient's overall prognosis related to his multiple wounds, underlying multiple myeloma, and poor nutritional status.  Noted importance of continued conversations moving forward to best support what patient will want regarding medical care while balancing his quality of life.  Did inquire with patient if he was unable to make decisions for himself, who would he want to make medical decisions on his behalf.  Patient did note that he would want his spouse, Derek [name], to make medical decisions on his behalf.  Spent time providing emotional support reactive listening.  Noted palliative medicine team will continue to following with patient's  medical journey.  Updated IDT regarding discussion.  Patient being transferred to ICU for higher level of care.  Primary Diagnoses  Present on Admission:  Sepsis [facility])    Past Medical History:  Diagnosis Date   Arthritis    left knee    Cancer (HCC)    GERD (gastroesophageal reflux disease)    Hypercholesterolemia    Obesity    Urethritis    when he was in [employer]   Social History   Socioeconomic History   Marital status: Married    Spouse name: Not on file   Number of children: 2   Years of education: graduated high school   Highest education level: High school graduate  Occupational History   Occupation: retired  Tobacco Use   Smoking status: Never   Smokeless tobacco: Never  Vaping Use   Vaping status: Never Used  Substance and Sexual Activity   Alcohol use: No    Alcohol/week: 0.0 standard drinks of alcohol   Drug use: No   Sexual activity: Yes    Birth control/protection: Condom  Other Topics Concern   Not on file  Social History Narrative   Lives 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   Right handed   Social Drivers of Health   Financial Resource Strain: Low Risk  (12/24/2022)   Overall Financial Resource Strain (CARDIA)    Difficulty of Paying Living Expenses: Not hard at all  Food Insecurity: No Food Insecurity (07/29/2023)   Hunger Vital Sign    Worried About Running Out of Food in the Last Year: Never true    Ran Out of Food in the Last Year: Never true  Transportation Needs: No Transportation Needs (07/29/2023)   PRAPARE - Administrator, Civil Service (Medical): No    Lack of Transportation (Non-Medical): No  Physical Activity: Inactive (04/05/2023)   Exercise Vital Sign    Days of Exercise per Week: 0 days    Minutes of Exercise per Session: 0 min  Stress: Stress Concern Present (04/05/2023)   [employer] of Occupational Health - Occupational Stress Questionnaire    Feeling of Stress : To some extent  Social Connections: Socially Isolated (12/24/2022)   Social Connection and Isolation Panel [NHANES]    Frequency of Communication with Friends and Family: More than three times a week    Frequency of Social Gatherings with Friends and  Family: More than three times a week    Attends Religious Services: Never    Database administrator or Organizations: No    Attends Engineer, structural: Never    Marital Status: Separated   Family History  Problem Relation Age of Onset   Cancer Mother        breast   Diabetes Father    Scheduled Meds:  sodium chloride   Intravenous Once   Chlorhexidine Gluconate Cloth  6 each Topical Daily   desmopressin  10 mcg Nasal BID   docusate sodium  100 mg Oral BID   famotidine  10 mg Oral QHS   insulin aspart  1-3 Units Subcutaneous Q4H   sodium hypochlorite   Irrigation BID   Continuous Infusions:  sodium chloride Stopped (09/27/23 0226)   sodium chloride 150 mL/hr at 09/27/23 0935   norepinephrine (LEVOPHED) Adult infusion 2 mcg/min (09/27/23 1137)   piperacillin-tazobactam (ZOSYN)  IV 3.375 g (09/27/23 1119)   PRN Meds:.acetaminophen **OR** acetaminophen, menthol-cetylpyridinium, ondansetron **OR** ondansetron (ZOFRAN) IV, mouth rinse,  polyethylene glycol, senna-docusate No Known Allergies CBC:    Component Value Date/Time   WBC 4.2 09/27/2023 1351   HGB 7.3 (L) 09/27/2023 1351   HGB 11.8 (L) 08/27/2021 1028   HCT 22.4 (L) 09/27/2023 1351   HCT 36.0 (L) 08/27/2021 1028   PLT 217 09/27/2023 1351   PLT 203 08/27/2021 1028   MCV 96.6 09/27/2023 1351   MCV 98 (H) 08/27/2021 1028   NEUTROABS 2.5 09/27/2023 1351   NEUTROABS 6.1 08/27/2021 1028   LYMPHSABS 0.7 09/27/2023 1351   LYMPHSABS 2.2 08/27/2021 1028   MONOABS 0.3 09/27/2023 1351   EOSABS 0.0 09/27/2023 1351   EOSABS 0.1 08/27/2021 1028   BASOSABS 0.0 09/27/2023 1351   BASOSABS 0.0 08/27/2021 1028   Comprehensive Metabolic Panel:    Component Value Date/Time   NA 130 (L) 09/27/2023 1351   NA 138 08/27/2021 1028   K 4.8 09/27/2023 0515   CL 85 (L) 09/27/2023 0515   CO2 19 (L) 09/27/2023 0515   BUN 75 (H) 09/27/2023 0515   BUN 14 08/27/2021 1028   CREATININE 3.22 (H) 09/27/2023 0515   GLUCOSE 417 (H)  09/27/2023 0515   CALCIUM 7.7 (L) 09/27/2023 0515   AST 23 09/26/2023 1706   ALT 50 (H) 09/26/2023 1706   ALKPHOS 104 09/26/2023 1706   BILITOT 1.4 (H) 09/26/2023 1706   BILITOT 0.4 08/27/2021 1028   PROT 6.3 (L) 09/26/2023 1706   PROT 8.2 08/27/2021 1028   ALBUMIN 2.5 (L) 09/26/2023 1706   ALBUMIN 3.7 08/27/2021 1028    Physical Exam: Vital Signs: BP (!) 107/58   Pulse 93   Temp 98 F (36.7 C)   Resp 14   Ht 5\' 6"  (1.676 m)   Wt 108.9 kg   SpO2 96%   BMI 38.74 kg/m  SpO2: SpO2: 96 % O2 Device: O2 Device: Room Air O2 Flow Rate:   Intake/output summary:  Intake/Output Summary (Last 24 hours) at 09/27/2023 1603 Last data filed at 09/27/2023 1143 Gross per 24 hour  Intake 1368.05 ml  Output --  Net 1368.05 ml   LBM:   Baseline Weight: Weight: 108.9 kg Most recent weight: Weight: 108.9 kg  General: NAD, alert, chronically ill-appearing Cardiovascular: RRR Respiratory: no increased work of breathing noted, not in respiratory distress Skin: Multiple wounds noted in EMR review Neuro: Awake, interactive, pleasant          Palliative Performance Scale: 20%              Additional Data Reviewed: Recent Labs    09/27/23 0314 09/27/23 0515 09/27/23 0828 09/27/23 1351  WBC  --  6.4  --  4.2  HGB  --  7.0*  --  7.3*  PLT  --  208  --  217  NA 122* 115*  114* 126* 130*  BUN 87* 75*  --   --   CREATININE 3.56* 3.22*  --   --     Imaging: VAS [place] LOWER EXTREMITY VENOUS (DVT)  Lower Venous DVT Study  Patient Name:  REINHART SAULTERS  Date of Exam:   09/27/2023 Medical Rec #: [id number]       Accession #:    [id number] Date of Birth: 06-20-1946        Patient Gender: M Patient Age:   30 years Exam Location:  [facility] Procedure:      VAS [place] LOWER EXTREMITY VENOUS (DVT) Referring Phys: Brendolyn Patty  --------------------------------------------------------------------------------   Indications: Swelling.   Risk Factors: None  identified. Limitations: Body  habitus, poor ultrasound/tissue interface and patient positioning, immobility, pain tolerance. Comparison Study: No prior studies.  Performing Technologist: Chanda Busing RVT    Examination Guidelines: A complete evaluation includes B-mode imaging, spectral Doppler, color Doppler, and power Doppler as needed of all accessible portions of each vessel. Bilateral testing is considered an integral part of a complete examination. Limited examinations for reoccurring indications may be performed as noted. The reflux portion of the exam is performed with the patient in reverse Trendelenburg.     +---------+---------------+---------+-----------+----------+-------------------+ RIGHT    CompressibilityPhasicitySpontaneityPropertiesThrombus Aging      +---------+---------------+---------+-----------+----------+-------------------+ CFV      Full           Yes      Yes                                      +---------+---------------+---------+-----------+----------+-------------------+ SFJ      Full                                                             +---------+---------------+---------+-----------+----------+-------------------+ FV Prox  Full                                                             +---------+---------------+---------+-----------+----------+-------------------+ FV Mid                  Yes      Yes                                      +---------+---------------+---------+-----------+----------+-------------------+ FV Distal               Yes      Yes                                      +---------+---------------+---------+-----------+----------+-------------------+ PFV                                                   Not well visualized +---------+---------------+---------+-----------+----------+-------------------+ POP      Full           Yes      Yes                                       +---------+---------------+---------+-----------+----------+-------------------+ PTV                                                   Not well visualized +---------+---------------+---------+-----------+----------+-------------------+ PERO  Not well visualized +---------+---------------+---------+-----------+----------+-------------------+        +---------+---------------+---------+-----------+----------+-------------------+ LEFT     CompressibilityPhasicitySpontaneityPropertiesThrombus Aging      +---------+---------------+---------+-----------+----------+-------------------+ CFV      Full           Yes      Yes                                      +---------+---------------+---------+-----------+----------+-------------------+ SFJ      Full                                                             +---------+---------------+---------+-----------+----------+-------------------+ FV Prox  Full                                                             +---------+---------------+---------+-----------+----------+-------------------+ FV Mid   None           No       No                   Acute               +---------+---------------+---------+-----------+----------+-------------------+ FV DistalNone                                         Acute               +---------+---------------+---------+-----------+----------+-------------------+ PFV      Full                                                             +---------+---------------+---------+-----------+----------+-------------------+ POP                                                   Not well visualized +---------+---------------+---------+-----------+----------+-------------------+ PTV                                                   Not well  visualized +---------+---------------+---------+-----------+----------+-------------------+ PERO                                                  Not well visualized +---------+---------------+---------+-----------+----------+-------------------+             Summary: RIGHT:  - There is no evidence of deep vein thrombosis in the lower extremity. However, portions of this  examination were limited- see technologist comments above.   - No cystic structure found in the popliteal fossa.   LEFT: - Findings consistent with acute deep vein thrombosis involving the left femoral vein.    - No cystic structure found in the popliteal fossa.    *See table(s) above for measurements and observations.  Electronically signed by Lemar Livings MD on 09/27/2023 at 1:25:48 PM.      Final   CT CHEST ABDOMEN PELVIS WO CONTRAST CLINICAL DATA:  78 year old male with sepsis. History of multiple myeloma. * Tracking Code: BO *  EXAM: CT CHEST, ABDOMEN AND PELVIS WITHOUT CONTRAST  TECHNIQUE: Multidetector CT imaging of the chest, abdomen and pelvis was performed following the standard protocol without IV contrast.  RADIATION DOSE REDUCTION: This exam was performed according to the departmental dose-optimization program which includes automated exposure control, adjustment of the mA and/or kV according to patient size and/or use of iterative reconstruction technique.  COMPARISON:  PET-CT 04/14/2023.  FINDINGS: CT CHEST FINDINGS  Cardiovascular: Heart size is normal. There is no significant pericardial fluid, thickening or pericardial calcification. There is aortic atherosclerosis, as well as atherosclerosis of the great vessels of the mediastinum and the coronary arteries, including calcified atherosclerotic plaque in the left main, left anterior descending, left circumflex and right coronary arteries.  Mediastinum/Nodes: No pathologically enlarged mediastinal or hilar lymph  nodes. Please note that accurate exclusion of hilar adenopathy is limited on noncontrast CT scans. Esophagus is unremarkable in appearance. No axillary lymphadenopathy.  Lungs/Pleura: There appears to be a combination of atelectasis and consolidation in the left lower lobe near the base. No pleural effusions. No definite suspicious appearing pulmonary nodules or masses are noted.  Musculoskeletal: Previously noted expansile lytic lesion in the anterolateral right sixth rib has regressed, now an ill-defined area of sclerosis. New area of sclerosis in the posterolateral left sixth rib could also represent a treated lesion.  CT ABDOMEN PELVIS FINDINGS  Hepatobiliary: No definite suspicious cystic or solid hepatic lesions are confidently identified on today's noncontrast CT examination. Intermediate attenuation material lying dependently in the gallbladder, presumably biliary sludge. Gallbladder is moderately distended. No definite pericholecystic fluid or surrounding inflammatory changes.  Pancreas: No definite pancreatic mass or peripancreatic fluid collections or inflammatory changes are noted on today's noncontrast CT examination.  Spleen: Unremarkable.  Adrenals/Urinary Tract: Nonobstructive calculi measuring 2-3 mm are noted in the left renal collecting system. No additional calculi are noted within the right renal collecting system, along the course of either ureter, or within the lumen of the urinary bladder. No hydroureteronephrosis. Multiple low-attenuation lesions in both kidneys, incompletely characterized on today's noncontrast CT examination, but statistically likely to represent cysts (no imaging follow-up recommended), largest of which is in the anterior aspect of the interpolar region of the right kidney measuring up to 6.8 cm in diameter. 12 mm exophytic intermediate attenuation (30 HU) lesion in the upper pole of the left kidney (axial image 58 of series 2) also  noted, considered indeterminate. No hydroureteronephrosis. Urinary bladder is nearly completely decompressed around an indwelling Foley balloon catheter. Small amount of gas non dependently within the lumen of the urinary bladder, presumably iatrogenic.  Stomach/Bowel: Unenhanced appearance of the stomach is normal. There is no pathologic dilatation of small bowel or colon. Normal appendix.  Vascular/Lymphatic: Atherosclerotic calcifications are noted in the abdominal aorta and pelvic vasculature. No lymphadenopathy noted in the abdomen or pelvis.  Reproductive: Prostate gland and seminal vesicles are unremarkable in appearance.  Other: No significant volume of  ascites.  No pneumoperitoneum.  Musculoskeletal: Status post PLIF at L4-L5 with interbody cage at the L4-L5 interspace. Advanced degenerative changes of osteoarthritis in the left hip joint. There are no aggressive appearing lytic or blastic lesions noted in the visualized portions of the skeleton.  IMPRESSION: 1. Atelectasis and consolidation in the left lower lobe concerning for pneumonia. 2. No other potential source of sepsis identified on today's noncontrast CT examination. 3. Regression of previously noted expansile lytic lesion in the anterolateral right sixth rib, now with an ill-defined area of sclerosis. New area of sclerosis in the posterolateral left sixth rib could also represent a treated lesion. 4. 12 mm exophytic intermediate attenuation lesion in the upper pole of the left kidney, considered indeterminate. Follow-up nonemergent outpatient abdominal MRI with and without IV gadolinium is recommended in the near future to provide definitive characterization and exclude neoplasm. 5. Nonobstructive calculi in the left renal collecting system measuring 2-3 mm. No ureteral stones or findings of urinary tract obstruction. 6. Aortic atherosclerosis, in addition to left main and three-vessel coronary artery  disease.  Aortic Atherosclerosis (ICD10-I70.0).  Electronically Signed   By: Trudie Reed M.D.   On: 09/27/2023 06:55    I personally reviewed recent imaging.   Palliative Care Assessment and Plan Summary of Established Goals of Care and Medical Treatment Preferences   Patient is a 78 year old male with a past medical history of multiple myeloma on chemotherapy, dyslipidemia, and GERD who was admitted on 09/27/2023 for management of increasing lower extremity edema, peritoneal weeping sores, and worsening generalized weakness for the past 1 to 2 weeks.  Upon admission patient found to be in septic shock due to severe dehydration and possible sepsis, AKI, lactic acidosis, and to have multiple electrolyte abnormalities.  Palliative medicine team consulted to assist with complex medical decision making.  # Complex medical decision making/goals of care  -Extensive conversation with patient while wife present at bedside in ER.  Spent time discussing patient's current medical illnesses and comorbidities.  Spent time learning about patient's status at home; he was essentially bed bound with poor nutritional status.  Expressed concern to patient and Derek [name] about the seriousness of patient's multiple wounds in the setting of his known history of multiple myeloma now with multisystem organ failure.  Patient and significant other are very overwhelmed with this information.  Spent time providing emotional support as able.  Noted palliative medicine team and continue to engage in conversations as able moving forward.  Encouraged discussions regarding balancing patient's medical interventions with quality of life.  -Patient did state that he was unable to speak for himself, he would want his significant other Derek [name] to make medical decisions on his behalf.  -  Code Status: Full Code   # Psycho-social/Spiritual Support:  - Support System: Significant other-[name] -Patient is a veteran Garment/textile technologist) and [facility]  # Discharge Planning:  To Be Determined  Thank you for allowing the palliative care team to participate in the care [facility].  Alvester Morin, DO Palliative Care Provider PMT # [phone]  If patient remains symptomatic despite maximum doses, please call PMT at [phone] between 0700 and 1900. Outside of these hours, please call attending, as PMT does not have night coverage.  Personally spent 80 minutes in patient care including extensive chart review (labs, imaging, progress/consult notes, vital signs), medically appropraite exam, discussed with treatment team, education to patient, family, and staff, documenting clinical information, medication review and management, coordination of care, and  available advanced directive documents.

## 2023-09-27 NOTE — Progress Notes (Signed)
Bilateral lower extremity venous duplex has been completed. Preliminary results can be found in CV Proc through chart review.  Results were given to Dr. Katrinka Blazing.  09/27/23 8:52 AM Olen Cordial RVT

## 2023-09-27 NOTE — ED Notes (Signed)
Hypertonic solution stopped per MD Haroldine Laws verbal order, MD has called this RN back and updated on plan of care of changing abx, adding fluid bolus and pressor, and CT for pt, based off RN verbal assessment given to provider via phone. Shortly after MD to room to assess pt and same findings noted.

## 2023-09-27 NOTE — Progress Notes (Addendum)
09/27/2023 Interim note Seen and examined being eval'd by wound care  He has weeping wounds on legs and deep sacral wound foul smelling. MM are dry  Wheelchair bound baseline Refused rehab last admit  Labs/imaging reviewed  Septic shock with multiorgan failure in wheelchair-bound patient with severe leg wounds, third spacing, and multiple myeloma on chemo  - Check urine/serum osms - Hypertonic saline goal ~120, IV hydration - Levo for MAP 65 - Broad spectrum abx - Palliative to see, if full scope desired he will need surgical consult and hydrotherapy at very least per WOC  I think he is nearing EOL  My cc time 40 mins  Update LE duplex + for DVT, with H/H drop hold on [facility] for now until we figure out if H/H stable.  Also sodium rose ?12 points with 30 cc hypertonic?  Something seems off about this. Hypertonic is off, we are still checking q4h sodiums, would like to keep them in low 120s today if able.

## 2023-09-27 NOTE — ED Notes (Signed)
RN has [facility] pharmacy about medication, waiting response

## 2023-09-27 NOTE — Progress Notes (Signed)
eLink Physician-Brief Progress Note Patient Name: Derek [name] DOB: 1946/02/17 MRN: [id number]   Date of Service  09/27/2023  HPI/Events of Note  Na 114>129 in the last 12 hours  eICU Interventions  DC NS gtt Trend Na q6 h     Intervention Category Intermediate Interventions: Electrolyte abnormality - evaluation and management  Derek [name] 09/27/2023, 8:13 PM

## 2023-09-27 NOTE — Consult Note (Addendum)
 Derek [name] [birth year]  [id number].    Requesting MD: Levon Hedger, MD  Chief Complaint/Reason for Consult: necrotic sacral wound, sepsis   HPI:  Derek [name] is a 78 y/o M with PMH multiple myeloma on chemo, HLD, GERD, and sacral wound who presented due to family concern for worsening generalized weakness and lower extremity wounds. According to the patient he has been unable to walk for about one year and has relied on his wife for assistance with personal care and mobility. At baseline he stays in the bed or a motor wheelchair.  According to chart review it appears he has been much more immobile ever since he underwent ORIF for left periprosthetic femur fracture 11/2022. He tells me he has had a wound on his buttocks for a year but it has been worse in the last month as a result of laying in his own urine and stool. He denies current use of blood thinners and confirms his multiple myeloma is managed through the [place] and he is on PO chemotherapy. He tells me that his wife has starting managing his medications. He denies a known history of abdominal surgery.   ROS: Review of Systems  All other systems reviewed and are negative.   Family History  Problem Relation Age of Onset   Cancer Mother        breast   Diabetes Father     Past Medical History:  Diagnosis Date   Arthritis    left knee   Cancer (HCC)    GERD (gastroesophageal reflux disease)    Hypercholesterolemia    Obesity    Urethritis    when he was in [employer]    Past Surgical History:  Procedure Laterality Date   ANTERIOR CERVICAL DECOMP/DISCECTOMY FUSION N/A 05/06/2021   Procedure: Anterior Cervical Discectomy Fusion Cervical Three-Four, Cervical Four-Five;  Surgeon: Bedelia Person, MD;  Location: [facility] OR;  Service: Neurosurgery;  Laterality: N/A;   COLONOSCOPY     IR BONE MARROW BIOPSY & ASPIRATION  04/27/2023   ORIF FEMUR FRACTURE Left 12/06/2022   Procedure: OPEN REDUCTION INTERNAL FIXATION (ORIF) DISTAL  FEMUR FRACTURE;  Surgeon: Roby Lofts, MD;  Location: [facility] OR;  Service: Orthopedics;  Laterality: Left;   TOTAL KNEE ARTHROPLASTY Left 07/22/2020   Procedure: LEFT TOTAL KNEE ARTHROPLASTY;  Surgeon: Kathryne Hitch, MD;  Location: [facility] OR;  Service: Orthopedics;  Laterality: Left;   TRANSFORAMINAL LUMBAR INTERBODY FUSION (TLIF) WITH PEDICLE SCREW FIXATION 1 LEVEL N/A 10/20/2021   Procedure: Transforaminal Lumbar Interbody Fusion Lumbar Four-Five;  Surgeon: Bedelia Person, MD;  Location: [facility] OR;  Service: Neurosurgery;  Laterality: N/A;  Transforaminal Lumbar Interbody Fusion Lumbar Four-Five    Social History:  reports that he has never smoked. He has never used smokeless tobacco. He reports that he does not drink alcohol and does not use drugs.  Allergies: No Known Allergies  Medications Prior to Admission  Medication Sig Dispense Refill   acetaminophen (TYLENOL) 325 MG tablet Take 650 mg by mouth every 6 (six) hours as needed for moderate pain (pain score 4-6).     acyclovir (ZOVIRAX) 800 MG tablet Take 400 mg by mouth 2 (two) times daily.     aspirin EC 81 MG tablet Take 1 tablet (81 mg total) by mouth daily. Swallow whole. 30 tablet 1   cholecalciferol (VITAMIN D) 1000 UNITS tablet Take 1,000 Units by mouth daily.     lenalidomide (REVLIMID) 25 MG capsule Take 25 mg by mouth daily.  [employer] Auth # N/A     Date Obtained 09-19-2023     Nutritional Supplements (PROSTATE PO) Take 1 tablet by mouth 2 (two) times daily.     trolamine salicylate (ASPERCREME) 10 % cream Apply 1 application topically as needed for muscle pain.     atorvastatin (LIPITOR) 40 MG tablet Take 1 tablet (40 mg total) by mouth at bedtime. (Patient not taking: Reported on 09/26/2023) 30 tablet 1   clopidogrel (PLAVIX) 75 MG tablet Take 1 tablet (75 mg total) by mouth daily. (Patient not taking: Reported on 09/26/2023) 21 tablet 0   lenalidomide (REVLIMID) 15 MG capsule Take 25 mg by mouth daily. [employer] Auth #      Date  Obtained (Patient not taking: Reported on 09/26/2023)       Physical Exam: Blood pressure (!) 107/58, pulse 93, temperature 98 F (36.7 C), resp. rate 14, height 5\' 6"  (1.676 m), weight 108.9 kg, SpO2 96%. General: Pleasant black male laying on hospital bed, appears stated age, NAD. Chronically ill appearing HEENT: head -normocephalic, atraumatic; Eyes: PERRLA, no conjunctival injection Neck- Trachea is midline CV- RRR, normal S1/S2, no M/R/G, there is edema of the upper and lower extremities Pulm- breathing is non-labored  Abd- soft, NT/ND, no hernias  GU- necrotic sacral ulcer with soft, black eschar that is boggy. There is palpable sacral or coccygeal bone but it is not visible. Pressure wounds involve the bilateral buttocks, posterior thighs, and perianal region, anus is in tact. There is maceration and skin breakdown of the scrotal tissue and inguinal creases.        MSK- UE/LE edema, there are pressure injuries of the bilateral lower extremities. I placed pictures in the media tab. No evidence of cellulitis.  Neuro- nonfocal exam Psych- Alert and Oriented x4 (person, [place], February 2025, oriented to situation)   Results for orders placed or performed during the hospital encounter of 09/26/23 (from the past 48 hours)  CBC with Differential     Status: Abnormal   Collection Time: 09/26/23  5:06 PM  Result Value Ref Range   WBC 8.5 4.0 - 10.5 K/uL   RBC 2.96 (L) 4.22 - 5.81 MIL/uL   Hemoglobin 9.1 (L) 13.0 - 17.0 g/dL   HCT 16.1 (L) 09.6 - 04.5 %   MCV 95.6 80.0 - 100.0 fL   MCH 30.7 26.0 - 34.0 pg   MCHC 32.2 30.0 - 36.0 g/dL   RDW 40.9 (H) 81.1 - 91.4 %   Platelets 293 150 - 400 K/uL   nRBC 0.0 0.0 - 0.2 %   Neutrophils Relative % 78 %   Neutro Abs 6.7 1.7 - 7.7 K/uL   Lymphocytes Relative 12 %   Lymphs Abs 1.0 0.7 - 4.0 K/uL   Monocytes Relative 5 %   Monocytes Absolute 0.4 0.1 - 1.0 K/uL   Eosinophils Relative 0 %   Eosinophils Absolute 0.0 0.0 - 0.5 K/uL    Basophils Relative 0 %   Basophils Absolute 0.0 0.0 - 0.1 K/uL   Immature Granulocytes 5 %   Abs Immature Granulocytes 0.38 (H) 0.00 - 0.07 K/uL    Comment: Performed at [facility], [address], [place], [place] [address]  Comprehensive metabolic panel     Status: Abnormal   Collection Time: 09/26/23  5:06 PM  Result Value Ref Range   Sodium 117 (LL) 135 - 145 mmol/L    Comment: CRITICAL RESULT CALLED TO, READ BACK BY AND VERIFIED WITH L.SINCLAIR, RN AT  1800 ON 02.10.25 BY N.THOMPSON    Potassium 6.7 (HH) 3.5 - 5.1 mmol/L    Comment: CRITICAL RESULT CALLED TO, READ BACK BY AND VERIFIED WITH L.SINCLAIR, RN AT 1800 ON 02.10.25 BY N.THOMPSON    Chloride 83 (L) 98 - 111 mmol/L   CO2 17 (L) 22 - 32 mmol/L   Glucose, Bld 102 (H) 70 - 99 mg/dL    Comment: Glucose reference range applies only to samples taken after fasting for at least 8 hours.   BUN 92 (H) 8 - 23 mg/dL   Creatinine, Ser 7.84 (H) 0.61 - 1.24 mg/dL   Calcium 9.1 8.9 - 69.6 mg/dL   Total Protein 6.3 (L) 6.5 - 8.1 g/dL   Albumin 2.5 (L) 3.5 - 5.0 g/dL   AST 23 15 - 41 U/L   ALT 50 (H) 0 - 44 U/L   Alkaline Phosphatase 104 38 - 126 U/L   Total Bilirubin 1.4 (H) 0.0 - 1.2 mg/dL   GFR, Estimated 13 (L) >60 mL/min    Comment: (NOTE) Calculated using the CKD-EPI Creatinine Equation (2021)    Anion gap 17 (H) 5 - 15    Comment: Performed at [facility], [address], [place], [place] [address]  Magnesium     Status: Abnormal   Collection Time: 09/26/23  5:06 PM  Result Value Ref Range   Magnesium 2.5 (H) 1.7 - 2.4 mg/dL    Comment: Performed at [facility], [address] [place], [place] [address]  Brain natriuretic peptide     Status: Abnormal   Collection Time: 09/26/23  5:06 PM  Result Value Ref Range   B Natriuretic Peptide 198.3 (H) 0.0 - 100.0 pg/mL    Comment: Performed at [facility], [address] [place], [place] [address]   TSH     Status: Abnormal   Collection Time: 09/26/23  5:06 PM  Result Value Ref Range   TSH 0.286 (L) 0.350 - 4.500 uIU/mL    Comment: Performed by a 3rd Generation assay with a functional sensitivity of <=0.01 uIU/mL. Performed at [facility], [address], [place], [place] [address]   Urinalysis, Routine w reflex microscopic -Urine, Clean Catch     Status: Abnormal   Collection Time: 09/26/23  8:06 PM  Result Value Ref Range   Color, Urine AMBER (A) YELLOW    Comment: BIOCHEMICALS MAY BE AFFECTED BY COLOR   APPearance CLOUDY (A) CLEAR   Specific Gravity, Urine 1.020 1.005 - 1.030   pH 5.0 5.0 - 8.0   Glucose, UA NEGATIVE NEGATIVE mg/dL   Hgb urine dipstick SMALL (A) NEGATIVE   Bilirubin Urine NEGATIVE NEGATIVE   Ketones, ur NEGATIVE NEGATIVE mg/dL   Protein, ur 30 (A) NEGATIVE mg/dL   Nitrite NEGATIVE NEGATIVE   Leukocytes,Ua SMALL (A) NEGATIVE   RBC / HPF >50 0 - 5 RBC/hpf   WBC, UA >50 0 - 5 WBC/hpf   Bacteria, UA MANY (A) NONE SEEN   Squamous Epithelial / HPF 0-5 0 - 5 /HPF   Mucus PRESENT    Hyaline Casts, UA PRESENT    Non Squamous Epithelial 0-5 (A) NONE SEEN    Comment: Performed at [facility], [address], [place], [place] [address]  Sodium, urine, random     Status: None   Collection Time: 09/26/23  8:06 PM  Result Value Ref Range   Sodium, Ur <10 mmol/L    Comment: Performed at [facility], [address], [place], [place] [address]  Osmolality, urine     Status: Abnormal   Collection Time: 09/26/23  8:06 PM  Result Value Ref Range   Osmolality, Ur 298 (L) 300 - 900 mOsm/kg    Comment: Performed at [facility], [address] [place], [place] [address]  CBG monitoring, ED     Status: None   Collection Time: 09/26/23  9:28 PM  Result Value Ref Range   Glucose-Capillary 88 70 - 99 mg/dL    Comment: Glucose reference range applies only to samples taken after fasting for at least 8 hours.    Comment 1 Notify RN   Sodium     Status: Abnormal   Collection Time: 09/26/23  9:55 PM  Result Value Ref Range   Sodium 120 (L) 135 - 145 mmol/L    Comment: Performed at [facility], [address], [place], [place] [address]  Sodium, urine, random     Status: None   Collection Time: 09/26/23  9:57 PM  Result Value Ref Range   Sodium, Ur <10 mmol/L    Comment: Performed at [facility], [address], [place], [place] [address]  Culture, blood (routine x 2)     Status: None (Preliminary result)   Collection Time: 09/26/23 10:25 PM   Specimen: BLOOD  Result Value Ref Range   Specimen Description      BLOOD LEFT ANTECUBITAL Performed at [facility], [address], [place], [place] [address]    Special Requests      BOTTLES DRAWN AEROBIC AND ANAEROBIC Blood Culture results may not be optimal due to an inadequate volume of blood received in culture bottles Performed at [facility], [address], [place], [place] [address]    Culture      NO GROWTH < 12 HOURS Performed at [facility], [address] [place] [address] [place], [place] [address]    Report Status PENDING   Culture, blood (routine x 2)     Status: None (Preliminary result)   Collection Time: 09/26/23 10:30 PM   Specimen: BLOOD  Result Value Ref Range   Specimen Description      BLOOD BLOOD RIGHT ARM Performed at [facility], [address] [place], [place] [address]    Special Requests      BOTTLES DRAWN AEROBIC AND ANAEROBIC Blood Culture results may not be optimal due to an inadequate volume of blood received in culture bottles Performed at [facility], [address] [place], [place] [address]    Culture      NO GROWTH < 12 HOURS Performed at [facility], [address], [place], [place] [address]    Report Status PENDING   Basic metabolic panel     Status: Abnormal   Collection Time:  09/26/23 10:32 PM  Result Value Ref Range   Sodium 120 (L) 135 - 145 mmol/L   Potassium 5.7 (H) 3.5 - 5.1 mmol/L   Chloride 87 (L) 98 - 111 mmol/L   CO2 19 (L) 22 - 32 mmol/L   Glucose, Bld 82 70 - 99 mg/dL    Comment: Glucose reference range applies only to samples taken after fasting for at least 8 hours.   BUN 94 (H) 8 - 23 mg/dL   Creatinine, Ser 0.27 (H) 0.61 - 1.24 mg/dL   Calcium 8.8 (L) 8.9 - 10.3 mg/dL   GFR, Estimated  14 (L) >60 mL/min    Comment: (NOTE) Calculated using the CKD-EPI Creatinine Equation (2021)    Anion gap 14 5 - 15    Comment: Performed at [facility], [address] [place], [place] [address]  MRSA Next Gen by PCR, Nasal     Status: None   Collection Time: 09/26/23 10:40 PM   Specimen: Nasal Mucosa; Nasal Swab  Result Value Ref Range   MRSA by PCR Next Gen NOT DETECTED NOT DETECTED    Comment: (NOTE) The GeneXpert MRSA Assay (FDA approved for NASAL specimens only), is one component of a comprehensive MRSA colonization surveillance program. It is not intended to diagnose MRSA infection nor to guide or monitor treatment for MRSA infections. Test performance is not FDA approved in patients less than 36 years old. Performed at [facility], [address], [place], [place] [address]   CBG monitoring, ED     Status: None   Collection Time: 09/26/23 10:46 PM  Result Value Ref Range   Glucose-Capillary 87 70 - 99 mg/dL    Comment: Glucose reference range applies only to samples taken after fasting for at least 8 hours.  I-Stat Lactic Acid     Status: Abnormal   Collection Time: 09/26/23 10:48 PM  Result Value Ref Range   Lactic Acid, Venous 3.0 (HH) 0.5 - 1.9 mmol/L   Comment NOTIFIED PHYSICIAN   CBG monitoring, ED     Status: None   Collection Time: 09/26/23 11:55 PM  Result Value Ref Range   Glucose-Capillary 74 70 - 99 mg/dL    Comment: Glucose reference range applies only to samples taken after fasting for  at least 8 hours.  Blood gas, venous (at [facility] and [facility])     Status: Abnormal   Collection Time: 09/27/23 12:11 AM  Result Value Ref Range   pH, Ven 7.38 7.25 - 7.43   pCO2, Ven 37 (L) 44 - 60 mmHg   pO2, Ven 54 (H) 32 - 45 mmHg   Bicarbonate 21.9 20.0 - 28.0 mmol/L   Acid-base deficit 2.9 (H) 0.0 - 2.0 mmol/L   O2 Saturation 88.2 %   Patient temperature 37.0     Comment: Performed at [facility], [address], [place], [place] [address]  I-Stat Lactic Acid     Status: Abnormal   Collection Time: 09/27/23 12:24 AM  Result Value Ref Range   Lactic Acid, Venous 2.0 (HH) 0.5 - 1.9 mmol/L   Comment NOTIFIED PHYSICIAN   Blood gas, arterial     Status: Abnormal   Collection Time: 09/27/23  1:08 AM  Result Value Ref Range   Delivery systems ROOM AIR    pH, Arterial 7.41 7.35 - 7.45   pCO2 arterial 33 32 - 48 mmHg   pO2, Arterial 62 (L) 83 - 108 mmHg   Bicarbonate 21.1 20.0 - 28.0 mmol/L   Acid-base deficit 3.0 (H) 0.0 - 2.0 mmol/L   O2 Saturation 98.7 %   Patient temperature 36.3    Collection site LEFT RADIAL    Drawn by [id number]    Allens test (pass/fail) PASS PASS    Comment: Performed at [facility], [address], [place], [place] [address]  CBG monitoring, ED     Status: None   Collection Time: 09/27/23  3:10 AM  Result Value Ref Range   Glucose-Capillary 83 70 - 99 mg/dL    Comment: Glucose reference range applies only to samples taken after  fasting for at least 8 hours.  Basic metabolic panel     Status: Abnormal   Collection Time: 09/27/23  3:14 AM  Result Value Ref Range   Sodium 122 (L) 135 - 145 mmol/L   Potassium 5.4 (H) 3.5 - 5.1 mmol/L   Chloride 89 (L) 98 - 111 mmol/L   CO2 19 (L) 22 - 32 mmol/L   Glucose, Bld 97 70 - 99 mg/dL    Comment: Glucose reference range applies only to samples taken after fasting for at least 8 hours.   BUN 87 (H) 8 - 23 mg/dL   Creatinine, Ser 2.95 (H) 0.61 - 1.24 mg/dL   Calcium 8.5 (L) 8.9 - 10.3  mg/dL   GFR, Estimated 17 (L) >60 mL/min    Comment: (NOTE) Calculated using the CKD-EPI Creatinine Equation (2021)    Anion gap 14 5 - 15    Comment: Performed at [facility], [address], [place], [place] [address]  Phosphorus     Status: Abnormal   Collection Time: 09/27/23  3:14 AM  Result Value Ref Range   Phosphorus 5.5 (H) 2.5 - 4.6 mg/dL    Comment: Performed at [facility], [address] [place], [place] [address]  CBG monitoring, ED     Status: None   Collection Time: 09/27/23  5:10 AM  Result Value Ref Range   Glucose-Capillary 81 70 - 99 mg/dL    Comment: Glucose reference range applies only to samples taken after fasting for at least 8 hours.  Basic metabolic panel     Status: Abnormal   Collection Time: 09/27/23  5:15 AM  Result Value Ref Range   Sodium 114 (LL) 135 - 145 mmol/L    Comment: DELTA CHECK NOTED CRITICAL RESULT CALLED TO, READ BACK BY AND VERIFIED WITH ORED RN @ [phone] ON 09/27/2023 BY MTA    Potassium 4.8 3.5 - 5.1 mmol/L   Chloride 85 (L) 98 - 111 mmol/L   CO2 19 (L) 22 - 32 mmol/L   Glucose, Bld 417 (H) 70 - 99 mg/dL    Comment: Glucose reference range applies only to samples taken after fasting for at least 8 hours.   BUN 75 (H) 8 - 23 mg/dL   Creatinine, Ser 2.95 (H) 0.61 - 1.24 mg/dL   Calcium 7.7 (L) 8.9 - 10.3 mg/dL   GFR, Estimated 19 (L) >60 mL/min    Comment: (NOTE) Calculated using the CKD-EPI Creatinine Equation (2021)    Anion gap 10 5 - 15    Comment: Performed at [facility], [address] [place], [place] [address]  CBC with Differential/Platelet     Status: Abnormal   Collection Time: 09/27/23  5:15 AM  Result Value Ref Range   WBC 6.4 4.0 - 10.5 K/uL   RBC 2.18 (L) 4.22 - 5.81 MIL/uL   Hemoglobin 7.0 (L) 13.0 - 17.0 g/dL   HCT 24.4 (L) 01.0 - 27.2 %   MCV 97.2 80.0 - 100.0 fL   MCH 32.1 26.0 - 34.0 pg   MCHC 33.0 30.0 - 36.0 g/dL   RDW 53.6 (H) 64.4 - 03.4 %    Platelets 208 150 - 400 K/uL   nRBC 0.0 0.0 - 0.2 %   Neutrophils Relative % 66 %   Neutro Abs 4.5 1.7 - 7.7 K/uL   Band Neutrophils 5 %   Lymphocytes Relative 16 %   Lymphs Abs 1.0 0.7 - 4.0 K/uL   Monocytes  Relative 10 %   Monocytes Absolute 0.6 0.1 - 1.0 K/uL   Eosinophils Relative 2 %   Eosinophils Absolute 0.1 0.0 - 0.5 K/uL   Basophils Relative 0 %   Basophils Absolute 0.0 0.0 - 0.1 K/uL   WBC Morphology VACUOLATED NEUTROPHILS    Metamyelocytes Relative 1 %   Abs Immature Granulocytes 0.10 (H) 0.00 - 0.07 K/uL   Burr Cells PRESENT     Comment: Performed at [facility], [address], [place], [place] [address]  Magnesium     Status: None   Collection Time: 09/27/23  5:15 AM  Result Value Ref Range   Magnesium 1.9 1.7 - 2.4 mg/dL    Comment: Performed at [facility], [address], [place], [place] [address]  Vitamin B12     Status: Abnormal   Collection Time: 09/27/23  5:15 AM  Result Value Ref Range   Vitamin B-12 >7,500 (H) 180 - 914 pg/mL    Comment: RESULT CONFIRMED BY MANUAL DILUTION (NOTE) This assay is not validated for testing neonatal or myeloproliferative syndrome specimens for Vitamin B12 levels. Performed at [facility], [address], [place], [place] [address]   Folate     Status: None   Collection Time: 09/27/23  5:15 AM  Result Value Ref Range   Folate 6.1 >5.9 ng/mL    Comment: Performed at [facility], [address] [place], [place] [address]  Iron and TIBC     Status: Abnormal   Collection Time: 09/27/23  5:15 AM  Result Value Ref Range   Iron 13 (L) 45 - 182 ug/dL   TIBC 401 (L) 027 - 253 ug/dL   Saturation Ratios 10 (L) 17.9 - 39.5 %   UIBC 117 ug/dL    Comment: Performed at [facility], [address], [place], [place] [address]  Ferritin     Status: Abnormal   Collection Time: 09/27/23  5:15 AM  Result Value Ref Range   Ferritin  1,914 (H) 24 - 336 ng/mL    Comment: Performed at [facility], [address], [place], [place] [address]  D-dimer, quantitative     Status: Abnormal   Collection Time: 09/27/23  5:15 AM  Result Value Ref Range   D-Dimer, Quant 1.89 (H) 0.00 - 0.50 ug/mL-FEU    Comment: (NOTE) At the manufacturer cut-off value of 0.5 g/mL FEU, this assay has a negative predictive value of 95-100%.This assay is intended for use in conjunction with a clinical pretest probability (PTP) assessment model to exclude pulmonary embolism (PE) and deep venous thrombosis (DVT) in outpatients suspected of PE or DVT. Results should be correlated with clinical presentation. Performed at [facility], [address] [place], [place] [address]   Sodium     Status: Abnormal   Collection Time: 09/27/23  5:15 AM  Result Value Ref Range   Sodium 115 (LL) 135 - 145 mmol/L    Comment: CRITICAL RESULT CALLED TO, READ BACK BY AND VERIFIED WITH PERY, A. RN AT [phone] 09/27/2023 CONDRADA J. Performed at [facility], [address] [place], [place] [address]   CBG monitoring, ED     Status: None   Collection Time: 09/27/23  7:57 AM  Result Value Ref Range   Glucose-Capillary 83 70 - 99 mg/dL    Comment: Glucose reference range applies only to samples taken after fasting for at least 8 hours.  CBG monitoring, ED  Status: None   Collection Time: 09/27/23  8:05 AM  Result Value Ref Range   Glucose-Capillary 80 70 - 99 mg/dL    Comment: Glucose reference range applies only to samples taken after fasting for at least 8 hours.  Sodium     Status: Abnormal   Collection Time: 09/27/23  8:28 AM  Result Value Ref Range   Sodium 126 (L) 135 - 145 mmol/L    Comment: DELTA CHECK NOTED Performed at [facility], [address] [place], [place] [address]   CBG monitoring, ED     Status: None   Collection Time: 09/27/23 12:23 PM  Result Value Ref Range    Glucose-Capillary 77 70 - 99 mg/dL    Comment: Glucose reference range applies only to samples taken after fasting for at least 8 hours.   VAS [place] LOWER EXTREMITY VENOUS (DVT) Result Date: 09/27/2023  Lower Venous DVT Study Patient Name:  Derek [name]  Date of Exam:   09/27/2023 Medical Rec #: [id number]       Accession #:    [id number] Date of Birth: [birth year]        Patient Gender: M Patient Age:   61 years Exam Location:  [facility] Procedure:      VAS [place] LOWER EXTREMITY VENOUS (DVT) Referring Phys: Brendolyn Patty --------------------------------------------------------------------------------  Indications: Swelling.  Risk Factors: None identified. Limitations: Body habitus, poor ultrasound/tissue interface and patient positioning, immobility, pain tolerance. Comparison Study: No prior studies. Performing Technologist: Chanda Busing RVT  Examination Guidelines: A complete evaluation includes B-mode imaging, spectral Doppler, color Doppler, and power Doppler as needed of all accessible portions of each vessel. Bilateral testing is considered an integral part of a complete examination. Limited examinations for reoccurring indications may be performed as noted. The reflux portion of the exam is performed with the patient in reverse Trendelenburg.  +---------+---------------+---------+-----------+----------+-------------------+ RIGHT    CompressibilityPhasicitySpontaneityPropertiesThrombus Aging      +---------+---------------+---------+-----------+----------+-------------------+ CFV      Full           Yes      Yes                                      +---------+---------------+---------+-----------+----------+-------------------+ SFJ      Full                                                             +---------+---------------+---------+-----------+----------+-------------------+ FV Prox  Full                                                              +---------+---------------+---------+-----------+----------+-------------------+ FV Mid                  Yes      Yes                                      +---------+---------------+---------+-----------+----------+-------------------+ FV Distal  Yes      Yes                                      +---------+---------------+---------+-----------+----------+-------------------+ PFV                                                   Not well visualized +---------+---------------+---------+-----------+----------+-------------------+ POP      Full           Yes      Yes                                      +---------+---------------+---------+-----------+----------+-------------------+ PTV                                                   Not well visualized +---------+---------------+---------+-----------+----------+-------------------+ PERO                                                  Not well visualized +---------+---------------+---------+-----------+----------+-------------------+   +---------+---------------+---------+-----------+----------+-------------------+ LEFT     CompressibilityPhasicitySpontaneityPropertiesThrombus Aging      +---------+---------------+---------+-----------+----------+-------------------+ CFV      Full           Yes      Yes                                      +---------+---------------+---------+-----------+----------+-------------------+ SFJ      Full                                                             +---------+---------------+---------+-----------+----------+-------------------+ FV Prox  Full                                                             +---------+---------------+---------+-----------+----------+-------------------+ FV Mid   None           No       No                   Acute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

## 2023-09-27 NOTE — ED Notes (Signed)
[place] PIV placed.

## 2023-09-27 NOTE — ED Notes (Signed)
Family is at bedside.

## 2023-09-27 NOTE — Care Plan (Signed)
Talked with Marisue Ivan with [facility] -- plan is for OR tomorrow.  -made NPO at [facility]  -confirmed w Dr. Katrinka Blazing, at this point, trying to optimize his blood counts; not starting systemic heparin  -cont GOC pending post op course    Tessie Fass MSN, AGACNP-BC [facility] Pulmonary/Critical Care Medicine 09/27/2023, 4:04 PM

## 2023-09-28 ENCOUNTER — Inpatient Hospital Stay (HOSPITAL_COMMUNITY): Payer: No Typology Code available for payment source

## 2023-09-28 ENCOUNTER — Encounter (HOSPITAL_COMMUNITY): Payer: Self-pay | Admitting: Pulmonary Disease

## 2023-09-28 ENCOUNTER — Inpatient Hospital Stay (HOSPITAL_COMMUNITY): Payer: No Typology Code available for payment source | Admitting: Certified Registered Nurse Anesthetist

## 2023-09-28 ENCOUNTER — Other Ambulatory Visit: Payer: Self-pay

## 2023-09-28 ENCOUNTER — Encounter (HOSPITAL_COMMUNITY)
Admission: EM | Disposition: A | Discharge status: Other Acute Inpatient Hospital | Payer: Self-pay | Source: Home / Self Care | Attending: Family Medicine

## 2023-09-28 DIAGNOSIS — D649 Anemia, unspecified: Secondary | ICD-10-CM

## 2023-09-28 DIAGNOSIS — I82402 Acute embolism and thrombosis of unspecified deep veins of left lower extremity: Secondary | ICD-10-CM

## 2023-09-28 DIAGNOSIS — K219 Gastro-esophageal reflux disease without esophagitis: Secondary | ICD-10-CM

## 2023-09-28 DIAGNOSIS — K660 Peritoneal adhesions (postprocedural) (postinfection): Secondary | ICD-10-CM

## 2023-09-28 DIAGNOSIS — R630 Anorexia: Secondary | ICD-10-CM

## 2023-09-28 DIAGNOSIS — Z515 Encounter for palliative care: Secondary | ICD-10-CM | POA: Diagnosis not present

## 2023-09-28 DIAGNOSIS — N179 Acute kidney failure, unspecified: Secondary | ICD-10-CM | POA: Diagnosis not present

## 2023-09-28 DIAGNOSIS — L89159 Pressure ulcer of sacral region, unspecified stage: Secondary | ICD-10-CM | POA: Diagnosis not present

## 2023-09-28 DIAGNOSIS — E871 Hypo-osmolality and hyponatremia: Secondary | ICD-10-CM | POA: Diagnosis not present

## 2023-09-28 DIAGNOSIS — L8942 Pressure ulcer of contiguous site of back, buttock and hip, stage 2: Secondary | ICD-10-CM | POA: Diagnosis not present

## 2023-09-28 DIAGNOSIS — A419 Sepsis, unspecified organism: Secondary | ICD-10-CM | POA: Diagnosis not present

## 2023-09-28 DIAGNOSIS — R4589 Other symptoms and signs involving emotional state: Secondary | ICD-10-CM | POA: Diagnosis not present

## 2023-09-28 HISTORY — PX: LAPAROSCOPIC LOOP COLOSTOMY: SHX6816

## 2023-09-28 LAB — BASIC METABOLIC PANEL
Anion gap: 8 (ref 5–15)
BUN: 69 mg/dL — ABNORMAL HIGH (ref 8–23)
CO2: 20 mmol/L — ABNORMAL LOW (ref 22–32)
Calcium: 8.6 mg/dL — ABNORMAL LOW (ref 8.9–10.3)
Chloride: 104 mmol/L (ref 98–111)
Creatinine, Ser: 1.81 mg/dL — ABNORMAL HIGH (ref 0.61–1.24)
GFR, Estimated: 38 mL/min — ABNORMAL LOW (ref >60)
Glucose, Bld: 103 mg/dL — ABNORMAL HIGH (ref 70–99)
Potassium: 4.5 mmol/L (ref 3.5–5.1)
Sodium: 132 mmol/L — ABNORMAL LOW (ref 135–145)

## 2023-09-28 LAB — POCT I-STAT 7, (LYTES, BLD GAS, ICA,H+H)
Acid-base deficit: 8 mmol/L — ABNORMAL HIGH (ref 0.0–2.0)
Bicarbonate: 19.1 mmol/L — ABNORMAL LOW (ref 20.0–28.0)
Calcium, Ion: 1.22 mmol/L (ref 1.15–1.40)
HCT: 26 % — ABNORMAL LOW (ref 39.0–52.0)
Hemoglobin: 8.8 g/dL — ABNORMAL LOW (ref 13.0–17.0)
O2 Saturation: 100 %
Potassium: 4.3 mmol/L (ref 3.5–5.1)
Sodium: 133 mmol/L — ABNORMAL LOW (ref 135–145)
TCO2: 20 mmol/L — ABNORMAL LOW (ref 22–32)
pCO2 arterial: 45.5 mm[Hg] (ref 32–48)
pH, Arterial: 7.231 — ABNORMAL LOW (ref 7.35–7.45)
pO2, Arterial: 308 mm[Hg] — ABNORMAL HIGH (ref 83–108)

## 2023-09-28 LAB — SODIUM
Sodium: 130 mmol/L — ABNORMAL LOW (ref 135–145)
Sodium: 133 mmol/L — ABNORMAL LOW (ref 135–145)

## 2023-09-28 LAB — GLUCOSE, CAPILLARY
Glucose-Capillary: 80 mg/dL (ref 70–99)
Glucose-Capillary: 84 mg/dL (ref 70–99)
Glucose-Capillary: 169 mg/dL — ABNORMAL HIGH (ref 70–99)
Glucose-Capillary: 133 mg/dL — ABNORMAL HIGH (ref 70–99)

## 2023-09-28 LAB — CBC
HCT: 26 % — ABNORMAL LOW (ref 39.0–52.0)
Hemoglobin: 8.4 g/dL — ABNORMAL LOW (ref 13.0–17.0)
MCH: 31.2 pg (ref 26.0–34.0)
MCHC: 32.3 g/dL (ref 30.0–36.0)
MCV: 96.7 fL (ref 80.0–100.0)
Platelets: 142 10*3/uL — ABNORMAL LOW (ref 150–400)
RBC: 2.69 MIL/uL — ABNORMAL LOW (ref 4.22–5.81)
RDW: 16.4 % — ABNORMAL HIGH (ref 11.5–15.5)
WBC: 3.7 10*3/uL — ABNORMAL LOW (ref 4.0–10.5)
nRBC: 0 % (ref 0.0–0.2)

## 2023-09-28 LAB — CALCIUM, IONIZED: Calcium, Ionized, Serum: 5 mg/dL (ref 4.5–5.6)

## 2023-09-28 LAB — HEMOGLOBIN AND HEMATOCRIT, BLOOD
HCT: 26.2 % — ABNORMAL LOW (ref 39.0–52.0)
Hemoglobin: 8.5 g/dL — ABNORMAL LOW (ref 13.0–17.0)

## 2023-09-28 LAB — PREPARE RBC (CROSSMATCH)

## 2023-09-28 SURGERY — CREATION, COLOSTOMY, LOOP, LAPAROSCOPIC
Anesthesia: General

## 2023-09-28 MED ORDER — PANTOPRAZOLE SODIUM 40 MG IV SOLR
40.0000 mg | Freq: Every day | INTRAVENOUS | Status: DC
  Administered 2023-09-28 – 2023-09-30 (×3): 40 mg via INTRAVENOUS
  Filled 2023-09-28 (×3): qty 10

## 2023-09-28 MED ORDER — PROPOFOL 10 MG/ML IV BOLUS
INTRAVENOUS | Status: DC | PRN
  Administered 2023-09-28: 140 mg via INTRAVENOUS

## 2023-09-28 MED ORDER — BUPIVACAINE LIPOSOME 1.3 % IJ SUSP
INTRAMUSCULAR | Status: AC
  Filled 2023-09-28: qty 20

## 2023-09-28 MED ORDER — LIDOCAINE HCL (CARDIAC) PF 100 MG/5ML IV SOSY
PREFILLED_SYRINGE | INTRAVENOUS | Status: DC | PRN
  Administered 2023-09-28: 60 mg via INTRAVENOUS

## 2023-09-28 MED ORDER — ESMOLOL HCL 100 MG/10ML IV SOLN
INTRAVENOUS | Status: AC
  Filled 2023-09-28: qty 10

## 2023-09-28 MED ORDER — ORAL CARE MOUTH RINSE
15.0000 mL | OROMUCOSAL | Status: DC
  Administered 2023-09-28 – 2023-09-29 (×8): 15 mL via OROMUCOSAL

## 2023-09-28 MED ORDER — DOCUSATE SODIUM 50 MG/5ML PO LIQD
100.0000 mg | Freq: Two times a day (BID) | ORAL | Status: DC
  Administered 2023-09-28 – 2023-09-29 (×2): 100 mg
  Filled 2023-09-28 (×2): qty 10

## 2023-09-28 MED ORDER — ORAL CARE MOUTH RINSE: 15.0000 mL | OROMUCOSAL | Status: DC | PRN

## 2023-09-28 MED ORDER — BUPIVACAINE-EPINEPHRINE 0.25% -1:200000 IJ SOLN
INTRAMUSCULAR | Status: DC | PRN
  Administered 2023-09-28: 40 mL

## 2023-09-28 MED ORDER — BUPIVACAINE-EPINEPHRINE 0.25% -1:200000 IJ SOLN
INTRAMUSCULAR | Status: AC
Start: 2023-09-28 — End: ?
  Filled 2023-09-28: qty 1

## 2023-09-28 MED ORDER — AMIODARONE LOAD VIA INFUSION
150.0000 mg | Freq: Once | INTRAVENOUS | Status: AC
  Administered 2023-09-28: 150 mg via INTRAVENOUS
  Filled 2023-09-28: qty 83.34

## 2023-09-28 MED ORDER — FENTANYL CITRATE PF 50 MCG/ML IJ SOSY
25.0000 ug | PREFILLED_SYRINGE | INTRAMUSCULAR | Status: DC | PRN
  Administered 2023-09-28: 50 ug via INTRAVENOUS
  Administered 2023-09-28: 100 ug via INTRAVENOUS
  Administered 2023-09-29: 50 ug via INTRAVENOUS
  Filled 2023-09-28: qty 2
  Filled 2023-09-28: qty 1
  Filled 2023-09-28: qty 2

## 2023-09-28 MED ORDER — FENTANYL CITRATE (PF) 100 MCG/2ML IJ SOLN
INTRAMUSCULAR | Status: AC
  Filled 2023-09-28: qty 2

## 2023-09-28 MED ORDER — FENTANYL CITRATE PF 50 MCG/ML IJ SOSY: 25.0000 ug | PREFILLED_SYRINGE | INTRAMUSCULAR | Status: DC | PRN

## 2023-09-28 MED ORDER — MIDAZOLAM HCL 2 MG/2ML IJ SOLN: 1.0000 mg | INTRAMUSCULAR | Status: DC | PRN

## 2023-09-28 MED ORDER — POLYETHYLENE GLYCOL 3350 17 G PO PACK
17.0000 g | PACK | Freq: Every day | ORAL | Status: DC
  Administered 2023-09-29: 17 g
  Filled 2023-09-28: qty 1

## 2023-09-28 MED ORDER — NOREPINEPHRINE 4 MG/250ML-% IV SOLN
0.0000 ug/min | INTRAVENOUS | Status: DC
  Administered 2023-09-28: 10 ug/min via INTRAVENOUS
  Administered 2023-09-29: 7 ug/min via INTRAVENOUS
  Administered 2023-09-30: 12 ug/min via INTRAVENOUS
  Administered 2023-09-30: 5 ug/min via INTRAVENOUS
  Administered 2023-09-30 – 2023-10-01 (×2): 7 ug/min via INTRAVENOUS
  Administered 2023-10-01: 13 ug/min via INTRAVENOUS
  Administered 2023-10-01: 9 ug/min via INTRAVENOUS
  Administered 2023-10-02: 3 ug/min via INTRAVENOUS
  Administered 2023-10-03: 11 ug/min via INTRAVENOUS
  Administered 2023-10-03: 3 ug/min via INTRAVENOUS
  Administered 2023-10-04 (×2): 14 ug/min via INTRAVENOUS
  Administered 2023-10-04: 9 ug/min via INTRAVENOUS
  Administered 2023-10-04: 17 ug/min via INTRAVENOUS
  Administered 2023-10-05: 7 ug/min via INTRAVENOUS
  Administered 2023-10-05: 6 ug/min via INTRAVENOUS
  Administered 2023-10-06: 5 ug/min via INTRAVENOUS
  Administered 2023-10-07: 2 ug/min via INTRAVENOUS
  Administered 2023-10-07: 1 ug/min via INTRAVENOUS
  Administered 2023-10-08: 6 ug/min via INTRAVENOUS
  Administered 2023-10-08: 3 ug/min via INTRAVENOUS
  Administered 2023-10-12: 2 ug/min via INTRAVENOUS
  Administered 2023-10-13 (×2): 3 ug/min via INTRAVENOUS
  Administered 2023-10-14: 4 ug/min via INTRAVENOUS
  Administered 2023-10-15: 6 ug/min via INTRAVENOUS
  Administered 2023-10-15: 5 ug/min via INTRAVENOUS
  Administered 2023-10-16 – 2023-10-17 (×2): 3 ug/min via INTRAVENOUS
  Filled 2023-09-28 (×30): qty 250

## 2023-09-28 MED ORDER — FAMOTIDINE 20 MG PO TABS: 20.0000 mg | ORAL_TABLET | Freq: Every day | ORAL | Status: DC

## 2023-09-28 MED ORDER — MIDAZOLAM HCL 5 MG/5ML IJ SOLN
INTRAMUSCULAR | Status: DC | PRN
  Administered 2023-09-28: 2 mg via INTRAVENOUS

## 2023-09-28 MED ORDER — FENTANYL CITRATE (PF) 100 MCG/2ML IJ SOLN
INTRAMUSCULAR | Status: DC | PRN
  Administered 2023-09-28: 100 ug via INTRAVENOUS

## 2023-09-28 MED ORDER — DEXMEDETOMIDINE HCL IN NACL 400 MCG/100ML IV SOLN
0.0000 ug/kg/h | INTRAVENOUS | Status: DC
Start: 2023-09-28 — End: 2023-09-29
  Administered 2023-09-28: 0.5 ug/kg/h via INTRAVENOUS
  Administered 2023-09-28: 0.4 ug/kg/h via INTRAVENOUS
  Administered 2023-09-29: 0.5 ug/kg/h via INTRAVENOUS
  Filled 2023-09-28 (×3): qty 100

## 2023-09-28 MED ORDER — BUPIVACAINE-EPINEPHRINE 0.25% -1:200000 IJ SOLN
INTRAMUSCULAR | Status: AC
  Filled 2023-09-28: qty 1

## 2023-09-28 MED ORDER — ROCURONIUM BROMIDE 100 MG/10ML IV SOLN
INTRAVENOUS | Status: DC | PRN
  Administered 2023-09-28: 50 mg via INTRAVENOUS
  Administered 2023-09-28: 70 mg via INTRAVENOUS

## 2023-09-28 MED ORDER — AMIODARONE HCL IN DEXTROSE 360-4.14 MG/200ML-% IV SOLN
60.0000 mg/h | INTRAVENOUS | Status: DC
  Administered 2023-09-28: 60 mg/h via INTRAVENOUS
  Filled 2023-09-28: qty 200

## 2023-09-28 MED ORDER — VASOPRESSIN 20 UNIT/ML IV SOLN
INTRAVENOUS | Status: AC
  Filled 2023-09-28: qty 1

## 2023-09-28 MED ORDER — SODIUM CHLORIDE 0.9 % IV SOLN: INTRAVENOUS | Status: DC | PRN

## 2023-09-28 MED ORDER — ALBUMIN HUMAN 5 % IV SOLN
INTRAVENOUS | Status: DC | PRN
Start: 2023-09-28 — End: 2023-09-28

## 2023-09-28 MED ORDER — ESMOLOL HCL 100 MG/10ML IV SOLN
INTRAVENOUS | Status: DC | PRN
  Administered 2023-09-28: 10 mg via INTRAVENOUS
  Administered 2023-09-28: 30 mg via INTRAVENOUS

## 2023-09-28 MED ORDER — LACTATED RINGERS IV SOLN: INTRAVENOUS | Status: DC | PRN

## 2023-09-28 MED ORDER — 0.9 % SODIUM CHLORIDE (POUR BTL) OPTIME
TOPICAL | Status: DC | PRN
  Administered 2023-09-28 (×2): 1000 mL

## 2023-09-28 MED ORDER — ORAL CARE MOUTH RINSE
15.0000 mL | OROMUCOSAL | Status: DC
  Administered 2023-09-28 (×2): 15 mL via OROMUCOSAL

## 2023-09-28 MED ORDER — VASOPRESSIN 20 UNIT/ML IV SOLN
INTRAVENOUS | Status: DC | PRN
  Administered 2023-09-28 (×3): 1 [IU] via INTRAVENOUS

## 2023-09-28 MED ORDER — PHENYLEPHRINE HCL (PRESSORS) 10 MG/ML IV SOLN
INTRAVENOUS | Status: DC | PRN
  Administered 2023-09-28: 80 ug via INTRAVENOUS
  Administered 2023-09-28: 120 ug via INTRAVENOUS
  Administered 2023-09-28 (×2): 160 ug via INTRAVENOUS
  Administered 2023-09-28: 120 ug via INTRAVENOUS
  Administered 2023-09-28: 16 ug via INTRAVENOUS

## 2023-09-28 MED ORDER — MIDAZOLAM HCL 2 MG/2ML IJ SOLN
INTRAMUSCULAR | Status: AC
  Filled 2023-09-28: qty 2

## 2023-09-28 MED ORDER — SODIUM CHLORIDE 0.9% IV SOLUTION: Freq: Once | INTRAVENOUS | Status: DC

## 2023-09-28 MED ORDER — SODIUM CHLORIDE 0.9% IV SOLUTION: Freq: Once | INTRAVENOUS | Status: AC

## 2023-09-28 MED ORDER — ALBUMIN HUMAN 5 % IV SOLN
INTRAVENOUS | Status: AC
  Filled 2023-09-28: qty 500

## 2023-09-28 MED ORDER — CEFAZOLIN SODIUM-DEXTROSE 2-3 GM-%(50ML) IV SOLR: INTRAVENOUS | Status: DC | PRN

## 2023-09-28 MED ORDER — PHENYLEPHRINE 80 MCG/ML (10ML) SYRINGE FOR IV PUSH (FOR BLOOD PRESSURE SUPPORT)
PREFILLED_SYRINGE | INTRAVENOUS | Status: AC
  Filled 2023-09-28: qty 10

## 2023-09-28 MED ORDER — AMIODARONE HCL IN DEXTROSE 360-4.14 MG/200ML-% IV SOLN
30.0000 mg/h | INTRAVENOUS | Status: DC
  Administered 2023-09-28: 33.3 mg/h via INTRAVENOUS
  Filled 2023-09-28 (×3): qty 200

## 2023-09-28 SURGICAL SUPPLY — 46 items
APPLIER CLIP 5 13 M/L LIGAMAX5 (MISCELLANEOUS) IMPLANT
BAG COUNTER SPONGE SURGICOUNT (BAG) IMPLANT
CABLE HIGH FREQUENCY MONO STRZ (ELECTRODE) ×1 IMPLANT
CATH ROBINSON RED A/P 20FR (CATHETERS) IMPLANT
CELLS DAT CNTRL 66122 CELL SVR (MISCELLANEOUS) IMPLANT
CLIP APPLIE 5 13 M/L LIGAMAX5 (MISCELLANEOUS) IMPLANT
COVER MAYO STAND STRL (DRAPES) ×1 IMPLANT
DERMABOND ADVANCED .7 DNX12 (GAUZE/BANDAGES/DRESSINGS) ×1 IMPLANT
DRAIN CHANNEL 19F RND (DRAIN) IMPLANT
DRSG TEGADERM 4X4.75 (GAUZE/BANDAGES/DRESSINGS) IMPLANT
ELECT REM PT RETURN 15FT ADLT (MISCELLANEOUS) ×1 IMPLANT
EVACUATOR SILICONE 100CC (DRAIN) IMPLANT
GAUZE 4X4 16PLY ~~LOC~~+RFID DBL (SPONGE) IMPLANT
GAUZE SPONGE 4X4 12PLY STRL (GAUZE/BANDAGES/DRESSINGS) IMPLANT
GLOVE BIO SURGEON STRL SZ 6.5 (GLOVE) ×2 IMPLANT
GLOVE INDICATOR 6.5 STRL GRN (GLOVE) ×2 IMPLANT
IRRIG SUCT STRYKERFLOW 2 WTIP (MISCELLANEOUS) ×1 IMPLANT
IRRIGATION SUCT STRKRFLW 2 WTP (MISCELLANEOUS) ×1 IMPLANT
KIT TURNOVER KIT A (KITS) IMPLANT
LIGASURE BLUNT TIP 5 LONG 44CM (ELECTROSURGICAL) IMPLANT
PACK COLON (CUSTOM PROCEDURE TRAY) ×1 IMPLANT
RETRACTOR WND ALEXIS 18 MED (MISCELLANEOUS) IMPLANT
RTRCTR WOUND ALEXIS 18CM MED (MISCELLANEOUS) IMPLANT
SCISSORS LAP 5X35 DISP (ENDOMECHANICALS) ×1 IMPLANT
SEALER TISSUE G2 STRG ARTC 35C (ENDOMECHANICALS) IMPLANT
SET TUBE SMOKE EVAC HIGH FLOW (TUBING) ×1 IMPLANT
SLEEVE Z-THREAD 5X100MM (TROCAR) ×1 IMPLANT
SPIKE FLUID TRANSFER (MISCELLANEOUS) ×1 IMPLANT
STAPLER SKIN PROX WIDE 3.9 (STAPLE) IMPLANT
SUT ETHILON 2 0 PS N (SUTURE) IMPLANT
SUT NOVA NAB DX-16 0-1 5-0 T12 (SUTURE) ×1 IMPLANT
SUT PDS AB 1 TP1 96 (SUTURE) IMPLANT
SUT PROLENE 2 0 KS (SUTURE) IMPLANT
SUT SILK 2 0 SH CR/8 (SUTURE) IMPLANT
SUT SILK 2-0 18XBRD TIE 12 (SUTURE) ×1 IMPLANT
SUT SILK 3 0 SH CR/8 (SUTURE) ×1 IMPLANT
SUT SILK 3-0 18XBRD TIE 12 (SUTURE) IMPLANT
SUT VIC AB 2-0 SH 18 (SUTURE) ×1 IMPLANT
SYS LAPSCP GELPORT 120MM (MISCELLANEOUS) IMPLANT
SYS WOUND ALEXIS 18CM MED (MISCELLANEOUS) IMPLANT
SYSTEM LAPSCP GELPORT 120MM (MISCELLANEOUS) IMPLANT
SYSTEM WOUND ALEXIS 18CM MED (MISCELLANEOUS) IMPLANT
TOWEL OR 17X26 10 PK STRL BLUE (TOWEL DISPOSABLE) ×2 IMPLANT
TRAY FOLEY MTR SLVR 16FR STAT (SET/KITS/TRAYS/PACK) IMPLANT
TROCAR BALLN 12MMX100 BLUNT (TROCAR) IMPLANT
TROCAR Z-THREAD OPTICAL 5X100M (TROCAR) ×1 IMPLANT

## 2023-09-28 NOTE — Anesthesia Procedure Notes (Signed)
Procedure Name: MAC Date/Time: 09/28/2023 11:52 AM  Performed by: Floydene Flock, CRNAPre-anesthesia Checklist: Patient identified, Emergency Drugs available, Suction available and Patient being monitored Patient Re-evaluated:Patient Re-evaluated prior to induction Oxygen Delivery Method: Circle system utilized Preoxygenation: Pre-oxygenation with 100% oxygen Induction Type: IV induction Ventilation: Mask ventilation without difficulty Laryngoscope Size: Mac and 4 Grade View: Grade I Tube type: Oral Tube size: 7.5 mm Number of attempts: 1 Airway Equipment and Method: Stylet Placement Confirmation: ETT inserted through vocal cords under direct vision, positive ETCO2 and breath sounds checked- equal and bilateral Tube secured with: Tape Dental Injury: Teeth and Oropharynx as per pre-operative assessment

## 2023-09-28 NOTE — Anesthesia Procedure Notes (Signed)
Central Venous Catheter Insertion Performed by: Achille Rich, MD, anesthesiologist Start/End2/07/2024 12:22 PM, 09/28/2023 12:32 PM Patient location: Pre-op. Preanesthetic checklist: patient identified, IV checked, site marked, risks and benefits discussed, surgical consent, monitors and equipment checked, pre-op evaluation, timeout performed and anesthesia consent Position: Trendelenburg Lidocaine 1% used for infiltration and patient sedated Hand hygiene performed , maximum sterile barriers used  and Seldinger technique used Catheter size: 7 Fr Central line was placed.Double lumen Procedure performed using ultrasound guided technique. Ultrasound Notes:anatomy identified, needle tip was noted to be adjacent to the nerve/plexus identified and no ultrasound evidence of intravascular and/or intraneural injection Attempts: 1 Following insertion, line sutured, dressing applied and Biopatch. Post procedure assessment: blood return through all ports, free fluid flow and no air  Patient tolerated the procedure well with no immediate complications.

## 2023-09-28 NOTE — Anesthesia Preprocedure Evaluation (Addendum)
Anesthesia Evaluation  Patient identified by MRN, date of birth, ID band Patient awake    Reviewed: Allergy & Precautions, H&P , NPO status , Patient's Chart, lab work & pertinent test results  Airway Mallampati: II   Neck ROM: full    Dental   Pulmonary neg pulmonary ROS   breath sounds clear to auscultation       Cardiovascular negative cardio ROS  Rhythm:regular Rate:Normal     Neuro/Psych    GI/Hepatic ,GERD  ,,  Endo/Other    Class 3 obesity  Renal/GU Renal InsufficiencyRenal disease     Musculoskeletal  (+) Arthritis ,    Abdominal   Peds  Hematology  (+) Blood dyscrasia, anemia Hemoglobin 8.4  Multiple myeloma on chemotherapy.   Anesthesia Other Findings   Reproductive/Obstetrics                             Anesthesia Physical Anesthesia Plan  ASA: 3  Anesthesia Plan: General   Post-op Pain Management:    Induction: Intravenous  PONV Risk Score and Plan: 2 and Ondansetron, Dexamethasone and Treatment may vary due to age or medical condition  Airway Management Planned: Oral ETT  Additional Equipment:   Intra-op Plan:   Post-operative Plan: Extubation in OR  Informed Consent: I have reviewed the patients History and Physical, chart, labs and discussed the procedure including the risks, benefits and alternatives for the proposed anesthesia with the patient or authorized representative who has indicated his/her understanding and acceptance.     Dental advisory given  Plan Discussed with: CRNA, Anesthesiologist and Surgeon  Anesthesia Plan Comments:        Anesthesia Quick Evaluation

## 2023-09-28 NOTE — Progress Notes (Signed)
Progress Note: General Surgery Service   Chief Complaint/Subjective: No complaints this morning.  Plans for surgery understood  Objective: Vital signs in last 24 hours: Temp:  [97.3 F (36.3 C)-98.7 F (37.1 C)] 97.5 F (36.4 C) (02/12 0400) Pulse Rate:  [53-97] 93 (02/12 0800) Resp:  [11-31] 17 (02/12 0800) BP: (91-165)/(38-72) 165/47 (02/12 0800) SpO2:  [91 %-97 %] 96 % (02/12 0800) Weight:  [108.8 kg] 108.8 kg (02/12 0500) Last BM Date : 09/27/23  Intake/Output from previous day: 02/11 0701 - 02/12 0700 In: 1192.6 [I.V.:581.6; Blood:386; IV Piggyback:225] Out: 1000 [Urine:1000] Intake/Output this shift: Total I/O In: 10 [I.V.:8.1; IV Piggyback:1.9] Out: 300 [Urine:300]  Constitutional: NAD; conversant; no deformities Eyes: Moist conjunctiva; no lid lag; anicteric; PERRL Neck: Trachea midline; no thyromegaly Lungs: Normal respiratory effort; no tactile fremitus CV: RRR; no palpable thrills; no pitting edema GI: Abd soft, nontender; no palpable hepatosplenomegaly Buttock: large sacral wound MSK: Normal range of motion of extremities; no clubbing/cyanosis Psychiatric: Appropriate affect; alert and oriented x3 Lymphatic: No palpable cervical or axillary lymphadenopathy  Lab Results: CBC  Recent Labs    09/27/23 0515 09/27/23 1351 09/28/23 0752  WBC 6.4 4.2  --   HGB 7.0* 7.3* 8.5*  HCT 21.2* 22.4* 26.2*  PLT 208 217  --    BMET Recent Labs    09/27/23 0314 09/27/23 0515 09/27/23 0828 09/28/23 0013 09/28/23 0752  NA 122* 115*  114*   < > 130* 133*  K 5.4* 4.8  --   --   --   CL 89* 85*  --   --   --   CO2 19* 19*  --   --   --   GLUCOSE 97 417*  --   --   --   BUN 87* 75*  --   --   --   CREATININE 3.56* 3.22*  --   --   --   CALCIUM 8.5* 7.7*  --   --   --    < > = values in this interval not displayed.   PT/INR No results for input(s): "LABPROT", "INR" in the last 72 hours. ABG Recent Labs    09/27/23 0011 09/27/23 0108  PHART  --  7.41   HCO3 21.9 21.1    Anti-infectives: Anti-infectives (From admission, onward)    Start     Dose/Rate Route Frequency Ordered Stop   09/27/23 1100  piperacillin-tazobactam (ZOSYN) IVPB 3.375 g        3.375 g 12.5 mL/hr over 240 Minutes Intravenous Every 8 hours 09/27/23 1052     09/27/23 0600  ceFEPIme (MAXIPIME) 2 g in sodium chloride 0.9 % 100 mL IVPB  Status:  Discontinued        2 g 200 mL/hr over 30 Minutes Intravenous Every 24 hours 09/27/23 0021 09/27/23 1025   09/27/23 0030  vancomycin (VANCOREADY) IVPB 2000 mg/400 mL        2,000 mg 200 mL/hr over 120 Minutes Intravenous  Once 09/27/23 0021 09/27/23 0333   09/27/23 0021  vancomycin variable dose per unstable renal function (pharmacist dosing)  Status:  Discontinued         Does not apply See admin instructions 09/27/23 0021 09/27/23 0416   09/26/23 2200  cefTRIAXone (ROCEPHIN) 2 g in sodium chloride 0.9 % 100 mL IVPB        2 g 200 mL/hr over 30 Minutes Intravenous  Once 09/26/23 2148 09/26/23 2323       Medications: Scheduled Meds:  sodium chloride   Intravenous Once   Chlorhexidine Gluconate Cloth  6 each Topical Daily   docusate sodium  100 mg Oral BID   famotidine  10 mg Oral QHS   insulin aspart  1-3 Units Subcutaneous Q4H   sodium hypochlorite   Irrigation BID   Continuous Infusions:  iron sucrose Stopped (09/27/23 2126)   norepinephrine (LEVOPHED) Adult infusion Stopped (09/28/23 0805)   piperacillin-tazobactam (ZOSYN)  IV Stopped (09/28/23 0700)   PRN Meds:.acetaminophen **OR** acetaminophen, menthol-cetylpyridinium, ondansetron **OR** ondansetron (ZOFRAN) IV, mouth rinse, polyethylene glycol, senna-docusate  Assessment/Plan: Derek [name] is immobile and has a large sacral wound with necrotic tissue which is difficult to manage.  I recommend debridement of sacral pressure wound with laparoscopic loop colostomy formation in the OR today to treat the wound and divert the flow of stool from the anus.  We  discussed the procedure, its risks, benefits and alternatives.  After a full discussion and all questions answered the patient and his family granted consent to proceed.     LOS: 2 days    Derek Ore, MD  Surgery [facility] Use [email or web address] to contact on call provider  Daily Billing: [phone] - High MDM

## 2023-09-28 NOTE — Addendum Note (Signed)
Addendum  created 09/28/23 1736 by Collene Schlichter, MD   Attestation recorded in Intraprocedure, Intraprocedure Attestations filed

## 2023-09-28 NOTE — Progress Notes (Signed)
Initial Nutrition Assessment  DOCUMENTATION CODES:   Obesity unspecified  INTERVENTION:  - Diet advancement per MD.  - Recommend Ensure Plus High Protein po TID once diet advanced. Each supplement provides 350 kcal and 20 grams of protein. - Recommend daily Multivitamin with minerals daily.   NUTRITION DIAGNOSIS:   Increased nutrient needs related to cancer and cancer related treatments, wound healing as evidenced by estimated needs.  GOAL:   Patient will meet greater than or equal to 90% of their needs  MONITOR:   Diet advancement, Weight trends  REASON FOR ASSESSMENT:   Consult Assessment of nutrition requirement/status  ASSESSMENT:   78 y.o. male with PMH multiple myeloma on chemotherapy, dyslipidemia, and GERD who presented for increasing lower extremity edema, perineal weeping sores, and worsening generalized weakness for 1-2 weeks.  2/11 Admit 2/12 s/p debridement of sacral wound and colostomy   Patient out of room in OR at time of visit. No family at bedside.   Per EMR, weight appears stable the past year. There is a higher weight in December however this is an outlier from the rest of weights this year. However, current weight may be elevated as patient noted to have weeping wounds on legs and be third spacing. Will monitor weight trends.  Patient is noted to be wheelchair bound at baseline.   Patient on a heart diet 2/11 but no meal intakes documented. He is now NPO for surgery. Will monitor for diet advancement to order nutrition supplements.    Of note, palliative has been consulted and is following patient for ongoing GOC discussions.    Medications reviewed and include: Colace  Labs reviewed:  Na 132 Creatinine 1.81   NUTRITION - FOCUSED PHYSICAL EXAM:  Patient out of room at time of visit  Diet Order:   Diet Order             Diet NPO time specified  Diet effective now                   EDUCATION NEEDS:  Not appropriate for  education at this time  Skin:  Skin Assessment: Skin Integrity Issues: Skin Integrity Issues:: Other (Comment), Unstageable, DTI, Incisions DTI: Left Heel Unstageable: Sacrum Incisions: Abdomen Other: Venous stasis ulcer on L and R tibial  Last BM:  2/11  Height:  Ht Readings from Last 1 Encounters:  09/26/23 5\' 6"  (1.676 m)   Weight:  Wt Readings from Last 1 Encounters:  09/28/23 108.8 kg   Ideal Body Weight:  64.55 kg  BMI:  Body mass index is 38.71 kg/m.  Estimated Nutritional Needs:  Kcal:  2100-2300 kcals Protein:  110-130 grams Fluid:  >/= 2.1L    Shelle Iron RD, LDN Contact via Secure Chat.

## 2023-09-28 NOTE — TOC Initial Note (Signed)
Transition of Care [facility]) - Initial/Assessment Note    Patient Details  Name: Derek [name] MRN: [id number] Date of Birth: 12-08-1945  Transition of Care [facility]) CM/SW Contact:    Otelia Santee, LCSW Phone Number: 09/28/2023, 8:52 AM  Clinical Narrative:                 Pt from home w/ spouse. Pt bed/wheelchair bound at baseline. Pt receives aide services 3x/wk through the [place]. TOC will follow for discharge planning.   Expected Discharge Plan:  (TBD) Barriers to Discharge: No Barriers Identified   Patient Goals and CMS Choice Patient states their goals for this hospitalization and ongoing recovery are:: For this pt to get medically better          Expected Discharge Plan and Services In-house Referral: Clinical Social Work Discharge Planning Services: NA Post Acute Care Choice:  (Unsure at this time) Living arrangements for the past 2 months: Single Family Home                                      Prior Living Arrangements/Services Living arrangements for the past 2 months: Single Family Home Lives with:: Spouse Patient language and need for interpreter reviewed:: Yes Do you feel safe going back to the place where you live?: Yes      Need for Family Participation in Patient Care: Yes (Comment) Care giver support system in place?: Yes (comment) Current home services: DME, Homehealth aide Furniture conservator/restorer; Aide through [place] 3x/wk) Criminal Activity/Legal Involvement Pertinent to Current Situation/Hospitalization: No - Comment as needed  Activities of Daily Living      Permission Sought/Granted Permission sought to share information with : Family Supports    Share Information with NAME: Derek [name]     Permission granted to share info w Relationship: Spouse     Emotional Assessment Appearance:: Appears stated age Attitude/Demeanor/Rapport: Unable to Assess Affect (typically observed): Unable to Assess Orientation: : Oriented to Self, Oriented to Place,  Oriented to  Time, Oriented to Situation Alcohol / Substance Use: Not Applicable Psych Involvement: No (comment)  Admission diagnosis:  Hyperkalemia [E87.5] Hyponatremia [E87.1] Peripheral edema [R60.0] Acute cystitis with hematuria [N30.01] AKI (acute kidney injury) (HCC) [N17.9] Sepsis (HCC) [A41.9] Pressure injury of left thigh, stage 2 (HCC) [L89.222] Pressure injury of contiguous region involving back, buttock, and hip, stage 2, unspecified laterality (HCC) [L89.42] Patient Active Problem List   Diagnosis Date Noted   Sepsis (HCC) 09/27/2023   Palliative care encounter 09/27/2023   Peripheral edema 09/27/2023   Pressure injury of contiguous region involving back, buttock, and hip, stage 2 (HCC) 09/27/2023   Pressure injury of left thigh, stage 2 (HCC) 09/27/2023   Counseling and coordination of care 09/27/2023   Need for emotional support 09/27/2023   Hyponatremia 09/26/2023   Decubitus ulcer of sacral region 09/26/2023   Hyperkalemia 09/26/2023   AKI (acute kidney injury) (HCC) 09/26/2023   Metabolic acidosis 09/26/2023   Anorexia 09/26/2023   TIA (transient ischemic attack) 07/29/2023   Closed fracture of left distal femur (HCC) 12/04/2022   Spondylolisthesis, lumbar region 12/22/2021   Lumbar stenosis 10/20/2021   Stenosis of cervical spine with myelopathy (HCC) 05/06/2021   Status post total left knee replacement 07/22/2020   Unilateral primary osteoarthritis, left knee 03/24/2020   Knee pain, chronic 06/15/2016   GERD (gastroesophageal reflux disease)    Morbid obesity (HCC)    Urethritis 03/20/2013  PCP:  [facility] Pharmacy:   [facility] [address], [place] [address] [place] [phone] [place] [address] Phone: [phone] Fax: [phone]  [facility] [address] [place] [address] Phone: [phone] Fax: [phone]  [facility] [address] ([place]), [place] [address] [place] ([place]) [place] [address] Phone: [phone] Fax: [phone]     Social Drivers of Health (SDOH) Social History: SDOH Screenings   Food Insecurity: No Food Insecurity (09/27/2023)  Housing: Low Risk  (09/27/2023)  Transportation Needs: No Transportation Needs (09/27/2023)  Utilities: Not At Risk (09/27/2023)  Alcohol Screen: Low Risk  (12/24/2022)  Depression (PHQ2-9): Medium Risk (04/05/2023)  Financial Resource Strain: Low Risk  (12/24/2022)  Physical Activity: Inactive (04/05/2023)  Social Connections: Socially Isolated (09/27/2023)  Stress: Stress Concern Present (04/05/2023)  Tobacco Use: Low Risk  (07/29/2023)   SDOH Interventions:     Readmission Risk Interventions     No data to display

## 2023-09-28 NOTE — Progress Notes (Signed)
09/28/2023 Seen postop Vent adjusted Active rewarming Give additional volume for vasoplegic state post debridement 1700 lab check Wife updated at bedside Let rest on vent tonight.    Myrla Halsted MD PCCM

## 2023-09-28 NOTE — Anesthesia Procedure Notes (Signed)
Arterial Line Insertion Start/End2/07/2024 12:18 PM, 09/28/2023 12:22 PM Performed by: Achille Rich, MD  Patient location: Pre-op. Preanesthetic checklist: patient identified, IV checked, site marked, risks and benefits discussed, surgical consent, monitors and equipment checked, pre-op evaluation, timeout performed and anesthesia consent Lidocaine 1% used for infiltration Left, radial was placed Catheter size: 20 G Hand hygiene performed  and maximum sterile barriers used   Attempts: 1 Procedure performed without using ultrasound guided technique. Following insertion, dressing applied. Post procedure assessment: normal and unchanged

## 2023-09-28 NOTE — Progress Notes (Signed)
   NAME:  Derek [name], MRN:  [id number], DOB:  03/27/1946, LOS: 2 ADMISSION DATE:  09/26/2023, CONSULTATION DATE: 09/27/2023 REFERRING MD: Loetta Rough, MD, CHIEF COMPLAINT: hypotension   History of Present Illness:  A 78 y.o. male with multiple myeloma on chemotherapy, dyslipidemia, and GERD.  Brought in by family for increasing lower extremity edema, perineal weeping sores, and worsening generalized weakness for 1-2 weeks.  At baseline patient relatively independent.  He is experience rapid decline in function recently.  Per patient report because he is difficult to move he has been left in his urine and feces.  Per wife the patient experiences pain and does not want to be moved. Smoker. In ED, he was found to be hypotensive and given 2.5 L LR, Vanco, Rocephin, Cefepime, Levophed @ 7 mcg/min now, Rx for hyperK, and HTS for hypoNa.    Pertinent  Medical History  Multiple myeloma on chemotherapy, dyslipidemia, GERD  Significant Hospital Events: Including procedures, antibiotic start and stop dates in addition to other pertinent events   2/11 admit, CCS consult 2/12 for OR  Interim History / Subjective:  Off pressors, feels better, npo for OR.  Objective   Blood pressure (!) 145/51, pulse 93, temperature (!) 97.5 F (36.4 C), temperature source Oral, resp. rate (!) 21, height 5\' 6"  (1.676 m), weight 108.8 kg, SpO2 96%.        Intake/Output Summary (Last 24 hours) at 09/28/2023 7829 Last data filed at 09/28/2023 5621 Gross per 24 hour  Intake 1200.39 ml  Output 1000 ml  Net 200.39 ml   Filed Weights   09/26/23 1423 09/28/23 0500  Weight: 108.9 kg 108.8 kg    Examination: No distress MM less dry Globally weak esp lowers See media for pressure ulcers Aox3 Ext warm Heart sounds regular Lungs clear  I believe the sodium of 114/115 at 5am yesterday was erroneous looking at trend, he is actually 120 on admit to 133 today  CBC and BMP pending  Resolved Hospital Problem list      Assessment & Plan:  Septic shock 2/2 large necrotic sacral ulcer- shock state resolved Wheelchair bound, FTT Hypovolemic hyponatremia improved Mixed prerenal and septic ATN AKI Multiple Myeloma on chemo Anemia- given blood 2/11, recheck pending L femoral vein DVT- AC held given anemia GERD Class 3 obesity  For OR today for debridement and potential diverting ostomy Heparin gtt once H/H stable postop Appreciate PMT support for family as prognosis is fairly grim Vanc zosyn for now, f/u admit and intra-op cultures F/u AM BMP, may need additional IVF  Best Practice (right click and "Reselect all SmartList Selections" daily)   Diet/type: NPO for OR DVT prophylaxis: off Pressure ulcer(s): present on admission  GI prophylaxis: H2B Lines: N/A Foley:  Yes, and it is still needed Code Status:  full code Last date of multidisciplinary goals of care discussion [PMT 09/27/23]  Myrla Halsted MD PCCM

## 2023-09-28 NOTE — Progress Notes (Signed)
Daily Progress Note   Patient Name: Derek [name]       Date: 09/28/2023 DOB: [birth year]  Age: 78 y.o. MRN#: [id number] Attending Physician: Lorin Glass, MD Primary Care Physician: [facility] Admit Date: 09/26/2023 Length of Stay: 2 days  Reason for Consultation/Follow-up: Establishing goals of care  Subjective:   CC: Patient confused laying in bed. Wife and daughter present at bedside. Following up regarding complex medical decision making.   Subjective:   Reviewed EMR for updates.  Surgical team has met with patient to discuss care.  Plan is for laparoscopic loop colostomy formation in the OR with debridement of sacral pressure wound as well.  Patient and family agreeing to proceed with surgical intervention at this time.  Reviewed ACS Surgical Risk calculator for laparoscopic colostomy in this patient. Could not incorporate risks in calculator specifically to multiple wounds including unstageable pressure injury to sacral area though believe though would elevated patient's risk further than stated in these percentages. Calculator showed risks based on input to be: Serious complication: above average at 35.2% (vs 13%) Any complication: above average at 48.3% (vs 13%) Pneumonia: above average at 7.9% (vs 13%) Venous thromboembolism: above average at 8.4% (vs 13%) Readmission: above average at 17.9% (vs 13%) Return to OR: above average at 8.8% (vs 13%) Death: above average at 41.5% (vs 13%) Discharge to nursing or Rehab facility: above average at 80.5% (vs 13%) Post Operative Delirium: above average at 16.7% (vs 11.9%)  Presented to bedside to see patient.  Patient laying in bed mumbling at times.  Appears overall confused.  Patient's wife and daughter present at bedside.  Again introduced myself as a member of the palliative medicine team.  Wife notes that patient has been confused though she was able to meet with surgeon today and agrees with proceeding with surgical  intervention for wound management.  I again mentioned concerns regarding nutrition and patient's poor functional status prior to admission.  Family planning to proceed with surgery at this time.  Noted palliative medicine team will continue to follow along with patient's medical journey.  Objective:   Vital Signs:  BP (!) 165/47   Pulse 93   Temp (!) 97.5 F (36.4 C) (Oral)   Resp 17   Ht 5\' 6"  (1.676 m)   Wt 108.8 kg   SpO2 96%   BMI 38.71 kg/m   Physical Exam: General: NAD, lethargic, mumbling and confused at times, chronically ill-appearing Cardiovascular: RRR Respiratory: no increased work of breathing noted, not in respiratory distress Skin: Multiple wounds noted in EMR review  Imaging: I personally reviewed recent imaging.   Assessment & Plan:   Assessment: Patient is a 78 year old male with a past medical history of multiple myeloma on chemotherapy, dyslipidemia, and GERD who was admitted on 09/27/2023 for management of increasing lower extremity edema, peritoneal weeping sores, and worsening generalized weakness for the past 1 to 2 weeks. Upon admission patient found to be in septic shock due to severe dehydration and possible sepsis, AKI, lactic acidosis, and to have multiple electrolyte abnormalities. Palliative medicine team consulted to assist with complex medical decision making.   Recommendations/Plan: # Complex medical decision making/goals of care:  - Patient confused today when seen.  Patient's wife and daughter at bedside.  Patient's wife and daughter noted patient has elected to proceed with surgery.  Have expressed concern about patient's poor functional status and lack of nutrition prior to admission and over the past year which could complicate patient's ability to overcome  this medical illness with multiple comorbidities.  Detailed ACS risk calculator noted in HPI.  Offered support.  Noted palliative medicine team will continue to engage in conversations as  appropriate.  -  Code Status: Full Code  # Psychosocial Support:  - wife, daughter   # Discharge Planning: To Be Determined  Discussed with: patient, patient's wife and daughter at bedside, RN  Thank you for allowing the palliative care team to participate in the care [facility].  Alvester Morin, DO Palliative Care Provider PMT # [phone]  If patient remains symptomatic despite maximum doses, please call PMT at [phone] between 0700 and 1900. Outside of these hours, please call attending, as PMT does not have night coverage.  Personally spent 37 minutes in patient care including extensive chart review (labs, imaging, progress/consult notes, vital signs), medically appropraite exam, discussed with treatment team, education to patient, family, and staff, documenting clinical information, medication review and management, coordination of care, and available advanced directive documents.

## 2023-09-28 NOTE — Op Note (Signed)
Patient: Derek [name] ([birth year], [id number])  Date of Surgery: 09/28/2023  Preoperative Diagnosis: SACRAL DECUBITUS ULCER   Postoperative Diagnosis: SACRAL DECUBITUS ULCER   Surgical Procedure:  LAPAROSCOPIC LOOP COLOSTOMY  EXCISIONAL SHARP DEBRIDEMENT OF 24 CM TALL X 16 CM WIDE X 4 CM DEEP SACRAL WOUND USING A SCALPEL AND ELECTROCAUTERY   Operative Team Members:  Surgeons and Role:    * Markavious Micco, Hyman Hopes, MD - Primary   Anesthesiologist: Achille Rich, MD; Collene Schlichter, MD CRNA: Floydene Flock, CRNA   Anesthesia: General   Fluids:  Total I/O In: 1576.2 [I.V.:9.4; Blood:315; IV Piggyback:1251.9] Out: 650 [Urine:650]  Complications: None  Drains:  none   Specimen:  ID Type Source Tests Collected by Time Destination  A : Sacral Wound Tissue PATH Soft tissue AEROBIC/ANAEROBIC CULTURE W GRAM STAIN (SURGICAL/DEEP WOUND) Derek [name], Hyman Hopes, MD 09/28/2023 1137      Disposition:  ICU - intubated and critically ill.  Plan of Care:  Continue ICU care    Indications for Procedure: Derek [name] is a 78 y.o. male who presented with a large sacral pressure wound.  He has a mobility issues.  I recommended debridement of the sacral wound with laparoscopic loop transverse colostomy placement.  The patient was resuscitated the ICU and prepared for surgery today.  The plan was discussed with the family.  The procedure itself as well as its risks, benefits and alternatives were discussed.  The risks discussed included but were not limited to the risk of infection, bleeding, damage to nearby structures, and bowel injury, need for additional debridement.  After a full discussion and all questions answered the patient granted consent to proceed.  Findings: 24 cm tall by 16 cm wide by 4 cm deep large sacral wound with necrotic tissue   Description of Procedure:   On the day stated above the patient taken the operating suite.  General endotracheal anesthesia was induced.   First he was flipped into the prone position to perform the sacral wound debridement.  A timeout was completed verifying the correct patient, procedure, position, and equipment needed for the case.  The wound was debrided sharply with electrocautery.  I did not encounter any dishwater fluid or signs of a necrotizing soft tissue infection.  There was a significant amount of necrotic tissue which was sharply debrided and excised from the wound using electrocautery and scalpel.  It appeared this necrotic tissue was limited to the area of pressure necrosis and not related to a necrotizing soft tissue infection.  There were also ischial pressure wounds with skin breakdown.  I was concerned about the right sided wound so I did create a circular incision in the center of the skin breakdown and there did not appear to be necrotic tissue underlying it.  I wrote left the remainder of the skin in this area.  On the left side I extended the sacral wound debridement down to the ischial pressure area and remove some additional necrotic tissue.  With hemostasis obtained with electrocautery and then measured the wound and placed wet to dry dressings.  The patient was then flipped to prone positioning for the laparoscopic portion of the procedure.  The patient's abdomen is prepped draped in usual sterile fashion.  A timeout was completed verifying the correct patient, procedure, position, and equipment needed for the case.  The patient's abdomen was entered in the right upper quadrant using 5 mm trocar and inflated to 15 mmHg.  2 additional 5 mm trocars were placed 1  in the right lower quadrant 1 in the midline just below the umbilicus.  The transverse colon was grasped and elevated.  Adhesions anterior abdominal wall were divided using electrocautery.  The omentum was dissected off the mid to distal transverse colon and it was mobilized to reach the abdominal wall.  A circular incision was then made in the skin and continued down  through the fascia of the rectus muscle splitting the rectus muscle along its fibers and a Tanja Port was used to grasp the transverse colon which was delivered through this defect.  The abdomen was then deflated and the skin of the port sites was closed using 4-0 Monocryl and Dermabond.  The ostomy was then matured.  A red rubber catheter was placed through the mesocolon along the wall of the colon to act as a support while the ostomy heals.  Multiple Brooke style sutures were placed in each quadrant and on each side of the red rubber catheter to mature the ostomy.  Multiple additional 2-0 Vicryl sutures were then placed to fix the mucosa of the colon to the epidermis.  A ostomy appliance was applied.  The patient remained intubated as a entered A-fib with RVR during the case and required amiodarone both bolus and ongoing resuscitation by the anesthesia team.  It was felt best to transfer the patient back to the ICU intubated to continue to stabilize him as he is likely having a SIRS response from the sacral wound debridement.  The patient was transferred back to the ICU.  All sponge needle counts were correct at the end of this case.  At the end of the case we reviewed the infection status of the case. Patient: Derek [name] Emergency General Surgery Service Patient Case: Urgent Infection Present At Time Of Surgery (PATOS):  Necrotic sacral wound.  Creation of ostomy  [name], MD General, Bariatric, & Minimally Invasive Surgery [facility], [place]

## 2023-09-28 NOTE — Transfer of Care (Signed)
Immediate Anesthesia Transfer of Care Note  Patient: Derek [name]  Procedure(s) Performed: LAPAROSCOPIC LOOP COLOSTOMY DEBRIDEMENT OF SACRAL WOUND  Patient Location: PACU and ICU  Anesthesia Type:General  Level of Consciousness: sedated and Patient remains intubated per anesthesia plan  Airway & Oxygen Therapy: Patient remains intubated per anesthesia plan and Patient placed on Ventilator (see vital sign flow sheet for setting)  Post-op Assessment: Report given to RN and Post -op Vital signs reviewed and stable  Post vital signs: Reviewed and stable  Last Vitals:  Vitals Value Taken Time  BP    Temp    Pulse    Resp    SpO2 100 % 09/28/23 1413    Last Pain:  Vitals:   09/28/23 0800  TempSrc: Oral  PainSc:          Complications: No notable events documented.

## 2023-09-28 NOTE — Anesthesia Postprocedure Evaluation (Signed)
Anesthesia Post Note  Patient: Derek [name]  Procedure(s) Performed: LAPAROSCOPIC LOOP COLOSTOMY DEBRIDEMENT OF SACRAL WOUND     Patient location during evaluation: SICU Anesthesia Type: General Level of consciousness: sedated Pain management: pain level controlled Vital Signs Assessment: vitals unstable Respiratory status: patient remains intubated per anesthesia plan Cardiovascular status: stable Postop Assessment: no apparent nausea or vomiting Anesthetic complications: no   No notable events documented.  Last Vitals:  Vitals:   09/28/23 1615 09/28/23 1700  BP:    Pulse: (!) 106 (!) 106  Resp: (!) 28 (!) 28  Temp: (!) 34.4 C (!) 35.1 C  SpO2: 100% 100%    Last Pain:  Vitals:   09/28/23 1615  TempSrc: Esophageal  PainSc:                  Collene Schlichter

## 2023-09-29 ENCOUNTER — Encounter (HOSPITAL_COMMUNITY): Payer: Self-pay | Admitting: Surgery

## 2023-09-29 DIAGNOSIS — A419 Sepsis, unspecified organism: Secondary | ICD-10-CM | POA: Diagnosis not present

## 2023-09-29 DIAGNOSIS — L8942 Pressure ulcer of contiguous site of back, buttock and hip, stage 2: Secondary | ICD-10-CM | POA: Diagnosis not present

## 2023-09-29 DIAGNOSIS — R6521 Severe sepsis with septic shock: Secondary | ICD-10-CM | POA: Diagnosis not present

## 2023-09-29 LAB — BASIC METABOLIC PANEL
Anion gap: 10 (ref 5–15)
BUN: 55 mg/dL — ABNORMAL HIGH (ref 8–23)
CO2: 18 mmol/L — ABNORMAL LOW (ref 22–32)
Calcium: 8.3 mg/dL — ABNORMAL LOW (ref 8.9–10.3)
Chloride: 103 mmol/L (ref 98–111)
Creatinine, Ser: 1 mg/dL (ref 0.61–1.24)
GFR, Estimated: >60 mL/min (ref >60)
Glucose, Bld: 115 mg/dL — ABNORMAL HIGH (ref 70–99)
Potassium: 3.7 mmol/L (ref 3.5–5.1)
Sodium: 131 mmol/L — ABNORMAL LOW (ref 135–145)

## 2023-09-29 LAB — POCT I-STAT 7, (LYTES, BLD GAS, ICA,H+H)
Acid-base deficit: 5 mmol/L — ABNORMAL HIGH (ref 0.0–2.0)
Bicarbonate: 21.3 mmol/L (ref 20.0–28.0)
Calcium, Ion: 1.28 mmol/L (ref 1.15–1.40)
HCT: 23 % — ABNORMAL LOW (ref 39.0–52.0)
Hemoglobin: 7.8 g/dL — ABNORMAL LOW (ref 13.0–17.0)
O2 Saturation: 100 %
Potassium: 4.3 mmol/L (ref 3.5–5.1)
Sodium: 133 mmol/L — ABNORMAL LOW (ref 135–145)
TCO2: 23 mmol/L (ref 22–32)
pCO2 arterial: 47.2 mm[Hg] (ref 32–48)
pH, Arterial: 7.263 — ABNORMAL LOW (ref 7.35–7.45)
pO2, Arterial: 234 mm[Hg] — ABNORMAL HIGH (ref 83–108)

## 2023-09-29 LAB — GLUCOSE, CAPILLARY
Glucose-Capillary: 100 mg/dL — ABNORMAL HIGH (ref 70–99)
Glucose-Capillary: 102 mg/dL — ABNORMAL HIGH (ref 70–99)
Glucose-Capillary: 110 mg/dL — ABNORMAL HIGH (ref 70–99)
Glucose-Capillary: 113 mg/dL — ABNORMAL HIGH (ref 70–99)
Glucose-Capillary: 116 mg/dL — ABNORMAL HIGH (ref 70–99)
Glucose-Capillary: 122 mg/dL — ABNORMAL HIGH (ref 70–99)
Glucose-Capillary: 139 mg/dL — ABNORMAL HIGH (ref 70–99)

## 2023-09-29 LAB — CBC
HCT: 33.3 % — ABNORMAL LOW (ref 39.0–52.0)
Hemoglobin: 11.1 g/dL — ABNORMAL LOW (ref 13.0–17.0)
MCH: 31.2 pg (ref 26.0–34.0)
MCHC: 33.3 g/dL (ref 30.0–36.0)
MCV: 93.5 fL (ref 80.0–100.0)
Platelets: 96 10*3/uL — ABNORMAL LOW (ref 150–400)
RBC: 3.56 MIL/uL — ABNORMAL LOW (ref 4.22–5.81)
RDW: 16.5 % — ABNORMAL HIGH (ref 11.5–15.5)
WBC: 3.7 10*3/uL — ABNORMAL LOW (ref 4.0–10.5)
nRBC: 0 % (ref 0.0–0.2)

## 2023-09-29 LAB — MAGNESIUM: Magnesium: 1.7 mg/dL (ref 1.7–2.4)

## 2023-09-29 LAB — PHOSPHORUS: Phosphorus: 2.6 mg/dL (ref 2.5–4.6)

## 2023-09-29 MED ORDER — MEDIHONEY WOUND/BURN DRESSING EX PSTE
1.0000 | PASTE | Freq: Every day | CUTANEOUS | Status: DC
  Administered 2023-09-29: 1 via TOPICAL
  Filled 2023-09-29: qty 44

## 2023-09-29 MED ORDER — OXYCODONE HCL 5 MG PO TABS
5.0000 mg | ORAL_TABLET | Freq: Once | ORAL | Status: AC
  Administered 2023-09-30: 5 mg via ORAL
  Filled 2023-09-29: qty 1

## 2023-09-29 MED ORDER — MEDIHONEY WOUND/BURN DRESSING EX PSTE
1.0000 | PASTE | Freq: Two times a day (BID) | CUTANEOUS | Status: DC
  Administered 2023-09-29 – 2023-10-17 (×24): 1 via TOPICAL
  Filled 2023-09-29 (×5): qty 44
  Filled 2023-09-29: qty 88

## 2023-09-29 MED ORDER — POTASSIUM CHLORIDE 20 MEQ PO PACK
40.0000 meq | PACK | Freq: Once | ORAL | Status: AC
  Administered 2023-09-29: 40 meq
  Filled 2023-09-29: qty 2

## 2023-09-29 MED ORDER — BOOST / RESOURCE BREEZE PO LIQD CUSTOM
1.0000 | Freq: Three times a day (TID) | ORAL | Status: DC
  Administered 2023-09-30 – 2023-10-02 (×7): 1 via ORAL

## 2023-09-29 MED ORDER — ADULT MULTIVITAMIN W/MINERALS CH
1.0000 | ORAL_TABLET | Freq: Every day | ORAL | Status: DC
  Administered 2023-09-30 – 2023-10-17 (×16): 1 via ORAL
  Filled 2023-09-29 (×18): qty 1

## 2023-09-29 MED ORDER — MAGNESIUM SULFATE 2 GM/50ML IV SOLN
2.0000 g | Freq: Once | INTRAVENOUS | Status: AC
  Administered 2023-09-29: 2 g via INTRAVENOUS
  Filled 2023-09-29: qty 50

## 2023-09-29 MED ORDER — CHLORHEXIDINE GLUCONATE CLOTH 2 % EX PADS
6.0000 | MEDICATED_PAD | Freq: Every day | CUTANEOUS | Status: DC
Start: 2023-09-29 — End: 2023-10-18
  Administered 2023-09-29 – 2023-10-16 (×17): 6 via TOPICAL

## 2023-09-29 MED ORDER — ORAL CARE MOUTH RINSE
15.0000 mL | OROMUCOSAL | Status: DC
  Administered 2023-09-29 – 2023-10-07 (×27): 15 mL via OROMUCOSAL

## 2023-09-29 NOTE — Progress Notes (Addendum)
eLink Physician-Brief Progress Note Patient Name: Derek [name] DOB: 12/16/1945 MRN: [id number]   Date of Service  09/29/2023  HPI/Events of Note  ordered to have wound care 2x a day. The MEDIHONEY is only ordered for daily  eICU Interventions  Adjusted order to twice daily   2218 -add a one-time oxycodone for wound care  Intervention Category Minor Interventions: Routine modifications to care plan (e.g. PRN medications for pain, fever)  Malania Gawthrop 09/29/2023, 8:34 PM

## 2023-09-29 NOTE — Progress Notes (Signed)
[facility] ADULT ICU REPLACEMENT PROTOCOL   The patient does apply for the [facility] Adult ICU Electrolyte Replacment Protocol based on the criteria listed below:   1.Exclusion criteria: TCTS, ECMO, Dialysis, and Myasthenia Gravis patients 2. Is GFR >/= 30 ml/min? Yes.    Patient's GFR today is >60 3. Is SCr </= 2? Yes.   Patient's SCr is 1.00 mg/dL 4. Did SCr increase >/= 0.5 in 24 hours? No. 5.Pt's weight >40kg  Yes.   6. Abnormal electrolyte(s): mag 1.7, potassium 3.7  7. Electrolytes replaced per protocol 8.  Call MD STAT for K+ </= 2.5, Phos </= 1, or Mag </= 1 Physician:  protocol  Melvern Banker 09/29/2023 6:57 AM

## 2023-09-29 NOTE — Procedures (Signed)
Extubation Procedure Note  Patient Details:   Name: Derek [name] DOB: 09-24-1945 MRN: [id number]   Airway Documentation:    Vent end date: 09/29/23 Vent end time: 1140   Evaluation  O2 sats: stable throughout Complications: No apparent complications Patient did tolerate procedure well. Bilateral Breath Sounds: Diminished  Placed on 4L nasal cannula. Pt able to speak.  Renold Genta 09/29/2023, 11:24 AM

## 2023-09-29 NOTE — Progress Notes (Addendum)
NAME:  Derek [name], MRN:  [id number], DOB:  Oct 28, 1945, LOS: 3 ADMISSION DATE:  09/26/2023, CONSULTATION DATE: 09/27/2023 REFERRING MD: Loetta Rough, MD, CHIEF COMPLAINT: hypotension   History of Present Illness:  A 78 y.o. male with multiple myeloma on chemotherapy, dyslipidemia, and GERD.  Brought in by family for increasing lower extremity edema, perineal weeping sores, and worsening generalized weakness for 1-2 weeks.  At baseline patient relatively independent.  He is experience rapid decline in function recently.  Per patient report because he is difficult to move he has been left in his urine and feces.  Per wife the patient experiences pain and does not want to be moved. Smoker. In ED, he was found to be hypotensive and given 2.5 L LR, Vanco, Rocephin, Cefepime, Levophed @ 7 mcg/min now, Rx for hyperK, and HTS for hypoNa.    Pertinent  Medical History  Multiple myeloma on chemotherapy, dyslipidemia, GERD  Significant Hospital Events: Including procedures, antibiotic start and stop dates in addition to other pertinent events   2/11 admit, CCS consult 2/12 Surgical Procedure:  LAPAROSCOPIC LOOP COLOSTOMY  EXCISIONAL SHARP DEBRIDEMENT OF 24 CM TALL X 16 CM WIDE X 4 CM DEEP SACRAL WOUND USING A SCALPEL AND ELECTROCAUTERY \ Left on vent for Afib/shock state postop 2/13 extubate     Interim History / Subjective:  Follow commands on vent Pressor needs improved  Objective   Blood pressure (!) 162/56, pulse 65, temperature 98.6 F (37 C), temperature source Esophageal, resp. rate (!) 28, height 5\' 6"  (1.676 m), weight 108.8 kg, SpO2 99%. CVP:  [10 mmHg-18 mmHg] 15 mmHg  Vent Mode: PSV FiO2 (%):  [40 %-50 %] 40 % Set Rate:  [28 bmp] 28 bmp Vt Set:  [510 mL] 510 mL PEEP:  [5 cmH20] 5 cmH20 Pressure Support:  [5 cmH20] 5 cmH20 Plateau Pressure:  [16 cmH20-24 cmH20] 19 cmH20   Intake/Output Summary (Last 24 hours) at 09/29/2023 1443 Last data filed at 09/29/2023 1023 Gross per  24 hour  Intake 1661.08 ml  Output 700 ml  Net 961.08 ml   Filed Weights   09/26/23 1423 09/28/23 0500 09/29/23 0500  Weight: 108.9 kg 108.8 kg 108.8 kg    Examination: Moves to command Ostomy looks okay Looked at back with CCS: some areas of eschar remain, large wound area Lungs sounds okay Ext warm Hypoactive BS  Cr better Sodium stable Plts down a bit WBC remains stable low  Resolved Hospital Problem list     Assessment & Plan:  Septic shock 2/2 large necrotic sacral ulcer- s/p debridement and diverting ostomy 09/28/23 Periop Afib- resolved after a couple hours amio, no recurrence, reactive Wheelchair bound, FTT Hypovolemic hyponatremia improved Mixed prerenal and septic ATN AKI Multiple Myeloma on chemo Anemia- given blood 2/11, recheck pending L femoral vein DVT- AC held given anemia and now thrombocytopenia GERD Class 3 obesity  If plts/hgb stable tomorrow would start heparin gtt for DVT Wean to extubate, diet advancement and ostomy/wound care per CCS and WOC Remains on zosyn and improving (1x dose vanc on admission), no s/s of nec fasc.  F/u wound culture data and tailor abx accordingly Appreciate palliative care helping with tough case  Best Practice (right click and "Reselect all SmartList Selections" daily)   Diet/type: per CCS DVT prophylaxis: off pending CBC stability Pressure ulcer(s): present on admission  GI prophylaxis: H2B Lines: N/A Foley:  Yes, and it is still needed Code Status:  full code Last date of multidisciplinary goals of  care discussion [PMT 09/27/23]  31 min cc time  Myrla Halsted MD PCCM

## 2023-09-29 NOTE — Progress Notes (Addendum)
1 Day Post-Op  Subjective: CC: Seen with RN and CCM Intubated, off sedation. Awakens and f/c Complains of abdominal pain. OGT clamped. No ostomy output.   Afebrile. No tachycardia or hypotension. WBC 3.7. AKI resolved. Hgb 11.1 after 2U PRBC (from 8.8). Downtrending levo needs, currently at 60mcg/min.   Objective: Vital signs in last 24 hours: Temp:  [92.1 F (33.4 C)-99.1 F (37.3 C)] 98.8 F (37.1 C) (02/13 0630) Pulse Rate:  [55-125] 70 (02/13 0630) Resp:  [14-30] 28 (02/13 0630) BP: (105-162)/(43-56) 162/56 (02/13 0434) SpO2:  [95 %-100 %] 100 % (02/13 0630) Arterial Line BP: (90-163)/(40-67) 139/50 (02/13 0630) FiO2 (%):  [40 %-100 %] 40 % (02/13 0434) Weight:  [108.8 kg] 108.8 kg (02/13 0500) Last BM Date : 09/27/23  Intake/Output from previous day: 02/12 0701 - 02/13 0700 In: 4197.2 [I.V.:2123.1; ZOXWR:604; IV Piggyback:1407.1] Out: 1370 [Urine:1350; Blood:20] Intake/Output this shift: Total I/O In: 29.5 [I.V.:29.5] Out: -   Vent Mode: PRVC FiO2 (%):  [40 %-100 %] 40 % Set Rate:  [18 bmp-28 bmp] 28 bmp Vt Set:  [510 mL] 510 mL PEEP:  [5 cmH20] 5 cmH20 Plateau Pressure:  [15 cmH20-24 cmH20] 19 cmH20  PE: Gen:  Awake on vent, f/c Abd: Soft, ND, no rigidity or guarding, stoma viable with red rubber bridge in place. Air and sweat in ostomy bag, no stool.  Ext:  No LE edema or calf tenderness Wound:  Sacral wound and left posterior thigh  Right posterior thigh     Lab Results:  Recent Labs    09/28/23 0908 09/28/23 1235 09/28/23 1428 09/29/23 0619  WBC 3.7*  --   --  3.7*  HGB 8.4*   < > 8.8* 11.1*  HCT 26.0*   < > 26.0* 33.3*  PLT 142*  --   --  96*   < > = values in this interval not displayed.   BMET Recent Labs    09/28/23 0908 09/28/23 1235 09/28/23 1428 09/29/23 0619  NA 132*   < > 133* 131*  K 4.5   < > 4.3 3.7  CL 104  --   --  103  CO2 20*  --   --  18*  GLUCOSE 103*  --   --  115*  BUN 69*  --   --  55*  CREATININE 1.81*   --   --  1.00  CALCIUM 8.6*  --   --  8.3*   < > = values in this interval not displayed.   PT/INR No results for input(s): "LABPROT", "INR" in the last 72 hours. CMP     Component Value Date/Time   NA 131 (L) 09/29/2023 0619   NA 138 08/27/2021 1028   K 3.7 09/29/2023 0619   CL 103 09/29/2023 0619   CO2 18 (L) 09/29/2023 0619   GLUCOSE 115 (H) 09/29/2023 0619   BUN 55 (H) 09/29/2023 0619   BUN 14 08/27/2021 1028   CREATININE 1.00 09/29/2023 0619   CALCIUM 8.3 (L) 09/29/2023 0619   PROT 6.3 (L) 09/26/2023 1706   PROT 8.2 08/27/2021 1028   ALBUMIN 2.5 (L) 09/26/2023 1706   ALBUMIN 3.7 08/27/2021 1028   AST 23 09/26/2023 1706   ALT 50 (H) 09/26/2023 1706   ALKPHOS 104 09/26/2023 1706   BILITOT 1.4 (H) 09/26/2023 1706   BILITOT 0.4 08/27/2021 1028   GFRNONAA >60 09/29/2023 0619   GFRAA 59 (L) 11/25/2016 1704   Lipase  No results found for: "LIPASE"  Studies/Results: Portable Chest x-ray Result Date: 09/28/2023 CLINICAL DATA:  Endotracheal tube present. EXAM: PORTABLE CHEST 1 VIEW COMPARISON:  CT yesterday. FINDINGS: Endotracheal tube tip is 5.8 cm from the carina. Tip and side port of the enteric tube below the diaphragm in the stomach. Right internal jugular central venous catheter tip overlies the brachiocephalic confluence. No pneumothorax. Increasing hazy opacity at the left lung base. No pulmonary edema. IMPRESSION: 1. Endotracheal tube tip 5.8 cm from the carina. 2. Tip of the right internal jugular central line overlies the brachiocephalic confluence. 3. Increasing hazy opacity at the left lung base, may represent atelectasis/pneumonia or pleural effusion. Electronically Signed   By: Narda Rutherford M.D.   On: 09/28/2023 16:51   DG Abd 1 View Result Date: 09/28/2023 CLINICAL DATA:  Orogastric tube placement. EXAM: ABDOMEN - 1 VIEW COMPARISON:  CT yesterday FINDINGS: Tip and side port of the enteric tube below the diaphragm in the stomach. Nonobstructive upper abdominal  bowel gas pattern. IMPRESSION: Tip and side port of the enteric tube below the diaphragm in the stomach. Electronically Signed   By: Narda Rutherford M.D.   On: 09/28/2023 16:49   VAS [place] LOWER EXTREMITY VENOUS (DVT) Result Date: 09/27/2023  Lower Venous DVT Study Patient Name:  Derek [name]  Date of Exam:   09/27/2023 Medical Rec #: [id number]       Accession #:    [id number] Date of Birth: Dec 01, 1945        Patient Gender: M Patient Age:   16 years Exam Location:  [facility] Procedure:      VAS [place] LOWER EXTREMITY VENOUS (DVT) Referring Phys: Brendolyn Patty --------------------------------------------------------------------------------  Indications: Swelling.  Risk Factors: None identified. Limitations: Body habitus, poor ultrasound/tissue interface and patient positioning, immobility, pain tolerance. Comparison Study: No prior studies. Performing Technologist: Chanda Busing RVT  Examination Guidelines: A complete evaluation includes B-mode imaging, spectral Doppler, color Doppler, and power Doppler as needed of all accessible portions of each vessel. Bilateral testing is considered an integral part of a complete examination. Limited examinations for reoccurring indications may be performed as noted. The reflux portion of the exam is performed with the patient in reverse Trendelenburg.  +---------+---------------+---------+-----------+----------+-------------------+ RIGHT    CompressibilityPhasicitySpontaneityPropertiesThrombus Aging      +---------+---------------+---------+-----------+----------+-------------------+ CFV      Full           Yes      Yes                                      +---------+---------------+---------+-----------+----------+-------------------+ SFJ      Full                                                             +---------+---------------+---------+-----------+----------+-------------------+ FV Prox  Full                                                              +---------+---------------+---------+-----------+----------+-------------------+ FV Mid  Yes      Yes                                      +---------+---------------+---------+-----------+----------+-------------------+ FV Distal               Yes      Yes                                      +---------+---------------+---------+-----------+----------+-------------------+ PFV                                                   Not well visualized +---------+---------------+---------+-----------+----------+-------------------+ POP      Full           Yes      Yes                                      +---------+---------------+---------+-----------+----------+-------------------+ PTV                                                   Not well visualized +---------+---------------+---------+-----------+----------+-------------------+ PERO                                                  Not well visualized +---------+---------------+---------+-----------+----------+-------------------+   +---------+---------------+---------+-----------+----------+-------------------+ LEFT     CompressibilityPhasicitySpontaneityPropertiesThrombus Aging      +---------+---------------+---------+-----------+----------+-------------------+ CFV      Full           Yes      Yes                                      +---------+---------------+---------+-----------+----------+-------------------+ SFJ      Full                                                             +---------+---------------+---------+-----------+----------+-------------------+ FV Prox  Full                                                             +---------+---------------+---------+-----------+----------+-------------------+ FV Mid   None           No       No                   Acute                +---------+---------------+---------+-----------+----------+-------------------+  FV DistalNone                                         Acute               +---------+---------------+---------+-----------+----------+-------------------+ PFV      Full                                                             +---------+---------------+---------+-----------+----------+-------------------+ POP                                                   Not well visualized +---------+---------------+---------+-----------+----------+-------------------+ PTV                                                   Not well visualized +---------+---------------+---------+-----------+----------+-------------------+ PERO                                                  Not well visualized +---------+---------------+---------+-----------+----------+-------------------+     Summary: RIGHT: - There is no evidence of deep vein thrombosis in the lower extremity. However, portions of this examination were limited- see technologist comments above.  - No cystic structure found in the popliteal fossa.  LEFT: - Findings consistent with acute deep vein thrombosis involving the left femoral vein.  - No cystic structure found in the popliteal fossa.  *See table(s) above for measurements and observations. Electronically signed by Lemar Livings MD on 09/27/2023 at 1:25:48 PM.    Final     Anti-infectives: Anti-infectives (From admission, onward)    Start     Dose/Rate Route Frequency Ordered Stop   09/27/23 1100  piperacillin-tazobactam (ZOSYN) IVPB 3.375 g        3.375 g 12.5 mL/hr over 240 Minutes Intravenous Every 8 hours 09/27/23 1052     09/27/23 0600  ceFEPIme (MAXIPIME) 2 g in sodium chloride 0.9 % 100 mL IVPB  Status:  Discontinued        2 g 200 mL/hr over 30 Minutes Intravenous Every 24 hours 09/27/23 0021 09/27/23 1025   09/27/23 0030  vancomycin (VANCOREADY) IVPB 2000 mg/400 mL        2,000  mg 200 mL/hr over 120 Minutes Intravenous  Once 09/27/23 0021 09/27/23 0333   09/27/23 0021  vancomycin variable dose per unstable renal function (pharmacist dosing)  Status:  Discontinued         Does not apply See admin instructions 09/27/23 0021 09/27/23 0416   09/26/23 2200  cefTRIAXone (ROCEPHIN) 2 g in sodium chloride 0.9 % 100 mL IVPB        2 g 200 mL/hr over 30 Minutes Intravenous  Once 09/26/23 2148 09/26/23 2323        Assessment/Plan POD 1 s/p laparoscopic loop colostomy, excisional sharp debridement of  sacral wound by Dr. Dossie Der on 09/28/23 - Afebrile. Pressors needs downtrending.  - Will review images with attending to ensure area of eschar do not need debridement in OR. If not plans for OR, okay to extubated from our standpoint and would proceed with medihoney/santyl over areas of eschar + BID WTD for wound care. Will also order hydrotherapy and air mattress - WOCN consult for new ostomy. Plan to remove red rubber at 5d post op.  - Appreciate CCM assistance    FEN - NPO w/ OGT currently. If extubated, okay for bedside swallow eval and if passes cld VTE - SCD's, okay for anticoagulation for DVT from our standpoint with therapeutic lovenox or heparin gtt  ID - On Zosyn   Anemia - s/p 1U 2/11, 2U 2/12. Hgb 11.1 this am.  Aspiration PNA L femoral DVT - newly diagnosed this admission Multiple myeloma on lenalidomide  Wheelchair bound HLD GERD    LOS: 3 days    Jacinto Halim , [facility] Surgery 09/29/2023, 8:40 AM Please see Amion for pager number during day hours 7:00am-4:30pm

## 2023-09-29 NOTE — Progress Notes (Signed)
Nutrition Follow-up  DOCUMENTATION CODES:   Obesity unspecified  INTERVENTION:  - Clear Liquid diet. - Boost Breeze po TID while on clear liquids. Each supplement provides 250 kcal and 9 grams of protein - Once diet advanced past clears, recommend Ensure Plus High Protein po TID Each supplement provides 350 kcal and 20 grams of protein. - Multivitamin with minerals daily.   NUTRITION DIAGNOSIS:   Increased nutrient needs related to cancer and cancer related treatments, wound healing as evidenced by estimated needs. *ongoing  GOAL:   Patient will meet greater than or equal to 90% of their needs *progressing  MONITOR:   Diet advancement, Weight trends  REASON FOR ASSESSMENT:   Consult Assessment of nutrition requirement/status  ASSESSMENT:   78 y.o. male with PMH multiple myeloma on chemotherapy, dyslipidemia, and GERD who presented for increasing lower extremity edema, perineal weeping sores, and worsening generalized weakness for 1-2 weeks.  2/11 Admit 2/12 s/p debridement of sacral wound and colostomy; returned to ICU intubated 2/13 Extubated; CLD  Patient just recently extubated at time of visit.  Wife at bedside provided nutrition history.  UBW reported to be 240# and wife states the patient has been stable in weight over the past year.  She shares he was eating very well PTA and "likes to eat". Usually ate 3 meals a day. Was about to start drinking Ensure at the recommendation of his doctor but ended up having to be admitted to the hospital.  Patient was too drowsy for a swallow test after extubation but now advanced to a clear liquid diet.  Wife feels patient would be agreeable to Ensure to support intake.  Will also add daily Multivitamin with minerals but will hold off on adding vitamin C and zinc supplementation due to patient being on chemotherapy. Will monitor for appropriateness in adding these on. Of note, palliative care consulted.   Admit weight:  240# Current weight: 239#  I&O's: +4.3L since admit  Medications reviewed and include: Colace, Miralax Levophed @ 22mcg/min  Labs reviewed:  Na 131   NUTRITION - FOCUSED PHYSICAL EXAM:  Flowsheet Row Most Recent Value  Orbital Region No depletion  Upper Arm Region No depletion  Thoracic and Lumbar Region No depletion  Buccal Region Mild depletion  Temple Region Mild depletion  Clavicle Bone Region No depletion  Clavicle and Acromion Bone Region No depletion  Scapular Bone Region Unable to assess  Dorsal Hand No depletion  Patellar Region No depletion  Anterior Thigh Region No depletion  Posterior Calf Region No depletion  Edema (RD Assessment) Moderate  Hair Reviewed  Eyes Reviewed  Mouth Reviewed  [missing teeth]  Skin Reviewed  Nails Reviewed       Diet Order:   Diet Order             Diet clear liquid Fluid consistency: Thin  Diet effective now                   EDUCATION NEEDS:  Not appropriate for education at this time  Skin:  Skin Assessment: Skin Integrity Issues: Skin Integrity Issues:: Other (Comment), Unstageable, DTI, Incisions DTI: Left Heel Unstageable: Sacrum Incisions: Abdomen Other: Venous stasis ulcer on L and R tibial  Last BM:  2/11  Height:  Ht Readings from Last 1 Encounters:  09/26/23 5\' 6"  (1.676 m)   Weight:  Wt Readings from Last 1 Encounters:  09/29/23 108.8 kg   Ideal Body Weight:  64.55 kg  BMI:  Body mass index is  38.71 kg/m.  Estimated Nutritional Needs:  Kcal:  2100-2300 kcals Protein:  110-130 grams Fluid:  >/= 2.1L    Shelle Iron RD, LDN Contact via Secure Chat.

## 2023-09-29 NOTE — Plan of Care (Signed)

## 2023-09-29 NOTE — Consult Note (Signed)
WOC team received consult for new diverting colostomy placed by Dr. Dossie Der 09/28/2023.  WOC RN will plan to assess stoma 09/30/2023 and evaluate for teaching readiness.    Thank you,    Priscella Mann MSN, RN-BC, [employer] [phone]

## 2023-09-30 DIAGNOSIS — N179 Acute kidney failure, unspecified: Secondary | ICD-10-CM | POA: Diagnosis not present

## 2023-09-30 DIAGNOSIS — R6521 Severe sepsis with septic shock: Secondary | ICD-10-CM | POA: Diagnosis not present

## 2023-09-30 DIAGNOSIS — A419 Sepsis, unspecified organism: Secondary | ICD-10-CM | POA: Diagnosis not present

## 2023-09-30 DIAGNOSIS — L8942 Pressure ulcer of contiguous site of back, buttock and hip, stage 2: Secondary | ICD-10-CM | POA: Diagnosis not present

## 2023-09-30 LAB — CBC
HCT: 32.2 % — ABNORMAL LOW (ref 39.0–52.0)
Hemoglobin: 10.6 g/dL — ABNORMAL LOW (ref 13.0–17.0)
MCH: 31.1 pg (ref 26.0–34.0)
MCHC: 32.9 g/dL (ref 30.0–36.0)
MCV: 94.4 fL (ref 80.0–100.0)
Platelets: 42 10*3/uL — ABNORMAL LOW (ref 150–400)
RBC: 3.41 MIL/uL — ABNORMAL LOW (ref 4.22–5.81)
RDW: 16.7 % — ABNORMAL HIGH (ref 11.5–15.5)
WBC: 4 10*3/uL (ref 4.0–10.5)
nRBC: 0 % (ref 0.0–0.2)

## 2023-09-30 LAB — GLUCOSE, CAPILLARY
Glucose-Capillary: 121 mg/dL — ABNORMAL HIGH (ref 70–99)
Glucose-Capillary: 104 mg/dL — ABNORMAL HIGH (ref 70–99)
Glucose-Capillary: 178 mg/dL — ABNORMAL HIGH (ref 70–99)
Glucose-Capillary: 178 mg/dL — ABNORMAL HIGH (ref 70–99)
Glucose-Capillary: 94 mg/dL (ref 70–99)

## 2023-09-30 LAB — BASIC METABOLIC PANEL
Anion gap: 10 (ref 5–15)
BUN: 53 mg/dL — ABNORMAL HIGH (ref 8–23)
CO2: 19 mmol/L — ABNORMAL LOW (ref 22–32)
Calcium: 8.8 mg/dL — ABNORMAL LOW (ref 8.9–10.3)
Chloride: 104 mmol/L (ref 98–111)
Creatinine, Ser: 0.97 mg/dL (ref 0.61–1.24)
GFR, Estimated: >60 mL/min (ref >60)
Glucose, Bld: 133 mg/dL — ABNORMAL HIGH (ref 70–99)
Potassium: 4 mmol/L (ref 3.5–5.1)
Sodium: 133 mmol/L — ABNORMAL LOW (ref 135–145)

## 2023-09-30 LAB — PHOSPHORUS: Phosphorus: 2.7 mg/dL (ref 2.5–4.6)

## 2023-09-30 LAB — MAGNESIUM: Magnesium: 2.2 mg/dL (ref 1.7–2.4)

## 2023-09-30 MED ORDER — DOCUSATE SODIUM 100 MG PO CAPS
100.0000 mg | ORAL_CAPSULE | Freq: Two times a day (BID) | ORAL | Status: DC
Start: 2023-09-30 — End: 2023-10-07
  Administered 2023-09-30 – 2023-10-07 (×13): 100 mg via ORAL
  Filled 2023-09-30 (×13): qty 1

## 2023-09-30 MED ORDER — FENTANYL CITRATE PF 50 MCG/ML IJ SOSY
50.0000 ug | PREFILLED_SYRINGE | INTRAMUSCULAR | Status: DC | PRN
  Administered 2023-09-30 – 2023-10-14 (×22): 50 ug via INTRAVENOUS
  Filled 2023-09-30 (×23): qty 1

## 2023-09-30 MED ORDER — AMIODARONE HCL IN DEXTROSE 360-4.14 MG/200ML-% IV SOLN
30.0000 mg/h | INTRAVENOUS | Status: DC
  Administered 2023-10-01 – 2023-10-05 (×9): 30 mg/h via INTRAVENOUS
  Filled 2023-09-30 (×8): qty 200

## 2023-09-30 MED ORDER — AMIODARONE LOAD VIA INFUSION
150.0000 mg | Freq: Once | INTRAVENOUS | Status: AC
  Administered 2023-09-30: 150 mg via INTRAVENOUS
  Filled 2023-09-30: qty 83.34

## 2023-09-30 MED ORDER — AMIODARONE HCL IN DEXTROSE 360-4.14 MG/200ML-% IV SOLN
60.0000 mg/h | INTRAVENOUS | Status: AC
Start: 2023-09-30 — End: 2023-10-01
  Administered 2023-09-30 (×2): 60 mg/h via INTRAVENOUS
  Filled 2023-09-30 (×2): qty 200

## 2023-09-30 MED ORDER — POLYETHYLENE GLYCOL 3350 17 G PO PACK
17.0000 g | PACK | Freq: Every day | ORAL | Status: DC
  Administered 2023-09-30 – 2023-10-06 (×6): 17 g via ORAL
  Filled 2023-09-30 (×6): qty 1

## 2023-09-30 MED ORDER — INSULIN ASPART 100 UNIT/ML IJ SOLN
1.0000 [IU] | Freq: Three times a day (TID) | INTRAMUSCULAR | Status: DC
  Administered 2023-09-30 (×2): 2 [IU] via SUBCUTANEOUS
  Administered 2023-10-01: 1 [IU] via SUBCUTANEOUS
  Administered 2023-10-01: 3 [IU] via SUBCUTANEOUS

## 2023-09-30 NOTE — Evaluation (Addendum)
Physical Therapy Evaluation Patient Details Name: Derek [name] MRN: [id number] DOB: 06-Jun-1946 Today's Date: 09/30/2023  History of Present Illness  Pt is a 78 year old male admitted on 09/27/23 with sepsis.  Pt s/p LAPAROSCOPIC LOOP COLOSTOMY and EXCISIONAL SHARP DEBRIDEMENT OF 24 CM TALL X 16 CM WIDE X 4 CM DEEP SACRAL WOUND USING A SCALPEL AND ELECTROCAUTERY on 09/28/23.  Pt was left on vent for Afib/shock state postop.  Pt extubated 09/29/23. PMH Includes but is not limited to ca, GERD, fall, HLD, L TKA, TLIF L4-l5, ACDF and periprosthetic ORIF of LLE on 12/05/2022. Recent diagnosis of chronic cervical and lower brainstem myelomalacia, on chemotherapy.  Clinical Impression  Pt admitted with above diagnosis.  Pt currently with functional limitations due to the deficits listed below (see PT Problem List). Pt will benefit from acute skilled PT to increase their independence and safety with mobility to allow discharge.  Pt and family report pt is typically independent at w/c level.  Pt able to perform transfer however has been requiring more assist the past 1-2 weeks.  Pt requiring total assist +2 to +3 for rolling and positioning in bed at this time. Patient will benefit from continued inpatient follow up therapy, <3 hours/day. Discussed pressure relief to wound area with family and encouraged frequent turning.  Pt also to receive pressure relief air mattress (in orders).         If plan is discharge home, recommend the following: Two people to help with walking and/or transfers;Two people to help with bathing/dressing/bathroom   Can travel by private vehicle   No    Equipment Recommendations Other (comment) (defer to next venue, if home will need pressure prevention such as w/c cushion and air mattress)  Recommendations for Other Services       Functional Status Assessment Patient has had a recent decline in their functional status and demonstrates the ability to make significant improvements  in function in a reasonable and predictable amount of time.     Precautions / Restrictions Precautions Precautions: Fall Precaution/Restrictions Comments: Art Line. Multiple Lines; Ostomy      Mobility  Bed Mobility Overal bed mobility: Needs Assistance Bed Mobility: Rolling Rolling: Total assist, +2 for physical assistance         General bed mobility comments: pt not able to reach across midline to assist with upper body, reliant on total assist    Transfers                   General transfer comment: NT at this time, fatigue, weakness and pt remains on multiple meds    Ambulation/Gait                  Stairs            Wheelchair Mobility     Tilt Bed    Modified Rankin (Stroke Patients Only)       Balance                                             Pertinent Vitals/Pain Pain Assessment Pain Assessment: Faces Faces Pain Scale: Hurts even more Pain Location: wound area Pain Descriptors / Indicators: Sore Pain Intervention(s): Repositioned, Monitored during session, Premedicated before session    Home Living Family/patient expects to be discharged to:: Skilled nursing facility Living Arrangements: Spouse/significant other Available Help at Discharge: Friend(s);Available  PRN/intermittently;Family;Personal care attendant Type of Home: House Home Access: Ramped entrance       Home Layout: One level Home Equipment: Rollator (4 wheels);Shower seat;BSC/3in1;Grab bars - tub/shower;Hand held shower head;Wheelchair - Pharmacist, hospital Comments: reports spouse has been assisting lately 1-2 weeks has been having difficulty mobilizing independently    Prior Function Prior Level of Function : Needs assist;Driving       Physical Assist : Mobility (physical);ADLs (physical) Mobility (physical): Transfers;Bed mobility;Gait ADLs (physical): Bathing;Dressing;Toileting;IADLs Mobility Comments: pt reports  using motorized scooter for home and community navigation and has been non ambulatory sice 11/2022 following LLE fx and ORIF, pt states his wife helps him in and out of bed and swings him from the bed to the scooter and scooter to commode ADLs Comments: pt states that his wife helped him with all of his bathing, lower body dressing and IADLs.     Extremity/Trunk Assessment        Lower Extremity Assessment Lower Extremity Assessment: RLE deficits/detail;LLE deficits/detail RLE Deficits / Details: grossly 2+/5 throughout (limited AROM against gravity) LLE Deficits / Details: grossly 2-/5 (muscle contractions observed against gravity however no AROM)    Cervical / Trunk Assessment Cervical / Trunk Assessment: Other exceptions Cervical / Trunk Exceptions: increased left lateral trunk flexion, left cervical rotation and left cervial lateral flexion observed in resting position  Communication   Communication Communication: No apparent difficulties    Cognition Arousal: Alert Behavior During Therapy: WFL for tasks assessed/performed                           PT - Cognition Comments: IV premed for session, pt able to answer questions however defers to spouse, attempting to assist as able with multimodal cues Following commands: Intact       Cueing       General Comments      Exercises     Assessment/Plan    PT Assessment Patient needs continued PT services  PT Problem List Decreased strength;Pain;Decreased activity tolerance;Decreased balance;Decreased mobility;Decreased knowledge of use of DME;Obesity;Decreased skin integrity;Decreased cognition       PT Treatment Interventions DME instruction;Balance training;Functional mobility training;Therapeutic activities;Therapeutic exercise;Wheelchair mobility training;Patient/family education;Neuromuscular re-education    PT Goals (Current goals can be found in the Care Plan section)  Acute Rehab PT Goals PT Goal  Formulation: With patient/family Time For Goal Achievement: 10/14/23 Potential to Achieve Goals: Fair    Frequency Min 1X/week     Co-evaluation               AM-PAC PT "6 Clicks" Mobility  Outcome Measure Help needed turning from your back to your side while in a flat bed without using bedrails?: Total Help needed moving from lying on your back to sitting on the side of a flat bed without using bedrails?: Total Help needed moving to and from a bed to a chair (including a wheelchair)?: Total Help needed standing up from a chair using your arms (e.g., wheelchair or bedside chair)?: Total Help needed to walk in hospital room?: Total Help needed climbing 3-5 steps with a railing? : Total 6 Click Score: 6    End of Session   Activity Tolerance: Patient limited by fatigue Patient left: in bed;with call bell/phone within reach;with family/visitor present Nurse Communication: Mobility status PT Visit Diagnosis: Muscle weakness (generalized) (M62.81);Other abnormalities of gait and mobility (R26.89)    Time: 0981-1914 PT Time Calculation (min) (ACUTE ONLY): 14 min   Charges:  PT Evaluation $PT Eval Low Complexity: 1 Low   PT General Charges $$ ACUTE PT VISIT: 1 Visit        Thomasene Mohair PT, DPT Physical Therapist Acute Rehabilitation Services Office: [phone]   [name] 09/30/2023, 1:43 PM

## 2023-09-30 NOTE — Progress Notes (Addendum)
 eLink Physician-Brief Progress Note Patient Name: Aubert Choyce DOB: 10-24-1945 MRN: [id number]   Date of Service  09/30/2023  HPI/Events of Note  Patient undergoing wound care and popped into A-fib with RVR with rates in the 150s.  Already on norepinephrine at low-dose.  eICU Interventions  Add on amiodarone bolus and infusion   0204 -  HR 130-160 Afib.  LR bolus ordered.  4098 -patient has completed with bolus remains tachycardic.  Rate seems to be marginally better but marginally better.  Repeat bolus.  At that point, maintain amiodarone infusion without further intervention as it can take some time for amiodarone to reach its full effect  Intervention Category Intermediate Interventions: Arrhythmia - evaluation and management  Jamill Wetmore 09/30/2023, 10:03 PM

## 2023-09-30 NOTE — Progress Notes (Addendum)
NAME:  Derek [name], MRN:  [id number], DOB:  1946-03-10, LOS: 4 ADMISSION DATE:  09/26/2023, CONSULTATION DATE: 09/27/2023 REFERRING MD: Loetta Rough, MD, CHIEF COMPLAINT: hypotension   History of Present Illness:  A 78 y.o. male with multiple myeloma on chemotherapy, dyslipidemia, and GERD.  Brought in by family for increasing lower extremity edema, perineal weeping sores, and worsening generalized weakness for 1-2 weeks.  At baseline patient relatively independent.  He is experience rapid decline in function recently.  Per patient report because he is difficult to move he has been left in his urine and feces.  Per wife the patient experiences pain and does not want to be moved. Smoker. In ED, he was found to be hypotensive and given 2.5 L LR, Vanco, Rocephin, Cefepime, Levophed @ 7 mcg/min now, Rx for hyperK, and HTS for hypoNa.    Pertinent  Medical History  Multiple myeloma on chemotherapy, dyslipidemia, GERD  Significant Hospital Events: Including procedures, antibiotic start and stop dates in addition to other pertinent events   2/11 admit, [facility] consult 2/12 Surgical Procedure:  LAPAROSCOPIC LOOP COLOSTOMY  EXCISIONAL SHARP DEBRIDEMENT OF 24 CM TALL X 16 CM WIDE X 4 CM DEEP SACRAL WOUND USING A SCALPEL AND ELECTROCAUTERY \ Left on vent for Afib/shock state postop 2/13 extubate     Interim History / Subjective:  Follow commands on vent Pressor needs improved  Objective   Blood pressure (!) 108/45, pulse 93, temperature 98.9 F (37.2 C), temperature source Oral, resp. rate 14, height 5\' 6"  (1.676 m), weight 108.8 kg, SpO2 96%. CVP:  [4 mmHg-17 mmHg] 9 mmHg      Intake/Output Summary (Last 24 hours) at 09/30/2023 0927 Last data filed at 09/30/2023 0910 Gross per 24 hour  Intake 714.15 ml  Output 1675 ml  Net -960.85 ml   Filed Weights   09/26/23 1423 09/28/23 0500 09/29/23 0500  Weight: 108.9 kg 108.8 kg 108.8 kg    Examination: General chronically ill male laying in  bed no distress HENT NCAT no JVD  Pulm dec bases no accessory use  Card rrr w/out MRG Abd soft. Stoma w/ brown liquid stool. Ostomy is pink. Tolerating orals Ext generalized anasarca warm pulses strong Neuro awake. Follows commands oriented x 3 generalized weakness  Resolved Hospital Problem list     Assessment & Plan:  Septic shock 2/2 large necrotic sacral ulcer- s/p debridement and diverting ostomy 09/28/23 -still pressor dep as on 2/14, was transiently off on 2/13, currently + 3.3 liters since admit. Afebrile, wbc pending -cultures still pending. MRSA PCR neg Plan Keep euvolemic  Cont to wean NE for SBP > 100 Cortisol >30 so no role for steroids Day 4 zosyn; will continue as we await cultures  Wound care as directed by surgical team "medihoney/santyl over areas of eschar + BID WTD for wound care" Hydrotherapy  Air mattress WOCN consult for ostomy  Cl liq diet for now, further advancement per surgical team   Hypovolemic hyponatremia improved Plan Monitor Avoid volume depletion   Mixed prerenal and septic ATN AKI Renal fxn improved.  Plan Careful to avoid clinically sig hypotension (SBP goal > 100) Renal dose meds Ensure remains euvolemic Am chem  Strict I&O  Mild NAGMA. Mild and stable Plan Am chem May need to consider oral bicarb if gets worse w/ renal recovery   Periop Afib- resolved after a couple hours amio, no recurrence, reactive Plan Cont tele  Rate control   Anemia & thrombocytopenia - anemia likely post-op and  thrombocytopenia explained by sepsis Got blood 2/11, no obvious bleeding Plan Trend cbc Transfuse for hgb < 7 or active bleeding  Left femoral DVT AC was held d/t anemia and thrombocytopenia Plan Awaiting CBC if PLTs stable will start IV heparin no bolus   Wheelchair bound, FTT w/ h/o Multiple Myeloma on chemo Plan Palliative following  Supportive care  GERD Plan PPI  Class 3 obesity Plan RD to follow    Best Practice (right  click and "Reselect all SmartList Selections" daily)   Diet/type: per [facility]CS DVT prophylaxis: off pending CBC stability w/ plan to start IV heparin Pressure ulcer(s): present on admission  GI prophylaxis: oral ppi  Lines: N/A Foley:  Yes, and it is still needed Code Status:  full code Last date of multidisciplinary goals of care discussion [PMT 09/27/23]  My cct 34 min

## 2023-09-30 NOTE — Progress Notes (Addendum)
2 Days Post-Op  Subjective: CC: Seen with RN Extubated. Reports no current pain.  No abdominal pain, n/v with cld. Having ostomy output.   Afebrile. No current tachycardia or hypotension. Remains on 97mcg/min of levo. WBC 4. Hgb 10.6 (11.1).  Addendum: I returned to patients room for hydrotherapy. On 59mcg/min of levo.    Objective: Vital signs in last 24 hours: Temp:  [96.8 F (36 C)-98.4 F (36.9 C)] 97.6 F (36.4 C) (02/14 0300) Pulse Rate:  [70-90] 87 (02/14 0500) Resp:  [12-21] 14 (02/14 0500) SpO2:  [95 %-100 %] 95 % (02/14 0500) Arterial Line BP: (82-121)/(44-83) 109/50 (02/14 0500) FiO2 (%):  [40 %] 40 % (02/13 0857) Last BM Date : 09/29/23  Intake/Output from previous day: 02/13 0701 - 02/14 0700 In: 636 [I.V.:372.8; IV Piggyback:263.2] Out: 1675 [Urine:1175; Stool:500] Intake/Output this shift: No intake/output data recorded.  Vent Mode: PSV FiO2 (%):  [40 %] 40 % PEEP:  [5 cmH20] 5 cmH20 Pressure Support:  [5 cmH20] 5 cmH20  PE: Gen:  Awake on vent, f/c Abd: Soft, ND, NT, no rigidity or guarding, stoma viable with red rubber bridge in place with air and stool in ostomy bag Ext:  No LE edema or calf tenderness Wound:  Seen with hydrotherapy. Wound as below with some granulation tissue but mostly non-viable appearing fibrinous tissue and areas of soft eschar.    Lab Results:  Recent Labs    09/28/23 0908 09/28/23 1235 09/28/23 1428 09/29/23 0619  WBC 3.7*  --   --  3.7*  HGB 8.4*   < > 8.8* 11.1*  HCT 26.0*   < > 26.0* 33.3*  PLT 142*  --   --  96*   < > = values in this interval not displayed.   BMET Recent Labs    09/29/23 0619 09/30/23 0414  NA 131* 133*  K 3.7 4.0  CL 103 104  CO2 18* 19*  GLUCOSE 115* 133*  BUN 55* 53*  CREATININE 1.00 0.97  CALCIUM 8.3* 8.8*   PT/INR No results for input(s): "LABPROT", "INR" in the last 72 hours. CMP     Component Value Date/Time   NA 133 (L) 09/30/2023 0414   NA 138 08/27/2021 1028   K  4.0 09/30/2023 0414   CL 104 09/30/2023 0414   CO2 19 (L) 09/30/2023 0414   GLUCOSE 133 (H) 09/30/2023 0414   BUN 53 (H) 09/30/2023 0414   BUN 14 08/27/2021 1028   CREATININE 0.97 09/30/2023 0414   CALCIUM 8.8 (L) 09/30/2023 0414   PROT 6.3 (L) 09/26/2023 1706   PROT 8.2 08/27/2021 1028   ALBUMIN 2.5 (L) 09/26/2023 1706   ALBUMIN 3.7 08/27/2021 1028   AST 23 09/26/2023 1706   ALT 50 (H) 09/26/2023 1706   ALKPHOS 104 09/26/2023 1706   BILITOT 1.4 (H) 09/26/2023 1706   BILITOT 0.4 08/27/2021 1028   GFRNONAA >60 09/30/2023 0414   GFRAA 59 (L) 11/25/2016 1704   Lipase  No results found for: "LIPASE"  Studies/Results: Portable Chest x-ray Result Date: 09/28/2023 CLINICAL DATA:  Endotracheal tube present. EXAM: PORTABLE CHEST 1 VIEW COMPARISON:  CT yesterday. FINDINGS: Endotracheal tube tip is 5.8 cm from the carina. Tip and side port of the enteric tube below the diaphragm in the stomach. Right internal jugular central venous catheter tip overlies the brachiocephalic confluence. No pneumothorax. Increasing hazy opacity at the left lung base. No pulmonary edema. IMPRESSION: 1. Endotracheal tube tip 5.8 cm from the carina. 2. Tip  of the right internal jugular central line overlies the brachiocephalic confluence. 3. Increasing hazy opacity at the left lung base, may represent atelectasis/pneumonia or pleural effusion. Electronically Signed   By: Narda Rutherford M.D.   On: 09/28/2023 16:51   DG Abd 1 View Result Date: 09/28/2023 CLINICAL DATA:  Orogastric tube placement. EXAM: ABDOMEN - 1 VIEW COMPARISON:  CT yesterday FINDINGS: Tip and side port of the enteric tube below the diaphragm in the stomach. Nonobstructive upper abdominal bowel gas pattern. IMPRESSION: Tip and side port of the enteric tube below the diaphragm in the stomach. Electronically Signed   By: Narda Rutherford M.D.   On: 09/28/2023 16:49    Anti-infectives: Anti-infectives (From admission, onward)    Start     Dose/Rate  Route Frequency Ordered Stop   09/27/23 1100  piperacillin-tazobactam (ZOSYN) IVPB 3.375 g        3.375 g 12.5 mL/hr over 240 Minutes Intravenous Every 8 hours 09/27/23 1052     09/27/23 0600  ceFEPIme (MAXIPIME) 2 g in sodium chloride 0.9 % 100 mL IVPB  Status:  Discontinued        2 g 200 mL/hr over 30 Minutes Intravenous Every 24 hours 09/27/23 0021 09/27/23 1025   09/27/23 0030  vancomycin (VANCOREADY) IVPB 2000 mg/400 mL        2,000 mg 200 mL/hr over 120 Minutes Intravenous  Once 09/27/23 0021 09/27/23 0333   09/27/23 0021  vancomycin variable dose per unstable renal function (pharmacist dosing)  Status:  Discontinued         Does not apply See admin instructions 09/27/23 0021 09/27/23 0416   09/26/23 2200  cefTRIAXone (ROCEPHIN) 2 g in sodium chloride 0.9 % 100 mL IVPB        2 g 200 mL/hr over 30 Minutes Intravenous  Once 09/26/23 2148 09/26/23 2323        Assessment/Plan POD 2 s/p laparoscopic loop colostomy, excisional sharp debridement of sacral wound by Dr. Dossie Der on 09/28/23 - Afebrile. WBC 4. Still on 54mcg/min of levo - Cont medihoney/santyl over areas of eschar + BID WTD for wound care. If wound does not clean up with hydrotherapy and local wound care may need to go back to OR - PT hydrotherapy  - Air mattress - WOCN consult for new ostomy. Plan to remove red rubber at 5d post op.  - Appreciate CCM assistance    FEN - Would not adv past cld while remains on pressors VTE - SCD's, okay for anticoagulation for DVT from our standpoint with therapeutic lovenox or heparin gtt  ID - On Zosyn. Cx pending  Foley - good uop at 0.9 ml/kg/hr over the last 12 hours. Okay to d/c from our standpoint.   Anemia - s/p 1U 2/11, 2U 2/12. Hgb 10.6 from 11.1 Aspiration PNA L femoral DVT - newly diagnosed this admission Multiple myeloma on lenalidomide  Wheelchair bound HLD GERD    LOS: 4 days    Jacinto Halim , [facility] 09/30/2023, 8:30 AM Please  see Amion for pager number during day hours 7:00am-4:30pm

## 2023-09-30 NOTE — Evaluation (Signed)
Occupational Therapy Evaluation Patient Details Name: Derek [name] MRN: [id number] DOB: 1946/07/21 Today's Date: 09/30/2023   History of Present Illness   78 yo male presented to [facility] on 09/27/23 who arrived withsepsis and underwent Debridement of Large sacral wound and lap loop diverting colostomy 09/28/23.  Per Op-Note: "The patient remained intubated as a entered A-fib with RVR during the case and required amiodarone both bolus and ongoing resuscitation by the anesthesia team.  It was felt best to transfer the patient back to the ICU intubated to continue to stabilize him as he is likely having a SIRS response from the sacral wound debridement."  Pt was extubated on 09/29/23.    Pt PMH Includes but is not limited to ca, GERD, fall, HLD,   L TKA, TLIF L4-l5, ACDF and periprosthetic ORIF of LLE on 12/05/2022. Recent diagnosis of chronic cervical and lower brainstem myelomalacia, on chemotherapy.     Clinical Impressions Patient is currently requiring assistance with ADLs including Total assist with Lower body ADLs in bed level position, and Total assist with Upper body ADLs in bed position, as well as Total assist with very limited bed repositioning to bring upper body to midline of be with LT lateral lean/slump, and inability to perform functional transfers at this time due to multiple lines and need of at least 2-3 people for safety.    Current level of function is below patient's typical baseline in which pt is nonambulatory but able to perform many of his ADLs without assistance.     During this evaluation, patient was limited by profound generalized weakness with inability to use Ues functional for self feeding of any ADLs/mobility, large sacral wound which will complicate sitting time and any shearing on bed for mobility, impaired activity tolerance, body habitus and multiple lines, all of which has the potential to impact patient's and/or caregivers' safety and independence during functional  mobility, as well as performance for ADLs.    Patient lives with his spouse who was not present in room this morning. Per chart, pt with a PCA 4 hours a day 3 days a week, who, along with wife assisted at baseline with bathing pt, LB dressing and all IADLs.  Patient demonstrates fair rehab potential, and should benefit from continued skilled occupational therapy services while in acute care to maximize safety, independence and quality of life at home.  Continued occupational therapy services are recommended. Patient will benefit from continued inpatient follow up therapy, <3 hours/day vs LTACH with continued PT/OT.    ?  ?      If plan is discharge home, recommend the following:   A lot of help with bathing/dressing/bathroom;Two people to help with bathing/dressing/bathroom;A lot of help with walking and/or transfers;Two people to help with walking and/or transfers     Functional Status Assessment   Patient has had a recent decline in their functional status and demonstrates the ability to make significant improvements in function in a reasonable and predictable amount of time.     Equipment Recommendations   Other (comment) (Defer for now)     Recommendations for Other Services         Precautions/Restrictions   Precautions Precautions: Fall Precaution/Restrictions Comments: Art Line. Multiple Lines; Ostomy Restrictions Weight Bearing Restrictions Per Provider Order: No Other Position/Activity Restrictions: Clear Liquid Diet     Mobility Bed Mobility Overal bed mobility:  (Total Assist to laterally scoot upper body to midlone of bed in supine.)  Transfers                          Balance                                           ADL either performed or assessed with clinical judgement   ADL Overall ADL's : Needs assistance/impaired (Pt is currently Total Assist for ALL ADLs at bed level including  feeding. +2-3 people for any mobility due debility and due to lines.) Eating/Feeding: Total assistance Eating/Feeding Details (indicate cue type and reason): RN notified of pt coughing aftger sipping chicken broth trhough straw with HOB elevated. Pt did not follow cues to slow down sips. Grooming: Total assistance   Upper Body Bathing: Total assistance   Lower Body Bathing: Total assistance;+2 for physical assistance;+2 for safety/equipment   Upper Body Dressing : Total assistance   Lower Body Dressing: Total assistance;+2 for physical assistance;+2 for safety/equipment     Toilet Transfer Details (indicate cue type and reason): Unable Toileting- Clothing Manipulation and Hygiene: Total assistance;+2 for physical assistance;+2 for safety/equipment;Bed level       Functional mobility during ADLs: Total assistance;+2 for physical assistance;+2 for safety/equipment General ADL Comments: Total Assist to bring upper body to midline but could not sustain with continued LT lateral leaning vs pushing.     Vision Baseline Vision/History: 0 No visual deficits Additional Comments: Able to read clock on wall accurately.     Perception Perception: Impaired   Perception-Other Comments: May have some RT inaattention vs muscle tightness/imbalance disallowing pt to turn or flex head to RT.   Praxis         Pertinent Vitals/Pain Pain Assessment Pain Assessment: PAINAD Breathing: normal Negative Vocalization: none Facial Expression: sad, frightened, frown (With lateral neck stretch. OT eased up on stretch and pt smiling\) Body Language: relaxed Consolability: no need to console PAINAD Score: 1     Extremity/Trunk Assessment Upper Extremity Assessment Upper Extremity Assessment: Right hand dominant;RUE deficits/detail;LUE deficits/detail RUE Deficits / Details: Edematous, esp hand and UE elevated by Nursing. PROM to shoulder to 100 and in all planes without resistance. AAROM elbow: WFL.  AROM wrist and fist impeded by edema and grossly 3-/5. RUE Coordination: decreased fine motor LUE Deficits / Details: PROM to shoulder to 90 (tight at lateral LT neck area with head positioned to LT) without resistance. AAROM elbow: WFL. AROM wrist and fist WFL. Fist weak ~3+/5 but unablet o hold feeding unsils or cups safely. LUE Coordination: decreased fine motor       Cervical / Trunk Assessment Cervical / Trunk Assessment: Other exceptions Cervical / Trunk Exceptions: Strong LT lean in bed with head also positioned laterally to LT. With slow stretch and deep pressure to LT upper traps, able to bring head to neutral but not beyond. Pt also with LT gaze and limited head rotation to RT having to pensate with eye movement.   Communication Communication Communication: No apparent difficulties   Cognition Arousal: Alert Behavior During Therapy: WFL for tasks assessed/performed Cognition: Difficult to assess (Evaluation focused today on positioning, feeding and UE function. Can assess cognition in more depth next visit)             OT - Cognition Comments: Pt giving simple answers to questions like, "Alright!". Following 1-2 step instructions.  Following commands: Intact       Cueing  General Comments          Exercises     Shoulder Instructions      Home Living Family/patient expects to be discharged to:: Skilled nursing facility (All below Information taken from Previous evaluation on 07/30/2023.) Living Arrangements: Spouse/significant other Available Help at Discharge: Friend(s);Available PRN/intermittently;Family;Personal care attendant Type of Home: House Home Access: Ramped entrance     Home Layout: One level     Bathroom Shower/Tub: Chief Strategy Officer: Standard Bathroom Accessibility: Yes   Home Equipment: Rollator (4 wheels);Shower seat;BSC/3in1;Grab bars - tub/shower;Hand held shower head;Wheelchair - Water quality scientist Comments: Pt states he has two friends at home who can assist 24/7 if needed      Prior Functioning/Environment Prior Level of Function : Needs assist;Driving       Physical Assist : Mobility (physical);ADLs (physical) Mobility (physical): Transfers;Bed mobility;Gait ADLs (physical): Bathing;Dressing;Toileting;IADLs Mobility Comments: pt reports using motorized scooter for home and community navigation and has been non ambulatory sice 11/2022 following LLE fx and ORIF, pt states his wife helps him in and out of bed and swings him from the bed to the scooter and scooter to commode, pt sates that he is able to drive.  Update to 09/30/23: Pt reports that he is non-ambulatory and uses his scooter for mobility. ADLs Comments: pt states that his wife helped him with all of his bathing, lower body dressing and IADLs.    OT Problem List: Decreased coordination;Increased edema;Cardiopulmonary status limiting activity;Impaired UE functional use;Decreased knowledge of use of DME or AE;Decreased activity tolerance;Decreased range of motion;Obesity;Decreased strength   OT Treatment/Interventions: Self-care/ADL training;Therapeutic activities;Cognitive remediation/compensation;Therapeutic exercise;Neuromuscular education;Patient/family education;Balance training;DME and/or AE instruction;Manual therapy      OT Goals(Current goals can be found in the care plan section)   Acute Rehab OT Goals OT Goal Formulation: Patient unable to participate in goal setting Time For Goal Achievement: 10/14/23 Potential to Achieve Goals: Fair ADL Goals Pt Will Perform Eating: with mod assist;with adaptive utensils (chair or bed chair position) Pt Will Perform Grooming: with mod assist;sitting;bed level (EOB or bed level if unable) Pt/caregiver will Perform Home Exercise Program: Increased ROM;With minimal assist;Increased strength;Both right and left upper extremity (and reduce edema) Additional  ADL Goal #1: Pt will sit EOB for 10 min with VSS and no more than Mod As for sitting balance with BUE support, in order to work on pre-transfer core strengthening and balance. Additional ADL Goal #2: Pt will perform rolling with Mod As of 1 and supine<>Sit with Mod As of 2 persons in order to increase accessibility to pressure relief positions in bed.   OT Frequency:  Min 1X/week    Co-evaluation              AM-PAC OT "6 Clicks" Daily Activity     Outcome Measure Help from another person eating meals?: Total Help from another person taking care of personal grooming?: Total Help from another person toileting, which includes using toliet, bedpan, or urinal?: Total Help from another person bathing (including washing, rinsing, drying)?: Total Help from another person to put on and taking off regular upper body clothing?: Total Help from another person to put on and taking off regular lower body clothing?: Total 6 Click Score: 6   End of Session Nurse Communication: Other (comment) (RNs with OT in room majoirty of time spent.)  Activity Tolerance: Treatment limited secondary to medical complications (Comment);Other (comment) (  Art Line and Multiple lines) Patient left: in bed;with nursing/sitter in room  OT Visit Diagnosis: Muscle weakness (generalized) (M62.81);Other abnormalities of gait and mobility (R26.89);Unsteadiness on feet (R26.81);Feeding difficulties (R63.3)                Time: 1610-9604 OT Time Calculation (min): 30 min Charges:  OT General Charges $OT Visit: 1 Visit OT Evaluation $OT Eval Low Complexity: 1 Low OT Treatments $Self Care/Home Management : 8-22 mins  Victorino Dike, OT Acute Rehab Services Office: [phone] 09/30/2023   Theodoro Clock 09/30/2023, 10:04 AM

## 2023-09-30 NOTE — Progress Notes (Addendum)
Physical Therapy Wound Evaluation Patient Details  Name: Derek [name] MRN: [id number] Date of Birth: June 27, 1946  Today's Date: 09/30/2023 Time: 1100-1155 Time Calculation (min): 55 min  Subjective  Subjective Assessment Subjective: Pt is a 78 year old male admitted on 09/27/23 with sepsis. Pt with Necrotic sacral, perineal, scrotal, and posterior thigh pressure wounds. Pt s/p LAPAROSCOPIC LOOP COLOSTOMY and EXCISIONAL SHARP DEBRIDEMENT OF 24 CM TALL X 16 CM WIDE X 4 CM DEEP SACRAL WOUND USING A SCALPEL AND ELECTROCAUTERY on 09/28/23. Pt was left on vent for Afib/shock state postop. Pt extubated 09/29/23. PMH Includes but is not limited to ca, GERD, fall, HLD, L TKA, TLIF L4-l5, ACDF and periprosthetic ORIF of LLE on 12/05/2022. Recent diagnosis of chronic cervical and lower brainstem myelomalacia, on chemotherapy. Date of Onset:  (Present on admission) Prior Treatments: none until this admission  Pain Score:  Pt premedicated with IV pain meds.  Pain mostly with rolling and repositioning.  Wound Assessment                                                                                                   Pressure Injury 09/30/23 Sacrum Mid;Lower Unstageable - Full thickness tissue loss in which the base of the injury is covered by slough (yellow, tan, gray, green or brown) and/or eschar (tan, brown or black) in the wound bed. extensive sacral wound (Active)  Dressing Type Gauze (Comment);ABD;Honey;Barrier Film (skin prep);Normal saline moist dressing 09/30/23 1400  Dressing Changed 09/30/23 1400  Dressing Change Frequency Twice a day 09/30/23 1400  State of Healing Eschar 09/30/23 1400  Site / Wound Assessment Red;Painful;Brown;Black;Yellow 09/30/23 1400  % Wound base Red or Granulating 10% 09/30/23 1400  % Wound base Yellow/Fibrinous Exudate 10% 09/30/23 1400  % Wound base Black/Eschar 80% 09/30/23 1400  Peri-wound Assessment Denuded 09/30/23 1400  Wound Length (cm) 30  cm 09/30/23 1400  Wound Width (cm) 8 cm (in sidelying, did not pull skin too much) 09/30/23 1400  Wound Depth (cm) 4 cm 09/30/23 1400  Wound Surface Area (cm^2) 240 cm^2 09/30/23 1400  Wound Volume (cm^3) 960 cm^3 09/30/23 1400  Undermining (cm) pockets of 1-2 cm undermining at 1 oclock and 7 oclock 09/30/23 1400  Margins Unattached edges (unapproximated) 09/30/23 1400  Drainage Amount Moderate 09/30/23 1400  Drainage Description Serosanguineous;Odor - foul 09/30/23 1400  Treatment Debridement (Selective);Hydrotherapy (Pulse lavage);Off loading;Packing (Saline gauze) 09/30/23 1400                                                                                            Hydrotherapy Pulsed lavage therapy - wound location: sacral area including perineal (avoiding rectum which appears still intact) Pulsed Lavage with Suction (psi): 12 psi Pulsed Lavage with Suction - Normal Saline  Used: 1000 mL Pulsed Lavage Tip: Tip with splash shield Selective Debridement (non-excisional) Selective Debridement (non-excisional) - Location: sacral area Selective Debridement (non-excisional) - Tools Used: Forceps, Scissors Selective Debridement (non-excisional) - Tissue Removed: black soft tissue in wound bed    Wound Assessment and Plan  Wound Therapy - Assess/Plan/Recommendations Wound Therapy - Clinical Statement: Pt admitted with extensive necrotic sacral, perineal, scrotal, and posterior thigh pressure wounds.  Wounds likely due to combination of presence of urines/feces and pressure injury.  Pt seen today, 09/30/23 with surgical PA and RN.  Plan is for hydrotherapy and dressing changes to sacral area.  Posterior thighs also have necrotic tissue however plan is to apply medihoney and dressing changes which occurred today during session due to extensive area of wound.  Pt would benefit from continued hydrotherapy to assist with removal of nonviable tissue and promote wound healing.   Air mattress ordered for pt.  Pt would also benefit from prevalon boots (RN notified).  Surgery plans to monitor wound for possible further OR debridement. Wound Therapy - Functional Problem List: decreased mobility, pain Factors Delaying/Impairing Wound Healing: Multiple medical problems, Immobility, Infection - systemic/local, Other (comment) (chemotherapy) Hydrotherapy Plan: Dressing change, Patient/family education, Pulsatile lavage with suction, Debridement Wound Therapy - Frequency: 2X / week Wound Therapy - Current Recommendations: Other (comment) (surgery, WOC, PT, OT all on board) Wound Therapy - Follow Up Recommendations: dressing changes by RN, f/u pulsed lavage with suction, f/u selective debridement (may need hydrotherapy upon d/c if no further surgical involvement)  Wound Therapy Goals- Improve the function of patient's integumentary system by progressing the wound(s) through the phases of wound healing (inflammation - proliferation - remodeling) by: Wound Therapy Goals - Improve the function of patient's integumentary system by progressing the wound(s) through the phases of wound healing by: Decrease Necrotic Tissue to: 30 Decrease Necrotic Tissue - Progress: Goal set today Increase Granulation Tissue to: 70 Increase Granulation Tissue - Progress: Goal set today Additional Wound Therapy Goal: Pt and family involved in pressure relief such as monitoring bony prominences and turning, positioning Additional Wound Therapy Goal - Progress: Goal set today Goals/treatment plan/discharge plan were made with and agreed upon by patient/family: Yes Time For Goal Achievement: 2 weeks Wound Therapy - Potential for Goals: Fair  Goals will be updated until maximal potential achieved or discharge criteria met.  Discharge criteria: when goals achieved, discharge from hospital, MD decision/surgical intervention, no progress towards goals, refusal/missing three consecutive treatments without  notification or medical reason.  GP     Charges PT Wound Care Charges $Wound Debridement up to 20 cm: < or equal to 20 cm $ Wound Debridement each add'l 20 sqcm: 1 $PT Hydrotherapy Dressing: 1 dressing $PT PLS Gun and Tip: 1 Supply $PT Hydrotherapy Visit: 1 Visit       Kati L Payson 09/30/2023, 4:01 PM [name], DPT Physical Therapist Acute Rehabilitation Services Office: [phone]

## 2023-09-30 NOTE — Progress Notes (Signed)
Still on pressors but feels well Doing well w/ clear liquids.  Cultures prelim results PSA, enterococcus and bacteroides from sacral wound also noted that the culture was betalactamase positive.  Sensitivities not back.  Zosyn should cover all of these organism however I worry about if the zosyn will fully cover the PSA and perhaps that's why clinically still on pressors and still has sepsis related thrombocytopenia  Plan Cont current rx for now.  If turns out that his PSA is zosyn resistant might be best approach would be change to meropenem or cefepime for the PSA and bacteroids and vanc for the enterococcus   Will cont watch and will pass on to team   Simonne Martinet ACNP-BC [facility] Pulmonary/Critical Care Pager # [phone] OR # [phone] if no answer

## 2023-09-30 NOTE — Plan of Care (Signed)
  Problem: Pain Managment: Goal: General experience of comfort will improve and/or be controlled Outcome: Not Progressing   Problem: Safety: Goal: Ability to remain free from injury will improve Outcome: Not Progressing   Problem: Activity: Goal: Ability to tolerate increased activity will improve Outcome: Not Progressing   Problem: Education: Goal: Knowledge of General Education information will improve Description: Including pain rating scale, medication(s)/side effects and non-pharmacologic comfort measures Outcome: Not Progressing   Problem: Health Behavior/Discharge Planning: Goal: Ability to manage health-related needs will improve Outcome: Not Progressing

## 2023-09-30 NOTE — Progress Notes (Signed)
Pharmacy Antibiotic Note  Derek [name] is a 78 y.o. male with limited mobility (wheelchair- bound), multiple myeloma, and sacral wound who presented to the ED on 09/26/2023 with c/o generalized weakness, worsening of LE edema and weeping leg wounds.  Chest CT on 2/11 showed findings with concern for PNA. He subsequently underwent I&D of sacral wound with loop colostomy on 09/28/23.  He's currently on zosyn for PNA and wound infection.  Today, 09/30/2023: - day #4 abx - afeb - scr has improved to 0.97 (crcl ~75)  Plan: - continue zosyn 3.375 gm IV q8h (infuse over 4 hrs) - With good renal function, pharmacy will sign off for abx consult.  Reconsult [place] if need further assistance.  ____________________________________________  Height: 5\' 6"  (167.6 cm) Weight: 108.8 kg (239 lb 13.8 oz) IBW/kg (Calculated) : 63.8  Temp (24hrs), Avg:97.7 F (36.5 C), Min:97.3 F (36.3 C), Max:98.9 F (37.2 C)  Recent Labs  Lab 09/26/23 1706 09/26/23 2232 09/26/23 2248 09/27/23 0024 09/27/23 0314 09/27/23 0515 09/27/23 1351 09/28/23 0908 09/29/23 0619 09/30/23 0414  WBC 8.5  --   --   --   --  6.4 4.2 3.7* 3.7*  --   CREATININE 4.43*   < >  --   --  3.56* 3.22*  --  1.81* 1.00 0.97  LATICACIDVEN  --   --  3.0* 2.0*  --   --   --   --   --   --    < > = values in this interval not displayed.    Estimated Creatinine Clearance: 75 mL/min (by C-G formula based on SCr of 0.97 mg/dL).    No Known Allergies  2/10 Ceftriaxone x1 2/11 Cefepime >> 2/11 2/11 Vancomycin >> 2/11 2/11 Zosyn >>    2/10 BCx x2:  2/10 MRSA PCR: not detected 2/11 MRSA PCR: neg  2/10 UCx: neg FINAL  2/12 sacral tissue wound: moderate GPR and moderate GNR, rare GPC in clusters   Thank you for allowing pharmacy to be a part of this patient's care.  Lucia Gaskins 09/30/2023 9:38 AM

## 2023-09-30 NOTE — Progress Notes (Signed)
Patient started on amiodarone for Afib RVR. Levo increased outside providers orders due to rapid decline in patient BP. Pt now stable LEVO@12  BP 106/50

## 2023-09-30 NOTE — Consult Note (Signed)
WOC Nurse ostomy consult note Stoma type/location:  LMQ diverting loop colostomy  Stomal assessment/size: 2" largely round, red moist, edematous, productive of liquid brown effluent  Peristomal assessment: intact  Treatment options for stomal/peristomal skin:  Output approximately 150 mls liquid brown effluent  Ostomy pouching: 2 piece flat 2 3/4" skin barrier [place] #2), 2 3/4" pouch [place] [phone]) and 2" barrier ring [place] [phone])  Education provided: Discussed with wife and daughter that pouch will need to be emptied when 1/3 to 1/2 full.  Demonstrated emptying pouch today and cleaning spout with toilet paper wick. Also reviewed burping of pouch as pouch full of gas when this RN arrived.  We removed current pouch to assess stoma.  Discussed that at this time patient has a red rubber stabilizing catheter in place which will be removed Monday 2/17. His stoma is edematous and we discussed that this will usually subside in the next 6-8 weeks.  Stoma is sized today at 2" largely round.  I demonstrated cleaning stoma with water moistened washcloth only. Discussed no baby wipes, lotions, soaps as these may leave a residue. I stretched a 2" barrier ring to fit around stoma.  I cut a new skin barrier at 2" and placed on top of barrier ring.  I snapped new pouch onto skin barrier. Daughter did practice snapping the skin barrier and pouch together and did this without difficulty.  Patient remains critically ill in ICU setting and was drifting in and out during teaching.   I reviewed educational materials with wife and daughter.  Discussed showering/bathing with pouch on or off.  Discussed emptying pouch directly into toilet at home. I answered families questions.  Encouraged them to read educational materials on colostomy and that our team will work with them again next week and remove the red rubber catheter at that time.  I am unsure if patient will be able to care for stoma moving forward.  If so, a one piece  flat flexible may be easier for him.  I placed (2) sets of 2 3/4" flat skin barrier, pouch and barrier ring in room as well as (2) flat flexible 1 piece pouches.  I did discuss pouching options with daughter and wife.    Enrolled patient in [employer] DC program: Yes  WOC team will continue to follow for ostomy education and support.   Thank you,    Priscella Mann MSN, RN-BC, [employer] [phone]

## 2023-10-01 DIAGNOSIS — R6521 Severe sepsis with septic shock: Secondary | ICD-10-CM | POA: Diagnosis not present

## 2023-10-01 DIAGNOSIS — A419 Sepsis, unspecified organism: Secondary | ICD-10-CM | POA: Diagnosis not present

## 2023-10-01 DIAGNOSIS — L8942 Pressure ulcer of contiguous site of back, buttock and hip, stage 2: Secondary | ICD-10-CM | POA: Diagnosis not present

## 2023-10-01 DIAGNOSIS — N179 Acute kidney failure, unspecified: Secondary | ICD-10-CM | POA: Diagnosis not present

## 2023-10-01 LAB — GLUCOSE, CAPILLARY
Glucose-Capillary: 111 mg/dL — ABNORMAL HIGH (ref 70–99)
Glucose-Capillary: 115 mg/dL — ABNORMAL HIGH (ref 70–99)
Glucose-Capillary: 120 mg/dL — ABNORMAL HIGH (ref 70–99)
Glucose-Capillary: 143 mg/dL — ABNORMAL HIGH (ref 70–99)
Glucose-Capillary: 147 mg/dL — ABNORMAL HIGH (ref 70–99)
Glucose-Capillary: 184 mg/dL — ABNORMAL HIGH (ref 70–99)
Glucose-Capillary: 187 mg/dL — ABNORMAL HIGH (ref 70–99)

## 2023-10-01 LAB — BPAM RBC
Blood Product Expiration Date: 202503102359
Blood Product Expiration Date: 202503112359
Blood Product Expiration Date: 202503112359
Blood Product Expiration Date: 202503112359
Blood Product Expiration Date: 202503112359
ISSUE DATE / TIME: 202502112009
ISSUE DATE / TIME: 202502121336
ISSUE DATE / TIME: 202502121359
ISSUE DATE / TIME: 202502121359
Unit Type and Rh: 6200
Unit Type and Rh: 6200
Unit Type and Rh: 6200
Unit Type and Rh: 6200
Unit Type and Rh: 6200

## 2023-10-01 LAB — CBC
HCT: 32.4 % — ABNORMAL LOW (ref 39.0–52.0)
Hemoglobin: 10.4 g/dL — ABNORMAL LOW (ref 13.0–17.0)
MCH: 31.1 pg (ref 26.0–34.0)
MCHC: 32.1 g/dL (ref 30.0–36.0)
MCV: 97 fL (ref 80.0–100.0)
Platelets: 42 10*3/uL — ABNORMAL LOW (ref 150–400)
RBC: 3.34 MIL/uL — ABNORMAL LOW (ref 4.22–5.81)
RDW: 16.9 % — ABNORMAL HIGH (ref 11.5–15.5)
WBC: 5 10*3/uL (ref 4.0–10.5)
nRBC: 0.4 % — ABNORMAL HIGH (ref 0.0–0.2)

## 2023-10-01 LAB — BASIC METABOLIC PANEL
Anion gap: 8 (ref 5–15)
BUN: 42 mg/dL — ABNORMAL HIGH (ref 8–23)
CO2: 21 mmol/L — ABNORMAL LOW (ref 22–32)
Calcium: 8.2 mg/dL — ABNORMAL LOW (ref 8.9–10.3)
Chloride: 103 mmol/L (ref 98–111)
Creatinine, Ser: 1.11 mg/dL (ref 0.61–1.24)
GFR, Estimated: >60 mL/min (ref >60)
Glucose, Bld: 210 mg/dL — ABNORMAL HIGH (ref 70–99)
Potassium: 3.5 mmol/L (ref 3.5–5.1)
Sodium: 132 mmol/L — ABNORMAL LOW (ref 135–145)

## 2023-10-01 LAB — TYPE AND SCREEN
ABO/RH(D): A POS
Antibody Screen: POSITIVE
Unit division: 0
Unit division: 0
Unit division: 0
Unit division: 0
Unit division: 0

## 2023-10-01 LAB — HEPARIN LEVEL (UNFRACTIONATED): Heparin Unfractionated: <0.1 [IU]/mL — ABNORMAL LOW (ref 0.30–0.70)

## 2023-10-01 LAB — PHOSPHORUS: Phosphorus: 2.2 mg/dL — ABNORMAL LOW (ref 2.5–4.6)

## 2023-10-01 LAB — MAGNESIUM: Magnesium: 1.6 mg/dL — ABNORMAL LOW (ref 1.7–2.4)

## 2023-10-01 MED ORDER — HEPARIN (PORCINE) 25000 UT/250ML-% IV SOLN
2300.0000 [IU]/h | INTRAVENOUS | Status: DC
  Administered 2023-10-01: 1200 [IU]/h via INTRAVENOUS
  Administered 2023-10-02: 1700 [IU]/h via INTRAVENOUS
  Administered 2023-10-03: 2000 [IU]/h via INTRAVENOUS
  Filled 2023-10-01 (×3): qty 250

## 2023-10-01 MED ORDER — MAGIC MOUTHWASH W/LIDOCAINE
5.0000 mL | Freq: Four times a day (QID) | ORAL | Status: DC | PRN
  Administered 2023-10-01 – 2023-10-05 (×2): 5 mL via ORAL
  Filled 2023-10-01 (×4): qty 5

## 2023-10-01 MED ORDER — LACTATED RINGERS IV BOLUS
500.0000 mL | Freq: Once | INTRAVENOUS | Status: AC
  Administered 2023-10-01: 500 mL via INTRAVENOUS

## 2023-10-01 MED ORDER — POTASSIUM & SODIUM PHOSPHATES 280-160-250 MG PO PACK
2.0000 | PACK | ORAL | Status: AC
  Administered 2023-10-01 (×2): 2 via ORAL
  Filled 2023-10-01 (×4): qty 2

## 2023-10-01 MED ORDER — MAGNESIUM SULFATE 4 GM/100ML IV SOLN
4.0000 g | Freq: Once | INTRAVENOUS | Status: AC
  Administered 2023-10-01: 4 g via INTRAVENOUS
  Filled 2023-10-01: qty 100

## 2023-10-01 MED ORDER — INSULIN ASPART 100 UNIT/ML IJ SOLN
1.0000 [IU] | Freq: Three times a day (TID) | INTRAMUSCULAR | Status: DC
  Administered 2023-10-01: 1 [IU] via SUBCUTANEOUS
  Administered 2023-10-02 (×2): 2 [IU] via SUBCUTANEOUS
  Administered 2023-10-02 – 2023-10-03 (×3): 1 [IU] via SUBCUTANEOUS
  Administered 2023-10-03 – 2023-10-05 (×9): 2 [IU] via SUBCUTANEOUS
  Administered 2023-10-06: 1 [IU] via SUBCUTANEOUS
  Administered 2023-10-06: 2 [IU] via SUBCUTANEOUS
  Administered 2023-10-06: 3 [IU] via SUBCUTANEOUS
  Administered 2023-10-07: 2 [IU] via SUBCUTANEOUS
  Administered 2023-10-07: 1 [IU] via SUBCUTANEOUS
  Administered 2023-10-08: 2 [IU] via SUBCUTANEOUS
  Administered 2023-10-08: 1 [IU] via SUBCUTANEOUS
  Administered 2023-10-08 – 2023-10-09 (×3): 2 [IU] via SUBCUTANEOUS
  Administered 2023-10-09: 1 [IU] via SUBCUTANEOUS
  Administered 2023-10-10: 2 [IU] via SUBCUTANEOUS
  Administered 2023-10-10: 1 [IU] via SUBCUTANEOUS
  Administered 2023-10-10: 2 [IU] via SUBCUTANEOUS
  Administered 2023-10-12: 1 [IU] via SUBCUTANEOUS
  Administered 2023-10-12: 2 [IU] via SUBCUTANEOUS
  Administered 2023-10-12: 1 [IU] via SUBCUTANEOUS
  Administered 2023-10-13: 2 [IU] via SUBCUTANEOUS
  Administered 2023-10-13 (×2): 1 [IU] via SUBCUTANEOUS
  Administered 2023-10-15: 3 [IU] via SUBCUTANEOUS
  Administered 2023-10-15 – 2023-10-16 (×3): 1 [IU] via SUBCUTANEOUS
  Administered 2023-10-16: 2 [IU] via SUBCUTANEOUS
  Administered 2023-10-16 – 2023-10-17 (×3): 1 [IU] via SUBCUTANEOUS
  Administered 2023-10-17: 3 [IU] via SUBCUTANEOUS

## 2023-10-01 NOTE — Progress Notes (Signed)
 PHARMACY - ANTICOAGULATION CONSULT NOTE  Pharmacy Consult for heparin  Indication: acute DVT  No Known Allergies  Patient Measurements: Height: 5\' 6"  (167.6 cm) Weight: 117.9 kg (259 lb 14.8 oz) IBW/kg (Calculated) : 63.8 Heparin Dosing Weight: 88 kg  Vital Signs: Temp: 98.2 F (36.8 C) (02/15 1006) Temp Source: Oral (02/15 1006) Pulse Rate: 63 (02/15 1145)  Labs: Recent Labs    09/29/23 0619 09/30/23 0414 09/30/23 0840 10/01/23 0352  HGB 11.1*  --  10.6* 10.4*  HCT 33.3*  --  32.2* 32.4*  PLT 96*  --  42* 42*  CREATININE 1.00 0.97  --  1.11    Estimated Creatinine Clearance: 68.4 mL/min (by C-G formula based on SCr of 1.11 mg/dL).   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 male with limited mobility (wheelchair- bound), multiple myeloma, and sacral wound who presented to the ED on 09/26/2023 with c/o generalized weakness, worsening of LE edema and weeping leg wounds.  LE doppler on 09/27/23 showed acute deep vein thrombosis involving the left  femoral vein. Anticoag. held off at the time due to low blood count. Pharmacy has been consulted on 10/01/23 to start heparin drip.  Per Dr. Wynona Neat via [place] msg on 2/15, no bolus for heparin drip and aim for lower heparin level goal range.  Significant events:  - 2/11: 1 unit PRBC - 2/12: 2 unit PRBC - 2/12:  I&D of sacral wound with loop colostomy.   Today, 10/01/2023: - hgb stable at 10.4 - plts low but stable at 42K - no bleeding documented  Goal of Therapy:  Heparin level 0.3-0.5 units/ml Monitor platelets by anticoagulation protocol: Yes   Plan:  - heparin drip at 1200 units/hr - check 8 hr heparin level - monitor for s/sx bleeding and plts closely   Johneisha Broaden P 10/01/2023,12:17 PM

## 2023-10-01 NOTE — Progress Notes (Signed)
 PHARMACY - ANTICOAGULATION CONSULT NOTE  Pharmacy Consult for heparin  Indication: acute DVT  No Known Allergies  Patient Measurements: Height: 5\' 6"  (167.6 cm) Weight: 117.9 kg (259 lb 14.8 oz) IBW/kg (Calculated) : 63.8 Heparin Dosing Weight: 88.5 kg  Vital Signs: Temp: 97.6 F (36.4 C) (02/15 1915) Temp Source: Oral (02/15 1915) Pulse Rate: 71 (02/15 1830)  Labs: Recent Labs    09/29/23 0619 09/30/23 0414 09/30/23 0840 10/01/23 0352 10/01/23 2033  HGB 11.1*  --  10.6* 10.4*  --   HCT 33.3*  --  32.2* 32.4*  --   PLT 96*  --  42* 42*  --   HEPARINUNFRC  --   --   --   --  <0.10*  CREATININE 1.00 0.97  --  1.11  --     Estimated Creatinine Clearance: 68.4 mL/min (by C-G formula based on SCr of 1.11 mg/dL).   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 male with limited mobility (wheelchair- bound), multiple myeloma, and sacral wound who presented to the ED on 09/26/2023 with c/o generalized weakness, worsening of LE edema and weeping leg wounds.  LE doppler on 09/27/23 showed acute deep vein thrombosis involving the left  femoral vein. Anticoag. held off at the time due to low blood count. Pharmacy has been consulted on 10/01/23 to start heparin drip.  Per Dr. Wynona Neat via [place] msg on 2/15, no bolus for heparin drip and aim for lower heparin level goal range.  Significant events:  - 2/11: 1 unit PRBC - 2/12: 2 unit PRBC - 2/12:  I&D of sacral wound with loop colostomy.   Today, 10/01/2023: - hgb stable at 10.4 - plts low but stable at 42K - no bleeding documented - first heparin level is undetectable 8 hours after drip started at 1200 units/hr - no bleeding per RN report - no IV issues per RN report  Goal of Therapy:  Heparin level 0.3-0.5  units/ml Monitor platelets by anticoagulation protocol: Yes   Plan:  - increase heparin drip to 1400 units/hr - check heparin level 8 hrs after rate increase - monitor for s/sx bleeding and plts closely  - daily CBC & heparin level   Herby Abraham, Pharm.D Use secure chat for questions 10/01/2023 9:27 PM

## 2023-10-01 NOTE — Progress Notes (Signed)
 NAME:  Derek [name], MRN:  [id number], DOB:  1946/02/04, LOS: 5 ADMISSION DATE:  09/26/2023, CONSULTATION DATE: 09/27/2023 REFERRING MD: Loetta Rough, MD, CHIEF COMPLAINT: hypotension   History of Present Illness:  A 78 y.o. male with multiple myeloma on chemotherapy, dyslipidemia, and GERD.  Brought in by family for increasing lower extremity edema, perineal weeping sores, and worsening generalized weakness for 1-2 weeks.  At baseline patient relatively independent.  He is experience rapid decline in function recently.  Per patient report because he is difficult to move he has been left in his urine and feces.  Per wife the patient experiences pain and does not want to be moved. Smoker. In ED, he was found to be hypotensive and given 2.5 L LR, Vanco, Rocephin, Cefepime, Levophed @ 7 mcg/min now, Rx for hyperK, and HTS for hypoNa.    Pertinent  Medical History  Multiple myeloma on chemotherapy, dyslipidemia, GERD  Significant Hospital Events: Including procedures, antibiotic start and stop dates in addition to other pertinent events   2/11 admit, CCS consult 2/12 Surgical Procedure:  LAPAROSCOPIC LOOP COLOSTOMY  EXCISIONAL SHARP DEBRIDEMENT OF 24 CM TALL X 16 CM WIDE X 4 CM DEEP SACRAL WOUND USING A SCALPEL AND ELECTROCAUTERY \ Left on vent for Afib/shock state postop 2/13 extubated  2/15 on amiodarone, on Levophed  Interim History / Subjective:  Awake alert oriented x 3 Denies any significant complaints  Objective   Blood pressure (!) 178/153, pulse 63, temperature 98.2 F (36.8 C), temperature source Oral, resp. rate 15, height 5\' 6"  (1.676 m), weight 117.9 kg, SpO2 98%.        Intake/Output Summary (Last 24 hours) at 10/01/2023 1153 Last data filed at 10/01/2023 1100 Gross per 24 hour  Intake 2039.73 ml  Output 1550 ml  Net 489.73 ml   Filed Weights   09/28/23 0500 09/29/23 0500 10/01/23 0500  Weight: 108.8 kg 108.8 kg 117.9 kg    Examination: Elderly, chronically  ill-appearing Moist oral mucosa Decreased air movement bilaterally S1-S2 appreciated Bowel sounds appreciated, ostomy is pink, tolerating orally Awake alert oriented, following commands  Resolved Hospital Problem list     Assessment & Plan:   Septic shock secondary to large necrotic sacral ulcer s/p debridement Diverting ostomy 09/28/2023 -Still on pressors -Day 5 of Zosyn -Continue wound care -Continue hydrotherapy -On a.m. address -Advance diet per surgical guidance  Atrial fibrillation -Rate control with amiodarone  Acute kidney injury -Is stable -Maintain renal perfusion Avoid nephrotoxic medications  Anemia/thrombocytopenia Femoral DVT -His platelets are stable from yesterday -Will initiate anticoagulation without bolus  Wheelchair-bound Failure to thrive History of multiple myeloma on chemotherapy -Palliative care continues to follow Continue supportive measures  Class III obesity -Supportive measures  Best Practice (right click and "Reselect all SmartList Selections" daily)   Diet/type: per CCS DVT prophylaxis: off pending CBC stability w/ plan to start IV heparin Pressure ulcer(s): present on admission  GI prophylaxis: oral ppi  Lines: N/A Foley:  Yes, and it is still needed Code Status:  full code Last date of multidisciplinary goals of care discussion [PMT 09/27/23]  The patient is critically ill with multiple organ systems failure and requires high complexity decision making for assessment and support, frequent evaluation and titration of therapies, application of advanced monitoring technologies and extensive interpretation of multiple databases. Critical Care Time devoted to patient care services described in this note independent of APP/resident time (if applicable)  is 31 minutes.   Virl Diamond MD [place] [facility]  Personal pager: See Loretha Stapler If unanswered, please page CCM On-call: #[phone]

## 2023-10-01 NOTE — Progress Notes (Signed)
 Pt converted to sinus brady during dressing change. EKG placed in chart. Dr. Wynona Neat made aware. Per MD RN to leave Amio running as long as HR >50

## 2023-10-01 NOTE — Progress Notes (Signed)
 [facility] ADULT ICU REPLACEMENT PROTOCOL   The patient does apply for the [facility] Adult ICU Electrolyte Replacment Protocol based on the criteria listed below:   1.Exclusion criteria: TCTS, ECMO, Dialysis, and Myasthenia Gravis patients 2. Is GFR >/= 30 ml/min? Yes.    Patient's GFR today is >60 3. Is SCr </= 2? Yes.   Patient's SCr is 1.11 mg/dL 4. Did SCr increase >/= 0.5 in 24 hours? No. 5.Pt's weight >40kg  Yes.   6. Abnormal electrolyte(s): Phos 2.2, K+ 3.5, Mag 1.6  7. Electrolytes replaced per protocol 8.  Call MD STAT for K+ </= 2.5, Phos </= 1, or Mag </= 1 Physician:  Larinda Buttery [facility] 10/01/2023 5:37 AM

## 2023-10-02 DIAGNOSIS — R6521 Severe sepsis with septic shock: Secondary | ICD-10-CM | POA: Diagnosis not present

## 2023-10-02 DIAGNOSIS — R4589 Other symptoms and signs involving emotional state: Secondary | ICD-10-CM | POA: Diagnosis not present

## 2023-10-02 DIAGNOSIS — A419 Sepsis, unspecified organism: Secondary | ICD-10-CM | POA: Diagnosis not present

## 2023-10-02 DIAGNOSIS — L8942 Pressure ulcer of contiguous site of back, buttock and hip, stage 2: Secondary | ICD-10-CM | POA: Diagnosis not present

## 2023-10-02 DIAGNOSIS — Z515 Encounter for palliative care: Secondary | ICD-10-CM | POA: Diagnosis not present

## 2023-10-02 DIAGNOSIS — N179 Acute kidney failure, unspecified: Secondary | ICD-10-CM | POA: Diagnosis not present

## 2023-10-02 DIAGNOSIS — L89222 Pressure ulcer of left hip, stage 2: Secondary | ICD-10-CM | POA: Diagnosis not present

## 2023-10-02 LAB — CULTURE, BLOOD (ROUTINE X 2)
Culture: NO GROWTH
Culture: NO GROWTH

## 2023-10-02 LAB — AEROBIC/ANAEROBIC CULTURE W GRAM STAIN (SURGICAL/DEEP WOUND): Gram Stain: NONE SEEN

## 2023-10-02 LAB — CBC
HCT: 29.8 % — ABNORMAL LOW (ref 39.0–52.0)
Hemoglobin: 9.8 g/dL — ABNORMAL LOW (ref 13.0–17.0)
MCH: 30.9 pg (ref 26.0–34.0)
MCHC: 32.9 g/dL (ref 30.0–36.0)
MCV: 94 fL (ref 80.0–100.0)
Platelets: 50 10*3/uL — ABNORMAL LOW (ref 150–400)
RBC: 3.17 MIL/uL — ABNORMAL LOW (ref 4.22–5.81)
RDW: 16.7 % — ABNORMAL HIGH (ref 11.5–15.5)
WBC: 4 10*3/uL (ref 4.0–10.5)
nRBC: 0.5 % — ABNORMAL HIGH (ref 0.0–0.2)

## 2023-10-02 LAB — BASIC METABOLIC PANEL
Anion gap: 7 (ref 5–15)
BUN: 34 mg/dL — ABNORMAL HIGH (ref 8–23)
CO2: 23 mmol/L (ref 22–32)
Calcium: 7.9 mg/dL — ABNORMAL LOW (ref 8.9–10.3)
Chloride: 103 mmol/L (ref 98–111)
Creatinine, Ser: 1.14 mg/dL (ref 0.61–1.24)
GFR, Estimated: >60 mL/min (ref >60)
Glucose, Bld: 136 mg/dL — ABNORMAL HIGH (ref 70–99)
Potassium: 3.7 mmol/L (ref 3.5–5.1)
Sodium: 133 mmol/L — ABNORMAL LOW (ref 135–145)

## 2023-10-02 LAB — GLUCOSE, CAPILLARY
Glucose-Capillary: 123 mg/dL — ABNORMAL HIGH (ref 70–99)
Glucose-Capillary: 173 mg/dL — ABNORMAL HIGH (ref 70–99)
Glucose-Capillary: 180 mg/dL — ABNORMAL HIGH (ref 70–99)
Glucose-Capillary: 112 mg/dL — ABNORMAL HIGH (ref 70–99)

## 2023-10-02 LAB — PHOSPHORUS: Phosphorus: 2.7 mg/dL (ref 2.5–4.6)

## 2023-10-02 LAB — HEPARIN LEVEL (UNFRACTIONATED)
Heparin Unfractionated: <0.1 [IU]/mL — ABNORMAL LOW (ref 0.30–0.70)
Heparin Unfractionated: <0.1 [IU]/mL — ABNORMAL LOW (ref 0.30–0.70)

## 2023-10-02 LAB — MAGNESIUM: Magnesium: 1.8 mg/dL (ref 1.7–2.4)

## 2023-10-02 NOTE — Progress Notes (Addendum)
  Daily Progress Note   Patient Name: Derek [name]       Date: 10/02/2023 DOB: August 08, 1946  Age: 78 y.o. MRN#: [id number] Attending Physician: Tomma Lightning, MD Primary Care Physician: [facility] Admit Date: 09/26/2023 Length of Stay: 6 days  Reason for Consultation/Follow-up: Establishing goals of care  Subjective:   CC: Patient laying in bed noting that he is feeling all right at this time.  Following up regarding complex medical decision making.   Subjective:   Reviewed EMR for updates.  Patient continuing to receive wound care.  Patient receiving as needed fentanyl for pain management.  Patient remains on amiodarone and Levophed. Discussed care with bedside RN for updates.  Presented to bedside to see patient.  No family present at bedside.  Patient initially sleeping though easily awakened.  Patient notes that he is feeling all right at this time.  Notes he got pain medicine and so his pain has greatly improved.  Patient does not have any concerns noted at this time.  Continuing management as per PCCM and surgical teams.  Objective:   Vital Signs:  BP (!) 178/153   Pulse 80   Temp 98.4 F (36.9 C) (Axillary)   Resp 17   Ht 5\' 6"  (1.676 m)   Wt 116.1 kg   SpO2 97%   BMI 41.32 kg/m   Physical Exam: General: NAD, chronically ill-appearing, pleasant while awake Cardiovascular: RRR Respiratory: no increased work of breathing noted, not in respiratory distress Skin: Multiple wounds noted in EMR review  Imaging: I personally reviewed recent imaging.   Assessment & Plan:   Assessment: Patient is a 78 year old male with a past medical history of multiple myeloma on chemotherapy, dyslipidemia, and GERD who was admitted on 09/27/2023 for management of increasing lower extremity edema, peritoneal weeping sores, and worsening generalized weakness for the past 1 to 2 weeks. Upon admission patient found to be in septic shock due to severe dehydration and possible  sepsis, AKI, lactic acidosis, and to have multiple electrolyte abnormalities. Palliative medicine team consulted to assist with complex medical decision making.   Recommendations/Plan: # Complex medical decision making/goals of care:  - Patient and family have expressed desire to continue medical interventions with hope for patient's overall improvement.  Palliative medicine team has previously engaged the patient and family regarding concerns about patient's poor functional status, known multiple myeloma, and lack of nutrition prior to admission which would likely complicate patient's ability to overcome this medical illness with multiple comorbidities. Palliative medicine team will continue to engage in conversations as appropriate.  -  Code Status: Full Code  # Psychosocial Support:  - wife, daughter   # Discharge Planning: To Be Determined  Discussed with: patient, RN  Thank you for allowing the palliative care team to participate in the care [facility].  Alvester Morin, DO Palliative Care Provider PMT # [phone]  If patient remains symptomatic despite maximum doses, please call PMT at [phone] between 0700 and 1900. Outside of these hours, please call attending, as PMT does not have night coverage.  Personally spent 25 minutes in patient care including extensive chart review (labs, imaging, progress/consult notes, vital signs), medically appropraite exam, discussed with treatment team, education to patient, family, and staff, documenting clinical information, medication review and management, coordination of care, and available advanced directive documents.

## 2023-10-02 NOTE — Progress Notes (Signed)
 PHARMACY - ANTICOAGULATION CONSULT NOTE  Pharmacy Consult for heparin  Indication: acute DVT  No Known Allergies  Patient Measurements: Height: 5\' 6"  (167.6 cm) Weight: 116.1 kg (256 lb) IBW/kg (Calculated) : 63.8 Heparin Dosing Weight: 88 kg  Vital Signs: Temp: 98.4 F (36.9 C) (02/16 1232) Temp Source: Axillary (02/16 1232) Pulse Rate: 81 (02/16 1600)  Labs: Recent Labs    09/30/23 0414 09/30/23 0840 09/30/23 0840 10/01/23 0352 10/01/23 2033 10/02/23 0556 10/02/23 1448  HGB  --  10.6*   < > 10.4*  --  9.8*  --   HCT  --  32.2*  --  32.4*  --  29.8*  --   PLT  --  42*  --  42*  --  50*  --   HEPARINUNFRC  --   --   --   --  <0.10* <0.10* <0.10*  CREATININE 0.97  --   --  1.11  --  1.14  --    < > = values in this interval not displayed.    Estimated Creatinine Clearance: 66 mL/min (by C-G formula based on SCr of 1.14 mg/dL).   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 male with limited mobility (wheelchair- bound), multiple myeloma, and sacral wound who presented to the ED on 09/26/2023 with c/o generalized weakness, worsening of LE edema and weeping leg wounds.  LE doppler on 09/27/23 showed acute deep vein thrombosis involving the left  femoral vein. Anticoag. held off at the time due to low blood count. Pharmacy has been consulted on 10/01/23 to start heparin drip.  Per Dr. Wynona Neat via [place] msg on 2/15, no bolus for heparin drip and aim for lower heparin level goal range.  Significant events:  - 2/11: 1 unit PRBC - 2/12: 2 unit PRBC - 2/12:  I&D of sacral wound with loop colostomy.   Today, 10/02/2023: -Heparin level (~ 7 hour level) remains undetectable with increase in heparin infusion -Hgb down slightly to 9.8, plts up 50K -No complications  of therapy noted, no bleeding during wound care; IV site appears to be infusing well    Goal of Therapy:  Heparin level 0.3-0.5 units/ml Monitor platelets by anticoagulation protocol: Yes   Plan:  -Increase heparin infusion to 2000 units/hr -Discussed with RN, will move heparin infusion to right IJ CVC -Check 8 hr heparin level -Monitor for s/sx bleeding, follow plts closely    Pricilla Riffle, PharmD, BCPS Clinical Pharmacist 10/02/2023 4:18 PM

## 2023-10-02 NOTE — Progress Notes (Signed)
 NAME:  Derek [name], MRN:  [id number], DOB:  08/15/1946, LOS: 6 ADMISSION DATE:  09/26/2023, CONSULTATION DATE: 09/27/2023 REFERRING MD: Loetta Rough, MD, CHIEF COMPLAINT: hypotension   History of Present Illness:  A 78 y.o. male with multiple myeloma on chemotherapy, dyslipidemia, and GERD.  Brought in by family for increasing lower extremity edema, perineal weeping sores, and worsening generalized weakness for 1-2 weeks.  At baseline patient relatively independent.  He is experience rapid decline in function recently.  Per patient report because he is difficult to move he has been left in his urine and feces.  Per wife the patient experiences pain and does not want to be moved. Smoker. In ED, he was found to be hypotensive and given 2.5 L LR, Vanco, Rocephin, Cefepime, Levophed @ 7 mcg/min now, Rx for hyperK, and HTS for hypoNa.    Pertinent  Medical History  Multiple myeloma on chemotherapy, dyslipidemia, GERD  Significant Hospital Events: Including procedures, antibiotic start and stop dates in addition to other pertinent events   2/11 admit, CCS consult 2/12 Surgical Procedure:  LAPAROSCOPIC LOOP COLOSTOMY  EXCISIONAL SHARP DEBRIDEMENT OF 24 CM TALL X 16 CM WIDE X 4 CM DEEP SACRAL WOUND USING A SCALPEL AND ELECTROCAUTERY \ Left on vent for Afib/shock state postop 2/13 extubated  2/15 on amiodarone, on Levophed  Interim History / Subjective:  Awake and alert, oriented x 3 Denies significant complaints Still complaining of significant pain in his mouth  Objective   Blood pressure (!) 178/153, pulse 70, temperature 98.4 F (36.9 C), temperature source Axillary, resp. rate 16, height 5\' 6"  (1.676 m), weight 116.1 kg, SpO2 98%.        Intake/Output Summary (Last 24 hours) at 10/02/2023 1127 Last data filed at 10/02/2023 1017 Gross per 24 hour  Intake 1720.45 ml  Output 1975 ml  Net -254.55 ml   Filed Weights   09/29/23 0500 10/01/23 0500 10/02/23 0500  Weight: 108.8 kg  117.9 kg 116.1 kg    Examination: Elderly, chronically ill-appearing Moist oral mucosa Decreased air movement bilaterally S1-S2 appreciated Bowel sounds appreciated ostomy noted Awake alert and oriented, following commands very frail  I reviewed nursing notes,  last 24 h vitals and pain scores, last 48 h intake and output, last 24 h labs and trends, and last 24 h imaging results.  Resolved Hospital Problem list     Assessment & Plan:   Septic shock secondary to large necrotic sacral ulcer s/p debridement Diverting ostomy 09/28/2023 -Remains on pressors -Day 6 of Zosyn -Continues on wound care with hydrotherapy -On clear liquid diet  Atrial fibrillation -Rate control with amiodarone  Acute kidney injury -Maintain renal perfusion -Avoid nephrotoxic medications  Anemia, thrombocytopenia Femoral DVT -Platelets stable -Started on anticoagulation -Monitor his H&H  Wheelchair-bound at baseline Failure to thrive History of multiple myeloma on chemotherapy -Appreciate palliative care follow-up   Best Practice (right click and "Reselect all SmartList Selections" daily)   Diet/type: per CCS-clear liquid diet while on pressors DVT prophylaxis: off pending CBC stability w/ plan to start IV heparin Pressure ulcer(s): present on admission  GI prophylaxis: oral ppi  Lines: N/A Foley:  Yes, and it is still needed Code Status:  full code Last date of multidisciplinary goals of care discussion [palliative care medicine continues to follow]  The patient is critically ill with multiple organ systems failure and requires high complexity decision making for assessment and support, frequent evaluation and titration of therapies, application of advanced monitoring technologies and extensive interpretation  of multiple databases. Critical Care Time devoted to patient care services described in this note independent of APP/resident time (if applicable)  is 33 minutes.   Virl Diamond  MD [place] [facility] Personal pager: See [place] If unanswered, please page CCM On-call: [phone]

## 2023-10-02 NOTE — Progress Notes (Signed)
 PHARMACY - ANTICOAGULATION CONSULT NOTE  Pharmacy Consult for heparin  Indication: acute DVT  No Known Allergies  Patient Measurements: Height: 5\' 6"  (167.6 cm) Weight: 116.1 kg (256 lb) IBW/kg (Calculated) : 63.8 Heparin Dosing Weight: 88 kg  Vital Signs: Temp: 98.9 F (37.2 C) (02/15 2315) Temp Source: Oral (02/15 2315) Pulse Rate: 73 (02/16 0100)  Labs: Recent Labs    09/30/23 0414 09/30/23 0840 09/30/23 0840 10/01/23 0352 10/01/23 2033 10/02/23 0556  HGB  --  10.6*   < > 10.4*  --  9.8*  HCT  --  32.2*  --  32.4*  --  29.8*  PLT  --  42*  --  42*  --  50*  HEPARINUNFRC  --   --   --   --  <0.10* <0.10*  CREATININE 0.97  --   --  1.11  --  1.14   < > = values in this interval not displayed.    Estimated Creatinine Clearance: 66 mL/min (by C-G formula based on SCr of 1.14 mg/dL).   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 male with limited mobility (wheelchair- bound), multiple myeloma, and sacral wound who presented to the ED on 09/26/2023 with c/o generalized weakness, worsening of LE edema and weeping leg wounds.  LE doppler on 09/27/23 showed acute deep vein thrombosis involving the left  femoral vein. Anticoag. held off at the time due to low blood count. Pharmacy has been consulted on 10/01/23 to start heparin drip.  Per Dr. Wynona Neat via [place] msg on 2/15, no bolus for heparin drip and aim for lower heparin level goal range.  Significant events:  - 2/11: 1 unit PRBC - 2/12: 2 unit PRBC - 2/12:  I&D of sacral wound with loop colostomy.   Today, 10/02/2023: - heparin level remains undetectable. Per pt's RN, no issue with IV line and no bleeding noted. - hgb down slightly to 9.8, plts up 50K    Goal of Therapy:  Heparin level 0.3-0.5  units/ml Monitor platelets by anticoagulation protocol: Yes   Plan:  - Increase heparin drip to 1700 units/hr - check 8 hr heparin level - monitor for s/sx bleeding and plts closely   Dorna Leitz P 10/02/2023,7:04 AM

## 2023-10-03 ENCOUNTER — Inpatient Hospital Stay (HOSPITAL_COMMUNITY): Payer: Non-veteran care | Admitting: Anesthesiology

## 2023-10-03 ENCOUNTER — Encounter (HOSPITAL_COMMUNITY)
Admission: EM | Disposition: A | Discharge status: Nursing Home (Includes ICF) | Payer: Self-pay | Source: Home / Self Care | Attending: Family Medicine

## 2023-10-03 ENCOUNTER — Inpatient Hospital Stay (HOSPITAL_COMMUNITY): Payer: No Typology Code available for payment source

## 2023-10-03 ENCOUNTER — Other Ambulatory Visit: Payer: Self-pay

## 2023-10-03 DIAGNOSIS — N493 Fournier gangrene: Secondary | ICD-10-CM | POA: Diagnosis not present

## 2023-10-03 DIAGNOSIS — R6521 Severe sepsis with septic shock: Secondary | ICD-10-CM | POA: Diagnosis not present

## 2023-10-03 DIAGNOSIS — I4891 Unspecified atrial fibrillation: Secondary | ICD-10-CM | POA: Diagnosis not present

## 2023-10-03 DIAGNOSIS — M726 Necrotizing fasciitis: Secondary | ICD-10-CM

## 2023-10-03 DIAGNOSIS — A419 Sepsis, unspecified organism: Secondary | ICD-10-CM | POA: Diagnosis not present

## 2023-10-03 DIAGNOSIS — L8942 Pressure ulcer of contiguous site of back, buttock and hip, stage 2: Secondary | ICD-10-CM | POA: Diagnosis not present

## 2023-10-03 HISTORY — PX: IRRIGATION AND DEBRIDEMENT BUTTOCKS: SHX6601

## 2023-10-03 LAB — GLUCOSE, CAPILLARY
Glucose-Capillary: 133 mg/dL — ABNORMAL HIGH (ref 70–99)
Glucose-Capillary: 127 mg/dL — ABNORMAL HIGH (ref 70–99)
Glucose-Capillary: 123 mg/dL — ABNORMAL HIGH (ref 70–99)
Glucose-Capillary: 153 mg/dL — ABNORMAL HIGH (ref 70–99)

## 2023-10-03 LAB — BASIC METABOLIC PANEL
Anion gap: 11 (ref 5–15)
Anion gap: 8 (ref 5–15)
BUN: 27 mg/dL — ABNORMAL HIGH (ref 8–23)
BUN: 25 mg/dL — ABNORMAL HIGH (ref 8–23)
CO2: 19 mmol/L — ABNORMAL LOW (ref 22–32)
CO2: 22 mmol/L (ref 22–32)
Calcium: 7.7 mg/dL — ABNORMAL LOW (ref 8.9–10.3)
Calcium: 7.6 mg/dL — ABNORMAL LOW (ref 8.9–10.3)
Chloride: 102 mmol/L (ref 98–111)
Chloride: 104 mmol/L (ref 98–111)
Creatinine, Ser: 1.14 mg/dL (ref 0.61–1.24)
Creatinine, Ser: 1.07 mg/dL (ref 0.61–1.24)
GFR, Estimated: >60 mL/min (ref >60)
GFR, Estimated: >60 mL/min (ref >60)
Glucose, Bld: 130 mg/dL — ABNORMAL HIGH (ref 70–99)
Glucose, Bld: 124 mg/dL — ABNORMAL HIGH (ref 70–99)
Potassium: 3.2 mmol/L — ABNORMAL LOW (ref 3.5–5.1)
Potassium: 3 mmol/L — ABNORMAL LOW (ref 3.5–5.1)
Sodium: 132 mmol/L — ABNORMAL LOW (ref 135–145)
Sodium: 134 mmol/L — ABNORMAL LOW (ref 135–145)

## 2023-10-03 LAB — CBC
HCT: 29 % — ABNORMAL LOW (ref 39.0–52.0)
Hemoglobin: 9.3 g/dL — ABNORMAL LOW (ref 13.0–17.0)
MCH: 30.6 pg (ref 26.0–34.0)
MCHC: 32.1 g/dL (ref 30.0–36.0)
MCV: 95.4 fL (ref 80.0–100.0)
Platelets: 81 10*3/uL — ABNORMAL LOW (ref 150–400)
RBC: 3.04 MIL/uL — ABNORMAL LOW (ref 4.22–5.81)
RDW: 16.6 % — ABNORMAL HIGH (ref 11.5–15.5)
WBC: 4.1 10*3/uL (ref 4.0–10.5)
nRBC: 0 % (ref 0.0–0.2)

## 2023-10-03 LAB — MAGNESIUM: Magnesium: 1.5 mg/dL — ABNORMAL LOW (ref 1.7–2.4)

## 2023-10-03 LAB — HEPARIN LEVEL (UNFRACTIONATED)
Heparin Unfractionated: <0.1 [IU]/mL — ABNORMAL LOW (ref 0.30–0.70)
Heparin Unfractionated: <0.1 [IU]/mL — ABNORMAL LOW (ref 0.30–0.70)

## 2023-10-03 LAB — PHOSPHORUS: Phosphorus: 2.2 mg/dL — ABNORMAL LOW (ref 2.5–4.6)

## 2023-10-03 SURGERY — IRRIGATION AND DEBRIDEMENT BUTTOCKS
Anesthesia: General | Laterality: Bilateral

## 2023-10-03 MED ORDER — LIDOCAINE HCL (PF) 2 % IJ SOLN
INTRAMUSCULAR | Status: AC
  Filled 2023-10-03: qty 5

## 2023-10-03 MED ORDER — FUROSEMIDE 10 MG/ML IJ SOLN
40.0000 mg | Freq: Once | INTRAMUSCULAR | Status: AC
  Administered 2023-10-03: 40 mg via INTRAVENOUS
  Filled 2023-10-03: qty 4

## 2023-10-03 MED ORDER — POTASSIUM CHLORIDE CRYS ER 20 MEQ PO TBCR
40.0000 meq | EXTENDED_RELEASE_TABLET | Freq: Once | ORAL | Status: AC
  Administered 2023-10-03: 40 meq via ORAL
  Filled 2023-10-03: qty 2

## 2023-10-03 MED ORDER — DEXAMETHASONE SODIUM PHOSPHATE 10 MG/ML IJ SOLN
INTRAMUSCULAR | Status: AC
  Filled 2023-10-03: qty 1

## 2023-10-03 MED ORDER — ONDANSETRON HCL 4 MG/2ML IJ SOLN
INTRAMUSCULAR | Status: DC | PRN
  Administered 2023-10-03: 4 mg via INTRAVENOUS

## 2023-10-03 MED ORDER — NOREPINEPHRINE BITARTRATE 1 MG/ML IV SOLN
INTRAVENOUS | Status: DC | PRN
  Administered 2023-10-03 (×2): .2 mL via INTRAVENOUS

## 2023-10-03 MED ORDER — FUROSEMIDE 10 MG/ML IJ SOLN
INTRAMUSCULAR | Status: AC
  Filled 2023-10-03: qty 2

## 2023-10-03 MED ORDER — MAGNESIUM SULFATE 4 GM/100ML IV SOLN
4.0000 g | Freq: Once | INTRAVENOUS | Status: AC
  Administered 2023-10-03: 4 g via INTRAVENOUS
  Filled 2023-10-03: qty 100

## 2023-10-03 MED ORDER — MIDODRINE HCL 5 MG PO TABS
10.0000 mg | ORAL_TABLET | Freq: Three times a day (TID) | ORAL | Status: DC
  Administered 2023-10-03 – 2023-10-17 (×38): 10 mg via ORAL
  Filled 2023-10-03 (×41): qty 2

## 2023-10-03 MED ORDER — ROCURONIUM BROMIDE 10 MG/ML (PF) SYRINGE
PREFILLED_SYRINGE | INTRAVENOUS | Status: AC
  Filled 2023-10-03: qty 10

## 2023-10-03 MED ORDER — ROCURONIUM BROMIDE 10 MG/ML (PF) SYRINGE
PREFILLED_SYRINGE | INTRAVENOUS | Status: DC | PRN
  Administered 2023-10-03: 60 mg via INTRAVENOUS

## 2023-10-03 MED ORDER — POTASSIUM & SODIUM PHOSPHATES 280-160-250 MG PO PACK
2.0000 | PACK | ORAL | Status: AC
  Administered 2023-10-03 (×2): 2 via ORAL
  Filled 2023-10-03 (×3): qty 2

## 2023-10-03 MED ORDER — ALBUMIN HUMAN 5 % IV SOLN: INTRAVENOUS | Status: DC | PRN

## 2023-10-03 MED ORDER — PROPOFOL 10 MG/ML IV BOLUS
INTRAVENOUS | Status: DC | PRN
  Administered 2023-10-03: 100 mg via INTRAVENOUS

## 2023-10-03 MED ORDER — SUGAMMADEX SODIUM 200 MG/2ML IV SOLN
INTRAVENOUS | Status: DC | PRN
  Administered 2023-10-03: 200 mg via INTRAVENOUS

## 2023-10-03 MED ORDER — FENTANYL CITRATE (PF) 250 MCG/5ML IJ SOLN
INTRAMUSCULAR | Status: DC | PRN
  Administered 2023-10-03: 50 ug via INTRAVENOUS
  Administered 2023-10-03: 25 ug via INTRAVENOUS
  Administered 2023-10-03: 50 ug via INTRAVENOUS
  Administered 2023-10-03: 25 ug via INTRAVENOUS

## 2023-10-03 MED ORDER — CHLORHEXIDINE GLUCONATE CLOTH 2 % EX PADS
6.0000 | MEDICATED_PAD | Freq: Once | CUTANEOUS | Status: AC
  Administered 2023-10-03: 6 via TOPICAL

## 2023-10-03 MED ORDER — LACTATED RINGERS IV SOLN: INTRAVENOUS | Status: DC | PRN

## 2023-10-03 MED ORDER — 0.9 % SODIUM CHLORIDE (POUR BTL) OPTIME
TOPICAL | Status: DC | PRN
  Administered 2023-10-03: 1000 mL

## 2023-10-03 MED ORDER — BUPIVACAINE LIPOSOME 1.3 % IJ SUSP
INTRAMUSCULAR | Status: AC
Start: 2023-10-03 — End: ?
  Filled 2023-10-03: qty 20

## 2023-10-03 MED ORDER — PROPOFOL 10 MG/ML IV BOLUS
INTRAVENOUS | Status: AC
  Filled 2023-10-03: qty 20

## 2023-10-03 MED ORDER — BUPIVACAINE-EPINEPHRINE 0.25% -1:200000 IJ SOLN
INTRAMUSCULAR | Status: AC
  Filled 2023-10-03: qty 1

## 2023-10-03 MED ORDER — ACETAMINOPHEN 500 MG PO TABS: 1000.0000 mg | ORAL_TABLET | ORAL | Status: DC

## 2023-10-03 MED ORDER — PHENYLEPHRINE 80 MCG/ML (10ML) SYRINGE FOR IV PUSH (FOR BLOOD PRESSURE SUPPORT)
PREFILLED_SYRINGE | INTRAVENOUS | Status: DC | PRN
  Administered 2023-10-03: 160 ug via INTRAVENOUS

## 2023-10-03 MED ORDER — HEPARIN (PORCINE) 25000 UT/250ML-% IV SOLN
2700.0000 [IU]/h | INTRAVENOUS | Status: AC
  Administered 2023-10-04: 2500 [IU]/h via INTRAVENOUS
  Administered 2023-10-04: 2300 [IU]/h via INTRAVENOUS
  Administered 2023-10-05: 2700 [IU]/h via INTRAVENOUS
  Filled 2023-10-03 (×3): qty 250

## 2023-10-03 MED ORDER — DEXAMETHASONE SODIUM PHOSPHATE 10 MG/ML IJ SOLN
INTRAMUSCULAR | Status: DC | PRN
Start: 2023-10-03 — End: 2023-10-03
  Administered 2023-10-03: 5 mg via INTRAVENOUS

## 2023-10-03 MED ORDER — FENTANYL CITRATE (PF) 100 MCG/2ML IJ SOLN
INTRAMUSCULAR | Status: AC
  Filled 2023-10-03: qty 2

## 2023-10-03 MED ORDER — ONDANSETRON HCL 4 MG/2ML IJ SOLN
INTRAMUSCULAR | Status: AC
  Filled 2023-10-03: qty 2

## 2023-10-03 MED ORDER — ALBUMIN HUMAN 25 % IV SOLN
25.0000 g | Freq: Four times a day (QID) | INTRAVENOUS | Status: AC
  Administered 2023-10-03: 25 g via INTRAVENOUS
  Filled 2023-10-03: qty 100

## 2023-10-03 MED ORDER — POTASSIUM CHLORIDE CRYS ER 20 MEQ PO TBCR
40.0000 meq | EXTENDED_RELEASE_TABLET | Freq: Once | ORAL | Status: AC
  Administered 2023-10-04: 40 meq via ORAL
  Filled 2023-10-03: qty 2

## 2023-10-03 MED ORDER — LIDOCAINE 2% (20 MG/ML) 5 ML SYRINGE
INTRAMUSCULAR | Status: DC | PRN
  Administered 2023-10-03: 100 mg via INTRAVENOUS

## 2023-10-03 SURGICAL SUPPLY — 32 items
BAG COUNTER SPONGE SURGICOUNT (BAG) IMPLANT
BLADE SURG 15 STRL LF DISP TIS (BLADE) ×1 IMPLANT
BLADE SURG SZ11 CARB STEEL (BLADE) ×1 IMPLANT
BNDG GAUZE DERMACEA FLUFF 4 (GAUZE/BANDAGES/DRESSINGS) IMPLANT
BRIEF MESH DISP LRG (UNDERPADS AND DIAPERS) ×1 IMPLANT
COVER SURGICAL LIGHT HANDLE (MISCELLANEOUS) ×1 IMPLANT
DRAPE LAPAROTOMY T 102X78X121 (DRAPES) ×1 IMPLANT
ELECT REM PT RETURN 15FT ADLT (MISCELLANEOUS) ×1 IMPLANT
GAUZE 4X4 16PLY ~~LOC~~+RFID DBL (SPONGE) ×1 IMPLANT
GAUZE PAD ABD 8X10 STRL (GAUZE/BANDAGES/DRESSINGS) ×1 IMPLANT
GAUZE SPONGE 4X4 12PLY STRL (GAUZE/BANDAGES/DRESSINGS) ×1 IMPLANT
GLOVE ECLIPSE 8.0 STRL XLNG CF (GLOVE) ×1 IMPLANT
GLOVE INDICATOR 8.0 STRL GRN (GLOVE) ×1 IMPLANT
GOWN STRL REUS W/ TWL XL LVL3 (GOWN DISPOSABLE) ×3 IMPLANT
KIT BASIN OR (CUSTOM PROCEDURE TRAY) ×1 IMPLANT
KIT TURNOVER KIT A (KITS) IMPLANT
NDL HYPO 22X1.5 SAFETY MO (MISCELLANEOUS) ×1 IMPLANT
NEEDLE HYPO 22X1.5 SAFETY MO (MISCELLANEOUS) ×1 IMPLANT
PACK BASIC VI WITH GOWN DISP (CUSTOM PROCEDURE TRAY) ×1 IMPLANT
PACK UNIVERSAL I (CUSTOM PROCEDURE TRAY) IMPLANT
PENCIL SMOKE EVACUATOR (MISCELLANEOUS) IMPLANT
SPONGE T-LAP 18X18 ~~LOC~~+RFID (SPONGE) IMPLANT
SUCTION TUBE FRAZIER 12FR DISP (SUCTIONS) IMPLANT
SURGILUBE 2OZ TUBE FLIPTOP (MISCELLANEOUS) ×1 IMPLANT
SUT CHROMIC 2 0 SH (SUTURE) IMPLANT
SUT VIC AB 2-0 UR6 27 (SUTURE) IMPLANT
SWAB COLLECTION DEVICE MRSA (MISCELLANEOUS) IMPLANT
SWAB CULTURE ESWAB REG 1ML (MISCELLANEOUS) IMPLANT
SYR 20ML LL LF (SYRINGE) ×1 IMPLANT
SYR 3ML LL SCALE MARK (SYRINGE) IMPLANT
SYR BULB IRRIG 60ML STRL (SYRINGE) IMPLANT
TOWEL OR 17X26 10 PK STRL BLUE (TOWEL DISPOSABLE) ×1 IMPLANT

## 2023-10-03 NOTE — Plan of Care (Signed)
   Problem: Education: Goal: Knowledge of General Education information will improve Description Including pain rating scale, medication(s)/side effects and non-pharmacologic comfort measures Outcome: Progressing

## 2023-10-03 NOTE — Anesthesia Procedure Notes (Signed)
 Procedure Name: Intubation Date/Time: 10/03/2023 4:00 PM  Performed by: Dairl Ponder, CRNAPre-anesthesia Checklist: Patient identified, Emergency Drugs available, Suction available and Patient being monitored Patient Re-evaluated:Patient Re-evaluated prior to induction Oxygen Delivery Method: Circle System Utilized Preoxygenation: Pre-oxygenation with 100% oxygen Induction Type: IV induction Ventilation: Mask ventilation without difficulty Laryngoscope Size: Mac and 4 Grade View: Grade I Tube type: Oral Tube size: 7.5 mm Number of attempts: 1 Airway Equipment and Method: Stylet and Oral airway Placement Confirmation: ETT inserted through vocal cords under direct vision, positive ETCO2 and breath sounds checked- equal and bilateral Secured at: 23 cm Tube secured with: Tape Dental Injury: Teeth and Oropharynx as per pre-operative assessment

## 2023-10-03 NOTE — Progress Notes (Signed)
 eLink Physician-Brief Progress Note Patient Name: Mare Ludtke DOB: 1946-05-10 MRN: [id number]   Date of Service  10/03/2023  HPI/Events of Note  Notified of hypokalemia with K at 3.0,  crea 1.07.    eICU Interventions  Replete with oral potassium - PO ordered.      Intervention Category Minor Interventions: Electrolytes abnormality - evaluation and management  Larinda Buttery 10/03/2023, 11:07 PM  6:06 AM K improved to 3.4, Mg 1.7, crea 1.14.   Plan> Replete with oral potassium - PO reordered.  Replete Mg.

## 2023-10-03 NOTE — Progress Notes (Addendum)
 5 Days Post-Op  Subjective: CC: Seen with RN Patient reports no abdominal pain. Tolerating cld without n/v. Having ostomy output. Did not undergo dressing change last night.   Afebrile. Remains on 92mcg/min of levo. WBC 4.1. On amio/heparin for A. Fib.   Objective: Vital signs in last 24 hours: Temp:  [98.4 F (36.9 C)-99.4 F (37.4 C)] 98.5 F (36.9 C) (02/17 0035) Pulse Rate:  [67-87] 76 (02/17 0715) Resp:  [12-30] 16 (02/17 0715) SpO2:  [95 %-98 %] 97 % (02/17 0715) Arterial Line BP: (99-162)/(46-72) 108/49 (02/17 0715) Last BM Date : 10/02/23  Intake/Output from previous day: 02/16 0701 - 02/17 0700 In: 2037.7 [P.O.:730; I.V.:1135.9; IV Piggyback:171.8] Out: 2210 [Urine:1775; Stool:435] Intake/Output this shift: Total I/O In: 39.9 [I.V.:39.9] Out: -   PE: Gen:  Awake on vent, f/c Abd: Soft, ND, NT, no rigidity or guarding, stoma viable with red rubber bridge in place with air and stool in ostomy bag Wound:  Wound as below. Large sacral wound with mostly non-viable tissue at base of the wound. He has edema, erythema with heat extending from left lateral hip extending to the left flank, left lower abdomen and left thigh as seen in the pictures below.             Lab Results:  Recent Labs    10/02/23 0556 10/03/23 0013  WBC 4.0 4.1  HGB 9.8* 9.3*  HCT 29.8* 29.0*  PLT 50* 81*   BMET Recent Labs    10/02/23 0556 10/03/23 0013  NA 133* 132*  K 3.7 3.2*  CL 103 102  CO2 23 19*  GLUCOSE 136* 130*  BUN 34* 27*  CREATININE 1.14 1.14  CALCIUM 7.9* 7.7*   PT/INR No results for input(s): "LABPROT", "INR" in the last 72 hours. CMP     Component Value Date/Time   NA 132 (L) 10/03/2023 0013   NA 138 08/27/2021 1028   K 3.2 (L) 10/03/2023 0013   CL 102 10/03/2023 0013   CO2 19 (L) 10/03/2023 0013   GLUCOSE 130 (H) 10/03/2023 0013   BUN 27 (H) 10/03/2023 0013   BUN 14 08/27/2021 1028   CREATININE 1.14 10/03/2023 0013   CALCIUM 7.7 (L)  10/03/2023 0013   PROT 6.3 (L) 09/26/2023 1706   PROT 8.2 08/27/2021 1028   ALBUMIN 2.5 (L) 09/26/2023 1706   ALBUMIN 3.7 08/27/2021 1028   AST 23 09/26/2023 1706   ALT 50 (H) 09/26/2023 1706   ALKPHOS 104 09/26/2023 1706   BILITOT 1.4 (H) 09/26/2023 1706   BILITOT 0.4 08/27/2021 1028   GFRNONAA >60 10/03/2023 0013   GFRAA 59 (L) 11/25/2016 1704   Lipase  No results found for: "LIPASE"  Studies/Results: No results found.  Anti-infectives: Anti-infectives (From admission, onward)    Start     Dose/Rate Route Frequency Ordered Stop   09/27/23 1100  piperacillin-tazobactam (ZOSYN) IVPB 3.375 g        3.375 g 12.5 mL/hr over 240 Minutes Intravenous Every 8 hours 09/27/23 1052     09/27/23 0600  ceFEPIme (MAXIPIME) 2 g in sodium chloride 0.9 % 100 mL IVPB  Status:  Discontinued        2 g 200 mL/hr over 30 Minutes Intravenous Every 24 hours 09/27/23 0021 09/27/23 1025   09/27/23 0030  vancomycin (VANCOREADY) IVPB 2000 mg/400 mL        2,000 mg 200 mL/hr over 120 Minutes Intravenous  Once 09/27/23 0021 09/27/23 0333   09/27/23 0021  vancomycin  variable dose per unstable renal function (pharmacist dosing)  Status:  Discontinued         Does not apply See admin instructions 09/27/23 0021 09/27/23 0416   09/26/23 2200  cefTRIAXone (ROCEPHIN) 2 g in sodium chloride 0.9 % 100 mL IVPB        2 g 200 mL/hr over 30 Minutes Intravenous  Once 09/26/23 2148 09/26/23 2323        Assessment/Plan POD 5 s/p laparoscopic loop colostomy, excisional sharp debridement of sacral wound by Dr. Dossie Der on 09/28/23 - Afebrile. WBC 4.1. Remains on 8mcg/min of levo - WOCN consult for new ostomy. Plan for them to remove red rubber today/5d post op. Will send them a message.  - Appreciate CCM assistance  -PAD medicine is following the patient.  Note reviewed from 2/16.  Patient and family expressed desire to continue medical interventions. Patient is full code.   - Recommend patient return to the  OR today for exam under anesthesia and additional debridement.  I am concerned about the area of erythema extending into his left hip/flank/abdomen/thigh. He is A&O x 4.The planned procedure and material risks were discussed with the patient. Risks include but are not limited to anesthesia (MI, CVA, prolonged intubation, aspiration, death), pain, bleeding,  infection, scarring, damage to surrounding structures, DVT and PE. We discussed given his wheelchair bound status these wounds will likely never fully heal and require daily/multiple times per day wound care.. We also discussed typical post-operative care including the likely need for rehab/snf. The patient's questions were answered to their satisfaction, they voiced understanding and elected to proceed with surgery. I also updated the patients wife over the phone.  - Discussed w/ attending. Agree's with plan for OR today. While awaiting OR will get CT scan to better evaluate and assist in areas to debride and ensure patient does not need to go to OR sooner.   FEN - NPO. IVF per primary  VTE - SCD's, hold heparin ID - On Zosyn. Cx with E. Coli, Enterococcus faecalis, Pseudomonas. Follow sensitivities to see if need to expand or can narrow abx.  Foley - good uop at 0.6 ml/kg/hr over the last 12 hours. Keep today w/ plans for OR today.    Anemia - s/p 1U 2/11, 2U 2/12. Hgb 9.3 Aspiration PNA L femoral DVT - newly diagnosed this admission Multiple myeloma on lenalidomide  Wheelchair bound HLD GERD     LOS: 7 days    Jacinto Halim, [facility] Surgery 10/03/2023, 8:16 AM Please see Amion for pager number during day hours 7:00am-4:30pm

## 2023-10-03 NOTE — Plan of Care (Signed)
  Problem: Education: Goal: Knowledge of General Education information will improve Description: Including pain rating scale, medication(s)/side effects and non-pharmacologic comfort measures Outcome: Progressing   Problem: Clinical Measurements: Goal: Diagnostic test results will improve Outcome: Progressing   Problem: Activity: Goal: Risk for activity intolerance will decrease Outcome: Progressing   Problem: Coping: Goal: Level of anxiety will decrease Outcome: Progressing   Problem: Pain Managment: Goal: General experience of comfort will improve and/or be controlled Outcome: Progressing   Problem: Respiratory: Goal: Ability to maintain a clear airway and adequate ventilation will improve Outcome: Progressing

## 2023-10-03 NOTE — Progress Notes (Addendum)
 NAME:  Brandon Wiechman, MRN:  [id number], DOB:  August 13, 1946, LOS: 7 ADMISSION DATE:  09/26/2023, CONSULTATION DATE: 09/27/2023 REFERRING MD: Loetta Rough, MD, CHIEF COMPLAINT: hypotension   History of Present Illness:  A 78 y.o. male with multiple myeloma on chemotherapy, dyslipidemia, and GERD.  Brought in by family for increasing lower extremity edema, perineal weeping sores, and worsening generalized weakness for 1-2 weeks.  At baseline patient relatively independent.  He is experience rapid decline in function recently.  Per patient report because he is difficult to move he has been left in his urine and feces.  Per wife the patient experiences pain and does not want to be moved. Smoker. In ED, he was found to be hypotensive and given 2.5 L LR, Vanco, Rocephin, Cefepime, Levophed @ 7 mcg/min now, Rx for hyperK, and HTS for hypoNa.    Pertinent  Medical History  Multiple myeloma on chemotherapy, dyslipidemia, GERD  Significant Hospital Events: Including procedures, antibiotic start and stop dates in addition to other pertinent events   2/11 admit, CCS consult 2/12 Surgical Procedure:  LAPAROSCOPIC LOOP COLOSTOMY  EXCISIONAL SHARP DEBRIDEMENT OF 24 CM TALL X 16 CM WIDE X 4 CM DEEP SACRAL WOUND USING A SCALPEL AND ELECTROCAUTERY \ Left on vent for Afib/shock state postop 2/13 extubated  2/15 on amiodarone, on Levophed 2/17 surgery planning to take back to OR for further wound debridement  Interim History / Subjective:  NAEON. Remains on 3 NE and A.fib on Amio/Heparin. Anasarca with +2.8L net. Surgery plans to take to OR again today for further debridement.  Objective   Blood pressure (!) 178/153, pulse 75, temperature 98.6 F (37 C), temperature source Oral, resp. rate 14, height 5\' 6"  (1.676 m), weight 116.1 kg, SpO2 97%.        Intake/Output Summary (Last 24 hours) at 10/03/2023 1020 Last data filed at 10/03/2023 0846 Gross per 24 hour  Intake 1611.95 ml  Output 2085 ml  Net  -473.05 ml   Filed Weights   09/29/23 0500 10/01/23 0500 10/02/23 0500  Weight: 108.8 kg 117.9 kg 116.1 kg    Examination: General: adult male, chronically ill appearing, resting in bed, in NAD. Neuro: A&O x 3, no deficits. HEENT: [place]/AT. Sclerae anicteric. EOMI. Cardiovascular: IRIR, no M/R/G.  Lungs: Respirations even and unlabored.  CTA bilaterally, No W/R/R. Abdomen: Obese. Ostomy clean. BS x 4, soft, NT/ND.  Musculoskeletal: No gross deformities, 2+ edema/anasarca throughout.  Skin: Multiple sacral/perineal wounds (see images). Left groin and hip erythematous and warm.   Assessment & Plan:   Septic shock secondary to large necrotic sacral ulcer s/p debridement Diverting ostomy 09/28/2023 -Continue NE as needed for goal MAP > 65 - Midodrine added 2/17 - Add 2 doses Albumin - Continue Zosyn - Surgery planning for repeat OR trip for further debridement 2/17 -Continues on wound care with hydrotherapy  Atrial fibrillation - Rate controlled on amiodarone - Continue Amiodarone - Heparin gtt on hold for surgery, resume when able - Maintain K > 4, Mg > 2  Left femoral DVT - Continue Heparin infusion when able after surgery  Anasarca with volume overload - +2.8L net. -Add Albumin x 2 doses followed by Lasix x 1 -Maintain renal perfusion -Avoid nephrotoxic medications  Hypokalemia - s/p repletion Hypophosphatemia - s/p repletion Hypomagnesemia - s/p repletion - K additionally with lasix -Follow BMP post repletion  Anemia, thrombocytopenia - Follow  Wheelchair-bound at baseline Failure to thrive History of multiple myeloma on chemotherapy - Appreciate palliative care follow-up,  currently opting for aggressive measures. I told pt 2/17 that if he has wounds requiring frequent debridement and ongoing sepsis with prolonged hospitalization, we may need to revisit goals of care discussions.   Best Practice (right click and "Reselect all SmartList Selections" daily)    Diet/type: NPO 2/17 for surgery. CLD after DVT prophylaxis: Heparin gtt once back from surgery (was on prior) Pressure ulcer(s): present on admission  GI prophylaxis: oral ppi  Lines: Central line and Arterial Line Foley:  Yes, and it is still needed Code Status:  full code Last date of multidisciplinary goals of care discussion [palliative care medicine continues to follow]   CC time: 35 min.   Rutherford Guys, PA - [facility] For pager details, please see AMION or use Epic chat  After 1900, please call [facility] for cross coverage needs 10/03/2023, 10:41 AM

## 2023-10-03 NOTE — Consult Note (Addendum)
 WOC Nurse ostomy follow up Requested to discontinue red rubber rod today, removed as requested.  No family present during pouch change; Pt is in ICU and watched the procedure but did not participate. Stoma is red and viable, 2 1/4 inches and edematous, slightly above skin level.  Applied barrier ring and 2 piece pouching system. Extra supplies left at the bedside. Use Supplies: barrier ring [place] # [id number] and wafer [place] # 2 and pouch [place] # [id number] Emptied 50cc liquid brown stool.  Enrolled patient in [facility] Secure Start Discharge program: Yes; previously Pt and family will need further teaching sessions when he is stable and out of ICU. Thank-you,  Cammie Mcgee MSN, RN, CWOCN, [place], CNS [phone]

## 2023-10-03 NOTE — Anesthesia Postprocedure Evaluation (Signed)
 Anesthesia Post Note  Patient: Imre Vecchione  Procedure(s) Performed: EXPLORATION OF SACRUM AND BACK WITH THIGH IRRIGATION AND DEBRIDEMENT (Bilateral)     Patient location during evaluation: PACU Anesthesia Type: General Level of consciousness: awake and alert Pain management: pain level controlled Vital Signs Assessment: post-procedure vital signs reviewed and stable Respiratory status: spontaneous breathing, nonlabored ventilation, respiratory function stable and patient connected to nasal cannula oxygen Cardiovascular status: blood pressure returned to baseline and stable Postop Assessment: no apparent nausea or vomiting Anesthetic complications: no   No notable events documented.  Last Vitals:  Vitals:   10/03/23 1830 10/03/23 1845  BP: (!) 167/73   Pulse: 63 66  Resp: 13 15  Temp:  36.4 C  SpO2: 100% 97%    Last Pain:  Vitals:   10/03/23 1900  TempSrc:   PainSc: 0-No pain                 [place]

## 2023-10-03 NOTE — Progress Notes (Signed)
 PT Cancellation Note  Patient Details Name: Matan Steen MRN: [id number] DOB: 02/22/1946   Cancelled Treatment:    Reason Eval/Treat Not Completed: Pain limiting ability to participate Per RN,  patient  reports significant  pain  with any mobility, soon to have dressings changed. Will follow for hydrotherapy only  if indicated per surgery.  Blanchard Kelch PT Acute Rehabilitation Services Office [phone]   Rada Hay 10/03/2023, 9:39 AM

## 2023-10-03 NOTE — TOC Progression Note (Signed)
 Transition of Care [facility]) - Progression Note    Patient Details  Name: Derek [name] MRN: [id number] Date of Birth: 11-05-1945  Transition of Care [facility] CM/SW Contact  Otelia Santee, LCSW Phone Number: 10/03/2023, 11:00 AM  Clinical Narrative:    Spoke with pt's spouse who is agreeable to current recommendation for pt to go to SNF prior to coming home. Pt not medically stable. TOC will follow for medical readiness to being SNF search.    Expected Discharge Plan:  (TBD) Barriers to Discharge: No Barriers Identified  Expected Discharge Plan and Services In-house Referral: Clinical Social Work Discharge Planning Services: NA Post Acute Care Choice:  (Unsure at this time) Living arrangements for the past 2 months: Single Family Home                                       Social Determinants of Health (SDOH) Interventions SDOH Screenings   Food Insecurity: No Food Insecurity (09/27/2023)  Housing: Low Risk  (09/27/2023)  Transportation Needs: No Transportation Needs (09/27/2023)  Utilities: Not At Risk (09/27/2023)  Alcohol Screen: Low Risk  (12/24/2022)  Depression (PHQ2-9): Medium Risk (04/05/2023)  Financial Resource Strain: Low Risk  (12/24/2022)  Physical Activity: Inactive (04/05/2023)  Social Connections: Socially Isolated (09/27/2023)  Stress: Stress Concern Present (04/05/2023)  Tobacco Use: Low Risk  (09/28/2023)    Readmission Risk Interventions     No data to display

## 2023-10-03 NOTE — Op Note (Signed)
 09/26/2023 - 10/03/2023  5:55 PM  PATIENT:  Derek [name]  78 y.o. male  Patient Care Team: Clinic, Derek [name] as PCP - General Derek Fus Coy Saunas, MD as Consulting Physician (Neurosurgery) Derek [name], Derek Pillar, LCSW as [facility] Care Management (Licensed Clinical Social Worker)  PRE-OPERATIVE DIAGNOSIS:  Possible necrotizing fasciitis  POST-OPERATIVE DIAGNOSIS:   Necrotizing fasciitis and Fournier's gangrene of left anterior/posterior thigh, right posterior thigh, sacrum, back, left gluteus  PROCEDURE:  EXCISIONAL DEBRIDEMENT OF BACK, SACRUM, HIPS & THIGHS  SURGEON:  Derek Sportsman, MD  ASSISTANT:  (n/a)   ANESTHESIA:  General endotracheal intubation anesthesia (GETA)  Estimated Blood Loss (EBL):   Total I/O In: 1008.3 [I.V.:358.5; IV Piggyback:649.8] Out: 2250 [Urine:1850; Stool:250; Blood:150].   (See anesthesia record)  Delay start of Pharmacological VTE agent (>24hrs) due to concerns of significant anemia, surgical blood loss, or risk of bleeding?:  no  DRAINS: (None)  SPECIMEN: Necrotic skin/dermis/subcutaneous fat/muscle/periosteum  DISPOSITION OF SPECIMEN:  Pathology  COUNTS:  Sponge, needle, & instrument counts CORRECT  PLAN OF CARE: Admit to inpatient   PATIENT DISPOSITION:  PACU - guarded condition.  INDICATION: Elderly male with multiple myeloma on chemotherapy with rapid decline and bedridden state developing multiple decubiti on sacral and posterior thighs.  Debridement and diverting colostomy done 5 days ago with some improvement.  However felt worse over weekend and back on pressors.  Worsening erythema of left hip and thigh especially.  Progression of necrosis of fat of decubitus.  We recommended operative exploration with incision and debridements.    The anatomy and physiology of skin abscesses was discussed. Pathophysiology of SQ abscess, possible progression to fasciitis & sepsis, etc discussed . I stressed good hygiene & wound care.  Possible redebridement was discussed as well.   Possibility of recurrence was discussed. Risks, benefits, alternatives were discussed. I noted a good likelihood this will help address the problem. Risks of anesthesia and other risks discussed. Questions answered. The patient is does wish to proceed.   OR FINDINGS:  Patient with extensive skin dermal and cutaneous fat necrosis with pockets of wet gangrene in a wide region.  Left posterior gluteus with some muscle necrosis   Consistent with necrotizing fasciitis/Fournier's gangrene.    Extensive debridement done.    Resulting wounds:  Left anterior thigh 10 x 4cm with 10 x 15 cm undermining on the muscle fascia Left posterior thigh 10 x 12 x 7 cm deep wound Right posterior thigh 9 x 6 x 4 cm wound Right posterior hip 6 x 4 x 8 cm deep wound Perineal and sacral decubitus wound 15 x 15 x 5 cm Back wound over left hip  with some muscle necrosis  15 x 9 x 6 cm stellate wound.   Undermining 30 x 18cm contiguous with sacral and left thigh wounds  CASE DATA:  Type of patient?: LDOW CASE (Surgical Hospitalist [facility] Inpatient) Status of Case? EMERGENT Add On Infection Present At Time Of Surgery (PATOS)?  PURULENCE.  DESCRIPTION:   Informed consent was confirmed. The patient received IV antibiotics. The patient underwent general anesthesia without any difficulty. The patient was positioned decubitus left side up.  SCDs were active during the entire case. The back buttocks and thighs were prepped and draped in a sterile fashion. A surgical timeout confirmed our plan.   I treated to use sharp debridement with scalpel scissors and occasionally cutting cautery to excise the eschar is with wet gangrene on the sacrum and buttocks.  I could easily fist  over the left hip trochanter to the left anterior thigh suspicious for necrotizing fasciitis.  Got brackish thinly purulent fluid.  I made a counterincision in the left anterior thigh.  I could not probe  with moderate undermining.  Anterior thigh did not have major necrosis but I ended up having to do a lot of sharp debridement of necrotic fat on the left hip and buttock.  Left gluteus was probed with some areas of necrosis suspicious for necrotizing fasciitis.  Sharply debrided until I got bleeding healthy tissue.  Assured hemostasis.  I then focused on the sacrum and excised the fat necrosis and necrotic skin.  Had to remove necrotic periosteum on the sacrum and coccyx.  Bone on sacrum crumbled consistent with sacral osteomyelitis.  Ended up leaving a [place] of perineum and perianal skin intact.  Horseshoe debridement decubitus.  I continued that on the left posterior thigh.  I can probe into the posterior muscle compartments of the thigh but did not seem necrotic.  I turned attention to the right posterior thigh.  Debrided necrotic eschar and dermis and some subcutaneous tissues.  Muscle viable there.  I focused on the largest area on the back.  I did probe and could get into the right lumbar region and made a counterincision in the right lower back until I got healthier tissue.  Again released brackish fluid.  Excised necrotic skin edges and necrotic dermis.  I went back and reexplored everything, taking care to remove is much necrotic tissue of skin and dermis fat and muscle.  Resulting large wounds x 6 as noted above.  We took extra care to ensure hemostasis.  Ended up packing the wounds with 6 inch rolled Kerlix soaked with saline and a little bit of chlorhexidine x 5 total rolls  Patient is being extubated go to recovery room.   We plan to continue IV antibiotics and begin wound care training tomorrow.   May need OR reexploration in 48 hours to make sure there is no other progression given concerns of fasciitis.  I discussed operative findings, updated the patient's status, discussed probable steps to recovery, and gave postoperative recommendations to the patient's daughter, Derek [name] .   Recommendations were made.  Questions were answered.  She expressed understanding & appreciation.   Derek [name], M.D., F.A.C.S. Gastrointestinal and Minimally Invasive Surgery [facility] [address] [place], [place] [address] [phone] Main / Paging

## 2023-10-03 NOTE — Progress Notes (Signed)
 Patient refused wound care/ dressing changes tonight. Patient is fully alert and oriented, he was educated on the importance of daily wound care. However, he said that he prefers to get it done during the day this time as he needs some rest.

## 2023-10-03 NOTE — Anesthesia Preprocedure Evaluation (Addendum)
 Anesthesia Evaluation    Reviewed: Allergy & Precautions, Patient's Chart, lab work & pertinent test results  Airway Mallampati: II  TM Distance: >3 FB Neck ROM: Full    Dental no notable dental hx.    Pulmonary neg pulmonary ROS   Pulmonary exam normal breath sounds clear to auscultation       Cardiovascular + dysrhythmias Atrial Fibrillation  Rhythm:Regular Rate:Normal  Norepi @ 3 Amio gtt   Neuro/Psych negative neurological ROS     GI/Hepatic Neg liver ROS,GERD  ,,Possible necrotizing fasciitis   Endo/Other    Class 3 obesity  Renal/GU negative Renal ROS     Musculoskeletal negative musculoskeletal ROS (+)    Abdominal   Peds  Hematology  (+) Blood dyscrasia (Plavix)   Anesthesia Other Findings Septic shock due to diffuse pressure ulcers and wounds due to immobility  Reproductive/Obstetrics                              Anesthesia Physical Anesthesia Plan  ASA: 4  Anesthesia Plan: General   Post-op Pain Management:    Induction: Intravenous  PONV Risk Score and Plan: 2 and Ondansetron, Treatment may vary due to age or medical condition and Dexamethasone  Airway Management Planned: Oral ETT  Additional Equipment: Arterial line  Intra-op Plan:   Post-operative Plan: Extubation in OR  Informed Consent: I have reviewed the patients History and Physical, chart, labs and discussed the procedure including the risks, benefits and alternatives for the proposed anesthesia with the patient or authorized representative who has indicated his/her understanding and acceptance.     Dental advisory given  Plan Discussed with: CRNA  Anesthesia Plan Comments: (78 y.o. male with multiple myeloma on chemotherapy, dyslipidemia, and GERD.  Brought in by family for increasing lower extremity edema, perineal weeping sores, and worsening generalized weakness for 1-2 weeks.)          Anesthesia Quick Evaluation

## 2023-10-03 NOTE — Transfer of Care (Signed)
 Immediate Anesthesia Transfer of Care Note  Patient: Derek [name]  Procedure(s) Performed: EXPLORATION OF SACRUM AND BACK WITH THIGH IRRIGATION AND DEBRIDEMENT (Bilateral)  Patient Location: PACU  Anesthesia Type:General  Level of Consciousness: awake and patient cooperative  Airway & Oxygen Therapy: Patient Spontanous Breathing and Patient connected to face mask oxygen  Post-op Assessment: Report given to RN and Post -op Vital signs reviewed and stable  Post vital signs: Reviewed and stable  Last Vitals:  Vitals Value Taken Time  BP 157/70 10/03/23 1819  Temp 36.3 C 10/03/23 1814  Pulse 62 10/03/23 1822  Resp 15 10/03/23 1822  SpO2 100 % 10/03/23 1822  Vitals shown include unfiled device data.  Last Pain:  Vitals:   10/03/23 1814  TempSrc:   PainSc: 0-No pain      Patients Stated Pain Goal: 0 (10/02/23 0958)  Complications: No notable events documented.

## 2023-10-03 NOTE — Progress Notes (Signed)
 [facility] ADULT ICU REPLACEMENT PROTOCOL   The patient does apply for the [facility] Adult ICU Electrolyte Replacment Protocol based on the criteria listed below:   1.Exclusion criteria: TCTS, ECMO, Dialysis, and Myasthenia Gravis patients 2. Is GFR >/= 30 ml/min? Yes.    Patient's GFR today is >60 3. Is SCr </= 2? Yes.   Patient's SCr is 1.14 mg/dL 4. Did SCr increase >/= 0.5 in 24 hours? No. 5.Pt's weight >40kg  Yes.   6. Abnormal electrolyte(s): K+ 3.2, Phos 2.2, Mag 1.5  7. Electrolytes replaced per protocol 8.  Call MD STAT for K+ </= 2.5, Phos </= 1, or Mag </= 1 Physician:  Dr. Wallie Char, Lilia Argue 10/03/2023 6:36 AM

## 2023-10-03 NOTE — Progress Notes (Signed)
 PHARMACY - ANTICOAGULATION CONSULT NOTE  Pharmacy Consult for heparin  Indication: acute DVT  No Known Allergies  Patient Measurements: Height: 5\' 6"  (167.6 cm) Weight: 116.1 kg (256 lb) IBW/kg (Calculated) : 63.8 Heparin Dosing Weight: 88 kg  Vital Signs: Temp: 99.4 F (37.4 C) (02/16 2145) Temp Source: Axillary (02/16 2145) Pulse Rate: 82 (02/17 0100)  Labs: Recent Labs    09/30/23 0414 09/30/23 0840 09/30/23 0840 10/01/23 0352 10/01/23 2033 10/02/23 0556 10/02/23 1448 10/03/23 0013  HGB  --  10.6*   < > 10.4*  --  9.8*  --   --   HCT  --  32.2*  --  32.4*  --  29.8*  --   --   PLT  --  42*  --  42*  --  50*  --   --   HEPARINUNFRC  --   --   --   --    < > <0.10* <0.10* <0.10*  CREATININE 0.97  --   --  1.11  --  1.14  --   --    < > = values in this interval not displayed.    Estimated Creatinine Clearance: 66 mL/min (by C-G formula based on SCr of 1.14 mg/dL).   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 male with limited mobility (wheelchair- bound), multiple myeloma, and sacral wound who presented to the ED on 09/26/2023 with c/o generalized weakness, worsening of LE edema and weeping leg wounds.  LE doppler on 09/27/23 showed acute deep vein thrombosis involving the left  femoral vein. Anticoag. held off at the time due to low blood count. Pharmacy has been consulted on 10/01/23 to start heparin drip.  Per Dr. Wynona Neat via [place] msg on 2/15, no bolus for heparin drip and aim for lower heparin level goal range.  Significant events:  - 2/11: 1 unit PRBC - 2/12: 2 unit PRBC - 2/12:  I&D of sacral wound with loop colostomy.   Today, 10/03/2023: -Heparin level  remains < 0.1 (undetectable) with increase in heparin infusion to 2000 units/hr - Heparin  infusion moved to right internal jugular CVC on 2/16 -Hgb down slightly to 9.8, plts up 50K -No complications of therapy noted, no bleeding during wound care; IV site appears to be infusing well per RN   Goal of Therapy:  Heparin level 0.3-0.5 units/ml Monitor platelets by anticoagulation protocol: Yes   Plan:  -Increase heparin infusion to 2300 units/hr -Check 8 hr heparin level after rate increase -Monitor for s/sx bleeding, follow plts closely    Maryellen Pile, PharmD Clinical Pharmacist 10/03/2023 1:44 AM

## 2023-10-03 NOTE — Progress Notes (Signed)
 PHARMACY - ANTICOAGULATION CONSULT NOTE  Pharmacy Consult for heparin  Indication: acute DVT  No Known Allergies  Patient Measurements: Height: 5\' 6"  (167.6 cm) Weight: 116.1 kg (256 lb) IBW/kg (Calculated) : 63.8 Heparin Dosing Weight: 88 kg  Vital Signs: Temp: 98.5 F (36.9 C) (02/17 0035) Temp Source: Oral (02/17 0035) Pulse Rate: 76 (02/17 0715)  Labs: Recent Labs    10/01/23 0352 10/01/23 2033 10/02/23 0556 10/02/23 1448 10/03/23 0013  HGB 10.4*  --  9.8*  --  9.3*  HCT 32.4*  --  29.8*  --  29.0*  PLT 42*  --  50*  --  81*  HEPARINUNFRC  --    < > <0.10* <0.10* <0.10*  CREATININE 1.11  --  1.14  --  1.14   < > = values in this interval not displayed.    Estimated Creatinine Clearance: 66 mL/min (by C-G formula based on SCr of 1.14 mg/dL).   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 male with limited mobility (wheelchair- bound), multiple myeloma, and sacral wound who presented to the ED on 09/26/2023 with c/o generalized weakness, worsening of LE edema and weeping leg wounds.  LE doppler on 09/27/23 showed acute deep vein thrombosis involving the left  femoral vein. Anticoag. held off at the time due to low blood count.   Pharmacy was consulted to initiate heparin drip on 2/15 with instructions from Dr. Wynona Neat for no bolus and aim for lower heparin level goal range.  Significant events:  - 2/11: 1 unit PRBC - 2/12: 2 unit PRBC - 2/12:  I&D of sacral wound with loop colostomy.   Today, 10/03/2023: Heparin level obtained @ 10:49 = <0.10 is not accurate as UFH was stopped at 10:03 per [facility] CBC: Hgb low but stable, Plt low but trending up  Goal of Therapy:  Heparin level 0.3-0.5 units/ml Monitor platelets by anticoagulation protocol: Yes    Plan: No boluses Heparin drip currently stopped for return to OR for additional debridement of sacral wound. Will await order from [facility] on when safe to resume UFH post-op.   Cindi Carbon, PharmD 10/03/23 8:13 AM

## 2023-10-04 ENCOUNTER — Encounter (HOSPITAL_COMMUNITY): Payer: Self-pay | Admitting: Surgery

## 2023-10-04 DIAGNOSIS — A419 Sepsis, unspecified organism: Secondary | ICD-10-CM | POA: Diagnosis not present

## 2023-10-04 DIAGNOSIS — R6521 Severe sepsis with septic shock: Secondary | ICD-10-CM | POA: Diagnosis not present

## 2023-10-04 DIAGNOSIS — I4891 Unspecified atrial fibrillation: Secondary | ICD-10-CM | POA: Diagnosis not present

## 2023-10-04 DIAGNOSIS — R600 Localized edema: Secondary | ICD-10-CM | POA: Diagnosis not present

## 2023-10-04 LAB — GLUCOSE, CAPILLARY
Glucose-Capillary: 171 mg/dL — ABNORMAL HIGH (ref 70–99)
Glucose-Capillary: 179 mg/dL — ABNORMAL HIGH (ref 70–99)
Glucose-Capillary: 155 mg/dL — ABNORMAL HIGH (ref 70–99)
Glucose-Capillary: 169 mg/dL — ABNORMAL HIGH (ref 70–99)

## 2023-10-04 LAB — CBC
HCT: 25 % — ABNORMAL LOW (ref 39.0–52.0)
Hemoglobin: 8.3 g/dL — ABNORMAL LOW (ref 13.0–17.0)
MCH: 31.3 pg (ref 26.0–34.0)
MCHC: 33.2 g/dL (ref 30.0–36.0)
MCV: 94.3 fL (ref 80.0–100.0)
Platelets: 109 10*3/uL — ABNORMAL LOW (ref 150–400)
RBC: 2.65 MIL/uL — ABNORMAL LOW (ref 4.22–5.81)
RDW: 16.7 % — ABNORMAL HIGH (ref 11.5–15.5)
WBC: 3 10*3/uL — ABNORMAL LOW (ref 4.0–10.5)
nRBC: 0 % (ref 0.0–0.2)

## 2023-10-04 LAB — MAGNESIUM: Magnesium: 1.7 mg/dL (ref 1.7–2.4)

## 2023-10-04 LAB — PHOSPHORUS: Phosphorus: 3.4 mg/dL (ref 2.5–4.6)

## 2023-10-04 LAB — HEPARIN LEVEL (UNFRACTIONATED): Heparin Unfractionated: <0.1 [IU]/mL — ABNORMAL LOW (ref 0.30–0.70)

## 2023-10-04 LAB — BASIC METABOLIC PANEL
Anion gap: 10 (ref 5–15)
BUN: 23 mg/dL (ref 8–23)
CO2: 21 mmol/L — ABNORMAL LOW (ref 22–32)
Calcium: 8 mg/dL — ABNORMAL LOW (ref 8.9–10.3)
Chloride: 101 mmol/L (ref 98–111)
Creatinine, Ser: 1.14 mg/dL (ref 0.61–1.24)
GFR, Estimated: >60 mL/min (ref >60)
Glucose, Bld: 200 mg/dL — ABNORMAL HIGH (ref 70–99)
Potassium: 3.4 mmol/L — ABNORMAL LOW (ref 3.5–5.1)
Sodium: 132 mmol/L — ABNORMAL LOW (ref 135–145)

## 2023-10-04 MED ORDER — ALBUMIN HUMAN 25 % IV SOLN
25.0000 g | Freq: Once | INTRAVENOUS | Status: AC
  Administered 2023-10-04: 25 g via INTRAVENOUS
  Filled 2023-10-04: qty 100

## 2023-10-04 MED ORDER — POTASSIUM CHLORIDE 20 MEQ PO PACK: 40.0000 meq | PACK | Freq: Once | ORAL | Status: DC

## 2023-10-04 MED ORDER — ENSURE ENLIVE PO LIQD
237.0000 mL | Freq: Three times a day (TID) | ORAL | Status: DC
  Administered 2023-10-04 – 2023-10-17 (×27): 237 mL via ORAL

## 2023-10-04 MED ORDER — CLINDAMYCIN PHOSPHATE 900 MG/50ML IV SOLN: 900.0000 mg | Freq: Four times a day (QID) | INTRAVENOUS | Status: DC

## 2023-10-04 MED ORDER — ACETAMINOPHEN 500 MG PO TABS: 1000.0000 mg | ORAL_TABLET | ORAL | Status: AC

## 2023-10-04 MED ORDER — POTASSIUM CHLORIDE CRYS ER 20 MEQ PO TBCR
40.0000 meq | EXTENDED_RELEASE_TABLET | Freq: Once | ORAL | Status: AC
  Administered 2023-10-04: 40 meq via ORAL
  Filled 2023-10-04: qty 2

## 2023-10-04 MED ORDER — MAGNESIUM SULFATE 2 GM/50ML IV SOLN
2.0000 g | Freq: Once | INTRAVENOUS | Status: AC
  Administered 2023-10-04: 2 g via INTRAVENOUS
  Filled 2023-10-04: qty 50

## 2023-10-04 MED ORDER — MAGNESIUM SULFATE 2 GM/50ML IV SOLN: 2.0000 g | Freq: Once | INTRAVENOUS | Status: DC

## 2023-10-04 MED ORDER — LINEZOLID 600 MG/300ML IV SOLN
600.0000 mg | Freq: Two times a day (BID) | INTRAVENOUS | Status: DC
  Administered 2023-10-04 – 2023-10-11 (×15): 600 mg via INTRAVENOUS
  Filled 2023-10-04 (×16): qty 300

## 2023-10-04 MED ORDER — VASOPRESSIN 20 UNITS/100 ML INFUSION FOR SHOCK
0.0300 [IU]/min | INTRAVENOUS | Status: DC
  Administered 2023-10-04 – 2023-10-06 (×5): 0.03 [IU]/min via INTRAVENOUS
  Filled 2023-10-04 (×6): qty 100
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
## 2023-10-04 NOTE — Progress Notes (Signed)
 Patient prefers a FULL LIQUID DIET, soups, mash potatoes, etc. He states he's preferred a liquid diet his entire life. Will notify MD.

## 2023-10-04 NOTE — Progress Notes (Signed)
 OT Cancellation Note  Patient Details Name: Fabrizio Filip MRN: [id number] DOB: February 29, 1946   Cancelled Treatment:    Reason Eval/Treat Not Completed: Other (comment) Patient is TD at bed level with +3 for movement with increased pain. Patient is bed bound at baseline per chart review.OT to sign off at this time.  Rosalio Loud, MS Acute Rehabilitation Department Office# [phone]  10/04/2023, 10:46 AM

## 2023-10-04 NOTE — Progress Notes (Addendum)
 PT Cancellation Note  Patient Details Name: Derek [name] MRN: [id number] DOB: Jul 04, 1946   Cancelled Treatment:    Reason Eval/Treat Not Completed: Other (comment) Patient taken to surgery for  debridement  of wounds. PT will  require new orders if MD wishes for Hydrotherapy to continue. PT will not plan hydrotherapy at this time, unless reordered. Also , PT will sign off on mobility aspect as patient has extensive wounds  and patient was bedbound PTA.   Blanchard Kelch PT Acute Rehabilitation Services Office [phone]    Rada Hay 10/04/2023, 7:47 AM

## 2023-10-04 NOTE — Progress Notes (Signed)
 Patient's blood pressure required frequent attending through the night shift and frequent adjustments/increases to levophed to support. Patient able to converse, swallow pills with thin liquids and reported no complaints of pain. Patient position adjusted using specialty bed and wounds not attended to per MD request. Handoff completed with oncoming RN.

## 2023-10-04 NOTE — Progress Notes (Signed)
 Arterial line has good waveform, draws and flushes blood, insertion site WNL at this time.

## 2023-10-04 NOTE — Progress Notes (Signed)
 [facility] ADULT ICU REPLACEMENT PROTOCOL   The patient does apply for the [facility] Adult ICU Electrolyte Replacment Protocol based on the criteria listed below:   1.Exclusion criteria: TCTS, ECMO, Dialysis, and Myasthenia Gravis patients 2. Is GFR >/= 30 ml/min? Yes.    Patient's GFR today is >60 3. Is SCr </= 2? Yes.   Patient's SCr is 1.14 mg/dL 4. Did SCr increase >/= 0.5 in 24 hours? No. 5.Pt's weight >40kg  Yes.   6. Abnormal electrolyte(s): mag 1.7  7. Electrolytes replaced per protocol 8.  Call MD STAT for K+ </= 2.5, Phos </= 1, or Mag </= 1 Physician:  Dr.Yap  Lolita Lenz 10/04/2023 6:04 AM

## 2023-10-04 NOTE — Progress Notes (Signed)
 NAME:  Derek [name], MRN:  [id number], DOB:  03-31-1946, LOS: 8 ADMISSION DATE:  09/26/2023, CONSULTATION DATE: 09/27/2023 REFERRING MD: Derek Rough, MD, CHIEF COMPLAINT: hypotension   History of Present Illness:  A 78 y.o. male with multiple myeloma on chemotherapy, dyslipidemia, and GERD.  Brought in by family for increasing lower extremity edema, perineal weeping sores, and worsening generalized weakness for 1-2 weeks.  At baseline patient relatively independent.  He is experience rapid decline in function recently.  Per patient report because he is difficult to move he has been left in his urine and feces.  Per wife the patient experiences pain and does not want to be moved. Smoker. In ED, he was found to be hypotensive and given 2.5 L LR, Vanco, Rocephin, Cefepime, Levophed @ 7 mcg/min now, Rx for hyperK, and HTS for hypoNa.    Pertinent  Medical History  Multiple myeloma on chemotherapy, dyslipidemia, GERD  Significant Hospital Events: Including procedures, antibiotic start and stop dates in addition to other pertinent events   2/11 admit, [facility] consult 2/12 Surgical Procedure:  LAPAROSCOPIC LOOP COLOSTOMY  EXCISIONAL SHARP DEBRIDEMENT OF 24 CM TALL X 16 CM WIDE X 4 CM DEEP SACRAL WOUND USING A SCALPEL AND ELECTROCAUTERY \ Left on vent for Afib/shock state postop 2/13 extubated  2/15 on amiodarone, on Levophed 2/17 to OR for debridement, fourniers and nec fasc seen  Interim History / Subjective:  OR for further debridement yesterday. Fourniers and Nec Fasc noted. Plan for return in 48 hours. Now on 14 NE after OR.  Objective   Blood pressure (!) 117/45, pulse 73, temperature 98.3 F (36.8 C), temperature source Oral, resp. rate 15, height 5\' 6"  (1.676 m), weight 120.2 kg, SpO2 97%.        Intake/Output Summary (Last 24 hours) at 10/04/2023 0811 Last data filed at 10/04/2023 0151 Gross per 24 hour  Intake 2376.4 ml  Output 2800 ml  Net -423.6 ml   Filed Weights    10/01/23 0500 10/02/23 0500 10/04/23 0556  Weight: 117.9 kg 116.1 kg 120.2 kg    Examination: General: adult male, chronically ill appearing, resting in bed, in NAD. Neuro: A&O x 3, no deficits. HEENT: [place]/AT. Sclerae anicteric. EOMI. Cardiovascular: IRIR, no M/R/G.  Lungs: Respirations even and unlabored.  CTA bilaterally, No W/R/R. Abdomen: Obese. Ostomy clean. BS x 4, soft, NT/ND.  Musculoskeletal: No gross deformities, 2+ edema/anasarca throughout.  Skin: Multiple sacral/perineal wounds (see images). Not evaluated personally 2/18 2/2 pain after OR   Assessment & Plan:   Septic shock secondary to large necrotic sacral ulcer s/p debridement Diverting ostomy 09/28/2023 -Continue NE as needed for goal MAP > 65 - Midodrine added 2/17 - Additional dose Albumin today - Continue Zosyn, add Linezolid given OR findings - Surgery planning for repeat OR trip for further inspection/possible debridement 2/19 -Continue wound care  Atrial fibrillation - Rate controlled on amiodarone - Continue Amiodarone, Heparin - Maintain K > 4, Mg > 2  Left femoral DVT - Continue Heparin  Anasarca with volume overload - +2.5L net. - Albumin again today - Hold lasix today given increased pressor needs - Maintain renal perfusion - Avoid nephrotoxic medications  Hypokalemia - s/p repletion Hypomagnesemia - s/p repletion - Follow BMP  Anemia, thrombocytopenia - Follow  Wheelchair-bound at baseline Failure to thrive History of multiple myeloma on chemotherapy - Appreciate palliative care follow-up, currently opting for aggressive measures. I told pt 2/17 that if he has wounds requiring frequent debridement and ongoing sepsis with  prolonged hospitalization, we may need to revisit goals of care discussions.   Best Practice (right click and "Reselect all SmartList Selections" daily)   Diet/type: Heart healthy diet for now DVT prophylaxis: Heparin gtt  Pressure ulcer(s): present on admission  GI  prophylaxis: oral ppi  Lines: Central line and Arterial Line Foley:  Yes, and it is still needed Code Status:  full code Last date of multidisciplinary goals of care discussion [palliative care medicine continues to follow]   CC time: 30 min.   Rutherford Guys, PA - [facility] For pager details, please see AMION or use Epic chat  After 1900, please call ELINK for cross coverage needs 10/04/2023, 8:11 AM

## 2023-10-04 NOTE — Plan of Care (Signed)
   Problem: Education: Goal: Knowledge of General Education information will improve Description: Including pain rating scale, medication(s)/side effects and non-pharmacologic comfort measures Outcome: Not Progressing   Problem: Health Behavior/Discharge Planning: Goal: Ability to manage health-related needs will improve Outcome: Not Progressing
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
## 2023-10-04 NOTE — Progress Notes (Signed)
 PHARMACY - ANTICOAGULATION CONSULT NOTE  Pharmacy Consult for heparin  Indication: acute DVT  No Known Allergies  Patient Measurements: Height: 5\' 6"  (167.6 cm) Weight: 120.2 kg (265 lb) IBW/kg (Calculated) : 63.8 Heparin Dosing Weight: 88 kg. Weight has increased during admission, will use heparin dosing weight of 92 kg.  Vital Signs: Temp: 98 F (36.7 C) (02/18 0400) Temp Source: Oral (02/18 0400) BP: 117/45 (02/18 0400) Pulse Rate: 76 (02/18 0400)  Labs: Recent Labs    10/02/23 0556 10/02/23 1448 10/03/23 0013 10/03/23 1049 10/03/23 1510 10/04/23 0450  HGB 9.8*  --  9.3*  --   --  8.3*  HCT 29.8*  --  29.0*  --   --  25.0*  PLT 50*  --  81*  --   --  109*  HEPARINUNFRC <0.10* <0.10* <0.10* <0.10*  --   --   CREATININE 1.14  --  1.14  --  1.07 1.14    Estimated Creatinine Clearance: 67.4 mL/min (by C-G formula based on SCr of 1.14 mg/dL).   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 male with limited mobility (wheelchair- bound), multiple myeloma, and sacral wound who presented to the ED on 09/26/2023 with c/o generalized weakness, worsening of LE edema and weeping leg wounds.  LE doppler on 09/27/23 showed acute deep vein thrombosis involving the left  femoral vein. Anticoag. held off at the time due to low blood count.   Pharmacy was consulted to initiate heparin drip on 2/15 with instructions from Dr. Wynona Neat for no bolus and aim for lower heparin level goal range.  Significant events:  - 2/11: 1 unit PRBC - 2/12: 2 unit PRBC - 2/12:  I&D of sacral wound with loop colostomy.  - 2/17: I&D of sacral wound. UFH held, resumed 8 hours post-op per [facility] instructions - 2/19: Scheduled return to OR for exploration  Today, 10/04/2023: Heparin level =  <0.10 remains undetectable despite increase rate of infusion to 2300 units/hr CBC: Hgb low and slightly decreased, Plt low but trending up No bleeding reported  Patient scheduled for return to OR on 2/19 @ 10:45. Instructions from [facility] to hold UFH 6 hours prior to surgery. Discussed persistently undetectable heparin level with CCM, will continue adjusting UFH at this time, potential to change to LMWH after surgery.  Goal of Therapy:  Heparin level 0.3-0.5 units/ml Monitor platelets by anticoagulation protocol: Yes   Plan: No boluses Increase heparin infusion to 2500 units/hr Check heparin level 8 hours after rate change CBC, heparin level daily Monitor for signs of bleeding  Stop heparin drip on 2/19 @ 04:30 (6 hours prior to scheduled surgery).  Cindi Carbon, PharmD 10/04/23 7:22 AM

## 2023-10-05 ENCOUNTER — Inpatient Hospital Stay (HOSPITAL_COMMUNITY): Payer: No Typology Code available for payment source | Admitting: Anesthesiology

## 2023-10-05 ENCOUNTER — Other Ambulatory Visit: Payer: Self-pay

## 2023-10-05 ENCOUNTER — Encounter (HOSPITAL_COMMUNITY)
Admission: EM | Disposition: A | Discharge status: Other Acute Inpatient Hospital | Payer: Self-pay | Source: Home / Self Care | Attending: Family Medicine

## 2023-10-05 DIAGNOSIS — E871 Hypo-osmolality and hyponatremia: Secondary | ICD-10-CM | POA: Diagnosis not present

## 2023-10-05 DIAGNOSIS — D62 Acute posthemorrhagic anemia: Secondary | ICD-10-CM

## 2023-10-05 DIAGNOSIS — M726 Necrotizing fasciitis: Secondary | ICD-10-CM

## 2023-10-05 DIAGNOSIS — I4891 Unspecified atrial fibrillation: Secondary | ICD-10-CM | POA: Diagnosis not present

## 2023-10-05 DIAGNOSIS — A419 Sepsis, unspecified organism: Secondary | ICD-10-CM | POA: Diagnosis not present

## 2023-10-05 DIAGNOSIS — N493 Fournier gangrene: Secondary | ICD-10-CM | POA: Diagnosis not present

## 2023-10-05 HISTORY — PX: LAPAROTOMY: SHX154

## 2023-10-05 HISTORY — PX: DRESSING CHANGE UNDER ANESTHESIA: SHX5237

## 2023-10-05 HISTORY — PX: WOUND DEBRIDEMENT: SHX247

## 2023-10-05 LAB — BASIC METABOLIC PANEL
Anion gap: 9 (ref 5–15)
BUN: 19 mg/dL (ref 8–23)
CO2: 21 mmol/L — ABNORMAL LOW (ref 22–32)
Calcium: 7.8 mg/dL — ABNORMAL LOW (ref 8.9–10.3)
Chloride: 104 mmol/L (ref 98–111)
Creatinine, Ser: 0.76 mg/dL (ref 0.61–1.24)
GFR, Estimated: >60 mL/min (ref >60)
Glucose, Bld: 149 mg/dL — ABNORMAL HIGH (ref 70–99)
Potassium: 3.5 mmol/L (ref 3.5–5.1)
Sodium: 134 mmol/L — ABNORMAL LOW (ref 135–145)

## 2023-10-05 LAB — CBC
HCT: 20.5 % — ABNORMAL LOW (ref 39.0–52.0)
Hemoglobin: 6.8 g/dL — CL (ref 13.0–17.0)
MCH: 31.3 pg (ref 26.0–34.0)
MCHC: 33.2 g/dL (ref 30.0–36.0)
MCV: 94.5 fL (ref 80.0–100.0)
Platelets: 146 10*3/uL — ABNORMAL LOW (ref 150–400)
RBC: 2.17 MIL/uL — ABNORMAL LOW (ref 4.22–5.81)
RDW: 16.8 % — ABNORMAL HIGH (ref 11.5–15.5)
WBC: 3.9 10*3/uL — ABNORMAL LOW (ref 4.0–10.5)
nRBC: 0 % (ref 0.0–0.2)

## 2023-10-05 LAB — HEPARIN LEVEL (UNFRACTIONATED): Heparin Unfractionated: 0.18 [IU]/mL — ABNORMAL LOW (ref 0.30–0.70)

## 2023-10-05 LAB — PREPARE RBC (CROSSMATCH)

## 2023-10-05 LAB — GLUCOSE, CAPILLARY
Glucose-Capillary: 153 mg/dL — ABNORMAL HIGH (ref 70–99)
Glucose-Capillary: 174 mg/dL — ABNORMAL HIGH (ref 70–99)
Glucose-Capillary: 185 mg/dL — ABNORMAL HIGH (ref 70–99)
Glucose-Capillary: 170 mg/dL — ABNORMAL HIGH (ref 70–99)

## 2023-10-05 LAB — HEMOGLOBIN AND HEMATOCRIT, BLOOD
HCT: 27.2 % — ABNORMAL LOW (ref 39.0–52.0)
Hemoglobin: 9 g/dL — ABNORMAL LOW (ref 13.0–17.0)

## 2023-10-05 LAB — PHOSPHORUS: Phosphorus: 2.2 mg/dL — ABNORMAL LOW (ref 2.5–4.6)

## 2023-10-05 LAB — SURGICAL PATHOLOGY

## 2023-10-05 LAB — MAGNESIUM: Magnesium: 1.6 mg/dL — ABNORMAL LOW (ref 1.7–2.4)

## 2023-10-05 SURGERY — LAPAROTOMY, EXPLORATORY
Anesthesia: General

## 2023-10-05 MED ORDER — LIDOCAINE HCL (PF) 2 % IJ SOLN
INTRAMUSCULAR | Status: AC
  Filled 2023-10-05: qty 5

## 2023-10-05 MED ORDER — ACETAMINOPHEN 325 MG PO TABS: 325.0000 mg | ORAL_TABLET | ORAL | Status: DC | PRN

## 2023-10-05 MED ORDER — FENTANYL CITRATE PF 50 MCG/ML IJ SOSY: 25.0000 ug | PREFILLED_SYRINGE | INTRAMUSCULAR | Status: DC | PRN

## 2023-10-05 MED ORDER — ROCURONIUM BROMIDE 100 MG/10ML IV SOLN
INTRAVENOUS | Status: DC | PRN
  Administered 2023-10-05: 80 mg via INTRAVENOUS

## 2023-10-05 MED ORDER — HEPARIN (PORCINE) 25000 UT/250ML-% IV SOLN: 2500.0000 [IU]/h | INTRAVENOUS | Status: DC

## 2023-10-05 MED ORDER — ROCURONIUM BROMIDE 10 MG/ML (PF) SYRINGE
PREFILLED_SYRINGE | INTRAVENOUS | Status: AC
Start: 2023-10-05 — End: ?
  Filled 2023-10-05: qty 10

## 2023-10-05 MED ORDER — MAGNESIUM SULFATE 4 GM/100ML IV SOLN
4.0000 g | Freq: Once | INTRAVENOUS | Status: AC
  Administered 2023-10-05: 4 g via INTRAVENOUS
  Filled 2023-10-05: qty 100

## 2023-10-05 MED ORDER — ONDANSETRON HCL 4 MG/2ML IJ SOLN
INTRAMUSCULAR | Status: DC | PRN
  Administered 2023-10-05: 4 mg via INTRAVENOUS

## 2023-10-05 MED ORDER — SODIUM PHOSPHATES 45 MMOLE/15ML IV SOLN
30.0000 mmol | Freq: Once | INTRAVENOUS | Status: AC
  Administered 2023-10-05: 30 mmol via INTRAVENOUS
  Filled 2023-10-05: qty 10

## 2023-10-05 MED ORDER — OXYCODONE HCL 5 MG PO TABS: 5.0000 mg | ORAL_TABLET | Freq: Once | ORAL | Status: DC | PRN

## 2023-10-05 MED ORDER — PROPOFOL 10 MG/ML IV BOLUS
INTRAVENOUS | Status: AC
  Filled 2023-10-05: qty 20

## 2023-10-05 MED ORDER — ONDANSETRON HCL 4 MG/2ML IJ SOLN: 4.0000 mg | Freq: Once | INTRAMUSCULAR | Status: DC | PRN

## 2023-10-05 MED ORDER — ONDANSETRON HCL 4 MG/2ML IJ SOLN
INTRAMUSCULAR | Status: AC
  Filled 2023-10-05: qty 2

## 2023-10-05 MED ORDER — MAGNESIUM SULFATE 2 GM/50ML IV SOLN
2.0000 g | Freq: Once | INTRAVENOUS | Status: DC
  Filled 2023-10-05: qty 50

## 2023-10-05 MED ORDER — OXYCODONE HCL 5 MG/5ML PO SOLN: 5.0000 mg | Freq: Once | ORAL | Status: DC | PRN

## 2023-10-05 MED ORDER — 0.9 % SODIUM CHLORIDE (POUR BTL) OPTIME
TOPICAL | Status: DC | PRN
Start: 2023-10-05 — End: 2023-10-05
  Administered 2023-10-05: 1000 mL

## 2023-10-05 MED ORDER — DEXAMETHASONE SODIUM PHOSPHATE 10 MG/ML IJ SOLN
INTRAMUSCULAR | Status: AC
  Filled 2023-10-05: qty 1

## 2023-10-05 MED ORDER — FENTANYL CITRATE (PF) 100 MCG/2ML IJ SOLN
INTRAMUSCULAR | Status: AC
  Filled 2023-10-05: qty 2

## 2023-10-05 MED ORDER — MEPERIDINE HCL 50 MG/ML IJ SOLN: 6.2500 mg | INTRAMUSCULAR | Status: DC | PRN

## 2023-10-05 MED ORDER — ACETAMINOPHEN 160 MG/5ML PO SOLN: 325.0000 mg | ORAL | Status: DC | PRN

## 2023-10-05 MED ORDER — LIDOCAINE HCL (CARDIAC) PF 100 MG/5ML IV SOSY
PREFILLED_SYRINGE | INTRAVENOUS | Status: DC | PRN
  Administered 2023-10-05: 80 mg via INTRAVENOUS

## 2023-10-05 MED ORDER — SUGAMMADEX SODIUM 200 MG/2ML IV SOLN
INTRAVENOUS | Status: DC | PRN
Start: 2023-10-05 — End: 2023-10-05
  Administered 2023-10-05: 200 mg via INTRAVENOUS

## 2023-10-05 MED ORDER — LACTATED RINGERS IV SOLN: INTRAVENOUS | Status: DC | PRN

## 2023-10-05 MED ORDER — PROPOFOL 10 MG/ML IV BOLUS
INTRAVENOUS | Status: DC | PRN
  Administered 2023-10-05: 120 mg via INTRAVENOUS

## 2023-10-05 MED ORDER — POTASSIUM CHLORIDE 10 MEQ/50ML IV SOLN
10.0000 meq | INTRAVENOUS | Status: AC
  Administered 2023-10-05 (×4): 10 meq via INTRAVENOUS
  Filled 2023-10-05 (×4): qty 50

## 2023-10-05 MED ORDER — SODIUM CHLORIDE 0.9% IV SOLUTION: Freq: Once | INTRAVENOUS | Status: AC

## 2023-10-05 MED ORDER — STERILE WATER FOR IRRIGATION IR SOLN
Status: DC | PRN
  Administered 2023-10-05: 1000 mL

## 2023-10-05 SURGICAL SUPPLY — 42 items
BAG COUNTER SPONGE SURGICOUNT (BAG) IMPLANT
BLADE EXTENDED COATED 6.5IN (ELECTRODE) IMPLANT
BLADE HEX COATED 2.75 (ELECTRODE) IMPLANT
BNDG GAUZE DERMACEA FLUFF 4 (GAUZE/BANDAGES/DRESSINGS) IMPLANT
CHLORAPREP W/TINT 26 (MISCELLANEOUS) ×2 IMPLANT
COUNTER NEEDLE 1200 MAGNETIC (NEEDLE) ×2 IMPLANT
COVER MAYO STAND STRL (DRAPES) ×2 IMPLANT
COVER SURGICAL LIGHT HANDLE (MISCELLANEOUS) ×2 IMPLANT
DRAIN CHANNEL 19F RND (DRAIN) IMPLANT
DRAPE LAPAROSCOPIC ABDOMINAL (DRAPES) ×2 IMPLANT
DRAPE SHEET LG 3/4 BI-LAMINATE (DRAPES) ×2 IMPLANT
DRAPE UTILITY XL STRL (DRAPES) ×4 IMPLANT
DRAPE WARM FLUID 44X44 (DRAPES) ×2 IMPLANT
DRSG OPSITE POSTOP 4X10 (GAUZE/BANDAGES/DRESSINGS) IMPLANT
DRSG OPSITE POSTOP 4X6 (GAUZE/BANDAGES/DRESSINGS) IMPLANT
DRSG OPSITE POSTOP 4X8 (GAUZE/BANDAGES/DRESSINGS) IMPLANT
ELECT REM PT RETURN 15FT ADLT (MISCELLANEOUS) ×2 IMPLANT
GAUZE PAD ABD 8X10 STRL (GAUZE/BANDAGES/DRESSINGS) IMPLANT
GAUZE SPONGE 4X4 12PLY STRL (GAUZE/BANDAGES/DRESSINGS) ×2 IMPLANT
GLOVE ECLIPSE 8.0 STRL XLNG CF (GLOVE) ×4 IMPLANT
GLOVE INDICATOR 8.0 STRL GRN (GLOVE) ×4 IMPLANT
GOWN STRL REUS W/ TWL XL LVL3 (GOWN DISPOSABLE) ×6 IMPLANT
HANDLE SUCTION POOLE (INSTRUMENTS) ×2 IMPLANT
KIT BASIN OR (CUSTOM PROCEDURE TRAY) ×2 IMPLANT
KIT TURNOVER KIT A (KITS) IMPLANT
LEGGING LITHOTOMY PAIR STRL (DRAPES) IMPLANT
PACK GENERAL/GYN (CUSTOM PROCEDURE TRAY) ×2 IMPLANT
PACK UNIVERSAL I (CUSTOM PROCEDURE TRAY) IMPLANT
PAD POSITIONING PINK XL (MISCELLANEOUS) ×2 IMPLANT
STAPLER SKIN PROX WIDE 3.9 (STAPLE) ×2 IMPLANT
SUCTION POOLE HANDLE (INSTRUMENTS) IMPLANT
SUT MNCRL AB 4-0 PS2 18 (SUTURE) IMPLANT
SUT PDS AB 1 TP1 96 (SUTURE) ×4 IMPLANT
SUT PROLENE 2 0 SH DA (SUTURE) IMPLANT
SUT SILK 0 30XBRD TIE 6 (SUTURE) IMPLANT
SUT SILK 2 0 SH CR/8 (SUTURE) ×2 IMPLANT
SUT SILK 2-0 18XBRD TIE 12 (SUTURE) IMPLANT
SUT SILK 3-0 18XBRD TIE 12 (SUTURE) IMPLANT
SUT VICRYL 0 UR6 27IN ABS (SUTURE) IMPLANT
TAPE UMBILICAL 1/8 X36 TWILL (MISCELLANEOUS) IMPLANT
TOWEL OR 17X26 10 PK STRL BLUE (TOWEL DISPOSABLE) ×4 IMPLANT
TRAY FOLEY MTR SLVR 16FR STAT (SET/KITS/TRAYS/PACK) ×2 IMPLANT

## 2023-10-05 NOTE — Transfer of Care (Signed)
 Immediate Anesthesia Transfer of Care Note  Patient: Derek [name]  Procedure(s) Performed: Johnsie Cancel OF BACK, HIPS, THIGHS REDEBRIDEMENT WOUND (Left) DRESSING CHANGE UNDER ANESTHESIA  Patient Location: PACU  Anesthesia Type:General  Level of Consciousness: sedated  Airway & Oxygen Therapy: Patient Spontanous Breathing and Patient connected to face mask oxygen  Post-op Assessment: Report given to RN and Post -op Vital signs reviewed and stable  Post vital signs: Reviewed and stable  Last Vitals:  Vitals Value Taken Time  BP    Temp    Pulse 48 10/05/23 1533  Resp 18 10/05/23 1533  SpO2 100 % 10/05/23 1533  Vitals shown include unfiled device data.  Last Pain:  Vitals:   10/05/23 1307  TempSrc: Oral  PainSc:       Patients Stated Pain Goal: 0 (10/02/23 0958)  Complications: No notable events documented.

## 2023-10-05 NOTE — Progress Notes (Signed)
 PHARMACY - ANTICOAGULATION CONSULT NOTE  Pharmacy Consult for heparin  Indication: acute DVT  No Known Allergies  Patient Measurements: Height: 5\' 6"  (167.6 cm) Weight: 120.2 kg (265 lb) IBW/kg (Calculated) : 63.8 Heparin Dosing Weight: 88 kg. Weight has increased during admission, will use heparin dosing weight of 92 kg.  Vital Signs: Temp: 98.1 F (36.7 C) (02/18 2350) Temp Source: Oral (02/18 2350) BP: 164/56 (02/18 2000) Pulse Rate: 52 (02/18 2045)  Labs: Recent Labs    10/02/23 0556 10/02/23 1448 10/03/23 0013 10/03/23 1049 10/03/23 1510 10/04/23 0450 10/04/23 1149 10/04/23 2337  HGB 9.8*  --  9.3*  --   --  8.3*  --   --   HCT 29.8*  --  29.0*  --   --  25.0*  --   --   PLT 50*  --  81*  --   --  109*  --   --   HEPARINUNFRC <0.10*   < > <0.10* <0.10*  --   --  <0.10* 0.18*  CREATININE 1.14  --  1.14  --  1.07 1.14  --   --    < > = values in this interval not displayed.    Estimated Creatinine Clearance: 67.4 mL/min (by C-G formula based on SCr of 1.14 mg/dL).   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 male with limited mobility (wheelchair- bound), multiple myeloma, and sacral wound who presented to the ED on 09/26/2023 with c/o generalized weakness, worsening of LE edema and weeping leg wounds.  LE doppler on 09/27/23 showed acute deep vein thrombosis involving the left  femoral vein. Anticoag. held off at the time due to low blood count.   Pharmacy was consulted to initiate heparin drip on 2/15 with instructions from Dr. Wynona Neat for no bolus and aim for lower heparin level goal range.  Significant events:  - 2/11: 1 unit PRBC - 2/12: 2 unit PRBC - 2/12:  I&D of sacral wound with loop colostomy.  - 2/17: I&D of sacral wound. UFH held,  resumed 8 hours post-op per [facility] instructions - 2/19: Scheduled return to OR for exploration  Today, 10/05/2023: Heparin level = <0.10 remains undetectable despite increase rate of infusion to 2300 units/hr CBC: Hgb low and slightly decreased, Plt low but trending up No bleeding reported   Repeat heparin level 0.18 still subtherapeutic despite increase  Patient scheduled for return to OR on 2/19 @ 10:45. Instructions from [facility] to hold UFH 6 hours prior to surgery. Discussed persistently undetectable heparin level with CCM, will continue adjusting UFH at this time, potential to change to LMWH after surgery.  Goal of Therapy:  Heparin level 0.3-0.5 units/ml Monitor platelets by anticoagulation protocol: Yes   Plan: No boluses Increase heparin infusion to 2700 units/hr CBC, heparin level daily Monitor for signs of bleeding  Stop heparin drip on 2/19 @ 04:30 (6 hours prior to scheduled surgery).  Arley Phenix RPh 10/05/2023, 2:40 AM

## 2023-10-05 NOTE — Anesthesia Postprocedure Evaluation (Signed)
 Anesthesia Post Note  Patient: Derek [name]  Procedure(s) Performed: Johnsie Cancel OF BACK, HIPS, THIGHS REDEBRIDEMENT WOUND (Left) DRESSING CHANGE UNDER ANESTHESIA     Patient location during evaluation: PACU Anesthesia Type: General Level of consciousness: awake and alert Pain management: pain level controlled Vital Signs Assessment: post-procedure vital signs reviewed and stable Respiratory status: spontaneous breathing, nonlabored ventilation, respiratory function stable and patient connected to nasal cannula oxygen Cardiovascular status: blood pressure returned to baseline and stable Postop Assessment: no apparent nausea or vomiting Anesthetic complications: no   No notable events documented.  Last Vitals:  Vitals:   10/05/23 1315 10/05/23 1329  BP:    Pulse: (!) 57 (!) 55  Resp: 16 (!) 22  Temp:  36.8 C  SpO2: 95%     Last Pain:  Vitals:   10/05/23 1307  TempSrc: Oral  PainSc:                  [name]

## 2023-10-05 NOTE — Op Note (Signed)
 09/26/2023 - 10/05/2023 2:58 PM  PATIENT:  Derek [name]  78 y.o. male  Patient Care Team: [facility], [facility] as PCP - General Maisie Fus Coy Saunas, MD as Consulting Physician (Neurosurgery) Randa Spike, [name], LCSW as [facility] Care Management (Licensed Clinical Social Worker) Dettinger, Elige Radon, MD as Consulting Physician (Family Medicine)  PRE-OPERATIVE DIAGNOSIS:  Necrotizing fasciitis  POST-OPERATIVE DIAGNOSIS:   Necrotizing fasciitis and Fournier's gangrene of left anterior/posterior thigh, right posterior thigh, sacrum, back, left gluteus   PROCEDURE:   RE-DEBRIDEMENT OF BACK, SACRUM, BILATERAL HIPS, BILATERAL THIGHS DRESSING CHANGE UNDER ANESTHESIA  SURGEON:  Ardeth Sportsman, MD  ASSISTANT:  (n/a)   ANESTHESIA:  General endotracheal intubation anesthesia (GETA)  Estimated Blood Loss (EBL):   Total I/O In: 1186 [I.V.:195.4; Blood:300; IV Piggyback:690.5] Out: - .   (See anesthesia record)  Delay start of Pharmacological VTE agent (>24hrs) due to concerns of significant anemia, surgical blood loss, or risk of bleeding?:  Okay to restart anticoagulation for DVT 6 hours postop = 9 PM  DRAINS: (None)  SPECIMEN:  (no specimen)  DISPOSITION OF SPECIMEN:  (not applicable)  COUNTS:  Sponge, needle, & instrument counts CORRECT  PLAN OF CARE: Admit to inpatient   PATIENT DISPOSITION:  PACU - guarded condition.  INDICATION: Elderly male with multiple myeloma on chemotherapy with rapid decline and bedridden state developing multiple decubiti on sacral and posterior thighs.  Debridement and diverting colostomy done 5 days ago with some improvement.  However felt worse over weekend and back on pressors.  Worsening erythema of left hip and thigh especially.  Progression of necrosis of fat of decubitus.  Underwent emergent redebridement 48 hours ago with evidence of necrotizing fasciitis/Fournier's gangrene and osteomyelitis.  Still on pressors but weaning down.   Getting transfused for anemia.  Fully anticoagulated given new onset DVT.  Recommended 48-hour reexamination and probable redebridement/dressing change.     The anatomy and physiology of skin abscesses was discussed. Pathophysiology of SQ abscess, possible progression to fasciitis & sepsis, etc discussed . I stressed good hygiene & wound care. Possible redebridement was discussed as well.    Possibility of recurrence was discussed. Risks, benefits, alternatives were discussed. I noted a good likelihood this will help address the problem. Risks of anesthesia and other risks discussed. Questions answered. The patient is does wish to proceed.  OR FINDINGS:  Patient with moderate/normal necrosis, significant progressive cutaneous fat necrosis in a wide region.  Left posterior gluteus with some progressive necrosis.  Crepitus extending on left anterior distal thigh and medial thigh towards left groin.  Opened up without any proof of fasciitis.  No further extension on right back or left lateral hip.  Progressive necrosis on right posterior thigh and coccyx   Consistent with necrotizing fasciitis/Fournier's gangrene -much better controlled.   Extensive debridement done using sharp scissors, scalpel, and cautery.     Resulting wounds:  Left anterior thigh 10 x 4cm proximal and 7 x 4 cm distal wounds with 20 x 25 cm undermining on the muscle fascia Left posterior thigh 10 x 12 x 7 cm deep wound Right posterior thigh 13 x 8 x 4 cm wound Right posterior hip 6 x 4 x 8 cm deep wound Perineal and sacral decubitus wound 15 x 15 x 5 cm Back wound over left hip  with some muscle necrosis -less debridement needed. 15 x 10 x 6 cm stellate wound.   Undermining 35 x 20cm contiguous with sacral and left thigh wounds   CASE  DATA:  Type of patient?: LDOW CASE (Surgical Hospitalist [facility] Inpatient) Status of Case? EMERGENT Add On Infection Present At Time Of Surgery (PATOS)?   PURULENCE.            DESCRIPTION: Informed consent was confirmed. The patient received IV antibiotics.  Heparin was held 6 hours preop.  Concern for dropping hemoglobin so it already in the process of receiving 1 unit of packed red blood cells.  The patient underwent general anesthesia without any difficulty. The patient was positioned decubitus left side up.  SCDs were active during the entire case.  All the Kerlix is removed the back, perirectal, perineal, buttocks and thighs were prepped and draped in a sterile fashion. A surgical timeout confirmed our plan.    I focused on the left side.  There was some progressive blistering on the left lateral and anterior skin.  Still some persistent edema but erythema last.  I could probe crepitus on the more distal thigh anteriorly.  Ended up doing another counterincision 20 cm more distally.  Fascia and muscle appeared viable.  Deep subcu tissues viable.  Crepitus carried medially as well with viability there.    Then focused in the upper back.  Debrided some progressive necrotic fat and some muscle in the left gluteus and subcutaneous tissues over the left lateral hip.  Explored the back but there is no cephalad extension.  Sharply debrided the lumbar region.  Sacrum periosteum with some viability.  Periosteum on coccyx became necrotic.  Sharply debrided.  Somewhat friable mostly intact coccyx with some bleeding of the bone implying viability so I did not do a coccygectomy.  I did meticulous debridement of necrotic fat skin and tissue around the perianal region trying to preserve that [place] of skin and fat between the scrotum perineal body and anus.  Still with significant horseshoe defect  Focused on the right posterior back.  That counterincision was viable and no evidence of any progressive crepitus superiorly or right posterior flank.  Still connected with the main Back wound.  Focused on the right posterior thigh wound.  Significant necrosis  progressive of subcutaneous fat was sharply debrided.  Came into the posterior muscle compartment and noted viable fascial muscle.  There was some extension medially heading towards the medial posterior thigh and the base of the scrotum.  Scrotal skin still viable.    I went back and reexplored everything, taking care to remove is much necrotic tissue of skin and dermis fat and muscle.  Resulting large wounds x 6 as noted above.  We took extra care to ensure hemostasis.  Ended up packing the wounds with 6 inch rolled Kerlix soaked with saline and a little bit of chlorhexidine x 5 total rolls   Patient is being extubated go to recovery room.   We plan to continue IV antibiotics.  Second unit of ordered blood being transfused.  Patient claims to have denied much pain and rather a sensate which is concerning.  Vertical care if there are any neurological issues since I do not think he has true paraplegia.  Will begin bedside dressing changes tomorrow.   I am guardedly hopeful we can avoid any reoperative debridement unless he does not improve clinically after transfusion.  We will see.  I am concerned about the grim prognosis given the massive necrosis and bedridden state and agree with palliative care help following and goals of care.  I discussed operative findings, updated the patient's status, discussed probable steps to recovery, and gave  postoperative recommendations to the patient's spouse, Virgia Land.  S[place]sky .  Recommendations were made.  Questions were answered.  She expressed understanding & appreciation.    Ardeth Sportsman, M.D., F.A.C.S. Gastrointestinal and Minimally Invasive Surgery [facility] [address], [address] [place], [place] [address] [phone] Main / Paging

## 2023-10-05 NOTE — Anesthesia Preprocedure Evaluation (Addendum)
 Anesthesia Evaluation    Reviewed: Allergy & Precautions, Patient's Chart, lab work & pertinent test results  Airway Mallampati: II  TM Distance: >3 FB Neck ROM: Full    Dental no notable dental hx.    Pulmonary neg pulmonary ROS   Pulmonary exam normal breath sounds clear to auscultation       Cardiovascular + dysrhythmias Atrial Fibrillation  Rhythm:Regular Rate:Normal  Norepi @ 3 Amio gtt   Neuro/Psych negative neurological ROS     GI/Hepatic Neg liver ROS,GERD  ,,Possible necrotizing fasciitis   Endo/Other    Class 3 obesity  Renal/GU negative Renal ROS     Musculoskeletal negative musculoskeletal ROS (+)    Abdominal   Peds  Hematology  (+) Blood dyscrasia (Plavix)   Anesthesia Other Findings Septic shock due to diffuse pressure ulcers and wounds due to immobility  Reproductive/Obstetrics                             Anesthesia Physical Anesthesia Plan  ASA: 4 and emergent  Anesthesia Plan: General   Post-op Pain Management: Minimal or no pain anticipated   Induction: Intravenous  PONV Risk Score and Plan: 2 and Ondansetron, Treatment may vary due to age or medical condition and Dexamethasone  Airway Management Planned: Oral ETT  Additional Equipment: Arterial line  Intra-op Plan:   Post-operative Plan: Extubation in OR  Informed Consent: I have reviewed the patients History and Physical, chart, labs and discussed the procedure including the risks, benefits and alternatives for the proposed anesthesia with the patient or authorized representative who has indicated his/her understanding and acceptance.     Dental advisory given  Plan Discussed with: CRNA and Anesthesiologist  Anesthesia Plan Comments: (78 y.o. male with multiple myeloma on chemotherapy, dyslipidemia, and GERD.  Brought in by family for increasing lower extremity edema, perineal weeping sores, and  worsening generalized weakness for 1-2 weeks.)        Anesthesia Quick Evaluation

## 2023-10-05 NOTE — Plan of Care (Signed)
  Problem: Clinical Measurements: Goal: Ability to maintain clinical measurements within normal limits will improve Outcome: Progressing   Problem: Nutrition: Goal: Adequate nutrition will be maintained Outcome: Progressing   Problem: Pain Managment: Goal: General experience of comfort will improve and/or be controlled Outcome: Progressing   Problem: Respiratory: Goal: Ability to maintain a clear airway and adequate ventilation will improve Outcome: Progressing

## 2023-10-05 NOTE — Anesthesia Procedure Notes (Cosign Needed)
 Procedure Name: Intubation Date/Time: 10/05/2023 2:02 PM  Performed by: Randa Evens, CRNAPre-anesthesia Checklist: Patient identified, Emergency Drugs available, Suction available and Patient being monitored Patient Re-evaluated:Patient Re-evaluated prior to induction Oxygen Delivery Method: Circle System Utilized Preoxygenation: Pre-oxygenation with 100% oxygen Induction Type: IV induction Ventilation: Mask ventilation without difficulty Laryngoscope Size: Mac and 4 Grade View: Grade I Tube type: Oral Number of attempts: 1 Airway Equipment and Method: Stylet and Oral airway Placement Confirmation: ETT inserted through vocal cords under direct vision, positive ETCO2 and breath sounds checked- equal and bilateral Secured at: 23 cm Tube secured with: Tape Dental Injury: Teeth and Oropharynx as per pre-operative assessment

## 2023-10-05 NOTE — Progress Notes (Signed)
 NAME:  Derek [name], MRN:  [id number], DOB:  January 22, 1946, LOS: 9 ADMISSION DATE:  09/26/2023, CONSULTATION DATE: 09/27/2023 REFERRING MD: Loetta Rough, MD, CHIEF COMPLAINT: hypotension   History of Present Illness:  A 78 y.o. male with multiple myeloma on chemotherapy, dyslipidemia, and GERD.  Brought in by family for increasing lower extremity edema, perineal weeping sores, and worsening generalized weakness for 1-2 weeks.  At baseline patient relatively independent.  He is experience rapid decline in function recently.  Per patient report because he is difficult to move he has been left in his urine and feces.  Per wife the patient experiences pain and does not want to be moved. Smoker. In ED, he was found to be hypotensive and given 2.5 L LR, Vanco, Rocephin, Cefepime, Levophed @ 7 mcg/min now, Rx for hyperK, and HTS for hypoNa.    Pertinent  Medical History  Multiple myeloma on chemotherapy, dyslipidemia, GERD  Significant Hospital Events: Including procedures, antibiotic start and stop dates in addition to other pertinent events   2/11 admit, CCS consult 2/12 Surgical Procedure:  LAPAROSCOPIC LOOP COLOSTOMY  EXCISIONAL SHARP DEBRIDEMENT OF 24 CM TALL X 16 CM WIDE X 4 CM DEEP SACRAL WOUND USING A SCALPEL AND ELECTROCAUTERY \ Left on vent for Afib/shock state postop 2/13 extubated  2/15 on amiodarone, on Levophed 2/17 to OR for debridement, fourniers and nec fasc seen 2/19 return to OR planned. Hgb down to 6.8, 2u PRBC ordered.  Interim History / Subjective:  OR for re-look, packing removal, possible further debridement today. Hgb 6.8, 2u PRBC ordered. Remains on Amio. Heparin off this AM 0400 in anticipation of surgery.  Objective   Blood pressure (!) 164/56, pulse 60, temperature 99.6 F (37.6 C), temperature source Axillary, resp. rate 16, height 5\' 6"  (1.676 m), weight 119.3 kg, SpO2 94%.        Intake/Output Summary (Last 24 hours) at 10/05/2023 0740 Last data filed at  10/05/2023 0700 Gross per 24 hour  Intake 2822.82 ml  Output 3075 ml  Net -252.18 ml   Filed Weights   10/02/23 0500 10/04/23 0556 10/05/23 0500  Weight: 116.1 kg 120.2 kg 119.3 kg    Examination: (from camera, with assistance of RN) General: adult male, chronically ill appearing, resting in bed comfortably, in NAD. Neuro: A&O x 3, no deficits. HEENT: /AT. Sclerae anicteric. EOMI. Cardiovascular: IRIR, no M/R/G.  Lungs: Respirations even and unlabored.  CTA bilaterally, No W/R/R. Abdomen: Obese. Ostomy clean. BS x 4, soft, NT/ND.  Musculoskeletal: No gross deformities, 2+ edema/anasarca throughout.  Skin: Multiple sacral/perineal wounds (see images).   Assessment & Plan:   Septic shock secondary to large necrotic sacral ulcer s/p debridement - cultures with rare E.coli, rare Enterococcus faecalis, rare Pseudomonas aeruginosa, few bacteriodes thetaiotaomicron Diverting ostomy 09/28/2023 -Continue NE as needed for goal MAP > 65 - Midodrine added 2/17 - Continue Zosyn and Linezolid given OR findings - Surgery planning for repeat OR trip today 2/19 - Continue wound care, unsure that pt will be able to continue with this degree of wound care moving forward indefinitely. Wounds are significant and he has extremely high risk for further complications including osteo etc  Atrial fibrillation - Rate controlled on amiodarone - Continue Amiodarone - Heparin on hold this AM for surgery - Maintain K > 4, Mg > 2  Left femoral DVT - Continue Heparin, has been subtherapeutic so pharmacy working on increasing dose (? Resistance if continues to be subtherapeutic)  Anasarca with volume overload - +3L  net. - Hold lasix given pressor needs and OR trip - Maintain renal perfusion - Avoid nephrotoxic medications  Hypokalemia - s/p repletion Hypomagnesemia - has remained low despite repletion past few days Hypophosphatemia - 4g Mag today - Kphos - Follow BMP  ABLA - presumed acute  after wound debridement Thrombocytopenia - stable - 2u PRBC today prior to OR - Goal Hgb > 7 - Follow CBC  Wheelchair-bound at baseline Failure to thrive History of multiple myeloma on chemotherapy - Appreciate palliative care follow-up, currently opting for aggressive measures. I told pt 2/17 that if he has wounds requiring frequent debridement and ongoing sepsis with prolonged hospitalization, we may need to revisit goals of care discussions.   Best Practice (right click and "Reselect all SmartList Selections" daily)   Diet/type: Clear liquids - pt preference over regular diet (normally eats liquid foods) DVT prophylaxis: Heparin gtt on hold for OR Pressure ulcer(s): present on admission  LARGE wounds GI prophylaxis: oral ppi  Lines: Central line and Arterial Line Foley:  Yes, and it is still needed Code Status:  full code Last date of multidisciplinary goals of care discussion [palliative care medicine continues to follow]   CC time: 30 min.   Rutherford Guys, PA - [facility] For pager details, please see AMION or use Epic chat  After 1900, please call [facility] for cross coverage needs 10/05/2023, 7:40 AM

## 2023-10-05 NOTE — Progress Notes (Signed)
 1830: Post transfusion H&H resulted. Hemoglobin 9.0. Patient having serous/serosanguinous fluid from dressing on left side. Notified Hunsucker MD. Plan to continue to hold heparin and repeat H&H.

## 2023-10-05 NOTE — Progress Notes (Signed)
 [facility] ADULT ICU REPLACEMENT PROTOCOL   The patient does apply for the [facility] Adult ICU Electrolyte Replacment Protocol based on the criteria listed below:   1.Exclusion criteria: TCTS, ECMO, Dialysis, and Myasthenia Gravis patients 2. Is GFR >/= 30 ml/min? Yes.    Patient's GFR today is >60 3. Is SCr </= 2? Yes.   Patient's SCr is 0.76 mg/dL 4. Did SCr increase >/= 0.5 in 24 hours? No. 5.Pt's weight >40kg  Yes.   6. Abnormal electrolyte(s):   K 3.5  7. Electrolytes replaced per protocol 8.  Call MD STAT for K+ </= 2.5, Phos </= 1, or Mag </= 1 Physician:  Shawn Stall R Mayla Biddy 10/05/2023 5:48 AM

## 2023-10-05 NOTE — Progress Notes (Signed)
 PHARMACY - ANTICOAGULATION CONSULT NOTE  Pharmacy Consult for heparin  Indication: acute DVT  No Known Allergies  Patient Measurements: Height: 5\' 6"  (167.6 cm) Weight: 119.3 kg (263 lb) IBW/kg (Calculated) : 63.8 Heparin Dosing Weight: 88 kg. Weight has increased during admission, will use heparin dosing weight of 92 kg.  Vital Signs: Temp: 99.6 F (37.6 C) (02/19 0330) Temp Source: Axillary (02/19 0330) BP: 164/56 (02/18 2000) Pulse Rate: 60 (02/19 0645)  Labs: Recent Labs    10/03/23 0013 10/03/23 1049 10/03/23 1510 10/04/23 0450 10/04/23 1149 10/04/23 2337 10/05/23 0504  HGB 9.3*  --   --  8.3*  --   --  6.8*  HCT 29.0*  --   --  25.0*  --   --  20.5*  PLT 81*  --   --  109*  --   --  146*  HEPARINUNFRC <0.10* <0.10*  --   --  <0.10* 0.18*  --   CREATININE 1.14  --  1.07 1.14  --   --  0.76    Estimated Creatinine Clearance: 95.6 mL/min (by C-G formula based on SCr of 0.76 mg/dL).   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 male with limited mobility (wheelchair- bound), multiple myeloma, and sacral wound who presented to the ED on 09/26/2023 with c/o generalized weakness, worsening of LE edema and weeping leg wounds.  LE doppler on 09/27/23 showed acute deep vein thrombosis involving the left  femoral vein. Anticoag. held off at the time due to low blood count.   Pharmacy was consulted to initiate heparin drip on 2/15 with instructions from Dr. Wynona Neat for no bolus and aim for lower heparin level goal range.  Significant events:  - 2/11: 1 unit PRBC - 2/12: 2 unit PRBC - 2/12:  I&D of sacral wound with loop colostomy.  - 2/17: I&D of sacral wound. UFH held, resumed 8 hours post-op per CCS instructions - 2/19: Scheduled return to OR for  exploration  Discussed persistently subtherapeutic heparin level with CCM on 2/18. Plan to continue UFH given planned return to OR.  Today, 10/05/2023: Heparin drip stopped this morning @ 04:31 (6 hours prior to surgery initially scheduled for 10:45. OR time pushed back to 13:55) CBC: Hgb dropped to 6.8 - transfusing 2u PRBC; Plt continue to trend up No bleeding reported  Goal of Therapy:  Heparin level 0.3-0.5 units/ml Monitor platelets by anticoagulation protocol: Yes   Plan: Discussed with CCM - holding anticoagulation for now. Pharmacy to sign off. Please re-consult if needed.  Cindi Carbon, PharmD 10/05/23 7:49 AM
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
## 2023-10-06 ENCOUNTER — Encounter (HOSPITAL_COMMUNITY): Payer: Self-pay | Admitting: Surgery

## 2023-10-06 DIAGNOSIS — M726 Necrotizing fasciitis: Secondary | ICD-10-CM | POA: Diagnosis not present

## 2023-10-06 DIAGNOSIS — I4891 Unspecified atrial fibrillation: Secondary | ICD-10-CM | POA: Diagnosis not present

## 2023-10-06 DIAGNOSIS — A419 Sepsis, unspecified organism: Secondary | ICD-10-CM | POA: Diagnosis not present

## 2023-10-06 DIAGNOSIS — E871 Hypo-osmolality and hyponatremia: Secondary | ICD-10-CM | POA: Diagnosis not present

## 2023-10-06 LAB — TYPE AND SCREEN
ABO/RH(D): A POS
Antibody Screen: POSITIVE
DAT, IgG: NEGATIVE
Unit division: 0
Unit division: 0

## 2023-10-06 LAB — BPAM RBC
Blood Product Expiration Date: 202503212359
Blood Product Expiration Date: 202503212359
ISSUE DATE / TIME: 202502191309
ISSUE DATE / TIME: 202502191438
Unit Type and Rh: 6200
Unit Type and Rh: 6200

## 2023-10-06 LAB — BASIC METABOLIC PANEL
Anion gap: 8 (ref 5–15)
BUN: 16 mg/dL (ref 8–23)
CO2: 22 mmol/L (ref 22–32)
Calcium: 7.7 mg/dL — ABNORMAL LOW (ref 8.9–10.3)
Chloride: 101 mmol/L (ref 98–111)
Creatinine, Ser: 0.77 mg/dL (ref 0.61–1.24)
GFR, Estimated: >60 mL/min (ref >60)
Glucose, Bld: 129 mg/dL — ABNORMAL HIGH (ref 70–99)
Potassium: 3.6 mmol/L (ref 3.5–5.1)
Sodium: 131 mmol/L — ABNORMAL LOW (ref 135–145)

## 2023-10-06 LAB — MAGNESIUM: Magnesium: 1.6 mg/dL — ABNORMAL LOW (ref 1.7–2.4)

## 2023-10-06 LAB — GLUCOSE, CAPILLARY
Glucose-Capillary: 124 mg/dL — ABNORMAL HIGH (ref 70–99)
Glucose-Capillary: 181 mg/dL — ABNORMAL HIGH (ref 70–99)
Glucose-Capillary: 165 mg/dL — ABNORMAL HIGH (ref 70–99)
Glucose-Capillary: 132 mg/dL — ABNORMAL HIGH (ref 70–99)

## 2023-10-06 LAB — CBC
HCT: 26.5 % — ABNORMAL LOW (ref 39.0–52.0)
Hemoglobin: 8.7 g/dL — ABNORMAL LOW (ref 13.0–17.0)
MCH: 31.4 pg (ref 26.0–34.0)
MCHC: 32.8 g/dL (ref 30.0–36.0)
MCV: 95.7 fL (ref 80.0–100.0)
Platelets: 191 10*3/uL (ref 150–400)
RBC: 2.77 MIL/uL — ABNORMAL LOW (ref 4.22–5.81)
RDW: 16 % — ABNORMAL HIGH (ref 11.5–15.5)
WBC: 2.4 10*3/uL — ABNORMAL LOW (ref 4.0–10.5)
nRBC: 0 % (ref 0.0–0.2)

## 2023-10-06 LAB — PHOSPHORUS: Phosphorus: 2.6 mg/dL (ref 2.5–4.6)

## 2023-10-06 MED ORDER — ZINC SULFATE 220 (50 ZN) MG PO CAPS
220.0000 mg | ORAL_CAPSULE | Freq: Every day | ORAL | Status: DC
  Administered 2023-10-06 – 2023-10-17 (×11): 220 mg via ORAL
  Filled 2023-10-06 (×12): qty 1

## 2023-10-06 MED ORDER — HEPARIN (PORCINE) 25000 UT/250ML-% IV SOLN
2800.0000 [IU]/h | INTRAVENOUS | Status: AC
  Administered 2023-10-06: 2500 [IU]/h via INTRAVENOUS
  Administered 2023-10-07: 2800 [IU]/h via INTRAVENOUS
  Administered 2023-10-07: 2500 [IU]/h via INTRAVENOUS
  Filled 2023-10-06 (×3): qty 250

## 2023-10-06 MED ORDER — POTASSIUM CHLORIDE 10 MEQ/50ML IV SOLN
10.0000 meq | INTRAVENOUS | Status: AC
  Administered 2023-10-06 (×4): 10 meq via INTRAVENOUS
  Filled 2023-10-06 (×4): qty 50

## 2023-10-06 MED ORDER — MAGNESIUM SULFATE 4 GM/100ML IV SOLN
4.0000 g | Freq: Once | INTRAVENOUS | Status: AC
  Administered 2023-10-06: 4 g via INTRAVENOUS
  Filled 2023-10-06: qty 100

## 2023-10-06 MED ORDER — VITAMIN C 500 MG PO TABS
500.0000 mg | ORAL_TABLET | Freq: Two times a day (BID) | ORAL | Status: DC
  Administered 2023-10-06 – 2023-10-17 (×21): 500 mg via ORAL
  Filled 2023-10-06 (×24): qty 1
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
## 2023-10-06 NOTE — Progress Notes (Signed)
 Patient refused to eat dinner. Patient also asked me to call his wife. I called once with no answer, voicemail left.

## 2023-10-06 NOTE — Progress Notes (Signed)
 1 Day Post-Op  Subjective: CC: Tolerating fld without n/v. Having ostomy output. Foley with good uop, 0.6 ml/kg/hr over the last 24 hours.  Febrile to 100.4 over the last 24 hours. Afebrile on last check.  Remains on levo, 58mcg/min  Objective: Vital signs in last 24 hours: Temp:  [97.6 F (36.4 C)-100.4 F (38 C)] 98.7 F (37.1 C) (02/20 0832) Pulse Rate:  [44-80] 72 (02/20 0645) Resp:  [11-24] 12 (02/20 0645) BP: (159-167)/(75-80) 166/80 (02/19 1600) SpO2:  [92 %-100 %] 93 % (02/20 0800) Arterial Line BP: (108-166)/(44-80) 149/52 (02/20 1202) Weight:  [118.8 kg] 118.8 kg (02/20 0438) Last BM Date : 10/05/23  Intake/Output from previous day: 02/19 0701 - 02/20 0700 In: 4533.2 [P.O.:420; I.V.:1602.4; Blood:1396; IV Piggyback:1114.8] Out: 1975 [Urine:1600; Stool:275; Blood:100] Intake/Output this shift: Total I/O In: 970.8 [I.V.:102.8; Blood:350; IV Piggyback:518] Out: 700 [Urine:650; Stool:50]  PE: Gen:  Alert, NAD, pleasant Abd: Soft, ND, NT,  Ext:  No LE edema or calf tenderness Abd: Soft, ND, NT, stoma viable with liquid stool in ostomy bag. Laparoscopic incisions cdi Wound as below.   Left buttock - tracks 10cm towards left hip. Mixture of granulation, adipose and fibrinous tissue. No drainage.    Wound with Mixture of granulation, adipose and fibrinous tissue. No drainage.  Wound at the superior aspect of gluteal cleft tracks 6cm superiorly/R lateral   Left hip with epiderimlysis    Left hip/thigh - Incisions with mixture of adipose and granulation tissue. Superior wound tracks 10cm medially. No drainage. Inferior incision tracks 8cm superiorly. No drainage. Erythema of abdomen and thigh marked.    There is also a incision on the R buttock   Lab Results:  Recent Labs    10/05/23 0504 10/05/23 1813 10/06/23 0434  WBC 3.9*  --  2.4*  HGB 6.8* 9.0* 8.7*  HCT 20.5* 27.2* 26.5*  PLT 146*  --  191   BMET Recent Labs    10/05/23 0504 10/06/23 0434   NA 134* 131*  K 3.5 3.6  CL 104 101  CO2 21* 22  GLUCOSE 149* 129*  BUN 19 16  CREATININE 0.76 0.77  CALCIUM 7.8* 7.7*   PT/INR No results for input(s): "LABPROT", "INR" in the last 72 hours. CMP     Component Value Date/Time   NA 131 (L) 10/06/2023 0434   NA 138 08/27/2021 1028   K 3.6 10/06/2023 0434   CL 101 10/06/2023 0434   CO2 22 10/06/2023 0434   GLUCOSE 129 (H) 10/06/2023 0434   BUN 16 10/06/2023 0434   BUN 14 08/27/2021 1028   CREATININE 0.77 10/06/2023 0434   CALCIUM 7.7 (L) 10/06/2023 0434   PROT 6.3 (L) 09/26/2023 1706   PROT 8.2 08/27/2021 1028   ALBUMIN 2.5 (L) 09/26/2023 1706   ALBUMIN 3.7 08/27/2021 1028   AST 23 09/26/2023 1706   ALT 50 (H) 09/26/2023 1706   ALKPHOS 104 09/26/2023 1706   BILITOT 1.4 (H) 09/26/2023 1706   BILITOT 0.4 08/27/2021 1028   GFRNONAA >60 10/06/2023 0434   GFRAA 59 (L) 11/25/2016 1704   Lipase  No results found for: "LIPASE"  Studies/Results: No results found.  Anti-infectives: Anti-infectives (From admission, onward)    Start     Dose/Rate Route Frequency Ordered Stop   10/04/23 0900  linezolid (ZYVOX) IVPB 600 mg        600 mg 300 mL/hr over 60 Minutes Intravenous Every 12 hours 10/04/23 0809     10/04/23 0830  clindamycin (CLEOCIN)  IVPB 900 mg  Status:  Discontinued        900 mg 100 mL/hr over 30 Minutes Intravenous Every 6 hours 10/04/23 0730 10/04/23 0809   09/27/23 1100  piperacillin-tazobactam (ZOSYN) IVPB 3.375 g        3.375 g 12.5 mL/hr over 240 Minutes Intravenous Every 8 hours 09/27/23 1052     09/27/23 0600  ceFEPIme (MAXIPIME) 2 g in sodium chloride 0.9 % 100 mL IVPB  Status:  Discontinued        2 g 200 mL/hr over 30 Minutes Intravenous Every 24 hours 09/27/23 0021 09/27/23 1025   09/27/23 0030  vancomycin (VANCOREADY) IVPB 2000 mg/400 mL        2,000 mg 200 mL/hr over 120 Minutes Intravenous  Once 09/27/23 0021 09/27/23 0333   09/27/23 0021  vancomycin variable dose per unstable renal function  (pharmacist dosing)  Status:  Discontinued         Does not apply See admin instructions 09/27/23 0021 09/27/23 0416   09/26/23 2200  cefTRIAXone (ROCEPHIN) 2 g in sodium chloride 0.9 % 100 mL IVPB        2 g 200 mL/hr over 30 Minutes Intravenous  Once 09/26/23 2148 09/26/23 2323        Assessment/Plan POD 8 s/p laparoscopic loop colostomy, excisional sharp debridement of sacral wound by Dr. Dossie Der on 09/28/23 POD 3/1 s/p excisional debridement of back, sacrum, hips and thighs - Tmax 100.4. WBC 2.4.  Remains on 44mcg/min of levo - WOCN consult for new ostomy. Ostomy bridge removed POD5 - Cont BID WTD dressings to wound - Cont abx. Cx with E. Coli, Enterococcus faecalis, Pseudomonas. - Appreciate CCM assistance    FEN - FLD. Would be hesistant to adv diet while still on pressors. IVF per primary  VTE - SCD's, okay for heparin gtt with no bolus  ID - Linezolid and Zosyn. Defer selection to primary  Foley - okay to remove from our standpoint   Anemia - s/p 1U 2/11, 2U 2/12, 2U 2/19. Hgb 8.7 Aspiration PNA L femoral DVT - newly diagnosed this admission Multiple myeloma on lenalidomide  Wheelchair bound HLD GERD   LOS: 10 days    Jacinto Halim, [facility] 10/06/2023, 1:32 PM Please see Amion for pager number during day hours 7:00am-4:30pm

## 2023-10-06 NOTE — Consult Note (Signed)
 WOC ostomy follow-up: Surgical team following for assessment and plan of care for sacral and buttock wounds.  No family present during pouch change; Pt is in ICU and watched the procedure but did not participate. Stoma is red and viable, 2 1/4 inches and edematous, slightly above skin level.  Applied barrier ring and 2 piece pouching system. 4 extra supplies left at the bedside. Use Supplies: barrier ring Hart [place] # [id number] and wafer Hart [place] # 2 and pouch Lawson # [id number] Emptied 50cc liquid brown stool.  Enrolled patient in [place] Secure Start Discharge program: Yes; previously Pt and family will need further teaching sessions when he is stable and out of ICU. Thank-you,  Cammie Mcgee MSN, RN, CWOCN, [place], CNS [phone]

## 2023-10-06 NOTE — Progress Notes (Signed)
 NAME:  Sara Selvidge, MRN:  [id number], DOB:  1946-05-20, LOS: 10 ADMISSION DATE:  09/26/2023, CONSULTATION DATE: 09/27/2023 REFERRING MD: Loetta Rough, MD, CHIEF COMPLAINT: hypotension   History of Present Illness:  A 78 y.o. male with multiple myeloma on chemotherapy, dyslipidemia, and GERD.  Brought in by family for increasing lower extremity edema, perineal weeping sores, and worsening generalized weakness for 1-2 weeks.  At baseline patient relatively independent.  He is experience rapid decline in function recently.  Per patient report because he is difficult to move he has been left in his urine and feces.  Per wife the patient experiences pain and does not want to be moved. Smoker. In ED, he was found to be hypotensive and given 2.5 L LR, Vanco, Rocephin, Cefepime, Levophed @ 7 mcg/min now, Rx for hyperK, and HTS for hypoNa.    Pertinent  Medical History  Multiple myeloma on chemotherapy, dyslipidemia, GERD  Significant Hospital Events: Including procedures, antibiotic start and stop dates in addition to other pertinent events   2/11 admit, CCS consult 2/12 Surgical Procedure:  LAPAROSCOPIC LOOP COLOSTOMY  EXCISIONAL SHARP DEBRIDEMENT OF 24 CM TALL X 16 CM WIDE X 4 CM DEEP SACRAL WOUND USING A SCALPEL AND ELECTROCAUTERY \ Left on vent for Afib/shock state postop 2/13 extubated  2/15 on amiodarone, on Levophed 2/17 to OR for debridement, fourniers and nec fasc seen 2/19 return to OR for debridement . Hgb down to 6.8, 2u PRBC  Interim History / Subjective:  No complaints No acute events overnight On pressors, but is hypertensive.  2.5L positive for the admission.  Tmax 100.4  Objective   Blood pressure (!) 166/80, pulse 72, temperature 97.9 F (36.6 C), temperature source Oral, resp. rate 12, height 5\' 6"  (1.676 m), weight 118.8 kg, SpO2 93%.        Intake/Output Summary (Last 24 hours) at 10/06/2023 0822 Last data filed at 10/06/2023 0800 Gross per 24 hour  Intake  4883.17 ml  Output 1975 ml  Net 2908.17 ml   Filed Weights   10/04/23 0556 10/05/23 0500 10/06/23 0438  Weight: 120.2 kg 119.3 kg 118.8 kg    Examination: General: elderly overweight male in NAD. Resting comfortably Neuro: Awake, alert, oriented, non-focal HEENT: [place]/AT, PERRL, no JVD Cardiovascular: IRIR, rate controlled, no MRG Lungs: Clear bilateral breath sounds Abdomen: Soft, NT, ND. Stoma wnl with liquid stool.  Musculoskeletal: Observed surgical dressings in place with minimal strikethrough Skin: Multiple sacral/perineal wounds (see images).   Na 131, K 3.6, mg 1.6, Creat 0.77 WBC 2.4, Hgb 8.7, plt 191 CBG 120-180 range  Assessment & Plan:   Septic shock secondary to large necrotic sacral ulcer s/p debridement - cultures with rare E.coli, rare Enterococcus faecalis, rare Pseudomonas aeruginosa, few bacteriodes thetaiotaomicron Diverting ostomy 09/28/2023 - NE, Vaso for MAP 65, should be able to wean today - Midodrine continue - Zosyn, linezolid will need to determine stop date. - Surgery is hopeful he will not require any further intervention > plan for bedside dressing changes from here on out.   Atrial fibrillation - Rate controlled on amiodarone - Continue Amiodarone - heparin held last night due to post op wound drainage. Will plan to restart this morning.  - Maintain K > 4, Mg > 2  Left femoral DVT - Restart Heparin, has been subtherapeutic so pharmacy working on increasing dose   Anasarca with volume overload - Will see how he does weaning pressors before considering diuresis.  - Maintain renal perfusion - Avoid nephrotoxic  medications  Hypokalemia - s/p repletion Hypomagnesemia - has remained low despite repletion past few days Hypophosphatemia - 4g mag again today - Replete K - Follow BMP  ABLA - presumed acute after wound debridement Thrombocytopenia - stable - Goal Hgb > 7 - Follow CBC  Wheelchair-bound at baseline Failure to thrive History  of multiple myeloma on chemotherapy - Appreciate palliative care follow-up, currently opting for aggressive measures.   Best Practice (right click and "Reselect all SmartList Selections" daily)   Diet/type: Clear liquids - pt preference over regular diet (normally eats liquid foods) DVT prophylaxis: Heparin gtt on hold for OR Pressure ulcer(s): present on admission  LARGE wounds GI prophylaxis: oral ppi  Lines: Central line and Arterial Line Foley:  Yes, and it is still needed Code Status:  full code Last date of multidisciplinary goals of care discussion [palliative care medicine continues to follow]   CC time:  39 min.    Joneen Roach, AGACNP-BC [facility]  See Amion for personal pager PCCM on call pager [phone] until 7pm. Please call Elink 7p-7a. [phone]  10/06/2023 8:25 AM

## 2023-10-06 NOTE — Plan of Care (Signed)
  Problem: Clinical Measurements: Goal: Ability to maintain clinical measurements within normal limits will improve Outcome: Progressing   Problem: Activity: Goal: Risk for activity intolerance will decrease Outcome: Progressing   Problem: Nutrition: Goal: Adequate nutrition will be maintained Outcome: Progressing   Problem: Coping: Goal: Level of anxiety will decrease Outcome: Progressing   

## 2023-10-06 NOTE — Progress Notes (Signed)
 Nutrition Follow-up  DOCUMENTATION CODES:   Obesity unspecified  INTERVENTION:  - Advance to a Full liquid diet to allow more options. Discussed with CCM and CCS.  - Would recommend advancing diet once medically appropriate/as tolerated due to increased nutrient needs for healing large wounds.  - Assistance with ordering meals added to [employer].  - Ensure Plus High Protein po TID, each supplement provides 350 kcal and 20 grams of protein. - Once sepsis resolved, recommend 1 packet Juven BID, each packet provides 95 calories, 2.5 grams of protein (collagen).  - Add Multivitamin with minerals daily, 500mg  vitamin C, and 220mg  zinc x14 days.   - Monitor weight trends.   - Will monitor intake closely. If intake is poor or patient unable to eat more than just clear liquids, would recommend consideration of Cortrak to aid in meeting increased nutrient needs for wound healing.    NUTRITION DIAGNOSIS:   Increased nutrient needs related to cancer and cancer related treatments, wound healing as evidenced by estimated needs. *ongoing  GOAL:   Patient will meet greater than or equal to 90% of their needs *not met  MONITOR:   Diet advancement, Weight trends  REASON FOR ASSESSMENT:   Consult Assessment of nutrition requirement/status  ASSESSMENT:   78 y.o. male with PMH multiple myeloma on chemotherapy, dyslipidemia, and GERD who presented for increasing lower extremity edema, perineal weeping sores, and worsening generalized weakness for 1-2 weeks.  2/11 Admit; Heart diet 212 s/p colostomy, debridement of sacral wound; returned to ICU intubated 2/13 Extubated; CLD 2/17 back to OR for debridement, found to have fournier's gangrene and necrotizing fascitis 2/18 Heart diet 2/19 back to OR for another debridement; CLD  RD working remotely. Unable to speak with patient via bedside telephone.    Patient is documented to be consuming 75-100% of meals but these have all been clear  liquid diet meals. He has been receiving Ensure and appears to be consuming. Per CCM note and discussion with NP, patient remains on clear liquids as he prefers this. RN also notes patient had reported to night nurse he mostly drinks clear liquids and eats soft food at home. Of note, per my discussion with patient's wife on 2/13 she did not report this.   Very concerned patient will not be able to be close to meeting nutritional needs for healing on a clear liquid diet. Discussed with CCM NP, RN, and CCS PA. Will plan to advance patient to a full liquid diet for and now and see how he does. CCS recommending to keep patient on a full liquid diet for now as he remains on pressors. Will add assistance with ordering to allow patient to order meals he desires/feels okay with eating. Will continue Ensure and add wound healing vitamins/minerals.  Would recommend recommend advancing to a soft diet once medically appropriate to provide a wider variety of options with more protein.   Will need to monitor intake/food choices closely. Patient has a substantial wound in need of healing and remain very concerned he will be able to eat well enough to promote healing.  Discussed with CCM NP. Will monitor intake for a few days and if poor/only eating clear liquids, recommend Cortrak to aid in meeting nutritional needs.   Admit weight: 240# Current weight: 262# I&O's: +6L since admit  Medications reviewed and include: Colace, Miralax, MVI Levophed @ 3 mcg/min Vasopressin # 0.03 units/min  Labs reviewed:  Na 131 Magnesium 1.6   Diet Order:   Diet Order  Diet clear liquid Room service appropriate? Yes; Fluid consistency: Thin  Diet effective midnight                   EDUCATION NEEDS:  Not appropriate for education at this time  Skin:  Skin Assessment: Skin Integrity Issues: Skin Integrity Issues:: Other (Comment), Unstageable, DTI, Incisions DTI: Left Heel Unstageable:  Sacrum Incisions: Abdomen Other: Venous stasis ulcer on L and R tibial  Last BM:  2/19 - type 7 - colostomy  Height:  Ht Readings from Last 1 Encounters:  09/26/23 5\' 6"  (1.676 m)   Weight:  Wt Readings from Last 1 Encounters:  10/06/23 118.8 kg   Ideal Body Weight:  64.55 kg  BMI:  Body mass index is 42.29 kg/m.  Estimated Nutritional Needs:  Kcal:  2100-2300 kcals Protein:  110-130 grams Fluid:  >/= 2.1L    Shelle Iron RD, LDN Contact via Secure Chat.

## 2023-10-06 NOTE — Progress Notes (Signed)
 [facility] ADULT ICU REPLACEMENT PROTOCOL   The patient does apply for the [facility] Adult ICU Electrolyte Replacment Protocol based on the criteria listed below:   1.Exclusion criteria: TCTS, ECMO, Dialysis, and Myasthenia Gravis patients 2. Is GFR >/= 30 ml/min? Yes.    Patient's GFR today is >60 3. Is SCr </= 2? Yes.   Patient's SCr is 0.77 mg/dL 4. Did SCr increase >/= 0.5 in 24 hours? No. 5.Pt's weight >40kg  Yes.   6. Abnormal electrolyte(s): mag 1.6, potassium 3.6  7. Electrolytes replaced per protocol 8.  Call MD STAT for K+ </= 2.5, Phos </= 1, or Mag </= 1 Physician:  protocol  Melvern Banker 10/06/2023 6:15 AM

## 2023-10-07 DIAGNOSIS — M726 Necrotizing fasciitis: Secondary | ICD-10-CM | POA: Diagnosis not present

## 2023-10-07 DIAGNOSIS — A419 Sepsis, unspecified organism: Secondary | ICD-10-CM | POA: Diagnosis not present

## 2023-10-07 DIAGNOSIS — I82412 Acute embolism and thrombosis of left femoral vein: Secondary | ICD-10-CM

## 2023-10-07 DIAGNOSIS — R6521 Severe sepsis with septic shock: Secondary | ICD-10-CM | POA: Diagnosis not present

## 2023-10-07 DIAGNOSIS — I4891 Unspecified atrial fibrillation: Secondary | ICD-10-CM | POA: Diagnosis not present

## 2023-10-07 LAB — BASIC METABOLIC PANEL
Anion gap: 11 (ref 5–15)
BUN: 15 mg/dL (ref 8–23)
CO2: 24 mmol/L (ref 22–32)
Calcium: 7.6 mg/dL — ABNORMAL LOW (ref 8.9–10.3)
Chloride: 96 mmol/L — ABNORMAL LOW (ref 98–111)
Creatinine, Ser: 0.69 mg/dL (ref 0.61–1.24)
GFR, Estimated: >60 mL/min (ref >60)
Glucose, Bld: 133 mg/dL — ABNORMAL HIGH (ref 70–99)
Potassium: 3.5 mmol/L (ref 3.5–5.1)
Sodium: 131 mmol/L — ABNORMAL LOW (ref 135–145)

## 2023-10-07 LAB — GLUCOSE, CAPILLARY
Glucose-Capillary: 129 mg/dL — ABNORMAL HIGH (ref 70–99)
Glucose-Capillary: 197 mg/dL — ABNORMAL HIGH (ref 70–99)
Glucose-Capillary: 98 mg/dL (ref 70–99)
Glucose-Capillary: 98 mg/dL (ref 70–99)

## 2023-10-07 LAB — CBC
HCT: 23.6 % — ABNORMAL LOW (ref 39.0–52.0)
Hemoglobin: 7.8 g/dL — ABNORMAL LOW (ref 13.0–17.0)
MCH: 31.1 pg (ref 26.0–34.0)
MCHC: 33.1 g/dL (ref 30.0–36.0)
MCV: 94 fL (ref 80.0–100.0)
Platelets: 247 10*3/uL (ref 150–400)
RBC: 2.51 MIL/uL — ABNORMAL LOW (ref 4.22–5.81)
RDW: 15.9 % — ABNORMAL HIGH (ref 11.5–15.5)
WBC: 3.9 10*3/uL — ABNORMAL LOW (ref 4.0–10.5)
nRBC: 0 % (ref 0.0–0.2)

## 2023-10-07 LAB — HEPARIN LEVEL (UNFRACTIONATED): Heparin Unfractionated: <0.1 [IU]/mL — ABNORMAL LOW (ref 0.30–0.70)

## 2023-10-07 LAB — MAGNESIUM: Magnesium: 1.7 mg/dL (ref 1.7–2.4)

## 2023-10-07 LAB — PHOSPHORUS: Phosphorus: 1.7 mg/dL — ABNORMAL LOW (ref 2.5–4.6)

## 2023-10-07 MED ORDER — PHENOL 1.4 % MT LIQD
2.0000 | OROMUCOSAL | Status: DC | PRN
Start: 2023-10-07 — End: 2023-10-18

## 2023-10-07 MED ORDER — ENOXAPARIN SODIUM 120 MG/0.8ML IJ SOSY
1.0000 mg/kg | PREFILLED_SYRINGE | Freq: Two times a day (BID) | INTRAMUSCULAR | Status: DC
  Administered 2023-10-07 – 2023-10-14 (×13): 120 mg via SUBCUTANEOUS
  Filled 2023-10-07 (×14): qty 0.8

## 2023-10-07 MED ORDER — CALCIUM POLYCARBOPHIL 625 MG PO TABS
625.0000 mg | ORAL_TABLET | Freq: Two times a day (BID) | ORAL | Status: DC
  Administered 2023-10-07 – 2023-10-17 (×16): 625 mg via ORAL
  Filled 2023-10-07 (×17): qty 1

## 2023-10-07 MED ORDER — MAGNESIUM SULFATE 2 GM/50ML IV SOLN
2.0000 g | Freq: Once | INTRAVENOUS | Status: AC
  Administered 2023-10-07: 2 g via INTRAVENOUS
  Filled 2023-10-07: qty 50

## 2023-10-07 MED ORDER — ACETAMINOPHEN 500 MG PO TABS
1000.0000 mg | ORAL_TABLET | Freq: Four times a day (QID) | ORAL | Status: DC
  Administered 2023-10-07 – 2023-10-17 (×37): 1000 mg via ORAL
  Filled 2023-10-07 (×36): qty 2

## 2023-10-07 MED ORDER — ALUM & MAG HYDROXIDE-SIMETH 200-200-20 MG/5ML PO SUSP: 30.0000 mL | Freq: Four times a day (QID) | ORAL | Status: DC | PRN

## 2023-10-07 MED ORDER — OXYCODONE HCL 5 MG PO TABS
5.0000 mg | ORAL_TABLET | ORAL | Status: DC | PRN
  Administered 2023-10-07 – 2023-10-09 (×3): 5 mg via ORAL
  Administered 2023-10-09: 10 mg via ORAL
  Administered 2023-10-10: 5 mg via ORAL
  Administered 2023-10-10: 10 mg via ORAL
  Administered 2023-10-15: 5 mg via ORAL
  Filled 2023-10-07 (×2): qty 1
  Filled 2023-10-07: qty 2
  Filled 2023-10-07: qty 1
  Filled 2023-10-07: qty 2
  Filled 2023-10-07: qty 1
  Filled 2023-10-07 (×2): qty 2

## 2023-10-07 MED ORDER — METHOCARBAMOL 500 MG PO TABS
1000.0000 mg | ORAL_TABLET | Freq: Four times a day (QID) | ORAL | Status: DC | PRN
  Administered 2023-10-08 – 2023-10-09 (×3): 1000 mg via ORAL
  Filled 2023-10-07 (×3): qty 2

## 2023-10-07 MED ORDER — POLYETHYLENE GLYCOL 3350 17 G PO PACK: 17.0000 g | PACK | Freq: Two times a day (BID) | ORAL | Status: DC | PRN

## 2023-10-07 MED ORDER — OXYCODONE-ACETAMINOPHEN 5-325 MG PO TABS: 1.0000 | ORAL_TABLET | ORAL | Status: DC | PRN

## 2023-10-07 MED ORDER — SIMETHICONE 40 MG/0.6ML PO SUSP
80.0000 mg | Freq: Four times a day (QID) | ORAL | Status: DC | PRN
Start: 2023-10-07 — End: 2023-10-07

## 2023-10-07 MED ORDER — POTASSIUM PHOSPHATES 15 MMOLE/5ML IV SOLN
30.0000 mmol | Freq: Once | INTRAVENOUS | Status: AC
  Administered 2023-10-07: 30 mmol via INTRAVENOUS
  Filled 2023-10-07: qty 10

## 2023-10-07 NOTE — Progress Notes (Signed)
 PHARMACY - ANTICOAGULATION CONSULT NOTE  Pharmacy Consult for heparin  Indication: acute DVT  No Known Allergies  Patient Measurements: Height: 5\' 6"  (167.6 cm) Weight: 118.8 kg (262 lb) IBW/kg (Calculated) : 63.8 Heparin Dosing Weight: 92 kg  Vital Signs: Temp: 98.1 F (36.7 C) (02/21 0100) Temp Source: Oral (02/21 0100) BP: 137/46 (02/20 2200) Pulse Rate: 72 (02/20 2245)  Labs: Recent Labs    10/04/23 0450 10/04/23 1149 10/04/23 2337 10/05/23 0504 10/05/23 1813 10/06/23 0434 10/07/23 0329  HGB 8.3*  --   --  6.8* 9.0* 8.7* 7.8*  HCT 25.0*  --   --  20.5* 27.2* 26.5* 23.6*  PLT 109*  --   --  146*  --  191 247  HEPARINUNFRC  --  <0.10* 0.18*  --   --   --  <0.10*  CREATININE 1.14  --   --  0.76  --  0.77  --     Estimated Creatinine Clearance: 95.3 mL/min (by C-G formula based on SCr of 0.77 mg/dL).   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 male with limited mobility (wheelchair- bound), multiple myeloma, and sacral wound who presented to the ED on 09/26/2023 with c/o generalized weakness, worsening of LE edema and weeping leg wounds.  LE doppler on 09/27/23 showed acute deep vein thrombosis involving the left  femoral vein. Anticoagulation held at the time due to low blood count.   Pharmacy was consulted to initiate heparin drip on 2/15 with instructions from Dr. Wynona Neat for no bolus and aim for lower heparin level goal range.  Significant events:  - 2/11: 1 unit PRBC -2/12: 2 unit PRBC -2/12:  I&D of sacral wound with loop colostomy.  -2/17: I&D of sacral wound. UFH held, resumed 8 hours post-op per CCS instructions -2/19: Re-debridement of back, sacrum, bilateral hips, bilateral thighs; 2 unit PRBC   Today, 10/07/2023: -Heparin level <  0.1 subtherapeutic on 2500 units/hr Hgb 7.8, plts 247 Per RN no interruptions and no bleeding  Goal of Therapy:  Heparin level 0.3-0.5 units/ml Monitor platelets by anticoagulation protocol: Yes   Plan:  -increase heparin drip to 2800 units/hr -Check 8 hour heparin level -Daily CBC -Follow for ability to transition to Lovenox or oral anticoagulation as appropriate   Arley Phenix RPh 10/07/2023, 4:27 AM

## 2023-10-07 NOTE — Progress Notes (Signed)
 [facility] ADULT ICU REPLACEMENT PROTOCOL   The patient does apply for the [facility] Adult ICU Electrolyte Replacment Protocol based on the criteria listed below:   1.Exclusion criteria: TCTS, ECMO, Dialysis, and Myasthenia Gravis patients 2. Is GFR >/= 30 ml/min? Yes.    Patient's GFR today is >60 3. Is SCr </= 2? Yes.   Patient's SCr is 0.69 mg/dL 4. Did SCr increase >/= 0.5 in 24 hours? No. 5.Pt's weight >40kg  Yes.   6. Abnormal electrolyte(s): potassium 3.5, phos 1.7, mag 1.7  7. Electrolytes replaced per protocol 8.  Call MD STAT for K+ </= 2.5, Phos </= 1, or Mag </= 1 Physician:  protocol  Melvern Banker 10/07/2023 6:37 AM

## 2023-10-07 NOTE — Progress Notes (Signed)
 10/07/2023  Derek [name] [id number] 09/20/1945  CARE TEAM: PCP: [facility]  Outpatient Care Team: Patient Care Team: [facility] as PCP - General Bedelia Person, MD as Consulting Physician (Neurosurgery) Randa Spike, [name], LCSW as [facility] Care Management (Licensed Clinical Social Worker) Dettinger, Elige Radon, MD as Consulting Physician (Family Medicine)  Inpatient Treatment Team: Treatment Team:  Hunsucker, Lesia Sago, MD Pccm, Md, MD Ccs, Md, MD Bullins, Benard Halsted, RN Apickup-Ot, A, OT Rosalin Hawking, MD Randel Books, RN Black, Christie Beckers, NT Winslow, RN Pollet, Meda Klinefelter, RN Swayne, Jodelle Red, [place] Nathanial Rancher, RN   Problem List:   Principal Problem:   Necrotizing fasciitis of multiple sites [facility]) Active Problems:   Hyponatremia   Decubitus ulcer of sacral region   Hyperkalemia   AKI (acute kidney injury) (HCC)   Metabolic acidosis   Anorexia   Septic shock (HCC)   Palliative care encounter   Peripheral edema   Pressure injury of contiguous region involving back, buttock, and hip, stage 2 (HCC)   Pressure injury of left thigh, stage 2 (HCC)   Counseling and coordination of care   Need for emotional support   Hypomagnesemia   Atrial fibrillation (HCC)   09/28/2023  Postoperative Diagnosis: SACRAL DECUBITUS ULCER    Surgical Procedure:  LAPAROSCOPIC LOOP COLOSTOMY  EXCISIONAL SHARP DEBRIDEMENT OF 24 CM TALL X 16 CM WIDE X 4 CM DEEP SACRAL WOUND USING A SCALPEL AND ELECTROCAUTERY    Operative Team Members:  Stechschulte, Hyman Hopes, MD - Primary   Findings: 24 cm tall by 16 cm wide by 4 cm deep large sacral wound with necrotic tissue   10/03/2023  POST-OPERATIVE DIAGNOSIS:   Necrotizing fasciitis and Fournier's gangrene of left anterior/posterior thigh, right posterior thigh, sacrum, back, left gluteus   PROCEDURE:  EXCISIONAL DEBRIDEMENT OF BACK, SACRUM, HIPS & THIGHS   SURGEON:  Ardeth Sportsman,  MD  OR FINDINGS:  Patient with extensive skin dermal and cutaneous fat necrosis with pockets of wet gangrene in a wide region.  Left posterior gluteus with some muscle necrosis    Consistent with necrotizing fasciitis/Fournier's gangrene.     Extensive debridement done.     Resulting wounds:  Left anterior thigh 10 x 4cm with 10 x 15 cm undermining on the muscle fascia Left posterior thigh 10 x 12 x 7 cm deep wound Right posterior thigh 9 x 6 x 4 cm wound Right posterior hip 6 x 4 x 8 cm deep wound Perineal and sacral decubitus wound 15 x 15 x 5 cm Back wound over left hip  with some muscle necrosis  15 x 9 x 6 cm stellate wound.   Undermining 30 x 18cm contiguous with sacral and left thigh wounds   CASE DATA:   Type of patient?: LDOW CASE (Surgical Hospitalist [facility] Inpatient) Status of Case? EMERGENT Add On Infection Present At Time Of Surgery (PAT  Assessment [facility] Stay = 11 days) 2 Days Post-Op    No progression of fasciitis but still pressor dependent    Assessment/Plan POD #9 s/p laparoscopic loop colostomy, excisional sharp debridement of sacral wound by Dr. Dossie Der on 09/28/23 POD #4 s/p extensive excisional debridement of back hip thighs and sacrum Dr Michaell Cowing 10/03/2023 POD#2 redebridement and repacking Dr. Michaell Cowing 10/05/2023  -Intermittently on vasopressin but slowly weaning back down on norepinephrine pressors  - WOCN consult for new ostomy. Ostomy bridge removed POD5 -See if can do dressing changes in ICU only  with nursing and wound ostomy/PT hydrotherapy team.   - Cont Abx.  Usually stop 5 days after source control equals 2/24, but given massive infection and deconditioned state consider extending to 10/14 days.  Defer to critical care.  Cx with E. Coli, Enterococcus faecalis, Pseudomonas. - Appreciate CCM assistance    FEN -liquid diet.  Full is reasonable.  Supplemental shakes.  Do not know if he can handle full regular diet while he remains on pressors. IVF  per primary  VTE - SCD's, heparin for DVT.  Challenge for it to be therapeutic.  Working with critical care and pharmacy to help troubleshoot   ID - Linezolid and Zosyn. Defer selection to primary. Usually stop 5 days after source control equals 2/24, but given massive infection and deconditioned state consider extending to 10/14 days.  Defer to critical care.  Foley - okay to remove from our standpoint   Anemia - s/p 1U 2/11, 2U 2/12, 2U 2/19. Hgb 8.7 Aspiration PNA L femoral DVT - newly diagnosed this admission Multiple myeloma on lenalidomide  Wheelchair bound HLD GERD     I reviewed nursing notes, last 24 h vitals and pain scores, last 48 h intake and output, last 24 h labs and trends, last 24 h imaging results, and CCM .  I have reviewed this patient's available data, including medical history, events of note, test results, etc as part of my evaluation.   A significant portion of that time was spent in counseling. Care during the described time interval was provided by me.  This care required high  level of medical decision making.  10/07/2023    Subjective: (Chief complaint)  No major events.  Patient resting.  Objective:  Vital signs:  Vitals:   10/07/23 0630 10/07/23 0645 10/07/23 0700 10/07/23 0715  BP: (!) 127/48 (!) 144/54 (!) 114/37 (!) 126/38  Pulse: 77 76 75 77  Resp: 16 16 17 17   Temp:      TempSrc:      SpO2: 92% 93% 94% 94%  Weight:      Height:        Last BM Date : 10/05/23  Intake/Output   Yesterday:  02/20 0701 - 02/21 0700 In: 2767.4 [P.O.:750; I.V.:749.8; Blood:350; IV Piggyback:917.7] Out: 2350 [Urine:1300; Stool:1050] This shift:  Total I/O In: 54.3 [I.V.:54.3] Out: -   Bowel function:  Flatus: YES  BM:  YES  Drain: (No drain)   Physical Exam:  General: Pt awake/alert in no acute distress Eyes: PERRL, normal EOM.  Sclera clear.  No icterus Neuro: CN II-XII intact w/o focal sensory/motor deficits. Lymph: No head/neck/groin  lymphadenopathy Psych:  No delerium/psychosis/paranoia.  Oriented x 4 HENT: Normocephalic, Mucus membranes moist.  No thrush Neck: Supple, No tracheal deviation.  No obvious thyromegaly Chest: No pain to chest wall compression.  Good respiratory excursion.  No audible wheezing CV:  Pulses intact.  Regular rhythm.  No major extremity edema MS: Normal AROM mjr joints.  No obvious deformity Abdomen: Soft.  Nondistended.  Nontender.  No evidence of peritonitis.  No incarcerated hernias. Ext: Bilateral foot ulcer air boots.  chronic edema unchanged .  No cyanosis  Skin: Patient with extensive wounds and packing on back gluteus and thighs.  Some serosanguineous drainage.  Still with edema and epidermal lysis and some erythema on right thigh and right lower quadrant but stable to slightly regressing.  Cellulitis not progressing at this time    Results:   Cultures: Recent Results (from the past 720 hours)  Urine Culture     Status: None   Collection Time: 09/26/23  9:48 PM   Specimen: Urine, Clean Catch  Result Value Ref Range Status   Specimen Description   Final    URINE, CLEAN CATCH Performed at [facility], [address], [place], [place] [address]    Special Requests   Final    NONE Performed at [facility], [address] [place], [place] [address]    Culture   Final    NO GROWTH Performed at [facility], [address], [place], [place] [address]    Report Status 09/27/2023 FINAL  Final  Culture, blood (routine x 2)     Status: None   Collection Time: 09/26/23 10:25 PM   Specimen: BLOOD  Result Value Ref Range Status   Specimen Description   Final    BLOOD LEFT ANTECUBITAL Performed at [facility], [address], [place], [place] [address]    Special Requests   Final    BOTTLES DRAWN AEROBIC AND ANAEROBIC Blood Culture results may not be optimal due to an inadequate volume of blood received in culture  bottles Performed at [facility], [address], [place], [place] [address]    Culture   Final    NO GROWTH 5 DAYS Performed at [facility], [address] [place], [place] [address]    Report Status 10/02/2023 FINAL  Final  Culture, blood (routine x 2)     Status: None   Collection Time: 09/26/23 10:30 PM   Specimen: BLOOD  Result Value Ref Range Status   Specimen Description   Final    BLOOD BLOOD RIGHT ARM Performed at [facility], [address], [place], [place] [address]    Special Requests   Final    BOTTLES DRAWN AEROBIC AND ANAEROBIC Blood Culture results may not be optimal due to an inadequate volume of blood received in culture bottles Performed at [facility], [address] [place], [place] [address]    Culture   Final    NO GROWTH 5 DAYS Performed at [facility], [address] [place], [place] [address]    Report Status 10/02/2023 FINAL  Final  MRSA Next Gen by PCR, Nasal     Status: None   Collection Time: 09/26/23 10:40 PM   Specimen: Nasal Mucosa; Nasal Swab  Result Value Ref Range Status   MRSA by PCR Next Gen NOT DETECTED NOT DETECTED Final    Comment: (NOTE) The GeneXpert MRSA Assay (FDA approved for NASAL specimens only), is one component of a comprehensive MRSA colonization surveillance program. It is not intended to diagnose MRSA infection nor to guide or monitor treatment for MRSA infections. Test performance is not FDA approved in patients less than 47 years old. Performed at [facility], [address], [place], [place] [address]   MRSA Next Gen by PCR, Nasal     Status: None   Collection Time: 09/27/23  1:24 PM   Specimen: Nasal Mucosa; Nasal Swab  Result Value Ref Range Status   MRSA by PCR Next Gen NOT DETECTED NOT DETECTED Final    Comment: (NOTE) The GeneXpert MRSA Assay (FDA approved for NASAL specimens only), is one component of a  comprehensive MRSA colonization surveillance program. It is not intended to diagnose MRSA infection nor to guide or monitor treatment for MRSA infections. Test performance is not FDA approved  in patients less than 22 years old. Performed at [facility], [address], [place], [place] [address]   Aerobic/Anaerobic Culture w Gram Stain (surgical/deep wound)     Status: None   Collection Time: 09/28/23 11:37 AM   Specimen: PATH Soft tissue  Result Value Ref Range Status   Specimen Description TISSUE  Final   Special Requests SACRAL WOUND  Final   Gram Stain   Final    NO WBC SEEN MODERATE GRAM POSITIVE RODS MODERATE GRAM NEGATIVE RODS RARE GRAM POSITIVE COCCI IN CLUSTERS    Culture   Final    RARE ESCHERICHIA COLI RARE ENTEROCOCCUS FAECALIS RARE PSEUDOMONAS AERUGINOSA FEW BACTEROIDES THETAIOTAOMICRON BETA LACTAMASE POSITIVE Performed at [facility], [address], [place], [place] [address]    Report Status 10/02/2023 FINAL  Final   Organism ID, Bacteria ESCHERICHIA COLI  Final   Organism ID, Bacteria ENTEROCOCCUS FAECALIS  Final   Organism ID, Bacteria PSEUDOMONAS AERUGINOSA  Final      Susceptibility   Escherichia coli - MIC*    AMPICILLIN <=2 SENSITIVE Sensitive     CEFEPIME <=0.12 SENSITIVE Sensitive     CEFTAZIDIME <=1 SENSITIVE Sensitive     CEFTRIAXONE <=0.25 SENSITIVE Sensitive     CIPROFLOXACIN <=0.25 SENSITIVE Sensitive     GENTAMICIN <=1 SENSITIVE Sensitive     IMIPENEM <=0.25 SENSITIVE Sensitive     TRIMETH/SULFA <=20 SENSITIVE Sensitive     AMPICILLIN/SULBACTAM <=2 SENSITIVE Sensitive     PIP/TAZO <=4 SENSITIVE Sensitive ug/mL    * RARE ESCHERICHIA COLI   Enterococcus faecalis - MIC*    AMPICILLIN <=2 SENSITIVE Sensitive     VANCOMYCIN 2 SENSITIVE Sensitive     GENTAMICIN SYNERGY SENSITIVE Sensitive     * RARE ENTEROCOCCUS FAECALIS   Pseudomonas aeruginosa - MIC*    CEFTAZIDIME 4 SENSITIVE Sensitive     CIPROFLOXACIN <=0.25  SENSITIVE Sensitive     GENTAMICIN 2 SENSITIVE Sensitive     IMIPENEM 2 SENSITIVE Sensitive     PIP/TAZO 16 SENSITIVE Sensitive ug/mL    CEFEPIME 2 SENSITIVE Sensitive     * RARE PSEUDOMONAS AERUGINOSA    Labs: Results for orders placed or performed during the hospital encounter of 09/26/23 (from the past 48 hours)  Glucose, capillary     Status: Abnormal   Collection Time: 10/05/23 12:32 PM  Result Value Ref Range   Glucose-Capillary 174 (H) 70 - 99 mg/dL    Comment: Glucose reference range applies only to samples taken after fasting for at least 8 hours.  Glucose, capillary     Status: Abnormal   Collection Time: 10/05/23  5:44 PM  Result Value Ref Range   Glucose-Capillary 185 (H) 70 - 99 mg/dL    Comment: Glucose reference range applies only to samples taken after fasting for at least 8 hours.  Hemoglobin and hematocrit, blood     Status: Abnormal   Collection Time: 10/05/23  6:13 PM  Result Value Ref Range   Hemoglobin 9.0 (L) 13.0 - 17.0 g/dL    Comment: REPEATED TO VERIFY POST TRANSFUSION SPECIMEN    HCT 27.2 (L) 39.0 - 52.0 %    Comment: Performed at [facility], [address] [place], [place] [address]  Glucose, capillary     Status: Abnormal   Collection Time: 10/05/23  9:38 PM  Result Value Ref Range   Glucose-Capillary 170 (H) 70 - 99 mg/dL    Comment: Glucose reference range applies only to samples  taken after fasting for at least 8 hours.   Comment 1 Notify RN    Comment 2 Document in Chart   CBC     Status: Abnormal   Collection Time: 10/06/23  4:34 AM  Result Value Ref Range   WBC 2.4 (L) 4.0 - 10.5 K/uL   RBC 2.77 (L) 4.22 - 5.81 MIL/uL   Hemoglobin 8.7 (L) 13.0 - 17.0 g/dL   HCT 81.1 (L) 91.4 - 78.2 %   MCV 95.7 80.0 - 100.0 fL   MCH 31.4 26.0 - 34.0 pg   MCHC 32.8 30.0 - 36.0 g/dL   RDW 95.6 (H) 21.3 - 08.6 %   Platelets 191 150 - 400 K/uL   nRBC 0.0 0.0 - 0.2 %    Comment: Performed at [facility], [address] [place], [place] [address]  Basic metabolic panel     Status: Abnormal   Collection Time: 10/06/23  4:34 AM  Result Value Ref Range   Sodium 131 (L) 135 - 145 mmol/L   Potassium 3.6 3.5 - 5.1 mmol/L   Chloride 101 98 - 111 mmol/L   CO2 22 22 - 32 mmol/L   Glucose, Bld 129 (H) 70 - 99 mg/dL    Comment: Glucose reference range applies only to samples taken after fasting for at least 8 hours.   BUN 16 8 - 23 mg/dL   Creatinine, Ser 9.62 0.61 - 1.24 mg/dL   Calcium 7.7 (L) 8.9 - 10.3 mg/dL   GFR, Estimated >95 >28 mL/min    Comment: (NOTE) Calculated using the CKD-EPI Creatinine Equation (2021)    Anion gap 8 5 - 15    Comment: Performed at [facility], [address], [place], [place] [address]  Magnesium     Status: Abnormal   Collection Time: 10/06/23  4:34 AM  Result Value Ref Range   Magnesium 1.6 (L) 1.7 - 2.4 mg/dL    Comment: Performed at [facility], [address], [place], [place] [address]  Phosphorus     Status: None   Collection Time: 10/06/23  4:34 AM  Result Value Ref Range   Phosphorus 2.6 2.5 - 4.6 mg/dL    Comment: Performed at [facility], [address], [place], [place] [address]  Glucose, capillary     Status: Abnormal   Collection Time: 10/06/23  8:12 AM  Result Value Ref Range   Glucose-Capillary 124 (H) 70 - 99 mg/dL    Comment: Glucose reference range applies only to samples taken after fasting for at least 8 hours.  Glucose, capillary     Status: Abnormal   Collection Time: 10/06/23 11:37 AM  Result Value Ref Range   Glucose-Capillary 181 (H) 70 - 99 mg/dL    Comment: Glucose reference range applies only to samples taken after fasting for at least 8 hours.  Glucose, capillary     Status: Abnormal   Collection Time: 10/06/23  4:07 PM  Result Value Ref Range   Glucose-Capillary 165 (H) 70 - 99 mg/dL    Comment: Glucose reference range applies only to samples taken after fasting for at  least 8 hours.  Glucose, capillary     Status: Abnormal   Collection Time: 10/06/23  9:23 PM  Result Value Ref Range   Glucose-Capillary 132 (H) 70 - 99 mg/dL    Comment: Glucose reference range applies only to samples taken after fasting for at least 8 hours.  Heparin level (unfractionated)  Status: Abnormal   Collection Time: 10/07/23  3:29 AM  Result Value Ref Range   Heparin Unfractionated <0.10 (L) 0.30 - 0.70 IU/mL    Comment: (NOTE) The clinical reportable range upper limit is being lowered to >1.10 to align with the FDA approved guidance for the current laboratory assay.  If heparin results are below expected values, and patient dosage has  been confirmed, suggest follow up testing of antithrombin III levels. Performed at [facility], [address] [place], [place] [address]   CBC     Status: Abnormal   Collection Time: 10/07/23  3:29 AM  Result Value Ref Range   WBC 3.9 (L) 4.0 - 10.5 K/uL   RBC 2.51 (L) 4.22 - 5.81 MIL/uL   Hemoglobin 7.8 (L) 13.0 - 17.0 g/dL   HCT 08.6 (L) 57.8 - 46.9 %   MCV 94.0 80.0 - 100.0 fL   MCH 31.1 26.0 - 34.0 pg   MCHC 33.1 30.0 - 36.0 g/dL   RDW 62.9 (H) 52.8 - 41.3 %   Platelets 247 150 - 400 K/uL   nRBC 0.0 0.0 - 0.2 %    Comment: Performed at [facility], [address], [place], [place] [address]  Basic metabolic panel     Status: Abnormal   Collection Time: 10/07/23  3:29 AM  Result Value Ref Range   Sodium 131 (L) 135 - 145 mmol/L   Potassium 3.5 3.5 - 5.1 mmol/L   Chloride 96 (L) 98 - 111 mmol/L   CO2 24 22 - 32 mmol/L   Glucose, Bld 133 (H) 70 - 99 mg/dL    Comment: Glucose reference range applies only to samples taken after fasting for at least 8 hours.   BUN 15 8 - 23 mg/dL   Creatinine, Ser 0.27 0.61 - 1.24 mg/dL   Calcium 7.6 (L) 8.9 - 10.3 mg/dL   GFR, Estimated >25 >36 mL/min    Comment: (NOTE) Calculated using the CKD-EPI Creatinine Equation (2021)    Anion gap 11 5 -  15    Comment: Performed at [facility], [address], [place], [place] [address]  Magnesium     Status: None   Collection Time: 10/07/23  3:29 AM  Result Value Ref Range   Magnesium 1.7 1.7 - 2.4 mg/dL    Comment: Performed at [facility], [address] [place], [place] [address]  Phosphorus     Status: Abnormal   Collection Time: 10/07/23  3:29 AM  Result Value Ref Range   Phosphorus 1.7 (L) 2.5 - 4.6 mg/dL    Comment: Performed at [facility], [address] [place], [place] [address]    Imaging / Studies: No results found.   Medications / Allergies: per chart  Antibiotics: Anti-infectives (From admission, onward)    Start     Dose/Rate Route Frequency Ordered Stop   10/04/23 0900  linezolid (ZYVOX) IVPB 600 mg        600 mg 300 mL/hr over 60 Minutes Intravenous Every 12 hours 10/04/23 0809     10/04/23 0830  clindamycin (CLEOCIN) IVPB 900 mg  Status:  Discontinued        900 mg 100 mL/hr over 30 Minutes Intravenous Every 6 hours 10/04/23 0730 10/04/23 0809   09/27/23 1100  piperacillin-tazobactam (ZOSYN) IVPB 3.375 g        3.375 g 12.5 mL/hr over 240 Minutes Intravenous Every 8 hours 09/27/23 1052  09/27/23 0600  ceFEPIme (MAXIPIME) 2 g in sodium chloride 0.9 % 100 mL IVPB  Status:  Discontinued        2 g 200 mL/hr over 30 Minutes Intravenous Every 24 hours 09/27/23 0021 09/27/23 1025   09/27/23 0030  vancomycin (VANCOREADY) IVPB 2000 mg/400 mL        2,000 mg 200 mL/hr over 120 Minutes Intravenous  Once 09/27/23 0021 09/27/23 0333   09/27/23 0021  vancomycin variable dose per unstable renal function (pharmacist dosing)  Status:  Discontinued         Does not apply See admin instructions 09/27/23 0021 09/27/23 0416   09/26/23 2200  cefTRIAXone (ROCEPHIN) 2 g in sodium chloride 0.9 % 100 mL IVPB        2 g 200 mL/hr over 30 Minutes Intravenous  Once 09/26/23 2148 09/26/23 2323          Note: Portions of this report may have been transcribed using voice recognition software. Every effort was made to ensure accuracy; however, inadvertent computerized transcription errors may be present.   Any transcriptional errors that result from this process are unintentional.    Ardeth Sportsman, MD, FACS, MASCRS Esophageal, Gastrointestinal & Colorectal Surgery Robotic and Minimally Invasive Surgery  [facility] [address] [place], [place] [address] [phone] Fax [phone] Main  CONTACT INFORMATION: Weekday (9AM-5PM): Call [facility] main office at [phone] Weeknight (5PM-9AM) or Weekend/Holiday: Check EPIC "Web Links" tab & use "AMION" (password " [email or web address]") for General Surgery [facility] coverage  Please, DO NOT use SecureChat  (it is not reliable communication to reach operating surgeons & will lead to a delay in care).   Epic staff messaging available for outptient concerns needing 1-2 business day response.      10/07/2023  8:15 AM

## 2023-10-07 NOTE — Plan of Care (Signed)
 Patient weaned off of levophed overnight and pain managed

## 2023-10-07 NOTE — Progress Notes (Signed)
 NAME:  Derek [name], MRN:  [id number], DOB:  08-26-1945, LOS: 11 ADMISSION DATE:  09/26/2023, CONSULTATION DATE: 09/27/2023 REFERRING MD: Loetta Rough, MD, CHIEF COMPLAINT: hypotension   History of Present Illness:  A 78 y.o. male with multiple myeloma on chemotherapy, dyslipidemia, and GERD.  Brought in by family for increasing lower extremity edema, perineal weeping sores, and worsening generalized weakness for 1-2 weeks.  At baseline patient relatively independent.  He is experience rapid decline in function recently.  Per patient report because he is difficult to move he has been left in his urine and feces.  Per wife the patient experiences pain and does not want to be moved. Smoker. In ED, he was found to be hypotensive and given 2.5 L LR, Vanco, Rocephin, Cefepime, Levophed @ 7 mcg/min now, Rx for hyperK, and HTS for hypoNa.    Pertinent  Medical History  Multiple myeloma on chemotherapy, dyslipidemia, GERD  Significant Hospital Events: Including procedures, antibiotic start and stop dates in addition to other pertinent events   2/11 admit, CCS consult 2/12 Surgical Procedure:  LAPAROSCOPIC LOOP COLOSTOMY  EXCISIONAL SHARP DEBRIDEMENT OF 24 CM TALL X 16 CM WIDE X 4 CM DEEP SACRAL WOUND USING A SCALPEL AND ELECTROCAUTERY  Left on vent for Afib/shock state postop 2/13 extubated  2/15 on amiodarone, on Levophed 2/17 to OR for debridement, fourniers and nec fasc seen 2/19 return to OR for debridement. Hgb down to 6.8, 2u PRBC 2/21 remains on low dose pressors.   Interim History / Subjective:  No acute events overnight Complained of some wound related pain this morning which is improved with percocet.  Remains on NE 2 and vaso 0.03 5L positive for the admission Tmax 99.4  Objective   Blood pressure (!) 126/38, pulse 77, temperature 98.6 F (37 C), temperature source Axillary, resp. rate 17, height 5\' 6"  (1.676 m), weight 118.8 kg, SpO2 94%.        Intake/Output Summary  (Last 24 hours) at 10/07/2023 0947 Last data filed at 10/07/2023 0824 Gross per 24 hour  Intake 2417.69 ml  Output 3075 ml  Net -657.31 ml   Filed Weights   10/04/23 0556 10/05/23 0500 10/06/23 0438  Weight: 120.2 kg 119.3 kg 118.8 kg    Examination:  General: Elderly male resting comfortably in bed Neuro: Alert, oriented, non-focal HEENT: [place]/AT, PERRL, no JVD Cardiovascular: IRIR, rate controlled, no MRG Lungs: Clear bilateral breath sounds.  Abdomen: Soft, NT, ND. Stoma wnl with liquid stool.  Musculoskeletal: Surgical dressings in place with minimal strikethrough. Skin: Multiple sacral/perineal wounds (see images).   Na 131, K 3.5, mg 1.7, Creat 0.69, Phos 1.7 WBC 3.9, Hgb 7.8, plt 247 CBG 120-180 range  Assessment & Plan:   Septic shock secondary to large necrotic sacral ulcer s/p debridement - cultures with rare E.coli, rare Enterococcus faecalis, rare Pseudomonas aeruginosa, few bacteriodes thetaiotaomicron Diverting ostomy 09/28/2023 - NE, Vaso for MAP 65, making slow continuous progress with weaning.  - Midodrine continue - Zosyn, linezolid will plan for 10 total days after source control. 3/3 tentative stop date.  - Surgery is hopeful he will not require any further intervention. Bedside dressing changes.  - WOC, hydrotherapy  Atrial fibrillation - Rate controlled on amiodarone - Continue Amiodarone - heparin infusion resumed 2/20 - trend H&H - Maintain K > 4, Mg > 2  Left femoral DVT - Restart Heparin, has been subtherapeutic so pharmacy working on increasing dose   Anasarca with volume overload Echo from 07/2023 with EF  60-65% - Will see how he does weaning pressors before considering diuresis.  - Maintain renal perfusion - Avoid nephrotoxic medications  Hypokalemia - s/p repletion Hypomagnesemia - has remained low despite repletion past few days Hypophosphatemia - Replacing Mag, K, Phos - Follow BMP  ABLA - presumed acute after wound  debridement Thrombocytopenia - stable - Goal Hgb > 7 - Follow CBC  Wheelchair-bound at baseline Failure to thrive History of multiple myeloma on chemotherapy - Appreciate palliative care follow-up, currently opting for aggressive measures.   Best Practice (right click and "Reselect all SmartList Selections" daily)   Diet/type: Full liquid DVT prophylaxis: heparin infusion Pressure ulcer(s): present on admission  LARGE wounds GI prophylaxis: oral ppi  Lines: Central line and Arterial Line Foley:  Yes, and it is still needed Code Status:  full code Last date of multidisciplinary goals of care discussion [palliative care medicine continues to follow]   CC time:  37 min.    Joneen Roach, AGACNP-BC [facility]  See Amion for personal pager PCCM on call pager [phone] until 7pm. Please call Elink 7p-7a. [phone]  10/07/2023 9:47 AM

## 2023-10-07 NOTE — Progress Notes (Signed)
 PHARMACY - ANTICOAGULATION CONSULT NOTE  Pharmacy Consult for enoxaparin Indication: acute DVT  No Known Allergies  Patient Measurements: Height: 5\' 6"  (167.6 cm) Weight: 118.8 kg (262 lb) IBW/kg (Calculated) : 63.8  Vital Signs: Temp: 99.2 F (37.3 C) (02/21 1257) Temp Source: Axillary (02/21 1257) BP: 126/38 (02/21 0715) Pulse Rate: 77 (02/21 0715)  Labs: Recent Labs    10/04/23 2337 10/05/23 0504 10/05/23 0504 10/05/23 1813 10/06/23 0434 10/07/23 0329  HGB  --  6.8*   < > 9.0* 8.7* 7.8*  HCT  --  20.5*   < > 27.2* 26.5* 23.6*  PLT  --  146*  --   --  191 247  HEPARINUNFRC 0.18*  --   --   --   --  <0.10*  CREATININE  --  0.76  --   --  0.77 0.69   < > = values in this interval not displayed.    Estimated Creatinine Clearance: 95.3 mL/min (by C-G formula based on SCr of 0.69 mg/dL).   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 male with limited mobility (wheelchair- bound), multiple myeloma, and sacral wound who presented to the ED on 09/26/2023 with c/o generalized weakness, worsening of LE edema and weeping leg wounds.  LE doppler on 09/27/23 showed acute deep vein thrombosis involving the left  femoral vein. Anticoag. held off at the time due to low blood count.   Pharmacy was consulted to initiate heparin drip on 2/15. Heparin level has persistently been undetectable, now infusing at 30 units/kg/hr heparin dosing weight. No further surgical intervention planned. D/w CCM, will transition anticoagulation to enoxaparin.   Significant events:  - 2/11: 1 unit PRBC - 2/12: 2 unit PRBC - 2/12: I&D of sacral wound with loop colostomy.  - 2/17: I&D of sacral wound.  - 2/19: OR for exploration, redebridement. Transfused - 2/21: Confirmed with CCS, no  further surgical intervention planned  Today, 10/07/2023: CBC: Hgb remains low and decreased, Plt improved to WNL SCr <1, stable. CrCl 95 mL/min  Goal of Therapy:  Anti-Xa level 0.6 - 1 units/mL 4 hours after LMWH dose   Plan:  Discontinue UFH Initiate enoxaparin 120 mg (1 mg/kg) subQ q12h Continue to monitor CBC, renal function Check anti-Xa level after 3rd or 4th dose of LMWH Monitor for signs of bleeding  Cindi Carbon, PharmD 10/07/23 1:42 PM

## 2023-10-08 DIAGNOSIS — A419 Sepsis, unspecified organism: Secondary | ICD-10-CM | POA: Diagnosis not present

## 2023-10-08 DIAGNOSIS — M726 Necrotizing fasciitis: Secondary | ICD-10-CM | POA: Diagnosis not present

## 2023-10-08 DIAGNOSIS — I4891 Unspecified atrial fibrillation: Secondary | ICD-10-CM | POA: Diagnosis not present

## 2023-10-08 DIAGNOSIS — R6521 Severe sepsis with septic shock: Secondary | ICD-10-CM | POA: Diagnosis not present

## 2023-10-08 LAB — BASIC METABOLIC PANEL
Anion gap: 8 (ref 5–15)
BUN: 12 mg/dL (ref 8–23)
CO2: 25 mmol/L (ref 22–32)
Calcium: 7.8 mg/dL — ABNORMAL LOW (ref 8.9–10.3)
Chloride: 100 mmol/L (ref 98–111)
Creatinine, Ser: 0.8 mg/dL (ref 0.61–1.24)
GFR, Estimated: >60 mL/min (ref >60)
Glucose, Bld: 116 mg/dL — ABNORMAL HIGH (ref 70–99)
Potassium: 3.5 mmol/L (ref 3.5–5.1)
Sodium: 133 mmol/L — ABNORMAL LOW (ref 135–145)

## 2023-10-08 LAB — GLUCOSE, CAPILLARY
Glucose-Capillary: 115 mg/dL — ABNORMAL HIGH (ref 70–99)
Glucose-Capillary: 191 mg/dL — ABNORMAL HIGH (ref 70–99)
Glucose-Capillary: 179 mg/dL — ABNORMAL HIGH (ref 70–99)
Glucose-Capillary: 149 mg/dL — ABNORMAL HIGH (ref 70–99)

## 2023-10-08 LAB — CBC
HCT: 25.3 % — ABNORMAL LOW (ref 39.0–52.0)
Hemoglobin: 8 g/dL — ABNORMAL LOW (ref 13.0–17.0)
MCH: 30.9 pg (ref 26.0–34.0)
MCHC: 31.6 g/dL (ref 30.0–36.0)
MCV: 97.7 fL (ref 80.0–100.0)
Platelets: 275 10*3/uL (ref 150–400)
RBC: 2.59 MIL/uL — ABNORMAL LOW (ref 4.22–5.81)
RDW: 15.7 % — ABNORMAL HIGH (ref 11.5–15.5)
WBC: 4.2 10*3/uL (ref 4.0–10.5)
nRBC: 0 % (ref 0.0–0.2)

## 2023-10-08 LAB — MAGNESIUM: Magnesium: 1.7 mg/dL (ref 1.7–2.4)

## 2023-10-08 LAB — PHOSPHORUS: Phosphorus: 2.3 mg/dL — ABNORMAL LOW (ref 2.5–4.6)

## 2023-10-08 MED ORDER — POTASSIUM & SODIUM PHOSPHATES 280-160-250 MG PO PACK
2.0000 | PACK | ORAL | Status: AC
  Administered 2023-10-08 (×3): 2 via ORAL
  Filled 2023-10-08 (×3): qty 2

## 2023-10-08 NOTE — Plan of Care (Signed)

## 2023-10-08 NOTE — Progress Notes (Signed)
 NAME:  Derek [name], MRN:  [id number], DOB:  09/25/1945, LOS: 12 ADMISSION DATE:  09/26/2023, CONSULTATION DATE: 09/27/2023 REFERRING MD: Loetta Rough, MD, CHIEF COMPLAINT: hypotension   History of Present Illness:  A 78 y.o. male with multiple myeloma on chemotherapy, dyslipidemia, and GERD.  Brought in by family for increasing lower extremity edema, perineal weeping sores, and worsening generalized weakness for 1-2 weeks.  At baseline patient relatively independent.  He is experience rapid decline in function recently.  Per patient report because he is difficult to move he has been left in his urine and feces.  Per wife the patient experiences pain and does not want to be moved. Smoker. In ED, he was found to be hypotensive and given 2.5 L LR, Vanco, Rocephin, Cefepime, Levophed @ 7 mcg/min now, Rx for hyperK, and HTS for hypoNa.    Pertinent  Medical History  Multiple myeloma on chemotherapy, dyslipidemia, GERD  Significant Hospital Events: Including procedures, antibiotic start and stop dates in addition to other pertinent events   2/11 admit, CCS consult 2/12 Surgical Procedure:  LAPAROSCOPIC LOOP COLOSTOMY  EXCISIONAL SHARP DEBRIDEMENT OF 24 CM TALL X 16 CM WIDE X 4 CM DEEP SACRAL WOUND USING A SCALPEL AND ELECTROCAUTERY  Left on vent for Afib/shock state postop 2/13 extubated  2/15 on amiodarone, on Levophed 2/17 to OR for debridement, fourniers and nec fasc seen 2/19 return to OR for debridement. Hgb down to 6.8, 2u PRBC 2/21 remains on low dose pressors.   Interim History / Subjective:   Critically ill, remains on low-dose Levophed Afebrile Good urine output 3.6 L  Objective   Blood pressure (!) 142/46, pulse (!) 59, temperature (!) 97.5 F (36.4 C), temperature source Oral, resp. rate 14, height 5\' 6"  (1.676 m), weight 115.7 kg, SpO2 98%.        Intake/Output Summary (Last 24 hours) at 10/08/2023 1215 Last data filed at 10/08/2023 1156 Gross per 24 hour  Intake  1002.38 ml  Output 5850 ml  Net -4847.62 ml   Filed Weights   10/05/23 0500 10/06/23 0438 10/08/23 0500  Weight: 119.3 kg 118.8 kg 115.7 kg    Examination:  General: Elderly male resting comfortably in bed Neuro: Alert, oriented, non-focal HEENT: [place]/AT, PERRL, no JVD Cardiovascular: S1-S2 irregular Lungs: Clear bilateral breath sounds.  Abdomen: Soft, NT, ND. Stoma wnl with liquid stool.  Musculoskeletal: Surgical dressings in place with minimal strikethrough. Skin: Multiple sacral/perineal wounds (see images).  Labs show improving hyponatremia, mild hypophosphatemia and hypokalemia, no leukocytosis, stable anemia   Assessment & Plan:   Septic shock secondary to large necrotic sacral ulcer s/p debridement - cultures with rare E.coli, rare Enterococcus faecalis, rare Pseudomonas aeruginosa, few bacteriodes thetaiotaomicron Diverting ostomy 09/28/2023 -Wean Levophed to off, SBP 90 acceptable - Midodrine continue - Zosyn, linezolid will plan for 10 total days after source control. 3/3 tentative stop date.  - Surgery is hopeful he will not require any further intervention. Bedside dressing changes, ostomy care - WOC, hydrotherapy  Atrial fibrillation - Rate controlled on amiodarone - Continue Amiodarone -Continue heparin infusion  - trend H&H - Maintain K > 4, Mg > 2  Left femoral DVT - Restart Heparin, has been subtherapeutic so pharmacy working on increasing dose   Anasarca with volume overload Echo from 07/2023 with EF 60-65% -Auto diuresing , which again indicates that he is perfusing well  Hypokalemia - s/p repletion Hypomagnesemia - has remained low despite repletion past few days Hypophosphatemia - Replacing  K, Phos -  Follow BMP  ABLA - presumed acute after wound debridement Thrombocytopenia - stable - Goal Hgb > 7 - Follow CBC  Wheelchair-bound at baseline Failure to thrive History of multiple myeloma on chemotherapy - Appreciate palliative care  follow-up, currently opting for aggressive measures. -PT eval   Best Practice (right click and "Reselect all SmartList Selections" daily)   Diet/type: Full liquid DVT prophylaxis: heparin infusion Pressure ulcer(s): present on admission   GI prophylaxis: oral ppi  Lines: Central line and Arterial Line Foley:  Yes, and it is still needed Code Status:  full code Last date of multidisciplinary goals of care discussion [palliative care medicine continues to follow]   CC time:  32 min.    Cyril Mourning MD. Tonny Bollman. [facility] Pager : [phone]  If no response to pager , please call [phone] until 7 pm After 7:00 pm call Elink  [phone]     10/08/2023 12:15 PM

## 2023-10-08 NOTE — Progress Notes (Addendum)
 3 Days Post-Op   Subjective/Chief Complaint: No acute events   Objective: Vital signs in last 24 hours: Temp:  [97.7 F (36.5 C)-99.2 F (37.3 C)] 98 F (36.7 C) (02/22 0400) Pulse Rate:  [63-83] 66 (02/22 0500) Resp:  [13-31] 13 (02/22 0500) BP: (96-152)/(45-75) 120/64 (02/22 0500) SpO2:  [91 %-98 %] 97 % (02/22 0500) Arterial Line BP: (61-272)/(29-261) 104/47 (02/22 0500) Weight:  [115.7 kg] 115.7 kg (02/22 0500) Last BM Date : 10/07/23  Intake/Output from previous day: 02/21 0701 - 02/22 0700 In: 1765.2 [I.V.:526.2; IV Piggyback:1239] Out: 4650 [Urine:3600; Stool:1050] Intake/Output this shift: No intake/output data recorded.  Resting comfortably Unlabored respirations Abdomen is soft, nontender, nondistended.  Ostomy is viable with thin liquid stool in the bag.  Lab Results:  Recent Labs    10/07/23 0329 10/08/23 0458  WBC 3.9* 4.2  HGB 7.8* 8.0*  HCT 23.6* 25.3*  PLT 247 275   BMET Recent Labs    10/07/23 0329 10/08/23 0458  NA 131* 133*  K 3.5 3.5  CL 96* 100  CO2 24 25  GLUCOSE 133* 116*  BUN 15 12  CREATININE 0.69 0.80  CALCIUM 7.6* 7.8*   PT/INR No results for input(s): "LABPROT", "INR" in the last 72 hours. ABG No results for input(s): "PHART", "HCO3" in the last 72 hours.  Invalid input(s): "PCO2", "PO2"  Studies/Results: No results found.  Anti-infectives: Anti-infectives (From admission, onward)    Start     Dose/Rate Route Frequency Ordered Stop   10/04/23 0900  linezolid (ZYVOX) IVPB 600 mg        600 mg 300 mL/hr over 60 Minutes Intravenous Every 12 hours 10/04/23 0809     10/04/23 0830  clindamycin (CLEOCIN) IVPB 900 mg  Status:  Discontinued        900 mg 100 mL/hr over 30 Minutes Intravenous Every 6 hours 10/04/23 0730 10/04/23 0809   09/27/23 1100  piperacillin-tazobactam (ZOSYN) IVPB 3.375 g        3.375 g 12.5 mL/hr over 240 Minutes Intravenous Every 8 hours 09/27/23 1052     09/27/23 0600  ceFEPIme (MAXIPIME) 2 g in  sodium chloride 0.9 % 100 mL IVPB  Status:  Discontinued        2 g 200 mL/hr over 30 Minutes Intravenous Every 24 hours 09/27/23 0021 09/27/23 1025   09/27/23 0030  vancomycin (VANCOREADY) IVPB 2000 mg/400 mL        2,000 mg 200 mL/hr over 120 Minutes Intravenous  Once 09/27/23 0021 09/27/23 0333   09/27/23 0021  vancomycin variable dose per unstable renal function (pharmacist dosing)  Status:  Discontinued         Does not apply See admin instructions 09/27/23 0021 09/27/23 0416   09/26/23 2200  cefTRIAXone (ROCEPHIN) 2 g in sodium chloride 0.9 % 100 mL IVPB        2 g 200 mL/hr over 30 Minutes Intravenous  Once 09/26/23 2148 09/26/23 2323       Assessment/Plan: POD 10 s/p laparoscopic loop colostomy, excisional sharp debridement of sacral wound by Dr. Dossie Der on 09/28/23 POD 5/3 s/p excisional debridement of back, sacrum, hips and thighs for necrotizing fasciitis Dr. Michaell Cowing - Afebrile, white count 4.2, stable low-dose levo requirement at 3 - WOCN consult for new ostomy. Ostomy bridge removed POD5 - Cont BID WTD dressings to wounds, hydrotherapy.  Continue specialty mattress, frequent turning, maximize nutrition as able.  Will plan to check wound Monday unless there is a clinical decline over the  weekend. - Cont abx. Cx with E. Coli, Enterococcus faecalis, Pseudomonas. - Appreciate CCM assistance    FEN - FLD. Would be hesistant to adv diet while still on pressors. IVF per primary  VTE - SCD's, okay for heparin gtt with no bolus  ID - Linezolid and Zosyn. Defer selection to primary  Foley - okay to remove from our standpoint   Anemia - s/p 1U 2/11, 2U 2/12, 2U 2/19. Hgb 8.7 Aspiration PNA L femoral DVT - newly diagnosed this admission Multiple myeloma on lenalidomide  Wheelchair bound HLD GERD  LOS: 12 days    Derek [name] 10/08/2023

## 2023-10-09 DIAGNOSIS — R6521 Severe sepsis with septic shock: Secondary | ICD-10-CM | POA: Diagnosis not present

## 2023-10-09 DIAGNOSIS — I4891 Unspecified atrial fibrillation: Secondary | ICD-10-CM | POA: Diagnosis not present

## 2023-10-09 DIAGNOSIS — A419 Sepsis, unspecified organism: Secondary | ICD-10-CM | POA: Diagnosis not present

## 2023-10-09 DIAGNOSIS — M726 Necrotizing fasciitis: Secondary | ICD-10-CM | POA: Diagnosis not present

## 2023-10-09 LAB — GLUCOSE, CAPILLARY
Glucose-Capillary: 99 mg/dL (ref 70–99)
Glucose-Capillary: 189 mg/dL — ABNORMAL HIGH (ref 70–99)
Glucose-Capillary: 124 mg/dL — ABNORMAL HIGH (ref 70–99)
Glucose-Capillary: 190 mg/dL — ABNORMAL HIGH (ref 70–99)

## 2023-10-09 LAB — CBC WITH DIFFERENTIAL/PLATELET
Abs Immature Granulocytes: 0.02 10*3/uL (ref 0.00–0.07)
Basophils Absolute: 0 10*3/uL (ref 0.0–0.1)
Basophils Relative: 1 %
Eosinophils Absolute: 0.6 10*3/uL — ABNORMAL HIGH (ref 0.0–0.5)
Eosinophils Relative: 14 %
HCT: 26.5 % — ABNORMAL LOW (ref 39.0–52.0)
Hemoglobin: 8.5 g/dL — ABNORMAL LOW (ref 13.0–17.0)
Immature Granulocytes: 1 %
Lymphocytes Relative: 31 %
Lymphs Abs: 1.3 10*3/uL (ref 0.7–4.0)
MCH: 32 pg (ref 26.0–34.0)
MCHC: 32.1 g/dL (ref 30.0–36.0)
MCV: 99.6 fL (ref 80.0–100.0)
Monocytes Absolute: 0.2 10*3/uL (ref 0.1–1.0)
Monocytes Relative: 5 %
Neutro Abs: 2.1 10*3/uL (ref 1.7–7.7)
Neutrophils Relative %: 48 %
Platelets: 338 10*3/uL (ref 150–400)
RBC: 2.66 MIL/uL — ABNORMAL LOW (ref 4.22–5.81)
RDW: 15.9 % — ABNORMAL HIGH (ref 11.5–15.5)
WBC: 4.2 10*3/uL (ref 4.0–10.5)
nRBC: 0 % (ref 0.0–0.2)

## 2023-10-09 LAB — HEPARIN ANTI-XA: Heparin LMW: 0.65 [IU]/mL

## 2023-10-09 LAB — BASIC METABOLIC PANEL
Anion gap: 7 (ref 5–15)
BUN: 12 mg/dL (ref 8–23)
CO2: 25 mmol/L (ref 22–32)
Calcium: 8 mg/dL — ABNORMAL LOW (ref 8.9–10.3)
Chloride: 105 mmol/L (ref 98–111)
Creatinine, Ser: 0.56 mg/dL — ABNORMAL LOW (ref 0.61–1.24)
GFR, Estimated: >60 mL/min (ref >60)
Glucose, Bld: 130 mg/dL — ABNORMAL HIGH (ref 70–99)
Potassium: 3.6 mmol/L (ref 3.5–5.1)
Sodium: 137 mmol/L (ref 135–145)

## 2023-10-09 LAB — PHOSPHORUS: Phosphorus: 1.9 mg/dL — ABNORMAL LOW (ref 2.5–4.6)

## 2023-10-09 LAB — MAGNESIUM: Magnesium: 1.6 mg/dL — ABNORMAL LOW (ref 1.7–2.4)

## 2023-10-09 MED ORDER — HYDROCORTISONE SOD SUC (PF) 100 MG IJ SOLR
100.0000 mg | Freq: Two times a day (BID) | INTRAMUSCULAR | Status: DC
  Administered 2023-10-09: 100 mg via INTRAVENOUS
  Filled 2023-10-09: qty 2

## 2023-10-09 MED ORDER — MAGNESIUM SULFATE 4 GM/100ML IV SOLN
4.0000 g | Freq: Once | INTRAVENOUS | Status: AC
  Administered 2023-10-09: 4 g via INTRAVENOUS
  Filled 2023-10-09: qty 100

## 2023-10-09 MED ORDER — HYDROCORTISONE SOD SUC (PF) 100 MG IJ SOLR
100.0000 mg | Freq: Every day | INTRAMUSCULAR | Status: DC
  Administered 2023-10-10: 100 mg via INTRAVENOUS
  Filled 2023-10-09 (×2): qty 2

## 2023-10-09 MED ORDER — POTASSIUM PHOSPHATES 15 MMOLE/5ML IV SOLN
30.0000 mmol | Freq: Once | INTRAVENOUS | Status: AC
  Administered 2023-10-09: 30 mmol via INTRAVENOUS
  Filled 2023-10-09: qty 10

## 2023-10-09 NOTE — Progress Notes (Signed)
 4 Days Post-Op   Subjective/Chief Complaint: No acute events.  Patient denies pain this morning   Objective: Vital signs in last 24 hours: Temp:  [97.4 F (36.3 C)-98.2 F (36.8 C)] 97.6 F (36.4 C) (02/22 2340) Pulse Rate:  [52-84] 71 (02/23 0700) Resp:  [11-27] 17 (02/23 0700) BP: (105-156)/(38-80) 141/53 (02/23 0700) SpO2:  [90 %-99 %] 96 % (02/23 0700) Arterial Line BP: (78-149)/(32-64) 119/45 (02/23 0700) Weight:  [161 kg] 112 kg (02/23 0500) Last BM Date : 10/08/23  Intake/Output from previous day: 02/22 0701 - 02/23 0700 In: 1263.4 [I.V.:481.1; IV Piggyback:782.3] Out: 3950 [Urine:2500; Stool:1450] Intake/Output this shift: Total I/O In: 123.7 [I.V.:20.3; IV Piggyback:103.4] Out: -   Resting comfortably Unlabored respirations Abdomen is soft, nontender, nondistended.  Ostomy is viable with thin liquid stool in the bag.  Wounds to left anterior and lateral thigh visualized and are clean and viable without any necrosis or purulent drainage  Lab Results:  Recent Labs    10/08/23 0458 10/09/23 0410  WBC 4.2 4.2  HGB 8.0* 8.5*  HCT 25.3* 26.5*  PLT 275 338   BMET Recent Labs    10/08/23 0458 10/09/23 0410  NA 133* 137  K 3.5 3.6  CL 100 105  CO2 25 25  GLUCOSE 116* 130*  BUN 12 12  CREATININE 0.80 0.56*  CALCIUM 7.8* 8.0*   PT/INR No results for input(s): "LABPROT", "INR" in the last 72 hours. ABG No results for input(s): "PHART", "HCO3" in the last 72 hours.  Invalid input(s): "PCO2", "PO2"  Studies/Results: No results found.  Anti-infectives: Anti-infectives (From admission, onward)    Start     Dose/Rate Route Frequency Ordered Stop   10/04/23 0900  linezolid (ZYVOX) IVPB 600 mg        600 mg 300 mL/hr over 60 Minutes Intravenous Every 12 hours 10/04/23 0809     10/04/23 0830  clindamycin (CLEOCIN) IVPB 900 mg  Status:  Discontinued        900 mg 100 mL/hr over 30 Minutes Intravenous Every 6 hours 10/04/23 0730 10/04/23 0809   09/27/23  1100  piperacillin-tazobactam (ZOSYN) IVPB 3.375 g        3.375 g 12.5 mL/hr over 240 Minutes Intravenous Every 8 hours 09/27/23 1052     09/27/23 0600  ceFEPIme (MAXIPIME) 2 g in sodium chloride 0.9 % 100 mL IVPB  Status:  Discontinued        2 g 200 mL/hr over 30 Minutes Intravenous Every 24 hours 09/27/23 0021 09/27/23 1025   09/27/23 0030  vancomycin (VANCOREADY) IVPB 2000 mg/400 mL        2,000 mg 200 mL/hr over 120 Minutes Intravenous  Once 09/27/23 0021 09/27/23 0333   09/27/23 0021  vancomycin variable dose per unstable renal function (pharmacist dosing)  Status:  Discontinued         Does not apply See admin instructions 09/27/23 0021 09/27/23 0416   09/26/23 2200  cefTRIAXone (ROCEPHIN) 2 g in sodium chloride 0.9 % 100 mL IVPB        2 g 200 mL/hr over 30 Minutes Intravenous  Once 09/26/23 2148 09/26/23 2323       Assessment/Plan: POD 11 s/p laparoscopic loop colostomy, excisional sharp debridement of sacral wound by Dr. Dossie Der on 09/28/23 POD 6/4 s/p excisional debridement of back, sacrum, hips and thighs for necrotizing fasciitis Dr. Michaell Cowing - Afebrile, white count 4.2, stable low-dose levo requirement at 3 - WOCN consult for new ostomy. Ostomy bridge removed POD5 -  Cont BID WTD dressings to wounds, hydrotherapy.  Continue specialty mattress, frequent turning, maximize nutrition as able.  Will plan to check wounds Monday unless there is a clinical decline over the weekend. - Cont abx. Cx with E. Coli, Enterococcus faecalis, Pseudomonas. - Appreciate CCM assistance    FEN - FLD. Would be hesistant to adv diet while still on pressors. k/phos/mag being replaced. IVF per primary  VTE - SCD's, okay for heparin gtt with no bolus  ID - Linezolid and Zosyn. Defer selection to primary  Foley - okay to remove from our standpoint   Anemia - s/p 1U 2/11, 2U 2/12, 2U 2/19. Hgb stable Aspiration PNA L femoral DVT - newly diagnosed this admission Multiple myeloma on lenalidomide   Wheelchair bound HLD GERD   LOS: 13 days    Berna Bue 10/09/2023

## 2023-10-09 NOTE — Progress Notes (Signed)
 PHARMACY - ANTICOAGULATION CONSULT NOTE  Pharmacy Consult for enoxaparin Indication: acute DVT  No Known Allergies  Patient Measurements: Height: 5\' 6"  (167.6 cm) Weight: 112 kg (247 lb) IBW/kg (Calculated) : 63.8  Vital Signs: Temp: 97.5 F (36.4 C) (02/23 0904) Temp Source: Oral (02/23 0904) BP: 114/48 (02/23 1130) Pulse Rate: 86 (02/23 1130)  Labs: Recent Labs    10/07/23 0329 10/08/23 0458 10/09/23 0410 10/09/23 0939  HGB 7.8* 8.0* 8.5*  --   HCT 23.6* 25.3* 26.5*  --   PLT 247 275 338  --   HEPARINUNFRC <0.10*  --   --   --   HEPRLOWMOCWT  --   --   --  0.65  CREATININE 0.69 0.80 0.56*  --     Estimated Creatinine Clearance: 92.3 mL/min (A) (by C-G formula based on SCr of 0.56 mg/dL (L)).   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 male with limited mobility (wheelchair- bound), multiple myeloma, and sacral wound who presented to the ED on 09/26/2023 with c/o generalized weakness, worsening of LE edema and weeping leg wounds.  LE doppler on 09/27/23 showed acute deep vein thrombosis involving the left  femoral vein. Anticoag. held off at the time due to low blood count.   Pharmacy was consulted to initiate heparin drip on 2/15. Heparin level was persistently undetectable. No further surgical intervention planned. D/w CCM, transitioned anticoagulation to enoxaparin on 2/21.   Significant events:  - 2/11: 1 unit PRBC - 2/12: 2 unit PRBC - 2/12: I&D of sacral wound with loop colostomy.  - 2/17: I&D of sacral wound.  - 2/19: OR for exploration, redebridement. Transfused - 2/21: Confirmed with CCS, no further surgical intervention planned  Today, 10/09/2023: Anti-Xa LMWH level = 0.65 is therapeutic after 4th dose of enoxaparin 120 mg subQ  q12h CBC: Hgb low but stable, Plt WNL Expect some oozing from extensive wounds SCr <1  Goal of Therapy:  Anti-Xa level 0.6 - 1 units/mL 4 hours after LMWH dose   Plan:  Continue enoxaparin 120 mg (1 mg/kg) subQ q12h Continue to monitor CBC, renal function Monitor for signs of bleeding.   Cindi Carbon, PharmD 10/09/23 12:09 PM

## 2023-10-09 NOTE — Evaluation (Signed)
 Physical Therapy Re-Evaluation Patient Details Name: Derek [name] MRN: [id number] DOB: 23-Jun-1946 Today's Date: 10/09/2023  History of Present Illness  Pt is a 78 year old male admitted on 09/27/23 with sepsis.  Pt s/p s/p laparoscopic loop colostomy, excisional sharp debridement of sacral wound by Dr. Dossie Der on 09/28/23.  Pt was left on vent for Afib/shock state postop.  Pt extubated 09/29/23.  Pt s/p extensive excisional debridement of back hip thighs and sacrum by Dr Michaell Cowing on 10/03/2023 and  then redebridement and repacking by Dr. Michaell Cowing on 10/05/2023.  PMH Includes but is not limited to ca, GERD, fall, HLD, L TKA, TLIF L4-l5, ACDF and periprosthetic ORIF of LLE on 12/05/2022. Recent diagnosis of chronic cervical and lower brainstem myelomalacia, on chemotherapy.  Clinical Impression  Pt admitted with above diagnosis.  Pt currently with functional limitations due to the deficits listed below (see PT Problem List). Pt will benefit from acute skilled PT to increase their independence and safety with mobility to allow discharge.  Pt familiar to this PT from previous evaluation and hydrotherapy.  RN in room performing dressing changes, so assisted with rolling pt as pt continues to require at least +2 assist for bed mobility.  Pt also continues to present with generalized weakness in UE and LEs (L > R) however overall appears similar in weakness to initial evaluation. Pt appears more lethargic today and RN reports pt sleeping a lot.  Due to extensive size and location of wounds, mobility will be challenging and likely limited to air mattress rolling, sitting EOB, and exercises.  No family present today.  Patient will benefit from continued inpatient follow up therapy, <3 hours/day.          If plan is discharge home, recommend the following: Two people to help with walking and/or transfers;Two people to help with bathing/dressing/bathroom   Can travel by private vehicle   No    Equipment  Recommendations Other (comment) (defer to next venue, if home will need pressure prevention such as w/c cushion and air mattress, lift)  Recommendations for Other Services       Functional Status Assessment Patient has had a recent decline in their functional status and demonstrates the ability to make significant improvements in function in a reasonable and predictable amount of time.     Precautions / Restrictions Precautions Precautions: Fall Precaution/Restrictions Comments: Arterial Line. Multiple Lines; Ostomy; remains on pressors      Mobility  Bed Mobility Overal bed mobility: Needs Assistance Bed Mobility: Rolling Rolling: Total assist, +2 for physical assistance         General bed mobility comments: pt not able to reach across midline to assist with upper body, reliant on total assist    Transfers                   General transfer comment: NT at this time, fatigue, weakness and pt remains on pressors, extensive wounds    Ambulation/Gait                  Stairs            Wheelchair Mobility     Tilt Bed    Modified Rankin (Stroke Patients Only)       Balance  Pertinent Vitals/Pain Pain Assessment Pain Assessment: No/denies pain Pain Intervention(s): Monitored during session, Repositioned, Premedicated before session    Home Living Family/patient expects to be discharged to:: Skilled nursing facility Living Arrangements: Spouse/significant other Available Help at Discharge: Friend(s);Available PRN/intermittently;Family;Personal care attendant Type of Home: House Home Access: Ramped entrance       Home Layout: One level Home Equipment: Rollator (4 wheels);Shower seat;BSC/3in1;Grab bars - tub/shower;Hand held shower head;Wheelchair - Pharmacist, hospital Comments: reports spouse has been assisting lately 1-2 weeks has been having difficulty  mobilizing independently   (Above from initial evaluation)   Prior Function Prior Level of Function : Needs assist;Driving       Physical Assist : Mobility (physical);ADLs (physical) Mobility (physical): Transfers;Bed mobility;Gait ADLs (physical): Bathing;Dressing;Toileting;IADLs Mobility Comments: pt reports using motorized scooter for home and community navigation and has been non ambulatory since 11/2022 following LLE fx and ORIF, pt states his wife helps him in and out of bed and swings him from the bed to the scooter and scooter to commode ADLs Comments: pt states that his wife helped him with all of his bathing, lower body dressing and IADLs.  (Above from initial evaluation)    Extremity/Trunk Assessment   Upper Extremity Assessment Upper Extremity Assessment: RUE deficits/detail RUE Deficits / Details: Edematous, wrist/hand with block for line. PROM to shoulder to ~90*, AAROM elbow: WFL. LUE Deficits / Details: AAROM with shoulder flexion limited to 90*, able to perform AROM elbow, weak grip    Lower Extremity Assessment Lower Extremity Assessment: Generalized weakness;RLE deficits/detail;LLE deficits/detail RLE Deficits / Details: grossly 2+/5 throughout (limited AROM against gravity) LLE Deficits / Details: grossly 2-/5 (muscle contractions observed against gravity however no AROM)    Cervical / Trunk Assessment Cervical / Trunk Exceptions: with previous left preference however able to remain midline posture in supine with assessing UE and LEs  Communication   Communication Communication: No apparent difficulties    Cognition Arousal: Lethargic Behavior During Therapy: WFL for tasks assessed/performed   PT - Cognitive impairments: Difficult to assess                       PT - Cognition Comments: pt not verbalizing during session, only responding occasionally with yes/no questions; family not present today; RN reports pt received pain meds but a couple hrs  prior Following commands: Impaired Following commands impaired: Follows one step commands with increased time, Follows one step commands inconsistently     Cueing Cueing Techniques: Verbal cues, Tactile cues     General Comments      Exercises     Assessment/Plan    PT Assessment Patient needs continued PT services  PT Problem List Decreased strength;Pain;Decreased activity tolerance;Decreased balance;Decreased mobility;Decreased knowledge of use of DME;Obesity;Decreased skin integrity;Decreased cognition       PT Treatment Interventions DME instruction;Balance training;Functional mobility training;Therapeutic activities;Therapeutic exercise;Wheelchair mobility training;Patient/family education;Neuromuscular re-education    PT Goals (Current goals can be found in the Care Plan section)  Acute Rehab PT Goals PT Goal Formulation: Patient unable to participate in goal setting Time For Goal Achievement: 10/23/23 Potential to Achieve Goals: Fair    Frequency Min 1X/week     Co-evaluation               AM-PAC PT "6 Clicks" Mobility  Outcome Measure Help needed turning from your back to your side while in a flat bed without using bedrails?: Total Help needed moving from lying on your back to sitting on the side  of a flat bed without using bedrails?: Total Help needed moving to and from a bed to a chair (including a wheelchair)?: Total Help needed standing up from a chair using your arms (e.g., wheelchair or bedside chair)?: Total Help needed to walk in hospital room?: Total Help needed climbing 3-5 steps with a railing? : Total 6 Click Score: 6    End of Session   Activity Tolerance: Patient limited by lethargy Patient left: in bed;with call bell/phone within reach;with nursing/sitter in room   PT Visit Diagnosis: Muscle weakness (generalized) (M62.81);Other abnormalities of gait and mobility (R26.89)    Time: 5009-3818 PT Time Calculation (min) (ACUTE ONLY): 27  min (RN performing dressing changes with rolling)   Charges:   PT Evaluation $PT Re-evaluation: 1 Re-eval   PT General Charges $$ ACUTE PT VISIT: 1 Visit        Thomasene Mohair PT, DPT Physical Therapist Acute Rehabilitation Services Office: [phone]   [name] 10/09/2023, 4:46 PM

## 2023-10-09 NOTE — Plan of Care (Signed)

## 2023-10-09 NOTE — Progress Notes (Signed)
 PT Hydro Cancellation Note  Patient Details Name: Derek [name] MRN: [id number] DOB: 08/20/1945   Cancelled Treatment:     New hydrotherapy order received.  Dr. Doylene Canard reports more full assessment of wounds by surgery team tomorrow.  Visualized wound today during nursing dressing change.  Pt does have an area of necrotic tissue in right sacral area which may benefit from further debridement, otherwise scattered nonviable tissue could be addressed with dressing changes.  Will attempt to coordinate with team during dressing change tomorrow to see surgery input/plan.   [name] 10/09/2023, 2:52 PM [name], DPT Physical Therapist Acute Rehabilitation Services Office: [phone]

## 2023-10-09 NOTE — Progress Notes (Signed)
 NAME:  Derek [name], MRN:  [id number], DOB:  May 14, 1946, LOS: 13 ADMISSION DATE:  09/26/2023, CONSULTATION DATE: 09/27/2023 REFERRING MD: Loetta Rough, MD, CHIEF COMPLAINT: hypotension   History of Present Illness:  A 78 y.o. male with multiple myeloma on chemotherapy, dyslipidemia, and GERD.  Brought in by family for increasing lower extremity edema, perineal weeping sores, and worsening generalized weakness for 1-2 weeks.  At baseline patient relatively independent.  He is experience rapid decline in function recently.  Per patient report because he is difficult to move he has been left in his urine and feces.  Per wife the patient experiences pain and does not want to be moved. Smoker. In ED, he was found to be hypotensive and given 2.5 L LR, Vanco, Rocephin, Cefepime, Levophed @ 7 mcg/min now, Rx for hyperK, and HTS for hypoNa.    Pertinent  Medical History  Multiple myeloma on chemotherapy, dyslipidemia, GERD  Significant Hospital Events: Including procedures, antibiotic start and stop dates in addition to other pertinent events   2/11 admit, CCS consult 2/12 Surgical Procedure:  LAPAROSCOPIC LOOP COLOSTOMY  EXCISIONAL SHARP DEBRIDEMENT OF 24 CM TALL X 16 CM WIDE X 4 CM DEEP SACRAL WOUND USING A SCALPEL AND ELECTROCAUTERY  Left on vent for Afib/shock state postop 2/13 extubated  2/15 on amiodarone, on Levophed 2/17 to OR for debridement, fourniers and nec fasc seen 2/19 return to OR for debridement. Hgb down to 6.8, 2u PRBC 2/21 remains on low dose pressors.   Interim History / Subjective:   Critically ill, remains on low-dose Levophed -higher at 5 mics Afebrile Good urine output 2.5 L  Objective   Blood pressure (!) 102/55, pulse 87, temperature (!) 97.5 F (36.4 C), temperature source Oral, resp. rate 18, height 5\' 6"  (1.676 m), weight 112 kg, SpO2 98%.        Intake/Output Summary (Last 24 hours) at 10/09/2023 1123 Last data filed at 10/09/2023 1111 Gross per 24 hour   Intake 2172.25 ml  Output 3200 ml  Net -1027.75 ml   Filed Weights   10/06/23 0438 10/08/23 0500 10/09/23 0500  Weight: 118.8 kg 115.7 kg 112 kg    Examination:  General: Elderly male resting comfortably in bed Neuro: Alert, oriented, non-focal HEENT: [place]/AT, PERRL, no JVD Cardiovascular: S1-S2 irregular Lungs: Clear bilateral breath sounds.  Abdomen: Soft, NT, ND. Stoma wnl with liquid stool.  Musculoskeletal: Surgical dressings in place on back and left thigh Skin: Multiple sacral/perineal wounds as per surgery  Labs show normal sodium, mild hypokalemia, hypophosphatemia and hypomagnesemia, no leukocytosis, stable anemia   Assessment & Plan:   Septic shock secondary to large necrotic sacral ulcer s/p debridement - cultures with rare E.coli, rare Enterococcus faecalis, rare Pseudomonas aeruginosa, few bacteriodes thetaiotaomicron Diverting ostomy 09/28/2023 -Wean Levophed to off, SBP 90 acceptable - Midodrine continue , added stress dose Solu-Cortef - Zosyn, linezolid will plan for 10 total days after source control. 3/3 tentative stop date.  - Surgery is hopeful he will not require any further intervention. Bedside dressing changes, ostomy care - WOC, hydrotherapy  Atrial fibrillation - Rate controlled  - Off Amiodarone -Continue heparin infusion  - trend H&H - Maintain K > 4, Mg > 2  Left femoral DVT - Restart Heparin, has been subtherapeutic so pharmacy working on increasing dose   Anasarca with volume overload Echo from 07/2023 with EF 60-65% -Auto diuresing , which again indicates that he is perfusing well, okay to DC Foley  Hypokalemia - s/p repletion Hypomagnesemia -  has remained low despite repletion past few days Hypophosphatemia - Replacing  K, Phos, Mg -Recheck  ABLA - presumed acute after wound debridement Thrombocytopenia - stable - Goal Hgb > 7 - Follow CBC  Wheelchair-bound at baseline Failure to thrive History of multiple myeloma on  chemotherapy - Appreciate palliative care follow-up, currently opting for aggressive measures. -PT eval   Best Practice (right click and "Reselect all SmartList Selections" daily)   Diet/type: Full liquid DVT prophylaxis: heparin infusion Pressure ulcer(s): present on admission   GI prophylaxis: oral ppi  Lines: Central line and Arterial Line Foley:  Yes, and it is still needed Code Status:  full code Last date of multidisciplinary goals of care discussion [palliative care medicine continues to follow]   CC time:  31 min.    Cyril Mourning MD. Tonny Bollman. [place] Pager : [phone]  If no response to pager , please call [phone] until 7 pm After 7:00 pm call Elink  [phone]     10/09/2023 11:23 AM

## 2023-10-09 NOTE — Plan of Care (Signed)
  Problem: Clinical Measurements: Goal: Diagnostic test results will improve Outcome: Progressing Goal: Respiratory complications will improve Outcome: Progressing Goal: Cardiovascular complication will be avoided Outcome: Progressing   Problem: Nutrition: Goal: Adequate nutrition will be maintained Outcome: Progressing   Problem: Coping: Goal: Level of anxiety will decrease Outcome: Progressing   Problem: Clinical Measurements: Goal: Ability to maintain clinical measurements within normal limits will improve Outcome: Not Progressing

## 2023-10-09 NOTE — Progress Notes (Signed)
 [facility] ADULT ICU REPLACEMENT PROTOCOL   The patient does apply for the [facility] Adult ICU Electrolyte Replacment Protocol based on the criteria listed below:   1.Exclusion criteria: TCTS, ECMO, Dialysis, and Myasthenia Gravis patients 2. Is GFR >/= 30 ml/min? Yes.    Patient's GFR today is >60 3. Is SCr </= 2? Yes.   Patient's SCr is 0.56 mg/dL 4. Did SCr increase >/= 0.5 in 24 hours? No. 5.Pt's weight >40kg  Yes.   6. Abnormal electrolyte(s): Phos, K, Mag  7. Electrolytes replaced per protocol 8.  Call MD STAT for K+ </= 2.5, Phos </= 1, or Mag </= 1 Physician:  Flossie Buffy E Derrick Tiegs 10/09/2023 5:19 AM

## 2023-10-10 DIAGNOSIS — M726 Necrotizing fasciitis: Secondary | ICD-10-CM | POA: Diagnosis not present

## 2023-10-10 LAB — CBC WITH DIFFERENTIAL/PLATELET
Abs Immature Granulocytes: 0.03 10*3/uL (ref 0.00–0.07)
Basophils Absolute: 0 10*3/uL (ref 0.0–0.1)
Basophils Relative: 1 %
Eosinophils Absolute: 0.1 10*3/uL (ref 0.0–0.5)
Eosinophils Relative: 1 %
HCT: 24.3 % — ABNORMAL LOW (ref 39.0–52.0)
Hemoglobin: 7.7 g/dL — ABNORMAL LOW (ref 13.0–17.0)
Immature Granulocytes: 1 %
Lymphocytes Relative: 39 %
Lymphs Abs: 2.2 10*3/uL (ref 0.7–4.0)
MCH: 31.3 pg (ref 26.0–34.0)
MCHC: 31.7 g/dL (ref 30.0–36.0)
MCV: 98.8 fL (ref 80.0–100.0)
Monocytes Absolute: 0.3 10*3/uL (ref 0.1–1.0)
Monocytes Relative: 5 %
Neutro Abs: 2.9 10*3/uL (ref 1.7–7.7)
Neutrophils Relative %: 53 %
Platelets: 322 10*3/uL (ref 150–400)
RBC: 2.46 MIL/uL — ABNORMAL LOW (ref 4.22–5.81)
RDW: 16 % — ABNORMAL HIGH (ref 11.5–15.5)
WBC: 5.5 10*3/uL (ref 4.0–10.5)
nRBC: 0 % (ref 0.0–0.2)

## 2023-10-10 LAB — BASIC METABOLIC PANEL
Anion gap: 8 (ref 5–15)
BUN: 18 mg/dL (ref 8–23)
CO2: 26 mmol/L (ref 22–32)
Calcium: 8.3 mg/dL — ABNORMAL LOW (ref 8.9–10.3)
Chloride: 105 mmol/L (ref 98–111)
Creatinine, Ser: 0.67 mg/dL (ref 0.61–1.24)
GFR, Estimated: >60 mL/min (ref >60)
Glucose, Bld: 122 mg/dL — ABNORMAL HIGH (ref 70–99)
Potassium: 4 mmol/L (ref 3.5–5.1)
Sodium: 139 mmol/L (ref 135–145)

## 2023-10-10 LAB — GLUCOSE, CAPILLARY
Glucose-Capillary: 87 mg/dL (ref 70–99)
Glucose-Capillary: 171 mg/dL — ABNORMAL HIGH (ref 70–99)
Glucose-Capillary: 167 mg/dL — ABNORMAL HIGH (ref 70–99)
Glucose-Capillary: 133 mg/dL — ABNORMAL HIGH (ref 70–99)

## 2023-10-10 LAB — MAGNESIUM: Magnesium: 2.1 mg/dL (ref 1.7–2.4)

## 2023-10-10 LAB — PHOSPHORUS: Phosphorus: 2.2 mg/dL — ABNORMAL LOW (ref 2.5–4.6)

## 2023-10-10 MED ORDER — ORAL CARE MOUTH RINSE
15.0000 mL | OROMUCOSAL | Status: DC
  Administered 2023-10-10 – 2023-10-17 (×21): 15 mL via OROMUCOSAL

## 2023-10-10 MED ORDER — ORAL CARE MOUTH RINSE: 15.0000 mL | OROMUCOSAL | Status: DC

## 2023-10-10 MED ORDER — ORAL CARE MOUTH RINSE: 15.0000 mL | OROMUCOSAL | Status: DC | PRN

## 2023-10-10 MED ORDER — SODIUM PHOSPHATES 45 MMOLE/15ML IV SOLN
15.0000 mmol | Freq: Once | INTRAVENOUS | Status: AC
  Administered 2023-10-10: 15 mmol via INTRAVENOUS
  Filled 2023-10-10: qty 5

## 2023-10-10 MED ORDER — SODIUM CHLORIDE 0.9 % IV BOLUS
500.0000 mL | Freq: Once | INTRAVENOUS | Status: AC
  Administered 2023-10-10: 500 mL via INTRAVENOUS

## 2023-10-10 NOTE — Progress Notes (Signed)
 5 Days Post-Op   Subjective/Chief Complaint: No acute events.  Patient denies pain this morning On midodrine and low dose levo (3 mcg) WBC 5.5  Hgb 7.7  On Zosyn and linezolid  Objective: Vital signs in last 24 hours: Temp:  [97.7 F (36.5 C)-98.9 F (37.2 C)] 98.4 F (36.9 C) (02/24 0339) Pulse Rate:  [64-93] 82 (02/23 1900) Resp:  [11-26] 18 (02/24 0628) BP: (102-158)/(28-78) 158/54 (02/24 0628) SpO2:  [92 %-99 %] 93 % (02/23 1900) Arterial Line BP: (85-112)/(43-83) 85/83 (02/23 1030) Weight:  [161 kg] 113 kg (02/24 0700) Last BM Date : 10/10/23  Intake/Output from previous day: 02/23 0701 - 02/24 0700 In: 1409.6 [I.V.:72.5; IV Piggyback:1337.1] Out: 1800 [Urine:1200; Stool:600] Intake/Output this shift: No intake/output data recorded.  Resting comfortably Unlabored respirations Abdomen is soft, nontender, nondistended.  Ostomy is viable with gas and nonbloody stool in the bag.  Wounds as below:   LEFT ANTERIOR THIGH/FLANK -- interval decrease in cellulitis. Superior lateral wound tracks posteriorly towards buttock, cephalad towards the flank, as well as towards the counter incision on anterior thigh.    SACRUM/BUTTOCKS - picture below in right lateral decubitus. Overall improved. There is significant undermining beneath the remaining skin of the left buttock (my entire hand fits underneath this flap of skin). There is tunneling cephalad and right lateral There is some fat necrosis but no evidence of ongoing necrotizing infection. Not pictured is a roughly 4x1x5 cm counter incision on the left buttock.      Lab Results:  Recent Labs    10/09/23 0410 10/10/23 0327  WBC 4.2 5.5  HGB 8.5* 7.7*  HCT 26.5* 24.3*  PLT 338 322   BMET Recent Labs    10/09/23 0410 10/10/23 0327  NA 137 139  K 3.6 4.0  CL 105 105  CO2 25 26  GLUCOSE 130* 122*  BUN 12 18  CREATININE 0.56* 0.67  CALCIUM 8.0* 8.3*   PT/INR No results for input(s): "LABPROT", "INR" in the last  72 hours. ABG No results for input(s): "PHART", "HCO3" in the last 72 hours.  Invalid input(s): "PCO2", "PO2"  Studies/Results: No results found.  Anti-infectives: Anti-infectives (From admission, onward)    Start     Dose/Rate Route Frequency Ordered Stop   10/04/23 0900  linezolid (ZYVOX) IVPB 600 mg        600 mg 300 mL/hr over 60 Minutes Intravenous Every 12 hours 10/04/23 0809     10/04/23 0830  clindamycin (CLEOCIN) IVPB 900 mg  Status:  Discontinued        900 mg 100 mL/hr over 30 Minutes Intravenous Every 6 hours 10/04/23 0730 10/04/23 0809   09/27/23 1100  piperacillin-tazobactam (ZOSYN) IVPB 3.375 g        3.375 g 12.5 mL/hr over 240 Minutes Intravenous Every 8 hours 09/27/23 1052     09/27/23 0600  ceFEPIme (MAXIPIME) 2 g in sodium chloride 0.9 % 100 mL IVPB  Status:  Discontinued        2 g 200 mL/hr over 30 Minutes Intravenous Every 24 hours 09/27/23 0021 09/27/23 1025   09/27/23 0030  vancomycin (VANCOREADY) IVPB 2000 mg/400 mL        2,000 mg 200 mL/hr over 120 Minutes Intravenous  Once 09/27/23 0021 09/27/23 0333   09/27/23 0021  vancomycin variable dose per unstable renal function (pharmacist dosing)  Status:  Discontinued         Does not apply See admin instructions 09/27/23 0021 09/27/23 0416   09/26/23  2200  cefTRIAXone (ROCEPHIN) 2 g in sodium chloride 0.9 % 100 mL IVPB        2 g 200 mL/hr over 30 Minutes Intravenous  Once 09/26/23 2148 09/26/23 2323       Assessment/Plan: POD 11 s/p laparoscopic loop colostomy, excisional sharp debridement of sacral wound by Dr. Dossie Der on 09/28/23 POD 6/4 s/p excisional debridement of back, sacrum, hips and thighs for necrotizing fasciitis Dr. Michaell Cowing - Afebrile, white count 5.5, stable low-dose levo requirement at 3 - WOCN consult for new ostomy. Ostomy bridge removed POD5 - Cont daily WTD dressings to wounds, hydrotherapy.  Continue specialty mattress, frequent turning, maximize nutrition as able.  Will plan to  check wounds Thursday unless there is a clinical decline over the weekend. - Cont abx. Cx with E. Coli, Enterococcus faecalis, Pseudomonas. - Appreciate CCM assistance    FEN - FLD. Will discuss diet advancement with MD -- ostomy productive but still on low dose pressor.  VTE - SCD's, okay for heparin gtt with no bolus; currently on lovenox  ID - Linezolid and Zosyn. Defer selection to primary  Foley - okay to remove from our standpoint   Anemia - s/p 1U 2/11, 2U 2/12, 2U 2/19. Hgb stable  Aspiration PNA L femoral DVT - newly diagnosed this admission Multiple myeloma on lenalidomide  Wheelchair bound HLD GERD   LOS: 14 days    Adam Phenix 10/10/2023

## 2023-10-10 NOTE — Hospital Course (Addendum)
 78 year old man complex hospitalization admitted 2/10.  PMH includes multiple myeloma on chemotherapy.  Presented with generalized weakness, weeping sores, increasing lower extremity edema.  Admitted for hyperkalemia, AKI.  Rapidly decompensated and was transferred to ICU and care taken over by critical care for septic shock with multiorgan failure in wheelchair-bound patient with severe leg wounds, third spacing, and multiple myeloma on chemo.  He was quickly seen by general surgery for necrotic sacral, perineal, scrotal and posterior thigh pressure wounds.  Underwent diverting loop colostomy, and debridement of wounds.  Developed necrotizing fasciitis and underwent multiple debridements.  At this point remains on vasopressors for hypotension.  Has been referred to [facility]  Consultants PCCM 2/11 Palliative medicine 2/11 General Surgery 2/11 Infectious disease 2/25  Procedures/Events 2/11 Admit, CCS consult 2/12 LAPAROSCOPIC LOOP COLOSTOMY, EXCISIONAL SHARP DEBRIDEMENT OF 24 CM TALL X 16 CM WIDE X 4 CM DEEP SACRAL WOUND USING A SCALPEL AND ELECTROCAUTERY; Left on vent for Afib/shock state postop.  Perioperative atrial fibrillation.   2/13 Extubated  2/15 atrial fibrillation recurred.  On amiodarone, on Levophed 2/17 To OR for debridement.  Status post excisional debridement of back, sacrum, hips, thighs.  Postoperative diagnosis necrotizing fasciitis and Fournier's gangrene left anterior/posterior thigh, right posterior thigh, sacrum, back, left gluteus 2/19 Return to OR for debridement of above.  Hgb down to 6.8, 2u PRBC 2/21 Remains on low dose pressors 2/24 transferred to [facility] 2/26 PCCM reconsulted for low-dose vasopressor requirements

## 2023-10-10 NOTE — Progress Notes (Signed)
 PROGRESS NOTE Derek [name]  ZOX:[id number] DOB: [birth year] DOA: 09/26/2023 PCP: [facility]  Brief Narrative/Hospital Course: 78 y.o. male with multiple myeloma on chemotherapy, dyslipidemia, and GERD. Brought in by family for increasing lower extremity edema, perineal weeping sores, and worsening generalized weakness for 1-2 weeks. At baseline patient relatively independent. He has experienced rapid decline in function recently. Per patient report because he is difficult to move he has been left in his urine and feces. Per wife the patient experiences pain and does not want to be moved. In ED, he was found to be hypotensive and given 2.5 L LR, Vanco, Rocephin, Cefepime given and started on Levophed, also managed for hyperkalemia and admitted to ICU 2/11, CCS was consulted s/p laparoscopic loop colostomy excision/debridement, left on vent for A-fib/shock postop.  Subsequently extubated 2/13 managed and amiodarone, Levophed, went to OR 2/17 for debridement and noted to have necrotizing for nursing gangrene.  Return to the OR again 2/19 for debridement, hemoglobin dropped to 6.8 received unit PRBC.  2/20 on remained on low-dose pressors, weaned off pressors and managed on midodrine, stress dose Solu-Cortef. OR Cultures growing E. coli, rare Enterococcus faecalis and rare Pseudomonas and few bacteria it is. 2/24 transferred to [facility] service  Overnight afebrile BP stable 100s-150s.  Labs reviewed mild low phosphorus 2.2 stable renal function, hemoglobin slightly drifting down 7.7 mg WBC normal 5.5.  Septic shock secondary to large necrotic sacral ulcer s/p debridement S/P diverting ostomy 2/12, and multiple debridement in OR 2/17, 2/19: Weaned off Levophed.  On midodrine, stress dose steroid.  CCS following continue wound care, Intra-op culture 09/28/23>with E. coli, Enterococcus faecalis and Pseudomonas aeruginosa and few bacteroids-on Zosyn, linezolid planning for 10 days course after source  control EOT tentatively 3/3. Continue wound care, hydrotherapy and plan of care as per surgery  ABLA Chronic anemia with history of myeloma: Hb as low as 6.8, received multiple blood transfusions.  Baseline hemoglobin ~ 9 gm.  Monitor and transfuse if less than 7  A-fib: Rate controlled, on heparin infusion, monitor H&H  Left femoral DVT: Continue heparin drip.  Pharmacy managing  Anasarca with volume overload Hypoalbuminemia Echo with normal EF 07/2023.  Auto diuresing.  Monitor fluid status and daily weight.  Hypokalemia Hypomagnesemia HypoPhosphatemia: Resolved.  Monitor electrolytes and replace.  Failure to thrive Debility/deconditioning-wheelchair bound at baseline History of multiple myeloma on chemotherapy: Continue supportive care, palliative care input appreciated at this time opting for aggressive measures.  Continue PT OT     Subjective: Seen and examined Alert awake oriented, able to move upper extremities or unable to move lower legs Foley in place, colostomy with stool + Overnight afebrile BP stable 100s-150s.  Labs reviewed mild low phosphorus 2.2 stable renal function, hemoglobin slightly drifting down 7.7 mg WBC normal 5.5.    Assessment and Plan: Principal Problem:   Necrotizing fasciitis of multiple sites [facility]) Active Problems:   Hyponatremia   Decubitus ulcer of sacral region   Hyperkalemia   AKI (acute kidney injury) (HCC)   Metabolic acidosis   Anorexia   Septic shock (HCC)   Palliative care encounter   Peripheral edema   Pressure injury of contiguous region involving back, buttock, and hip, stage 2 (HCC)   Pressure injury of left thigh, stage 2 (HCC)   Counseling and coordination of care   Need for emotional support   Hypomagnesemia   Atrial fibrillation (HCC)   Septic shock secondary to large necrotic sacral ulcer s/p debridement S/P diverting ostomy 2/12, and  multiple debridement in OR 2/17, 2/19 (S/p EXPLORATORATION OF BACK, HIPS,  THIGHS (N/A), REDEBRIDEMENT WOUND (Left), DRESSING CHANGE UNDER ANESTHESIA (N/A):  Weaned off Levophed.  On midodrine, stress dose steroid.  CCS following continue wound care, Intra-op culture 09/28/23>with E. coli, Enterococcus faecalis and Pseudomonas aeruginosa and few bacteroids-on Zosyn, linezolid planning for 10 days course after source control EOT tentatively 3/3. Continue wound care, hydrotherapy and plan of care as per surgery  ABLA Chronic anemia with history of myeloma: Hb as low as 6.8, received multiple blood transfusions.  Baseline hemoglobin ~ 9 gm.  Monitor and transfuse if less than 7 Recent Labs  Lab 10/06/23 0434 10/07/23 0329 10/08/23 0458 10/09/23 0410 10/10/23 0327  HGB 8.7* 7.8* 8.0* 8.5* 7.7*  HCT 26.5* 23.6* 25.3* 26.5* 24.3*    A-fib: Rate controlled, on heparin infusion, monitor H&H  Left femoral DVT: Continue heparin drip.  Pharmacy managing  Anasarca with volume overload Hypoalbuminemia Echo with normal EF 07/2023.  Auto diuresing.  Monitor fluid status and daily weight.  Hypokalemia Hypomagnesemia HypoPhosphatemia: Resolved.  Monitor electrolytes and replace. Recent Labs  Lab 10/06/23 0434 10/07/23 0329 10/08/23 0458 10/09/23 0410 10/10/23 0327  K 3.6 3.5 3.5 3.6 4.0  CALCIUM 7.7* 7.6* 7.8* 8.0* 8.3*  MG 1.6* 1.7 1.7 1.6* 2.1  PHOS 2.6 1.7* 2.3* 1.9* 2.2*   Failure to thrive Debility/deconditioning-wheelchair bound at baseline History of multiple myeloma on chemotherapy: Continue supportive care, palliative care input appreciated at this time opting for aggressive measures.  Continue PT OT  Morbid Obesity: Patient's Body mass index is 40.21 kg/m. : Will benefit with PCP follow-up, weight loss  healthy lifestyle  DVT prophylaxis: SCDs Start: 09/27/23 0602 Code Status:   Code Status: Full Code Family Communication: plan of care discussed with patient at bedside. Patient status is: Remains hospitalized because of severity of  illness Level of care: ICU   Dispo: The patient is from: Home w/ wife            Anticipated disposition: TBD Objective: Vitals last 24 hrs: Vitals:   10/10/23 0700 10/10/23 0800 10/10/23 0833 10/10/23 0909  BP: (!) 115/37 (!) 137/41  (!) 129/47  Pulse:  70 69 66  Resp: 12 12 18 13   Temp:  97.9 F (36.6 C)    TempSrc:  Oral    SpO2:  97% 95% 94%  Weight: 113 kg     Height:       Weight change: 0.962 kg  Physical Examination: General exam: alert awake,at baseline, older than stated age HEENT:Oral mucosa moist, Ear/Nose WNL grossly Respiratory system: Bilaterally clear BS,no use of accessory muscle Cardiovascular system: S1 & S2 +, No JVD. Gastrointestinal system: Abdomen soft, colostomy present with stool output, Foley in place ,ND, BS+ Nervous System: Alert, awake, moving UE but unable to move LE Extremities: LE edema neg,distal peripheral pulses palpable and warm.  Skin: No rashes,no icterus. MSK: Normal muscle bulk,tone, power  Multiple  WOUND dressing see picture   Medications reviewed:  Scheduled Meds:  acetaminophen  1,000 mg Oral Q6H   ascorbic acid  500 mg Oral BID   Chlorhexidine Gluconate Cloth  6 each Topical Q2200   enoxaparin (LOVENOX) injection  1 mg/kg Subcutaneous Q12H   feeding supplement  237 mL Oral TID BM   hydrocortisone sod succinate (SOLU-CORTEF) inj  100 mg Intravenous Daily   insulin aspart  1-3 Units Subcutaneous TID AC & HS   leptospermum manuka honey  1 Application Topical BID   midodrine  10 mg Oral TID WC   multivitamin with minerals  1 tablet Oral Daily   mouth rinse  15 mL Mouth Rinse 4 times per day   polycarbophil  625 mg Oral BID   zinc sulfate (50mg  elemental zinc)  220 mg Oral Daily   Continuous Infusions:  linezolid (ZYVOX) IV 600 mg (10/10/23 0920)   norepinephrine (LEVOPHED) Adult infusion Stopped (10/09/23 1059)   piperacillin-tazobactam (ZOSYN)  IV Stopped (10/10/23 0719)   sodium phosphate 15 mmol in sodium chloride 0.9 %  250 mL infusion 43 mL/hr at 10/10/23 1610   Pressure Injury 09/27/23 Heel Left Stage 1 -  Intact skin with non-blanchable redness of a localized area usually over a bony prominence. (Active)  09/27/23 0947  Location: Heel  Location Orientation: Left  Staging: Stage 1 -  Intact skin with non-blanchable redness of a localized area usually over a bony prominence.  Wound Description (Comments):   Present on Admission: Yes  Dressing Type None 10/09/23 2000   Diet Order             Diet full liquid Room service appropriate? Yes; Fluid consistency: Thin  Diet effective now                    Nutrition Problem: Increased nutrient needs Etiology: cancer and cancer related treatments, wound healing Signs/Symptoms: estimated needs Interventions: Refer to RD note for recommendations   Intake/Output Summary (Last 24 hours) at 10/10/2023 0943 Last data filed at 10/10/2023 9604 Gross per 24 hour  Intake 972.11 ml  Output 1800 ml  Net -827.89 ml   Net IO Since Admission: 138.36 mL [10/10/23 0943]  Wt Readings from Last 3 Encounters:  10/10/23 113 kg  07/30/23 115.6 kg  04/27/23 106.6 kg     Unresulted Labs (From admission, onward)    None      Data Reviewed: I have personally reviewed following labs and imaging studies CBC: Recent Labs  Lab 10/06/23 0434 10/07/23 0329 10/08/23 0458 10/09/23 0410 10/10/23 0327  WBC 2.4* 3.9* 4.2 4.2 5.5  NEUTROABS  --   --   --  2.1 2.9  HGB 8.7* 7.8* 8.0* 8.5* 7.7*  HCT 26.5* 23.6* 25.3* 26.5* 24.3*  MCV 95.7 94.0 97.7 99.6 98.8  PLT 191 247 275 338 322   Basic Metabolic Panel:  Recent Labs  Lab 10/06/23 0434 10/07/23 0329 10/08/23 0458 10/09/23 0410 10/10/23 0327  NA 131* 131* 133* 137 139  K 3.6 3.5 3.5 3.6 4.0  CL 101 96* 100 105 105  CO2 22 24 25 25 26   GLUCOSE 129* 133* 116* 130* 122*  BUN 16 15 12 12 18   CREATININE 0.77 0.69 0.80 0.56* 0.67  CALCIUM 7.7* 7.6* 7.8* 8.0* 8.3*  MG 1.6* 1.7 1.7 1.6* 2.1  PHOS 2.6 1.7*  2.3* 1.9* 2.2*   GFR: Estimated Creatinine Clearance: 92.8 mL/min (by C-G formula based on SCr of 0.67 mg/dL). No results for input(s): "HGBA1C" in the last 72 hours. Recent Labs  Lab 10/09/23 0756 10/09/23 1126 10/09/23 1637 10/09/23 2137 10/10/23 0754  GLUCAP 99 189* 124* 190* 87   No results found for this or any previous visit (from the past 240 hours).  Antimicrobials/Microbiology: Anti-infectives (From admission, onward)    Start     Dose/Rate Route Frequency Ordered Stop   10/04/23 0900  linezolid (ZYVOX) IVPB 600 mg        600 mg 300 mL/hr over 60 Minutes Intravenous Every 12 hours 10/04/23 0809  10/04/23 0830  clindamycin (CLEOCIN) IVPB 900 mg  Status:  Discontinued        900 mg 100 mL/hr over 30 Minutes Intravenous Every 6 hours 10/04/23 0730 10/04/23 0809   09/27/23 1100  piperacillin-tazobactam (ZOSYN) IVPB 3.375 g        3.375 g 12.5 mL/hr over 240 Minutes Intravenous Every 8 hours 09/27/23 1052     09/27/23 0600  ceFEPIme (MAXIPIME) 2 g in sodium chloride 0.9 % 100 mL IVPB  Status:  Discontinued        2 g 200 mL/hr over 30 Minutes Intravenous Every 24 hours 09/27/23 0021 09/27/23 1025   09/27/23 0030  vancomycin (VANCOREADY) IVPB 2000 mg/400 mL        2,000 mg 200 mL/hr over 120 Minutes Intravenous  Once 09/27/23 0021 09/27/23 0333   09/27/23 0021  vancomycin variable dose per unstable renal function (pharmacist dosing)  Status:  Discontinued         Does not apply See admin instructions 09/27/23 0021 09/27/23 0416   09/26/23 2200  cefTRIAXone (ROCEPHIN) 2 g in sodium chloride 0.9 % 100 mL IVPB        2 g 200 mL/hr over 30 Minutes Intravenous  Once 09/26/23 2148 09/26/23 2323         Component Value Date/Time   SDES TISSUE 09/28/2023 1137   SPECREQUEST SACRAL WOUND 09/28/2023 1137   CULT  09/28/2023 1137    RARE ESCHERICHIA COLI RARE ENTEROCOCCUS FAECALIS RARE PSEUDOMONAS AERUGINOSA FEW BACTEROIDES THETAIOTAOMICRON BETA LACTAMASE POSITIVE Performed  at [facility], [address] [place], [place] [address]    REPTSTATUS 10/02/2023 FINAL 09/28/2023 1137     Radiology Studies: No results found.   LOS: 14 days   Total time spent in review of labs and imaging, patient evaluation, formulation of plan, documentation and communication with family: 50 minutes  Lanae Boast, MD  [facility]  10/10/2023, 9:43 AM

## 2023-10-10 NOTE — Progress Notes (Signed)
 [facility] ADULT ICU REPLACEMENT PROTOCOL   The patient does apply for the [facility] Adult ICU Electrolyte Replacment Protocol based on the criteria listed below:   1.Exclusion criteria: TCTS, ECMO, Dialysis, and Myasthenia Gravis patients 2. Is GFR >/= 30 ml/min? Yes. Patient's GFR today is >60 3. Is SCr </= 2? Yes.   Patient's SCr is 0.67 mg/dL 4. Did SCr increase >/= 0.5 in 24 hours? No. 5.Pt's weight >40kg  Yes.   6. Abnormal electrolyte(s): Phos  7. Electrolytes replaced per protocol 8.  Call MD STAT for K+ </= 2.5, Phos </= 1, or Mag </= 1 Physician:  Alpha Gula Challen Spainhour 10/10/2023 5:22 AM

## 2023-10-10 NOTE — Consult Note (Signed)
 WOC ostomy follow-up: Surgical team following for assessment and plan of care for sacral and buttock wounds.   No family present during pouch change; Pt is in ICU and watched the procedure but did not participate. Stoma is red and viable, 2 1/4 inches and edematous, slightly above skin level.  Applied barrier ring and 2 piece pouching system. 4 extra supplies left at the bedside. Use Supplies: barrier ring Hart [place] # [id number] and wafer Hart [place] # 2 and pouch Lawson # [id number] Emptied 100cc liquid brown stool.  Enrolled patient in [place] Secure Start Discharge program: Yes; previously Pt and family will need further teaching sessions when he is stable and out of ICU. Thank-you,  Cammie Mcgee MSN, RN, CWOCN, [place], CNS [phone]

## 2023-10-10 NOTE — Progress Notes (Signed)
 PT  Note  Patient Details Name: Derek [name] MRN: [id number] DOB: [birth year]   Cancelled Treatment:    Reason Eval/Treat Not Completed: Other (comment) per discussion with surgery team will plan to start hydrotherapy and bedside debridement (as needed) 10/10/23.  Dressing change done earlier today by RN and PA.  Delice Bison, PT  Acute Rehab Dept [facility]) [phone]  10/10/2023    [facility] 10/10/2023, 10:01 AM

## 2023-10-11 DIAGNOSIS — Z7189 Other specified counseling: Secondary | ICD-10-CM | POA: Diagnosis not present

## 2023-10-11 DIAGNOSIS — B9620 Unspecified Escherichia coli [E. coli] as the cause of diseases classified elsewhere: Secondary | ICD-10-CM | POA: Diagnosis not present

## 2023-10-11 DIAGNOSIS — B952 Enterococcus as the cause of diseases classified elsewhere: Secondary | ICD-10-CM | POA: Diagnosis not present

## 2023-10-11 DIAGNOSIS — L8942 Pressure ulcer of contiguous site of back, buttock and hip, stage 2: Secondary | ICD-10-CM | POA: Diagnosis not present

## 2023-10-11 DIAGNOSIS — M726 Necrotizing fasciitis: Secondary | ICD-10-CM | POA: Diagnosis not present

## 2023-10-11 DIAGNOSIS — R630 Anorexia: Secondary | ICD-10-CM | POA: Diagnosis not present

## 2023-10-11 DIAGNOSIS — Z515 Encounter for palliative care: Secondary | ICD-10-CM | POA: Diagnosis not present

## 2023-10-11 LAB — CBC
HCT: 23.4 % — ABNORMAL LOW (ref 39.0–52.0)
Hemoglobin: 7.2 g/dL — ABNORMAL LOW (ref 13.0–17.0)
MCH: 31.2 pg (ref 26.0–34.0)
MCHC: 30.8 g/dL (ref 30.0–36.0)
MCV: 101.3 fL — ABNORMAL HIGH (ref 80.0–100.0)
Platelets: 336 10*3/uL (ref 150–400)
RBC: 2.31 MIL/uL — ABNORMAL LOW (ref 4.22–5.81)
RDW: 16 % — ABNORMAL HIGH (ref 11.5–15.5)
WBC: 5.4 10*3/uL (ref 4.0–10.5)
nRBC: 0 % (ref 0.0–0.2)

## 2023-10-11 LAB — GLUCOSE, CAPILLARY
Glucose-Capillary: 86 mg/dL (ref 70–99)
Glucose-Capillary: 86 mg/dL (ref 70–99)
Glucose-Capillary: 89 mg/dL (ref 70–99)
Glucose-Capillary: 118 mg/dL — ABNORMAL HIGH (ref 70–99)

## 2023-10-11 LAB — BASIC METABOLIC PANEL
Anion gap: 9 (ref 5–15)
BUN: 22 mg/dL (ref 8–23)
CO2: 27 mmol/L (ref 22–32)
Calcium: 8.6 mg/dL — ABNORMAL LOW (ref 8.9–10.3)
Chloride: 105 mmol/L (ref 98–111)
Creatinine, Ser: 0.59 mg/dL — ABNORMAL LOW (ref 0.61–1.24)
GFR, Estimated: >60 mL/min (ref >60)
Glucose, Bld: 90 mg/dL (ref 70–99)
Potassium: 3.8 mmol/L (ref 3.5–5.1)
Sodium: 141 mmol/L (ref 135–145)

## 2023-10-11 MED ORDER — LORAZEPAM 2 MG/ML IJ SOLN
0.5000 mg | Freq: Once | INTRAMUSCULAR | Status: AC
  Administered 2023-10-11: 0.5 mg via INTRAVENOUS
  Filled 2023-10-11: qty 1

## 2023-10-11 MED ORDER — TRAZODONE HCL 50 MG PO TABS
50.0000 mg | ORAL_TABLET | Freq: Every day | ORAL | Status: DC
  Administered 2023-10-11 – 2023-10-16 (×5): 50 mg via ORAL
  Filled 2023-10-11 (×6): qty 1

## 2023-10-11 NOTE — NC FL2 (Signed)
 [place] MEDICAID FL2 LEVEL OF CARE FORM     IDENTIFICATION  Patient Name: Derek [name] Birthdate: 12/19/1945 Sex: male Admission Date (Current Location): 09/26/2023  [facility] and [place] Number:  Producer, television/film/video and Address:  [facility],  [address] [place], [place] [address]      Provider Number: [id number]  Attending Physician Name and Address:  Lanae Boast, MD  Relative Name and Phone Number:  Kaizer, Dissinger (Spouse)  [phone]    Current Level of Care: Hospital Recommended Level of Care: Skilled Nursing Facility Prior Approval Number:    Date Approved/Denied:   PASRR Number: [id number]  Discharge Plan: SNF    Current Diagnoses: Patient Active Problem List   Diagnosis Date Noted   Necrotizing fasciitis of multiple sites (HCC) 10/06/2023   Hypomagnesemia 10/05/2023   Atrial fibrillation (HCC) 10/05/2023   Septic shock (HCC) 09/27/2023   Palliative care encounter 09/27/2023   Peripheral edema 09/27/2023   Pressure injury of contiguous region involving back, buttock, and hip, stage 2 (HCC) 09/27/2023   Pressure injury of left thigh, stage 2 (HCC) 09/27/2023   Counseling and coordination of care 09/27/2023   Need for emotional support 09/27/2023   Hyponatremia 09/26/2023   Decubitus ulcer of sacral region 09/26/2023   Hyperkalemia 09/26/2023   AKI (acute kidney injury) (HCC) 09/26/2023   Metabolic acidosis 09/26/2023   Anorexia 09/26/2023   TIA (transient ischemic attack) 07/29/2023   Closed fracture of left distal femur (HCC) 12/04/2022   Spondylolisthesis, lumbar region 12/22/2021   Lumbar stenosis 10/20/2021   Stenosis of cervical spine with myelopathy (HCC) 05/06/2021   Status post total left knee replacement 07/22/2020   Unilateral primary osteoarthritis, left knee 03/24/2020   Knee pain, chronic 06/15/2016   GERD (gastroesophageal reflux disease)    Morbid obesity (HCC)    Urethritis 03/20/2013    Orientation RESPIRATION BLADDER  Height & Weight     Self  Normal Incontinent, External catheter Weight: 249 lb 1.9 oz (113 kg) Height:  5\' 6"  (167.6 cm)  BEHAVIORAL SYMPTOMS/MOOD NEUROLOGICAL BOWEL NUTRITION STATUS      Incontinent, Colostomy Diet (See discharge summary)  AMBULATORY STATUS COMMUNICATION OF NEEDS Skin   Total Care Verbally Hydro Therapy, PU Stage and Appropriate Care (Venous stasis ulcer Tibial Left and right and  Heel Left Stage 1 -  Intact skin with non-blanchable redness of a localized area usually over a bony prominence)                       Personal Care Assistance Level of Assistance  Bathing, Feeding, Dressing Bathing Assistance: Maximum assistance Feeding assistance: Maximum assistance Dressing Assistance: Maximum assistance     Functional Limitations Info  Sight, Hearing, Speech Sight Info: Impaired Hearing Info: Impaired Speech Info: Adequate    SPECIAL CARE FACTORS FREQUENCY  PT (By licensed PT), OT (By licensed OT)     PT Frequency: 5x/wk OT Frequency: 5x/wk            Contractures Contractures Info: Not present    Additional Factors Info  Code Status, Allergies Code Status Info: FULL Allergies Info: No Known Allergies           Current Medications (10/11/2023):  This is the current hospital active medication list Current Facility-Administered Medications  Medication Dose Route Frequency Provider Last Rate Last Admin   acetaminophen (TYLENOL) tablet 1,000 mg  1,000 mg Oral Trecia Rogers, MD   1,000 mg at 10/11/23 0617   alum &  mag hydroxide-simeth (MAALOX/MYLANTA) 200-200-20 MG/5ML suspension 30 mL  30 mL Oral Q6H PRN Karie Soda, MD       ascorbic acid (VITAMIN C) tablet 500 mg  500 mg Oral BID Hunsucker, Lesia Sago, MD   500 mg at 10/10/23 2232   Chlorhexidine Gluconate Cloth 2 % PADS 6 each  6 each Topical Q2200 Adam Phenix, PA-C   6 each at 10/10/23 2233   enoxaparin (LOVENOX) injection 120 mg  1 mg/kg Subcutaneous Q12H Cindi Carbon, RPH   120  mg at 10/11/23 1610   feeding supplement (ENSURE ENLIVE / ENSURE PLUS) liquid 237 mL  237 mL Oral TID BM Adam Phenix, PA-C   237 mL at 10/10/23 1937   fentaNYL (SUBLIMAZE) injection 50 mcg  50 mcg Intravenous Q2H PRN Adam Phenix, PA-C   50 mcg at 10/10/23 9604   hydrocortisone sodium succinate (SOLU-CORTEF) 100 MG injection 100 mg  100 mg Intravenous Daily Oretha Milch, MD   100 mg at 10/10/23 5409   insulin aspart (novoLOG) injection 1-3 Units  1-3 Units Subcutaneous TID AC & HS Adam Phenix, PA-C   1 Units at 10/10/23 2233   leptospermum manuka honey (MEDIHONEY) paste 1 Application  1 Application Topical BID Adam Phenix, PA-C   1 Application at 10/10/23 2233   linezolid (ZYVOX) IVPB 600 mg  600 mg Intravenous Q12H Adam Phenix, [place]   Stopped at 10/11/23 1031   magic mouthwash w/lidocaine  5 mL Oral QID PRN Adam Phenix, PA-C   5 mL at 10/05/23 2216   menthol-cetylpyridinium (CEPACOL) lozenge 3 mg  1 lozenge Oral PRN Adam Phenix, PA-C       methocarbamol (ROBAXIN) tablet 1,000 mg  1,000 mg Oral Q6H PRN Karie Soda, MD   1,000 mg at 10/09/23 1938   midodrine (PROAMATINE) tablet 10 mg  10 mg Oral TID WC Adam Phenix, PA-C   10 mg at 10/10/23 1714   multivitamin with minerals tablet 1 tablet  1 tablet Oral Daily Adam Phenix, PA-C   1 tablet at 10/10/23 8119   norepinephrine (LEVOPHED) 4mg  in (0.016 mg/mL) premix infusion  0-40 mcg/min Intravenous Titrated Oretha Milch, MD   Stopped at 10/09/23 1059   ondansetron (ZOFRAN) tablet 4 mg  4 mg Oral Q6H PRN Adam Phenix, PA-C       Or   ondansetron (ZOFRAN) injection 4 mg  4 mg Intravenous Q6H PRN Adam Phenix, PA-C       Oral care mouth rinse  15 mL Mouth Rinse 4 times per day Lanae Boast, MD   15 mL at 10/10/23 2232   Oral care mouth rinse  15 mL Mouth Rinse PRN Kc, Dayna Barker, MD       oxyCODONE (Oxy IR/ROXICODONE) immediate release tablet 5-10 mg  5-10 mg Oral Q4H  PRN Karie Soda, MD   5 mg at 10/10/23 1158   phenol (CHLORASEPTIC) mouth spray 2 spray  2 spray Mouth/Throat PRN Karie Soda, MD       piperacillin-tazobactam (ZOSYN) IVPB 3.375 g  3.375 g Intravenous Q8H Adam Phenix, PA-C 12.5 mL/hr at 10/11/23 1110 3.375 g at 10/11/23 1110   polycarbophil (FIBERCON) tablet 625 mg  625 mg Oral BID Karie Soda, MD   625 mg at 10/10/23 2232   polyethylene glycol (MIRALAX / GLYCOLAX) packet 17 g  17 g Oral Q12H PRN Karie Soda, MD       zinc sulfate (  50mg  elemental zinc) capsule 220 mg  220 mg Oral Daily Hunsucker, Lesia Sago, MD   220 mg at 10/10/23 8416     Discharge Medications: Please see discharge summary for a list of discharge medications.  Relevant Imaging Results:  Relevant Lab Results:   Additional Information SSN: [id number]  Otelia Santee, LCSW

## 2023-10-11 NOTE — Progress Notes (Signed)
 Patient refuses dressing changes and schedule medications. Complies with IV antibiotics. Patient allowed nurse to removed soiled abdominal pads from sacral dressing but refused further care. Pt stated "I will call 911". Pt further endorses that he wants to leave the facility.

## 2023-10-11 NOTE — TOC Progression Note (Addendum)
 Transition of Care [facility]) - Progression Note    Patient Details  Name: Derek [name] MRN: [id number] Date of Birth: May 02, 1946  [facility]) CM/SW Contact  Otelia Santee, LCSW Phone Number: 10/11/2023, 1:17 PM  Clinical Narrative:    Referrals have been faxed out for SNF placement. Currently awaiting bed offers.  CLC checklist faxed in to [facility] for review for SNF authorization.   Expected Discharge Plan:  (TBD) Barriers to Discharge: No Barriers Identified  Expected Discharge Plan and Services In-house Referral: Clinical Social Work Discharge Planning Services: NA Post Acute Care Choice:  (Unsure at this time) Living arrangements for the past 2 months: Single Family Home                                       Social Determinants of Health (SDOH) Interventions SDOH Screenings   Food Insecurity: No Food Insecurity (09/27/2023)  Housing: Low Risk  (09/27/2023)  Transportation Needs: No Transportation Needs (09/27/2023)  Utilities: Not At Risk (09/27/2023)  Alcohol Screen: Low Risk  (12/24/2022)  Depression (PHQ2-9): Medium Risk (04/05/2023)  Financial Resource Strain: Low Risk  (12/24/2022)  Physical Activity: Inactive (04/05/2023)  Social Connections: Socially Isolated (09/27/2023)  Stress: Stress Concern Present (04/05/2023)  Tobacco Use: Low Risk  (09/28/2023)    Readmission Risk Interventions     No data to display
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
## 2023-10-11 NOTE — Progress Notes (Signed)
 Daily Progress Note   Patient Name: Derek [name]       Date: 10/11/2023 DOB: May 28, 1946  Age: 78 y.o. MRN#: [id number] Attending Physician: Lanae Boast, MD Primary Care Physician: [facility] Admit Date: 09/26/2023 Length of Stay: 15 days  Reason for Consultation/Follow-up: Establishing goals of care  Subjective:   CC: Patient laying in bed saying he wants to go to the [place].  Following up regarding complex medical decision making.   Subjective:   Reviewed EMR for updates.  RN reached out to palliative medicine team and hospitalist as patient refusing multiple medications and appropriate wound care interventions.  Presented to bedside to meet with patient.  Patient laying in bed.  Patient appears agitated notes that he wants his wife called so she can come and get him.  Patient stating he wants to go to the [place].  Inquired how patient would get up and go to the [place] and he noted that he cannot because he is "scrapped in bed" though patient has no restraints in place.  Attempted to inquire with patient about his illness and patient reports that "sniffles" brought him into the hospital.  Patient cannot speak to the aches did not of his wound.  Patient appears confused and unable to engage in complex medical decision making.  No family present at bedside.  Discussed care with RN, TOC, and hospitalist.  Vital Signs:  BP (!) 104/40 (BP Location: Left Wrist)   Pulse 68   Temp (!) 97.4 F (36.3 C) (Oral)   Resp (!) 24   Ht 5\' 6"  (1.676 m)   Wt 113 kg   SpO2 94%   BMI 40.21 kg/m   Physical Exam: General: NAD, chronically ill-appearing, confused Cardiovascular: RRR Respiratory: not in respiratory distress Skin: Multiple wounds noted in EMR reviewed  Imaging: I personally reviewed recent imaging.   Assessment & Plan:   Assessment: Patient is a 78 year old male with a past medical history of multiple myeloma on chemotherapy, dyslipidemia, and GERD who was admitted on 09/27/2023  for management of increasing lower extremity edema, peritoneal weeping sores, and worsening generalized weakness for the past 1 to 2 weeks. Upon admission patient found to be in septic shock due to severe dehydration and possible sepsis, AKI, lactic acidosis, and to have multiple electrolyte abnormalities. Palliative medicine team consulted to assist with complex medical decision making.   Recommendations/Plan: # Complex medical decision making/goals of care:  -Patient unable to participate in complex medical decision making at time of visit due to mental status.  -Patient and wife previously expressed desire to continue with appropriate medical interventions with hope for patient's overall improvement.  Patient has had episodes of delirium during ICU stay which is likely multifactorial in the setting of his multiple medical comorbidities.  If patient unable to engage in complex medical decision making, decision making would fall to his wife.  -Should there be medical interventions that are no long appropraite, family should be informed of this to help assist in determining pathways for medical care moving forward.     -Palliative medicine team has been following along peripherally as had previously engaged the patient and family regarding concerns about patient's poor functional status, known multiple myeloma, and lack of nutrition prior to admission which would likely complicate patient's ability to overcome this medical illness with multiple comorbidities.   -  Code Status: Full Code  # Psychosocial Support:  - wife, daughter   # Discharge Planning: To Be Determined  Discussed with: patient, RN,  hospitalist, TOC  Thank you for allowing the palliative care team to participate in the care [facility].  Alvester Morin, DO Palliative Care Provider PMT # [phone]  If patient remains symptomatic despite maximum doses, please call PMT at [phone] between 0700 and 1900. Outside of these  hours, please call attending, as PMT does not have night coverage.  Personally spent 30 minutes in patient care including extensive chart review (labs, imaging, progress/consult notes, vital signs), medically appropraite exam, discussed with treatment team, education to patient, family, and staff, documenting clinical information, medication review and management, coordination of care, and available advanced directive documents.

## 2023-10-11 NOTE — Progress Notes (Signed)
 PROGRESS NOTE Telesforo Brosnahan  ZOX:[id number] DOB: 09-Oct-1945 DOA: 09/26/2023 PCP: [facility]  Brief Narrative/Hospital Course: 78 y.o. male with multiple myeloma on chemotherapy, dyslipidemia, and GERD. Brought in by family for increasing lower extremity edema, perineal weeping sores, and worsening generalized weakness for 1-2 weeks. At baseline patient relatively independent. He has experienced rapid decline in function recently. Per patient report because he is difficult to move he has been left in his urine and feces. Per wife the patient experiences pain and does not want to be moved. In ED, he was found to be hypotensive and given 2.5 L LR, Vanco, Rocephin, Cefepime given and started on Levophed, also managed for hyperkalemia and admitted to ICU 2/11, CCS was consulted s/p laparoscopic loop colostomy excision/debridement, left on vent for A-fib/shock postop.  Subsequently extubated 2/13 managed and amiodarone, Levophed, went to OR 2/17 for debridement and noted to have necrotizing for nursing gangrene.  Return to the OR again 2/19 for debridement, hemoglobin dropped to 6.8 received unit PRBC.  2/20 on remained on low-dose pressors, weaned off pressors and managed on midodrine, stress dose Solu-Cortef. OR Cultures growing E. coli, rare Enterococcus faecalis and rare Pseudomonas and few bacteria it is. 2/24 transferred to [facility] service   Subjective: Seen and examined this morning Nursing reports he had been confused Patient reports he feels better now W/ FAN NEXT TO HIM Overnight afebrile BP stable 103-120s,  Labs remains stable but globin drifting  7.2 gm now   Assessment and Plan: Principal Problem:   Necrotizing fasciitis of multiple sites [facility] Active Problems:   Hyponatremia   Decubitus ulcer of sacral region   Hyperkalemia   AKI (acute kidney injury) (HCC)   Metabolic acidosis   Anorexia   Septic shock (HCC)   Palliative care encounter   Peripheral edema   Pressure  injury of contiguous region involving back, buttock, and hip, stage 2 (HCC)   Pressure injury of left thigh, stage 2 (HCC)   Counseling and coordination of care   Need for emotional support   Hypomagnesemia   Atrial fibrillation (HCC)   Septic shock secondary to large necrotic sacral ulcer s/p debridement S/P diverting ostomy 2/12, and multiple debridement in OR 2/17, 2/19 (S/p EXPLORATORATION OF BACK, HIPS, THIGHS (N/A), REDEBRIDEMENT WOUND (Left), DRESSING CHANGE UNDER ANESTHESIA (N/A):  Weaned off Levophed.  On midodrine, iv hydrocortisone for stress dose.CCS and WOC team following for his extensive wounds Intra-op culture 09/28/23>with E. coli, Enterococcus faecalis and Pseudomonas aeruginosa and few bacteroids Continue current Zosyn, Zyvox x 10 days post source control EOT tentatively 3/3.-But will discuss with ID given his osteomyelitis-Per CCS soft tissue looks stable  Cont hydrotherapy   ABLA Chronic anemia with history of myeloma: Hb as low as 6.8, received multiple blood transfusions.  Baseline hemoglobin ~ 9 gm.  Monitor and transfuse if less than 7. Recent Labs  Lab 10/07/23 0329 10/08/23 0458 10/09/23 0410 10/10/23 0327 10/11/23 0448  HGB 7.8* 8.0* 8.5* 7.7* 7.2*  HCT 23.6* 25.3* 26.5* 24.3* 23.4*    A-fib: Rate controlled, cont therapeutic Lovenox, monitor H&H  Left femoral DVT: Continue therapeutic Lovenox  per Pharmacy   Anasarca with volume overload Hypoalbuminemia Echo with normal EF 07/2023.  Auto diuresing.  Monitor fluid status and daily weight.  Hypokalemia Hypomagnesemia HypoPhosphatemia: Electrolytes appear stable.Monitor Recent Labs  Lab 10/06/23 0434 10/07/23 0329 10/08/23 0458 10/09/23 0410 10/10/23 0327 10/11/23 0448  K 3.6 3.5 3.5 3.6 4.0 3.8  CALCIUM 7.7* 7.6* 7.8* 8.0* 8.3* 8.6*  MG 1.6* 1.7 1.7 1.6* 2.1  --   PHOS 2.6 1.7* 2.3* 1.9* 2.2*  --    Failure to thrive Debility/deconditioning-wheelchair bound at baseline History of  multiple myeloma on chemotherapy: Continue supportive care, palliative care input appreciated at this time opting for aggressive measures.  Continue PT OT  Morbid Obesity: Patient's Body mass index is 40.21 kg/m. : Will benefit with PCP follow-up, weight loss  healthy lifestyle  DVT prophylaxis: SCDs Start: 09/27/23 0602 Code Status:   Code Status: Full Code Family Communication: plan of care discussed with patient at bedside. Patient status is: Remains hospitalized because of severity of illness Level of care: ICU   Dispo: The patient is from: Home w/ wife            Anticipated disposition: SNF in 1 to 2 days, TOC consulted   Objective: Vitals last 24 hrs: Vitals:   10/11/23 0400 10/11/23 0449 10/11/23 0500 10/11/23 0758  BP: (!) 103/34  (!) 114/38   Pulse: 66  70   Resp: 16  17   Temp:  97.6 F (36.4 C)  (!) 97.5 F (36.4 C)  TempSrc:  Oral  Oral  SpO2: 95%  98%   Weight:      Height:       Weight change:   Physical Examination: General exam: alert awake, oriented to self people HEENT:Oral mucosa moist, Ear/Nose WNL grossly Respiratory system: Bilaterally clear BS,no use of accessory muscle Cardiovascular system: S1 & S2 +, No JVD. Gastrointestinal system: Abdomen soft,NT,ND, BS+ Nervous System: Alert, awake, moving  UE well and not LE Extremities: LE edema neg,distal peripheral pulses palpable and warm.  Skin: No rashes,no icterus. MSK: Normal muscle bulk,tone, power Right neck with central line in place Multiple  WOUND dressing see picture   Medications reviewed:  Scheduled Meds:  acetaminophen  1,000 mg Oral Q6H   ascorbic acid  500 mg Oral BID   Chlorhexidine Gluconate Cloth  6 each Topical Q2200   enoxaparin (LOVENOX) injection  1 mg/kg Subcutaneous Q12H   feeding supplement  237 mL Oral TID BM   hydrocortisone sod succinate (SOLU-CORTEF) inj  100 mg Intravenous Daily   insulin aspart  1-3 Units Subcutaneous TID AC & HS   leptospermum manuka honey  1  Application Topical BID   midodrine  10 mg Oral TID WC   multivitamin with minerals  1 tablet Oral Daily   mouth rinse  15 mL Mouth Rinse 4 times per day   polycarbophil  625 mg Oral BID   zinc sulfate (50mg  elemental zinc)  220 mg Oral Daily   Continuous Infusions:  linezolid (ZYVOX) IV 600 mg (10/11/23 0929)   norepinephrine (LEVOPHED) Adult infusion Stopped (10/09/23 1059)   piperacillin-tazobactam (ZOSYN)  IV Stopped (10/11/23 1610)   Pressure Injury 09/27/23 Heel Left Stage 1 -  Intact skin with non-blanchable redness of a localized area usually over a bony prominence. (Active)  09/27/23 0947  Location: Heel  Location Orientation: Left  Staging: Stage 1 -  Intact skin with non-blanchable redness of a localized area usually over a bony prominence.  Wound Description (Comments):   Present on Admission: Yes  Dressing Type None 10/09/23 2000   Diet Order             DIET SOFT Room service appropriate? Yes with Assist; Fluid consistency: Thin  Diet effective now                    Nutrition Problem:  Increased nutrient needs Etiology: cancer and cancer related treatments, wound healing Signs/Symptoms: estimated needs Interventions: Refer to RD note for recommendations   Intake/Output Summary (Last 24 hours) at 10/11/2023 1050 Last data filed at 10/11/2023 0620 Gross per 24 hour  Intake 1274.45 ml  Output 1050 ml  Net 224.45 ml   Net IO Since Admission: 162.81 mL [10/11/23 1050]  Wt Readings from Last 3 Encounters:  10/10/23 113 kg  07/30/23 115.6 kg  04/27/23 106.6 kg     Unresulted Labs (From admission, onward)     Start     Ordered   10/11/23 0500  Basic metabolic panel  Daily,   R     Question:  Specimen collection method  Answer:  Unit=Unit collect   10/10/23 0947   10/11/23 0500  CBC  Daily,   R     Question:  Specimen collection method  Answer:  Unit=Unit collect   10/10/23 0947           Data Reviewed: I have personally reviewed following labs  and imaging studies CBC: Recent Labs  Lab 10/07/23 0329 10/08/23 0458 10/09/23 0410 10/10/23 0327 10/11/23 0448  WBC 3.9* 4.2 4.2 5.5 5.4  NEUTROABS  --   --  2.1 2.9  --   HGB 7.8* 8.0* 8.5* 7.7* 7.2*  HCT 23.6* 25.3* 26.5* 24.3* 23.4*  MCV 94.0 97.7 99.6 98.8 101.3*  PLT 247 275 338 322 336   Basic Metabolic Panel:  Recent Labs  Lab 10/06/23 0434 10/07/23 0329 10/08/23 0458 10/09/23 0410 10/10/23 0327 10/11/23 0448  NA 131* 131* 133* 137 139 141  K 3.6 3.5 3.5 3.6 4.0 3.8  CL 101 96* 100 105 105 105  CO2 22 24 25 25 26 27   GLUCOSE 129* 133* 116* 130* 122* 90  BUN 16 15 12 12 18 22   CREATININE 0.77 0.69 0.80 0.56* 0.67 0.59*  CALCIUM 7.7* 7.6* 7.8* 8.0* 8.3* 8.6*  MG 1.6* 1.7 1.7 1.6* 2.1  --   PHOS 2.6 1.7* 2.3* 1.9* 2.2*  --    GFR: Estimated Creatinine Clearance: 92.8 mL/min (A) (by C-G formula based on SCr of 0.59 mg/dL (L)). No results for input(s): "HGBA1C" in the last 72 hours. Recent Labs  Lab 10/10/23 0754 10/10/23 1119 10/10/23 1616 10/10/23 2227 10/11/23 0727  GLUCAP 87 171* 167* 133* 86   No results found for this or any previous visit (from the past 240 hours).  Antimicrobials/Microbiology: Anti-infectives (From admission, onward)    Start     Dose/Rate Route Frequency Ordered Stop   10/04/23 0900  linezolid (ZYVOX) IVPB 600 mg        600 mg 300 mL/hr over 60 Minutes Intravenous Every 12 hours 10/04/23 0809     10/04/23 0830  clindamycin (CLEOCIN) IVPB 900 mg  Status:  Discontinued        900 mg 100 mL/hr over 30 Minutes Intravenous Every 6 hours 10/04/23 0730 10/04/23 0809   09/27/23 1100  piperacillin-tazobactam (ZOSYN) IVPB 3.375 g        3.375 g 12.5 mL/hr over 240 Minutes Intravenous Every 8 hours 09/27/23 1052     09/27/23 0600  ceFEPIme (MAXIPIME) 2 g in sodium chloride 0.9 % 100 mL IVPB  Status:  Discontinued        2 g 200 mL/hr over 30 Minutes Intravenous Every 24 hours 09/27/23 0021 09/27/23 1025   09/27/23 0030  vancomycin  (VANCOREADY) IVPB 2000 mg/400 mL        2,000  mg 200 mL/hr over 120 Minutes Intravenous  Once 09/27/23 0021 09/27/23 0333   09/27/23 0021  vancomycin variable dose per unstable renal function (pharmacist dosing)  Status:  Discontinued         Does not apply See admin instructions 09/27/23 0021 09/27/23 0416   09/26/23 2200  cefTRIAXone (ROCEPHIN) 2 g in sodium chloride 0.9 % 100 mL IVPB        2 g 200 mL/hr over 30 Minutes Intravenous  Once 09/26/23 2148 09/26/23 2323         Component Value Date/Time   SDES TISSUE 09/28/2023 1137   SPECREQUEST SACRAL WOUND 09/28/2023 1137   CULT  09/28/2023 1137    RARE ESCHERICHIA COLI RARE ENTEROCOCCUS FAECALIS RARE PSEUDOMONAS AERUGINOSA FEW BACTEROIDES THETAIOTAOMICRON BETA LACTAMASE POSITIVE Performed at [facility], [address] [place], [place] [address]    REPTSTATUS 10/02/2023 FINAL 09/28/2023 1137     Radiology Studies: No results found.   LOS: 15 days   Total time spent in review of labs and imaging, patient evaluation, formulation of plan, documentation and communication with family: 35 minutes  Lanae Boast, MD  [facility]  10/11/2023, 10:50 AM

## 2023-10-11 NOTE — Progress Notes (Signed)
 Patient refuses all 1000 oral medications.  States that he is well and no longer needs medications. MD notified.  Got ICU RN director to help speak to him, educate him, and she could not get him to take medications either.  Able to continue IV antibiotics because the lies are connected to his internal jugular lines already and we are just hanging new bags of medication.  He says he wants to go to the [place] hospital, wants to be cared for by his [place] doctor, and will only accept care/medications from the [facility] hospital.  He doesn't want any care from anyone here. RN has been giving education all morning about the care that we are trying to give him.  MD notified.   Earlier, stated that the nasal canula was killing him and kept taking the nasal canula off.  RN took the nasal canula off and the patient is holding an O2 sat of 94% of RA.    Patient is oriented to self and says he is at [facility] in [place].  He states he is going home today.  Keeps having RN call his wife to come and get him.  Talks to wife and daughter on the phone.  One time RN left phone with patient in the bed, and patient called 911 to have the police come and get him.  He is saying he is being held against his will.  He refused his noon medications.  He refuses to eat.  As I tried to feed him, he stated that the RN was trying to poison him.  Tried more education.  PT came at 1400 to do hydrotherapy on wounds on sacrum, buttocks, and back and patient refused.  PT educated the patient on the goals of the daily therapy and the goals of healing with the wound therapy.  He restated that he wanted to be taken care of at the [facility] hospital.  RN also attempted to do wound therapy on bilateral leg wounds with Vashe and change dressings but patient refused this wound treatment as well.  MD notified.  MD told me to notify surgical and palliative consults about the refusal of wound treatments.  Both were notified by RN.  Patient has refused  all mouth care and repositioning all day.  He thinks he is strapped down in the bed (it is the blanket across his belly). - told palliative care doctor that.  Patient refused 1700 midodrine.  Patient's BP is holding steady.  MD notified.  Patient refused to eat dinner.  Is drinking liquids.  Patient refused 1800 PO Tylenol and Lovenox shot.  Odis Luster, RN

## 2023-10-11 NOTE — Plan of Care (Signed)
  Problem: Clinical Measurements: Goal: Will remain free from infection Outcome: Progressing Goal: Cardiovascular complication will be avoided Outcome: Progressing   Problem: Coping: Goal: Level of anxiety will decrease Outcome: Progressing   Problem: Elimination: Goal: Will not experience complications related to bowel motility Outcome: Progressing Goal: Will not experience complications related to urinary retention Outcome: Progressing

## 2023-10-11 NOTE — Progress Notes (Signed)
 PT Cancellation Note  Patient Details Name: Derek [name] MRN: [id number] DOB: [birth year]   Cancelled Treatment:    Reason Eval/Treat Not Completed: Patient declined, no reason specified; pt refuses hydrotherapy and mobility. Pt is oriented to place and self, states he is "going to [place] to get this done". RN present and made MD aware. Will continue efforts as schedule allows to attempt hydrotherapy/mobility   [facility] 10/11/2023, 2:18 PM

## 2023-10-12 ENCOUNTER — Inpatient Hospital Stay (HOSPITAL_COMMUNITY): Payer: No Typology Code available for payment source

## 2023-10-12 DIAGNOSIS — R410 Disorientation, unspecified: Secondary | ICD-10-CM | POA: Insufficient documentation

## 2023-10-12 DIAGNOSIS — R609 Edema, unspecified: Secondary | ICD-10-CM

## 2023-10-12 DIAGNOSIS — I4891 Unspecified atrial fibrillation: Secondary | ICD-10-CM | POA: Diagnosis not present

## 2023-10-12 DIAGNOSIS — I82409 Acute embolism and thrombosis of unspecified deep veins of unspecified lower extremity: Secondary | ICD-10-CM | POA: Insufficient documentation

## 2023-10-12 DIAGNOSIS — M726 Necrotizing fasciitis: Secondary | ICD-10-CM | POA: Diagnosis not present

## 2023-10-12 DIAGNOSIS — R6521 Severe sepsis with septic shock: Secondary | ICD-10-CM | POA: Diagnosis not present

## 2023-10-12 DIAGNOSIS — D62 Acute posthemorrhagic anemia: Secondary | ICD-10-CM | POA: Insufficient documentation

## 2023-10-12 DIAGNOSIS — A419 Sepsis, unspecified organism: Secondary | ICD-10-CM | POA: Diagnosis not present

## 2023-10-12 DIAGNOSIS — I82412 Acute embolism and thrombosis of left femoral vein: Secondary | ICD-10-CM | POA: Diagnosis not present

## 2023-10-12 LAB — CBC
HCT: 24.8 % — ABNORMAL LOW (ref 39.0–52.0)
Hemoglobin: 7.9 g/dL — ABNORMAL LOW (ref 13.0–17.0)
MCH: 32.2 pg (ref 26.0–34.0)
MCHC: 31.9 g/dL (ref 30.0–36.0)
MCV: 101.2 fL — ABNORMAL HIGH (ref 80.0–100.0)
Platelets: 352 10*3/uL (ref 150–400)
RBC: 2.45 MIL/uL — ABNORMAL LOW (ref 4.22–5.81)
RDW: 15.9 % — ABNORMAL HIGH (ref 11.5–15.5)
WBC: 5.5 10*3/uL (ref 4.0–10.5)
nRBC: 0 % (ref 0.0–0.2)

## 2023-10-12 LAB — BASIC METABOLIC PANEL
Anion gap: 10 (ref 5–15)
BUN: 20 mg/dL (ref 8–23)
CO2: 26 mmol/L (ref 22–32)
Calcium: 8.5 mg/dL — ABNORMAL LOW (ref 8.9–10.3)
Chloride: 104 mmol/L (ref 98–111)
Creatinine, Ser: 0.48 mg/dL — ABNORMAL LOW (ref 0.61–1.24)
GFR, Estimated: >60 mL/min (ref >60)
Glucose, Bld: 95 mg/dL (ref 70–99)
Potassium: 3.7 mmol/L (ref 3.5–5.1)
Sodium: 140 mmol/L (ref 135–145)

## 2023-10-12 LAB — GLUCOSE, CAPILLARY
Glucose-Capillary: 106 mg/dL — ABNORMAL HIGH (ref 70–99)
Glucose-Capillary: 184 mg/dL — ABNORMAL HIGH (ref 70–99)
Glucose-Capillary: 145 mg/dL — ABNORMAL HIGH (ref 70–99)
Glucose-Capillary: 126 mg/dL — ABNORMAL HIGH (ref 70–99)

## 2023-10-12 LAB — TYPE AND SCREEN
ABO/RH(D): A POS
Antibody Screen: POSITIVE
DAT, IgG: NEGATIVE

## 2023-10-12 LAB — MAGNESIUM: Magnesium: 1.8 mg/dL (ref 1.7–2.4)

## 2023-10-12 LAB — PHOSPHORUS: Phosphorus: 2.4 mg/dL — ABNORMAL LOW (ref 2.5–4.6)

## 2023-10-12 MED ORDER — HALOPERIDOL LACTATE 5 MG/ML IJ SOLN: 2.5000 mg | Freq: Once | INTRAMUSCULAR | Status: DC

## 2023-10-12 MED ORDER — ORAL CARE MOUTH RINSE: 15.0000 mL | OROMUCOSAL | Status: DC | PRN

## 2023-10-12 MED ORDER — POTASSIUM PHOSPHATES 15 MMOLE/5ML IV SOLN
30.0000 mmol | Freq: Once | INTRAVENOUS | Status: AC
  Administered 2023-10-12: 30 mmol via INTRAVENOUS
  Filled 2023-10-12: qty 10

## 2023-10-12 MED ORDER — ALBUMIN HUMAN 25 % IV SOLN
12.5000 g | Freq: Once | INTRAVENOUS | Status: AC
  Administered 2023-10-12: 12.5 g via INTRAVENOUS
  Filled 2023-10-12: qty 50

## 2023-10-12 NOTE — Progress Notes (Addendum)
 Patient agitated and restlessness overnight unable to re-orient. Per RN, he is confused and refusing her medication including midodrine. Overnight received low dose lorazepam and trazodone with improvement however he was noted to be hypotensive this morning MAP 54. Albumin administered with no improvement. Patient started on low dose Levo, with hope of titrating off however if she continues to requires pressor for MAP>65, consider re-consulting PCCM.   Derek Silversmith, DNP, CCRN, FNP-C, AGACNP-BC Acute Care & Family Nurse Practitioner  [facility]  [facility] See Amion for personal pager PCCM on call pager [phone] until 7 am

## 2023-10-12 NOTE — Progress Notes (Signed)
 PHARMACY - ANTICOAGULATION CONSULT NOTE  Pharmacy Consult for Lovenox Indication: DVT  No Known Allergies  Patient Measurements: Height: 5\' 6"  (167.6 cm) Weight: 113 kg (249 lb 1.9 oz) IBW/kg (Calculated) : 63.8  Vital Signs: Temp: 97.6 F (36.4 C) (02/26 0743) Temp Source: Axillary (02/26 0743) BP: 129/93 (02/26 1030) Pulse Rate: 91 (02/26 1030)  Labs: Recent Labs    10/10/23 0327 10/11/23 0448 10/12/23 0506  HGB 7.7* 7.2* 7.9*  HCT 24.3* 23.4* 24.8*  PLT 322 336 352  CREATININE 0.67 0.59* 0.48*    Estimated Creatinine Clearance: 92.8 mL/min (A) (by C-G formula based on SCr of 0.48 mg/dL (L)).   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  -Initially held off on [facility] given low Hgb/Plt, but UFH started on 2/15, stopped 2/19. Resumed 2/20 (never had a therapeutic level) -2/21 UFH > LMWH due to concern for heparin resistance -Anti-Xa = 0.65 after 4th dose -Hgb 7.9, Plts rising. Transfuse for Hgb<7  Goal of Therapy:  Anti-Xa level 0.6-1 units/ml 4hrs after LMWH dose given Monitor platelets by anticoagulation protocol: Yes   Plan:  Con't Lovenox 120mg  SQ q12 hrs. DOAC possibly 2/27?   Renato Spellman S. Merilynn Finland, PharmD, BCPS Clinical Staff Pharmacist [email or web address] [facility], [employer] 10/12/2023,10:54 AM

## 2023-10-12 NOTE — TOC Progression Note (Signed)
 Transition of Care [facility]) - Progression Note    Patient Details  Name: Derek [name] MRN: [id number] Date of Birth: 05-05-1946  [facility]) CM/SW Contact  Otelia Santee, LCSW Phone Number: 10/12/2023, 1:39 PM  Clinical Narrative:    Awaiting VA approval for SNF and list of which SNF's pt able to go to under his VA coverage.    Expected Discharge Plan:  (TBD) Barriers to Discharge: No Barriers Identified  Expected Discharge Plan and Services In-house Referral: Clinical Social Work Discharge Planning Services: NA Post Acute Care Choice:  (Unsure at this time) Living arrangements for the past 2 months: Single Family Home                                       Social Determinants of Health (SDOH) Interventions SDOH Screenings   Food Insecurity: No Food Insecurity (09/27/2023)  Housing: Low Risk  (09/27/2023)  Transportation Needs: No Transportation Needs (09/27/2023)  Utilities: Not At Risk (09/27/2023)  Alcohol Screen: Low Risk  (12/24/2022)  Depression (PHQ2-9): Medium Risk (04/05/2023)  Financial Resource Strain: Low Risk  (12/24/2022)  Physical Activity: Inactive (04/05/2023)  Social Connections: Socially Isolated (09/27/2023)  Stress: Stress Concern Present (04/05/2023)  Tobacco Use: Low Risk  (09/28/2023)    Readmission Risk Interventions     No data to display

## 2023-10-12 NOTE — Progress Notes (Signed)
 Right upper extremity venous duplex has been completed. Preliminary results can be found in CV Proc through chart review.   10/12/23 10:18 AM Olen Cordial RVT

## 2023-10-12 NOTE — Progress Notes (Signed)
 Physical Therapy Treatment Patient Details Name: Derek [name] MRN: [id number] DOB: 04/29/1946 Today's Date: 10/12/2023   History of Present Illness Pt is a 78 year old male admitted on 09/27/23 with sepsis.  Pt s/p s/p laparoscopic loop colostomy, excisional sharp debridement of sacral wound by Dr. Dossie Der on 09/28/23.  Pt was left on vent for Afib/shock state postop.  Pt extubated 09/29/23.  Pt s/p extensive excisional debridement of back hip thighs and sacrum by Dr Michaell Cowing on 10/03/2023 and  then redebridement and repacking by Dr. Michaell Cowing on 10/05/2023.  PMH Includes but is not limited to ca, GERD, fall, HLD, L TKA, TLIF L4-l5, ACDF and periprosthetic ORIF of LLE on 12/05/2022. Recent diagnosis of chronic cervical and lower brainstem myelomalacia, on chemotherapy.    PT Comments  Pt is not progressing with mobility; limited by pain and cognition. Requiring +3 ore more  to roll R and L and to reposition in bed.     If plan is discharge home, recommend the following: Two people to help with walking and/or transfers;Two people to help with bathing/dressing/bathroom   Can travel by private vehicle     No  Equipment Recommendations       Recommendations for Other Services       Precautions / Restrictions Precautions Precautions: Fall Recall of Precautions/Restrictions: Impaired Precaution/Restrictions Comments: Arterial Line. Multiple Lines; Ostomy; remains on pressors Restrictions Weight Bearing Restrictions Per Provider Order: No     Mobility  Bed Mobility Overal bed mobility: Needs Assistance Bed Mobility: Rolling Rolling: +2 for physical assistance, +2 for safety/equipment, Total assist         General bed mobility comments: pt not able to reach across midline to assist with upper body, reliant on total assist    Transfers                        Ambulation/Gait                   Stairs             Wheelchair Mobility     Tilt Bed     Modified Rankin (Stroke Patients Only)       Balance                                            Communication Communication Communication: Impaired Factors Affecting Communication: Difficulty expressing self  Cognition Arousal: Alert Behavior During Therapy: Anxious   PT - Cognitive impairments: Orientation                       PT - Cognition Comments: agitated earlier today, confused but verbalizign during session Following commands: Impaired      Cueing Cueing Techniques: Verbal cues, Tactile cues  Exercises      General Comments        Pertinent Vitals/Pain Pain Assessment Pain Assessment: Faces Faces Pain Scale: Hurts worst Pain Location: wound  and right arm Pain Descriptors / Indicators: Sore Pain Intervention(s): Repositioned, Premedicated before session    Home Living                          Prior Function            PT Goals (current goals can now be found in the care plan section)  Acute Rehab PT Goals PT Goal Formulation: Patient unable to participate in goal setting Time For Goal Achievement: 10/23/23 Potential to Achieve Goals: Poor Progress towards PT goals: Not progressing toward goals - comment (pain, cognition)    Frequency    Min 1X/week      PT Plan      Co-evaluation              AM-PAC PT "6 Clicks" Mobility   Outcome Measure  Help needed turning from your back to your side while in a flat bed without using bedrails?: Total Help needed moving from lying on your back to sitting on the side of a flat bed without using bedrails?: Total Help needed moving to and from a bed to a chair (including a wheelchair)?: Total Help needed standing up from a chair using your arms (e.g., wheelchair or bedside chair)?: Total Help needed to walk in hospital room?: Total Help needed climbing 3-5 steps with a railing? : Total 6 Click Score: 6    End of Session   Activity Tolerance: Patient  limited by pain Patient left: in bed;with call bell/phone within reach;with nursing/sitter in room   PT Visit Diagnosis: Muscle weakness (generalized) (M62.81);Other abnormalities of gait and mobility (R26.89)     Time: 6578-4696 PT Time Calculation (min) (ACUTE ONLY): 11 min  Charges:    $Therapeutic Activity: 8-22 mins PT General Charges $$ ACUTE PT VISIT: 1 Visit                     Dione Petron, PT  Acute Rehab Dept [facility]) [phone]  10/12/2023    [facility] 10/12/2023, 5:00 PM

## 2023-10-12 NOTE — Progress Notes (Signed)
 Nutrition Follow-up  DOCUMENTATION CODES:   Obesity unspecified  INTERVENTION:  - Soft diet per MD.  - Assistance with ordering meals added to [employer].   - Ensure Plus High Protein po TID, each supplement provides 350 kcal and 20 grams of protein.  - Continue Multivitamin with minerals daily, 500mg  vitamin C, and 220mg  zinc x14 days to promote wound healing.   - Monitor weight trends.    NUTRITION DIAGNOSIS:   Increased nutrient needs related to cancer and cancer related treatments, wound healing as evidenced by estimated needs. *ongoing  GOAL:   Patient will meet greater than or equal to 90% of their needs *progressing  MONITOR:   Diet advancement, Weight trends  REASON FOR ASSESSMENT:   Consult Assessment of nutrition requirement/status  ASSESSMENT:   78 y.o. male with PMH multiple myeloma on chemotherapy, dyslipidemia, and GERD who presented for increasing lower extremity edema, perineal weeping sores, and worsening generalized weakness for 1-2 weeks.  2/11 Admit; Heart diet 212 s/p colostomy, debridement of sacral wound; returned to ICU intubated 2/13 Extubated; CLD 2/17 back to OR for debridement, found to have fournier's gangrene and necrotizing fascitis 2/18 Heart diet 2/19 back to OR for another debridement; CLD 2/20 FLD 2/24 Soft diet  Patient noted to be agitated and confused overnight. This AM, still somewhat confused but answered all questions appropriately.  He reports soft diet is going okay and that he ate a good breakfast this morning with eggs and sausage. Admits his appetite is decreased but still trying to eat. Encouraged patient to continue to do this.  He has been receiving Ensure and reports liking it. Noted to be consuming 0-3x/day.  Patient documented to have had 0% of meals yesterday but 100% of breakfast and 50% of lunch today. Suspect mental status may be affecting adequate oral intake. Will continue supplements and encourage oral  intake. Will monitor intake closely. If intake is poor, would recommend consideration of Cortrak to aid in meeting increased nutrient needs for wound healing if within patient's goals of care.    Admit weight: 240# Current weight: 260# I&O's: -1L since 2/12 ** + for moderate pitting generalized edema  Medications reviewed and include: vitamin C BID, MVI, 220mg  zinc x14 days (ends 3/5) Levophed @ 1 mcg/min  Labs reviewed:  Phosphorus 2.4   Diet Order:   Diet Order             DIET SOFT Room service appropriate? Yes with Assist; Fluid consistency: Thin  Diet effective now                   EDUCATION NEEDS:  Not appropriate for education at this time  Skin:  Skin Assessment: Skin Integrity Issues: Skin Integrity Issues:: Other (Comment), Unstageable, DTI, Incisions DTI: Left Heel Unstageable: Sacrum Incisions: Abdomen Other: Venous stasis ulcer on L and R tibial  Last BM:  2/26 - colostomy  Height:  Ht Readings from Last 1 Encounters:  09/26/23 5\' 6"  (1.676 m)   Weight:  Wt Readings from Last 1 Encounters:  10/12/23 118 kg   Ideal Body Weight:  64.55 kg  BMI:  Body mass index is 41.99 kg/m.  Estimated Nutritional Needs:  Kcal:  2200-2400 kcals Protein:  120-140 grams Fluid:  >/= 2.2L    Shelle Iron RD, LDN Contact via Secure Chat.

## 2023-10-12 NOTE — Progress Notes (Signed)
 Progress Note   Patient: Derek [name] ZOX:[id number] DOB: 28-Aug-1945 DOA: 09/26/2023     16 DOS: the patient was seen and examined on 10/12/2023   Brief hospital course: 78 year old man complex hospitalization admitted 2/10 to ICU with subsequent return to the OR for debridement of necrotizing gangrene.   Assessment and Plan: Septic shock secondary to large necrotic sacral ulcer s/p debridement Status post laparoscopic loop colostomy Status post excisional debridement back, sacrum, hips, thighs for necrotizing fasciitis Status post diverting ostomy 2/12, multiple debridements subsequently Was transferred from critical care to hospitalist service 2/24  Hypotensive again earlier this morning and restarted on Levophed.  Critical care reconsulted.  Appears clinically stable.  Doubt developing sepsis. Antibiotics as per infectious disease--some question about plastic evaluation Need to try to establish peripheral line once vasopressor no longer needed Continue wound care as directed by general surgery who will follow him on Thursdays  Acute delirium Altered factorial including ICU stay and illness.   ABLA Chronic anemia with history of myeloma Hb as low as 6.8, received multiple blood transfusions.  Baseline hemoglobin ~ 9 gm.   Hemoglobin stable.  A-fib? Patient in sinus rhythm.    Left femoral DVT Continue therapeutic Lovenox per pharmacy.  If stable can transition to [facility] tomorrow.   Anasarca with volume overload Hypoalbuminemia Echo with normal EF 07/2023.    Hypokalemia Hypomagnesemia Hypophosphatemia Repleted  Failure to thrive Debility/deconditioning-wheelchair bound at baseline History of multiple myeloma on chemotherapy: Continue supportive care, palliative care input appreciated at this time opting for aggressive measures.  Continue PT OT   Morbid Obesity: Patient's Body mass index is 40.21 kg/m. : Will benefit with PCP follow-up, weight loss  healthy  lifestyle  Remain in stepdown today, wean off vasopressors as possible.  Eventually plan for SNF.     Subjective:  Per night coverage: agitated & restless overnight, confused, referesed meds including midodrine, hypotensive in AM, given albumin, started on low dose levo, PCCM reconsulted  Per covering RN pt confused this AM (new), wants to get up, "mean" (unusual)  Physical Exam: Vitals:   10/12/23 0830 10/12/23 0845 10/12/23 0900 10/12/23 0915  BP: (!) 123/37 (!) 116/38 (!) 90/49 (!) 124/55  Pulse: 82 81 72 72  Resp: 17 17 13 15   Temp:      TempSrc:      SpO2: 95% 95% 96% 96%  Weight:      Height:       Physical Exam Vitals reviewed.  Constitutional:      General: He is not in acute distress.    Appearance: He is not ill-appearing or toxic-appearing.  Cardiovascular:     Rate and Rhythm: Normal rate and regular rhythm.     Heart sounds: No murmur heard.    Comments: Telemetry SR Pulmonary:     Effort: Pulmonary effort is normal. No respiratory distress.     Breath sounds: No wheezing, rhonchi or rales.  Abdominal:     General: Abdomen is flat. There is no distension.     Palpations: Abdomen is soft.     Comments: Colostomy mid-abdomen  Musculoskeletal:     Right lower leg: No edema.     Left lower leg: No edema.     Comments: Moderate RUE edema. Moves BLE  Neurological:     Mental Status: He is alert.  Psychiatric:     Comments: Confused, speech fluent and clear     Data Reviewed: BMP noted Hgb stable 7.9  Family Communication: none present  Disposition: Status is: Inpatient Remains inpatient appropriate because: hypotensive, confused     Time spent: 35 minutes  Author: Brendia Sacks, MD 10/12/2023 9:36 AM  For on call review [email or web address].

## 2023-10-12 NOTE — Plan of Care (Signed)
  Problem: Health Behavior/Discharge Planning: Goal: Ability to manage health-related needs will improve Outcome: Progressing   Problem: Clinical Measurements: Goal: Respiratory complications will improve Outcome: Progressing   Problem: Nutrition: Goal: Adequate nutrition will be maintained Outcome: Progressing   Problem: Pain Managment: Goal: General experience of comfort will improve and/or be controlled Outcome: Progressing   Problem: Safety: Goal: Ability to remain free from injury will improve Outcome: Progressing

## 2023-10-12 NOTE — Progress Notes (Signed)
 PT HYDROTHERAPY  NOTE   10/12/23 1600  Subjective Assessment  Subjective Pt is a 78 year old male admitted on 09/27/23 with sepsis. Pt with Necrotic sacral, perineal, scrotal, and posterior thigh pressure wounds. Pt s/p LAPAROSCOPIC LOOP COLOSTOMY and EXCISIONAL SHARP DEBRIDEMENT OF 24 CM TALL X 16 CM WIDE X 4 CM DEEP SACRAL WOUND USING A SCALPEL AND ELECTROCAUTERY on 09/28/23. Pt was left on vent for Afib/shock state postop. Pt extubated 09/29/23. PMH: Includes but is not limited to ca, GERD, fall, HLD, L TKA, TLIF L4-l5, ACDF and periprosthetic ORIF of LLE on 12/05/2022. Recent diagnosis of chronic cervical and lower brainstem myelomalacia, on chemotherapy.   Patient and Family Stated Goals unable to state, no family present  Date of Onset  (present on admission)  Prior Treatments none until this admission  Evaluation and Treatment  Evaluation and Treatment Procedures Explained to Patient/Family Yes  Evaluation and Treatment Procedures Patient unable to consent due to mental status  Pressure Injury 10/12/23 Sacrum Mid Stage 4 - Full thickness tissue loss with exposed bone, tendon or muscle. *PT ONLY* extensive, partially necrotic pressure injury covering sacrum, gluteal region, and posterior thighs.  Date First Assessed/Time First Assessed: 10/12/23 1545   Location: (c) Sacrum  Location Orientation: Mid  Staging: Stage 4 - Full thickness tissue loss with exposed bone, tendon or muscle.  Wound Description (Comments): *PT ONLY* extensive, partially ...  Wound Image    Dressing Type Gauze (Comment);Foam - Lift dressing to assess site every shift;ABD;Moist to dry (medihoney smaller wound distal  thigh)  State of Healing Early/partial granulation  Site / Wound Assessment Bleeding;Brown;Granulation tissue;Painful;Pink;Yellow  % Wound base Red or Granulating 60%  % Wound base Yellow/Fibrinous Exudate 40% (necrosing adipose)  % Wound base Other/Granulation Tissue (Comment)  (palpable bone)  Peri-wound  Assessment Intact  Wound Length (cm) 36 cm  Wound Width (cm) 29 cm  Wound Depth (cm) 8 cm  Wound Surface Area (cm^2) 1044 cm^2  Wound Volume (cm^3) 8352 cm^3  Tunneling (cm) 9-10 (multiple tunnels proximal wound)  Undermining (cm) 3-6  Margins Unattached edges (unapproximated)  Drainage Amount Moderate  Drainage Description Serosanguineous  Treatment Cleansed;Debridement (Selective);Hydrotherapy (Pulse lavage);Packing (Saline gauze);Off loading  Hydrotherapy  Pulsed lavage therapy - wound location sacral/gluteal area  and proximal thighs  Pulsed Lavage with Suction (psi) 12 psi  Pulsed Lavage with Suction - Normal Saline Used 1000 mL  Pulsed Lavage Tip Tip with splash shield  Selective Debridement (non-excisional)  Selective Debridement (non-excisional) - Location sacral, gluteal area, proximal thighs  Selective Debridement (non-excisional) - Tools Used Forceps;Scissors  Selective Debridement (non-excisional) - Tissue Removed brown-black necrotic adipose in wound bed  Wound Therapy - Assess/Plan/Recommendations  Wound Therapy - Clinical Statement Pt admitted with extensive necrotic sacral, perineal, scrotal, and posterior thigh pressure wounds.  Wounds likely due to combination of presence of urines/feces and pressure injury.Pt seen today, 10/12/23 with surgical PA and RN.  Plan is for hydrotherapy and w>d dressing changes to sacral/gluteal area.  medihoney to  small wound posterior thighs;  Pt would benefit from continued hydrotherapy to assist with removal of nonviable tissue and promote wound healing.  pt on  Air mattress for pressure relief.  will continue to follow with surgical team  Wound Therapy - Functional Problem List decreased mobility, pain  Factors Delaying/Impairing Wound Healing Multiple medical problems;Immobility;Infection - systemic/local;Other (comment) (chemo)  Hydrotherapy Plan Dressing change;Patient/family education;Pulsatile lavage with suction;Debridement  Wound  Therapy - Frequency 2X / week  Wound Therapy - Current Recommendations  Other (comment);WOC nurse (CCS)  Wound Therapy - Follow Up Recommendations dressing changes by RN;f/u pulsed lavage with suction;f/u selective debridement  Wound Therapy Goals - Improve the function of patient's integumentary system by progressing the wound(s) through the phases of wound healing by:  Decrease Necrotic Tissue to 30  Decrease Necrotic Tissue - Progress Progressing toward goal  Increase Granulation Tissue to 70  Increase Granulation Tissue - Progress Progressing toward goal  Additional Wound Therapy Goal Pt and family involved in pressure relief such as monitoring bony prominences and turning, positioning  Additional Wound Therapy Goal - Progress Not progressing  Goals/treatment plan/discharge plan were made with and agreed upon by patient/family Yes  Time For Goal Achievement 2 weeks  Wound Therapy - Potential for Goals Poor   Delice Bison, PT  Acute Rehab Dept [facility]) [phone]  10/12/2023

## 2023-10-12 NOTE — Progress Notes (Cosign Needed Addendum)
 Came to the bedside to evaluate the wounds with hydroPT:     During my exam the patient was having a lot of arm pain that was limiting his ability to remain on his side for debridement of sacrum. No role for surgical debridement in the operating room. There is a slight interval increase in tissue/fat necrosis of the central portion of the wound that would benefit from ongoing hydro PT and attempts at sharp debridement. CCS will plan to see the wounds again on Friday with hydroPT and assist with sharp debridement as able. Change dressing BID.   Hosie Spangle, PA-C [facility] Please see Amion for pager number during day hours 7:00am-4:30pm

## 2023-10-12 NOTE — Progress Notes (Signed)
 NAME:  Derek [name], MRN:  [id number], DOB:  1946-05-31, LOS: 16 ADMISSION DATE:  09/26/2023, CONSULTATION DATE: 09/27/2023 REFERRING MD: Jearld Fenton - EDP, CHIEF COMPLAINT: Hypotension   History of Present Illness:  78 year old man with multiple myeloma (on chemotherapy), dyslipidemia, and GERD.  Brought in by family for increasing lower extremity edema, perineal weeping sores, and worsening generalized weakness for 1-2 weeks.  At baseline patient relatively independent.  He is experience rapid decline in function recently.  Per patient report because he is difficult to move he has been left in his urine and feces.  Per wife the patient experiences pain and does not want to be moved. Smoker. In ED, he was found to be hypotensive and given 2.5 L LR, Vanco, Rocephin, Cefepime, Levophed @ 7 mcg/min now, Rx for hyperK, and HTS for hypoNa.    Pertinent Medical History:  Multiple myeloma on chemotherapy, dyslipidemia, GERD  Significant Hospital Events: Including procedures, antibiotic start and stop dates in addition to other pertinent events   2/11 Admit, CCS consult 2/12 LAPAROSCOPIC LOOP COLOSTOMY, EXCISIONAL SHARP DEBRIDEMENT OF 24 CM TALL X 16 CM WIDE X 4 CM DEEP SACRAL WOUND USING A SCALPEL AND ELECTROCAUTERY; Left on vent for Afib/shock state postop 2/13 Extubated  2/15 On amiodarone, on Levophed 2/17 To OR for debridement, fourniers and nec fasc seen 2/19 Return to OR for debridement. Hgb down to 6.8, 2u PRBC 2/21 Remains on low dose pressors.  2/26 PCCM reconsulted for low-dose vasopressor requirements  Interim History / Subjective:  PCCM reconsulted for ongoing hypotension Overnight, agitated/?delirious Requiring low-dose Levophed (1-25mcg) this morning Goal MAP > 65 or SBP > 90, dips slightly below this when sleeping Derek [name] has no complaints at present, just tired/wishes to sleep  Objective:  Blood pressure (!) 120/51, pulse 86, temperature 97.6 F (36.4 C), temperature source  Axillary, resp. rate 16, height 5\' 6"  (1.676 m), weight 113 kg, SpO2 95%.        Intake/Output Summary (Last 24 hours) at 10/12/2023 0917 Last data filed at 10/12/2023 0741 Gross per 24 hour  Intake 1264.3 ml  Output 1550 ml  Net -285.7 ml   Filed Weights   10/08/23 0500 10/09/23 0500 10/10/23 0700  Weight: 115.7 kg 112 kg 113 kg   Physical Examination: General: Acute-on-chronically ill-appearing 78 year old man in NAD. Resting with eyes closed. HEENT: [place]/AT, anicteric sclera, PERRL, moist mucous membranes. Neuro: Awake, oriented x 3, confused about situation. Responds to verbal stimuli. Following commands consistently. Able to squeeze hands bilaterally/wiggle toes.  CV: RRR, ?faint systolic murmur noted. PULM: Breathing even and unlabored on RA. Lung fields CTAB. GI: Soft, nontender, nondistended. Mildly hypoactive bowel sounds. Extremities: Bilateral symmetric 1+ LE edema noted. Skin: Warm/dry, no rashes. +Known sacral wound.  Assessment & Plan:   Septic shock secondary to large necrotic sacral ulcer s/p debridement - cultures with rare E.coli, rare Enterococcus faecalis, rare Pseudomonas aeruginosa, few bacteriodes thetaiotaomicron Diverting ostomy 09/28/2023 - Monitor closely in SDU/ICU - Goal MAP > 65 or SBP > 90,  - Fluid resuscitation as tolerated - Levophed titrated to goal MAP, weaning to off as able - Continue midodrine - Prior hydrocortisone discontinued - Trend WBC, fever curve - F/u Cx data - Continue broad-spectrum antibiotics (Zosyn x 10-day course after source control with tentative end date 3/3) - Local wound care/bedside dressing changes and ostomy care; WOC/hydrotherapy  Atrial fibrillation - Rate controlled  - Cardiac monitoring - Optimize electrolytes for K > 4, Mg > 2 - Lovenox for [facility]  Left femoral DVT - Lovenox as above  Hypokalemia - improved Hypomagnesemia - has remained low despite repletion past few days Hypophosphatemia - Trend BMP, phos, Mg -  Replete electrolytes as indicated - Monitor I&Os - Avoid nephrotoxic agents as able - Ensure adequate renal perfusion; UOP improved  ABLA - presumed acute after wound debridement Thrombocytopenia - stable - Trend H&H - Monitor for signs of active bleeding - Transfuse for Hgb < 7.0, Plt < 20K or hemodynamically significant bleeding  Acute delirium versus encephalopathy Agitated overnight 2/25-2/26, concern for delirium. - Limit sedating medications - Restraints PRN for safety - Limit interruptions to normal sleep/wake cycle as able - Correct metabolic derangements  Wheelchair-bound at baseline Failure to thrive History of multiple myeloma on chemotherapy - Appreciate PMT involvement - PT/OT as indicated  Best Practice: (right click and "Reselect all SmartList Selections" daily)   Diet/type: Full liquid DVT prophylaxis: heparin infusion Pressure ulcer(s): present on admission   GI prophylaxis: oral ppi  Lines: Central line and Arterial Line Foley:  Yes, and it is still needed Code Status:  full code Last date of multidisciplinary goals of care discussion [palliative care medicine continues to follow]  Critical care time: N/A   Faythe Ghee [facility] 10/12/23 9:17 AM  Please see [email or web address] for pager details.  From 7A-7P if no response, please call [phone] After hours, please call ELink [phone]

## 2023-10-13 ENCOUNTER — Other Ambulatory Visit: Payer: Self-pay

## 2023-10-13 DIAGNOSIS — I82412 Acute embolism and thrombosis of left femoral vein: Secondary | ICD-10-CM | POA: Diagnosis not present

## 2023-10-13 DIAGNOSIS — R6521 Severe sepsis with septic shock: Secondary | ICD-10-CM | POA: Diagnosis not present

## 2023-10-13 DIAGNOSIS — I4891 Unspecified atrial fibrillation: Secondary | ICD-10-CM | POA: Diagnosis not present

## 2023-10-13 DIAGNOSIS — M4628 Osteomyelitis of vertebra, sacral and sacrococcygeal region: Secondary | ICD-10-CM | POA: Diagnosis not present

## 2023-10-13 DIAGNOSIS — A419 Sepsis, unspecified organism: Secondary | ICD-10-CM | POA: Diagnosis not present

## 2023-10-13 DIAGNOSIS — M726 Necrotizing fasciitis: Secondary | ICD-10-CM | POA: Diagnosis not present

## 2023-10-13 LAB — CBC
HCT: 27.2 % — ABNORMAL LOW (ref 39.0–52.0)
Hemoglobin: 8.7 g/dL — ABNORMAL LOW (ref 13.0–17.0)
MCH: 31.9 pg (ref 26.0–34.0)
MCHC: 32 g/dL (ref 30.0–36.0)
MCV: 99.6 fL (ref 80.0–100.0)
Platelets: 381 10*3/uL (ref 150–400)
RBC: 2.73 MIL/uL — ABNORMAL LOW (ref 4.22–5.81)
RDW: 16.4 % — ABNORMAL HIGH (ref 11.5–15.5)
WBC: 7.5 10*3/uL (ref 4.0–10.5)
nRBC: 0.3 % — ABNORMAL HIGH (ref 0.0–0.2)

## 2023-10-13 LAB — BASIC METABOLIC PANEL
Anion gap: 8 (ref 5–15)
BUN: 17 mg/dL (ref 8–23)
CO2: 27 mmol/L (ref 22–32)
Calcium: 8.4 mg/dL — ABNORMAL LOW (ref 8.9–10.3)
Chloride: 103 mmol/L (ref 98–111)
Creatinine, Ser: 0.58 mg/dL — ABNORMAL LOW (ref 0.61–1.24)
GFR, Estimated: >60 mL/min (ref >60)
Glucose, Bld: 107 mg/dL — ABNORMAL HIGH (ref 70–99)
Potassium: 3.8 mmol/L (ref 3.5–5.1)
Sodium: 138 mmol/L (ref 135–145)

## 2023-10-13 LAB — GLUCOSE, CAPILLARY
Glucose-Capillary: 115 mg/dL — ABNORMAL HIGH (ref 70–99)
Glucose-Capillary: 163 mg/dL — ABNORMAL HIGH (ref 70–99)
Glucose-Capillary: 121 mg/dL — ABNORMAL HIGH (ref 70–99)
Glucose-Capillary: 137 mg/dL — ABNORMAL HIGH (ref 70–99)

## 2023-10-13 LAB — MAGNESIUM: Magnesium: 1.8 mg/dL (ref 1.7–2.4)

## 2023-10-13 LAB — PHOSPHORUS: Phosphorus: 3 mg/dL (ref 2.5–4.6)

## 2023-10-13 MED ORDER — MAGNESIUM SULFATE 2 GM/50ML IV SOLN
2.0000 g | Freq: Once | INTRAVENOUS | Status: AC
  Administered 2023-10-13: 2 g via INTRAVENOUS
  Filled 2023-10-13: qty 50

## 2023-10-13 MED ORDER — SODIUM CHLORIDE 0.9% FLUSH
10.0000 mL | Freq: Two times a day (BID) | INTRAVENOUS | Status: DC
  Administered 2023-10-13: 20 mL
  Administered 2023-10-14: 10 mL
  Administered 2023-10-14: 40 mL
  Administered 2023-10-15: 10 mL
  Administered 2023-10-15: 20 mL
  Administered 2023-10-16: 10 mL
  Administered 2023-10-16 – 2023-10-17 (×2): 20 mL

## 2023-10-13 MED ORDER — SODIUM CHLORIDE 0.9% FLUSH: 10.0000 mL | INTRAVENOUS | Status: DC | PRN

## 2023-10-13 NOTE — Progress Notes (Signed)
 Peripherally Inserted Central Catheter Placement  The IV Nurse has discussed with the patient and/or persons authorized to consent for the patient, the purpose of this procedure and the potential benefits and risks involved with this procedure.  The benefits include less needle sticks, lab draws from the catheter, and the patient may be discharged home with the catheter. Risks include, but not limited to, infection, bleeding, blood clot (thrombus formation), and puncture of an artery; nerve damage and irregular heartbeat and possibility to perform a PICC exchange if needed/ordered by physician.  Alternatives to this procedure were also discussed.  Bard Power PICC patient education guide, fact sheet on infection prevention and patient information card has been provided to patient /or left at bedside.    PICC Placement Documentation  PICC Double Lumen 10/13/23 Right Brachial 40 cm 0 cm (Active)  Indication for Insertion or Continuance of Line Prolonged intravenous therapies 10/13/23 1800  Exposed Catheter (cm) 0 cm 10/13/23 1800  Site Assessment Clean, Dry, Intact 10/13/23 1800  Lumen #1 Status Flushed;Saline locked;Blood return noted 10/13/23 1800  Lumen #2 Status Flushed;Saline locked;Blood return noted 10/13/23 1800  Dressing Type Transparent;Securing device 10/13/23 1800  Dressing Status Antimicrobial disc/dressing in place;Clean, Dry, Intact 10/13/23 1800  Line Care Connections checked and tightened 10/13/23 1800  Line Adjustment (NICU/IV Team Only) No 10/13/23 1800  Dressing Intervention New dressing 10/13/23 1800  Dressing Change Due 10/20/23 10/13/23 1800       Timmothy Sours 10/13/2023, 6:27 PM

## 2023-10-13 NOTE — Consult Note (Cosign Needed)
 Subjective: Patient is a 78 year old male with history of multiple myeloma.  He presented to the emergency department at [facility] with worsening edema and weeping of his lower extremities and worsening of his sores to his lower extremities.  Patient recently had rapid decline in his functionality.  He was admitted for hyperkalemia and AKI.  He rapidly decompensated and was transferred to the ICU for septic shock with multiorgan failure.  Since admission, general surgery has debrided the wound several times and also performed a laparoscopic loop transverse colostomy for fecal diversion.  Patient currently remains in the ICU on pressors.  Plastics was consulted to evaluate whether the patient was a flap candidate or not.  Upon entering the room today, patient is sleeping in bed comfortably in no acute distress.  I spoke with the nursing staff in regards to the patient.  They state that he has not underwent his dressing changed today and plans to do so shortly.  Objective: Vital signs in last 24 hours: Temp:  [97 F (36.1 C)-98.3 F (36.8 C)] 97 F (36.1 C) (02/27 0800) Pulse Rate:  [58-111] 73 (02/27 1230) Resp:  [11-27] 17 (02/27 1230) BP: (75-153)/(29-125) 84/31 (02/27 1230) SpO2:  [83 %-100 %] 98 % (02/27 1230) Weight:  [102 kg] 111 kg (02/27 0500) Last BM Date : 10/12/23  Intake/Output from previous day: 02/26 0701 - 02/27 0700 In: 2146.8 [P.O.:1250; I.V.:229.5; IV Piggyback:667.4] Out: 2150 [Urine:1100; Stool:1050] Intake/Output this shift: Total I/O In: 86.5 [I.V.:49.1; IV Piggyback:37.4] Out: 250 [Stool:250]  General appearance: appears stated age and no distress, sleeping, but acknowledges when spoken to  Resp: Unlabored breathing, no respiratory distress Incision/Wound: Dressings in place to the sacral wound.  There is a little bit of serous exudate noted on the dressings.  No crepitus noted on exam.  Photos of the wounds were reviewed in the chart.  It appears patient has a  large wounds noted to the sacrum and buttocks as well as the left thigh with healthy appearing tissue mostly throughout the wound.  There does appear to be a little bit of slough/exudate noted centrally to the wound to the buttock.  Lab Results:     Latest Ref Rng & Units 10/13/2023    6:15 AM 10/12/2023    5:06 AM 10/11/2023    4:48 AM  CBC  WBC 4.0 - 10.5 K/uL 7.5  5.5  5.4   Hemoglobin 13.0 - 17.0 g/dL 8.7  7.9  7.2   Hematocrit 39.0 - 52.0 % 27.2  24.8  23.4   Platelets 150 - 400 K/uL 381  352  336     BMET Recent Labs    10/12/23 0506 10/13/23 0615  NA 140 138  K 3.7 3.8  CL 104 103  CO2 26 27  GLUCOSE 95 107*  BUN 20 17  CREATININE 0.48* 0.58*  CALCIUM 8.5* 8.4*   PT/INR No results for input(s): "LABPROT", "INR" in the last 72 hours. ABG No results for input(s): "PHART", "HCO3" in the last 72 hours.  Invalid input(s): "PCO2", "PO2"  Studies/Results: [place] EKG SITE RITE Result Date: 10/13/2023 If [facility] image not attached, placement could not be confirmed due to current cardiac rhythm.  VAS [place] UPPER EXTREMITY VENOUS DUPLEX Result Date: 10/12/2023 UPPER VENOUS STUDY  Patient Name:  Derek [name]  Date of Exam:   10/12/2023 Medical Rec #: [id number]       Accession #:    [id number] Date of Birth: 02/13/1946  Patient Gender: M Patient Age:   5 years Exam Location:  [facility] Procedure:      VAS [place] UPPER EXTREMITY VENOUS DUPLEX Referring Phys: Brendia Sacks --------------------------------------------------------------------------------  Indications: Edema Risk Factors: DVT 09/27/2023 - Left FV. Anticoagulation: Heparin. Limitations: Poor ultrasound/tissue interface, bandages, line and patient positioning, patient movement, patient cooperation. Comparison Study: No prior studies. Performing Technologist: Chanda Busing RVT  Examination Guidelines: A complete evaluation includes B-mode imaging, spectral Doppler, color Doppler, and power Doppler as needed of all  accessible portions of each vessel. Bilateral testing is considered an integral part of a complete examination. Limited examinations for reoccurring indications may be performed as noted.  Right Findings: +----------+------------+---------+-----------+----------+-------+ RIGHT     CompressiblePhasicitySpontaneousPropertiesSummary +----------+------------+---------+-----------+----------+-------+ IJV           Full       Yes       Yes                      +----------+------------+---------+-----------+----------+-------+ Subclavian    Full       Yes       Yes                      +----------+------------+---------+-----------+----------+-------+ Axillary      Full       Yes       Yes                      +----------+------------+---------+-----------+----------+-------+ Brachial      Full                                          +----------+------------+---------+-----------+----------+-------+ Radial        Full                                          +----------+------------+---------+-----------+----------+-------+ Ulnar         Full                                          +----------+------------+---------+-----------+----------+-------+ Cephalic      Full                                          +----------+------------+---------+-----------+----------+-------+ Basilic       Full                                          +----------+------------+---------+-----------+----------+-------+  Left Findings: +----------+------------+---------+-----------+----------+-------+ LEFT      CompressiblePhasicitySpontaneousPropertiesSummary +----------+------------+---------+-----------+----------+-------+ Subclavian    Full       Yes       Yes                      +----------+------------+---------+-----------+----------+-------+  Summary:  Right: No evidence of deep vein thrombosis in the upper extremity. No evidence of superficial vein thrombosis  in the upper extremity.  Left: No evidence of thrombosis in the subclavian.  *See table(s) above for  measurements and observations.  Diagnosing physician: Coral Else MD Electronically signed by Coral Else MD on 10/12/2023 at 4:42:31 PM.    Final     Anti-infectives: Anti-infectives (From admission, onward)    Start     Dose/Rate Route Frequency Ordered Stop   10/04/23 0900  linezolid (ZYVOX) IVPB 600 mg  Status:  Discontinued        600 mg 300 mL/hr over 60 Minutes Intravenous Every 12 hours 10/04/23 0809 10/11/23 1433   10/04/23 0830  clindamycin (CLEOCIN) IVPB 900 mg  Status:  Discontinued        900 mg 100 mL/hr over 30 Minutes Intravenous Every 6 hours 10/04/23 0730 10/04/23 0809   09/27/23 1100  piperacillin-tazobactam (ZOSYN) IVPB 3.375 g        3.375 g 12.5 mL/hr over 240 Minutes Intravenous Every 8 hours 09/27/23 1052 10/15/23 2359   09/27/23 0600  ceFEPIme (MAXIPIME) 2 g in sodium chloride 0.9 % 100 mL IVPB  Status:  Discontinued        2 g 200 mL/hr over 30 Minutes Intravenous Every 24 hours 09/27/23 0021 09/27/23 1025   09/27/23 0030  vancomycin (VANCOREADY) IVPB 2000 mg/400 mL        2,000 mg 200 mL/hr over 120 Minutes Intravenous  Once 09/27/23 0021 09/27/23 0333   09/27/23 0021  vancomycin variable dose per unstable renal function (pharmacist dosing)  Status:  Discontinued         Does not apply See admin instructions 09/27/23 0021 09/27/23 0416   09/26/23 2200  cefTRIAXone (ROCEPHIN) 2 g in sodium chloride 0.9 % 100 mL IVPB        2 g 200 mL/hr over 30 Minutes Intravenous  Once 09/26/23 2148 09/26/23 2323       Assessment/Plan:  Large necrotic sacral ulcer  Patient has not found to be a good candidate for a flap given current pressor requirement, patient's current condition, limited mobility, and nutrition status.  Recommend to continue with prescribed wound care.  I spoke with nursing staff and discussed our recommendations.  They expressed  understanding.  I discussed patient's case with Dr. Ladona Ridgel and he is in agreement with the plan.    LOS: 17 days    Derek Spies, PA-C 10/13/2023   As per Ms Georga Kaufmann. Pt has few options for flap coverage. Will need to be medically and nutritionally stable beforew any consideration can be given to reconstruction his wounds.

## 2023-10-13 NOTE — Progress Notes (Signed)
 Progress Note   Patient: Derek [name] ZOX:[id number] DOB: 12-Oct-1945 DOA: 09/26/2023     17 DOS: the patient was seen and examined on 10/13/2023   Brief hospital course: 78 year old man complex hospitalization admitted 2/10.  PMH includes multiple myeloma on chemotherapy.  Presented with generalized weakness, weeping sores, increasing lower extremity edema.  Admitted for hyperkalemia, AKI.  Rapidly decompensated and was transferred to ICU and care taken over by critical care for septic shock with multiorgan failure in wheelchair-bound patient with severe leg wounds, third spacing, and multiple myeloma on chemo.  He was quickly seen by general surgery for necrotic sacral, perineal, scrotal and posterior thigh pressure wounds.  Underwent diverting loop colostomy, and debridement of wounds.  Developed necrotizing fasciitis and underwent multiple debridements.  At this point remains on vasopressors for hypotension.  Consultants PCCM 2/11 Palliative medicine 2/11 General Surgery 2/11 Infectious disease 2/25  Procedures/Events 2/11 Admit, CCS consult 2/12 LAPAROSCOPIC LOOP COLOSTOMY, EXCISIONAL SHARP DEBRIDEMENT OF 24 CM TALL X 16 CM WIDE X 4 CM DEEP SACRAL WOUND USING A SCALPEL AND ELECTROCAUTERY; Left on vent for Afib/shock state postop.  Perioperative atrial fibrillation.   2/13 Extubated  2/15 atrial fibrillation recurred.  On amiodarone, on Levophed 2/17 To OR for debridement.  Status post excisional debridement of back, sacrum, hips, thighs.  Postoperative diagnosis necrotizing fasciitis and Fournier's gangrene left anterior/posterior thigh, right posterior thigh, sacrum, back, left gluteus 2/19 Return to OR for debridement of above.  Hgb down to 6.8, 2u PRBC 2/21 Remains on low dose pressors 2/24 transferred to [facility] 2/26 PCCM reconsulted for low-dose vasopressor requirements  Assessment and Plan: Septic shock secondary to large necrotic sacral ulcer s/p debridement Necrotizing  fasciitis Sacral osteomyelitis Status post laparoscopic loop colostomy Status post excisional debridement back, sacrum, hips, thighs for necrotizing fasciitis Remains on pressors, managed as per critical care.  Need to try to establish peripheral line once vasopressor no longer needed.  Does not appear to be developing worsening infection. Continue wound care as directed by general surgery  Per ID will ask plastic surgery to see to evaluate whether patient flap candidate or not.  If a flap candidate, can consider prolonged antibiotics.  If not, prolonged antibiotics not recommended. If no flap, continue Zosyn through 10/15/2023.  Can change to Augmentin and ciprofloxacin if discharged prior to then. Reengage ID if patient undergoes a flap or is considered a flap candidate. If no intervention from plastics, consider LTAC   Acute delirium Altered factorial including ICU stay and illness. Somewhat better today.  Waxing and waning as is typical.  No focal neurologic deficits.   ABLA Multiple myeloma currently undergoing chemotherapy  chronic anemia  Thrombocytopenia -- resolved Hb as low as 6.8, received multiple blood transfusions.  Baseline hemoglobin ~ 9 gm.   Hemoglobin stable.   Perioperative atrial fibrillation  Remains in sinus rhythm.  On anticoagulation for femoral DVT.   Left femoral DVT Continue therapeutic Lovenox per pharmacy.  Plastics evaluation.  If not a candidate for surgery then can transition to DOAC likely tomorrow.   Anasarca with volume overload Hypoalbuminemia Echo with normal EF 07/2023.    Hypokalemia Hypomagnesemia Hypophosphatemia Repleted  AKI -- resolved Baseline around 1.0.  Peak 4.43.  Severe hyponatremia -- resolved  Failure to thrive Debility/deconditioning, wheelchair bound at baseline Continue supportive care, palliative care input appreciated at this time opting for aggressive measures.  Continue PT OT   Morbid Obesity Patient's Body mass  index is 40.21 kg/m.    Unstageable Pressure  Injury to sacrum/buttocks/B ischium  Full thickness wounds posterior lower legs likely r/t venous insufficiency  Deep Tissue Pressure Injury L heel  Pressure Injury POA: Yes  Pelvic retroperitoneal adenopathy, stable from 09/27/2023 but enlarged from 04/14/2023. Findings may be reactive in etiology. Recommend attention on follow-up or consider CT abdomen pelvis with contrast in 3 months in further initial evaluation (from 10/03/2023)    Subjective:  No events charted overnight Patient feels fine today, no complaints.   Wife at bedside and reports some improvement today although he has been delirious over the last few days.  Physical Exam: Vitals:   10/13/23 0900 10/13/23 0930 10/13/23 1000 10/13/23 1015  BP: (!) 104/30  (!) 105/46 (!) 110/42  Pulse:  88 88 88  Resp: 12 18 (!) 21 20  Temp:      TempSrc:      SpO2:  99% 99% 99%  Weight:      Height:       Physical Exam Vitals reviewed.  Constitutional:      General: He is not in acute distress.    Appearance: He is not ill-appearing or toxic-appearing.  Cardiovascular:     Rate and Rhythm: Normal rate and regular rhythm.     Heart sounds: No murmur heard.    Comments: Telemetry sinus rhythm Pulmonary:     Effort: Pulmonary effort is normal. No respiratory distress.     Breath sounds: No wheezing, rhonchi or rales.  Musculoskeletal:     Right lower leg: No edema.     Left lower leg: No edema.     Comments: Marked right upper extremity edema  Neurological:     Mental Status: He is alert.  Psychiatric:        Mood and Affect: Mood normal.        Behavior: Behavior normal.    Data Reviewed: Hgb stable at 8.7 RUE no DVT on u/s BMP unremarkable, magnesium and phosphorus within normal limits CBG stable  Family Communication: Wife at bedside  Disposition: Status is: Inpatient Remains inpatient appropriate because: Hypotensive     Time spent: 50  minutes  Author: Brendia Sacks, MD 10/13/2023 10:50 AM  For on call review [email or web address].

## 2023-10-13 NOTE — TOC Progression Note (Addendum)
 Transition of Care [facility]) - Progression Note    Patient Details  Name: Derek [name] MRN: [id number] Date of Birth: [birth year]  [facility]) CM/SW Contact  Adrian Prows, RN Phone Number: 10/13/2023, 3:04 PM  Clinical Narrative:    [facility] list of [facility] facilities; [facility] approved for 30 day rehab for pt. 30 day from time of admission into SNF; notified pt's wife Aubert Choyce and dtr [place] list left in pt's room; Mrs Crean verbalized understanding, and says she will pick it up tonight; awaiting choice. [facility]                         [facility]- ADMISSION HOLD effective 10/13/23 [address] [place], [place] [address] Phone:  [phone] Fax:  [phone] Admissions: Edwena Blow  [email or web address]) *Notes: SMOKE FREE, CCN/ [employer] Specialty Care: Dialysis, Peg tube, NG tube,    300 lb. weight limit Star Rating: 2 Overall, 2 Quality ([facility] Waiver in place) ABUSE CITATION  [facility]  ADMISSION HOLD effective 10/07/23 [address] [place], [place] [address] Phone: [phone] Fax: [phone] Admissions: Earl Lites  [email or web address])                 *Notes: SMOKE FREE, CCN/ [employer] Specialty Care: Dialysis, Peg tube, NG tube, TPN,  350 lb. weight limit Star Rating: 2 Overall, 3 Quality [facility] Waiver in process)  [facility] & [facility]    [address] [place], [place] [address] Phone: [phone] Fax: [phone] "Admissions:  Terri Skains [email or web address]) " *Notes: SMOKE FREE, CCN/ [employer] Specialty Care: Dialysis, Peg Tube, NG Tube,    300 lb. weight limit  Star Rating: 3 Overall, 4 Quality  [facility]                           [facility]  [address] [place], [place] [address] Phone: [phone]  Fax:  [phone] Admissions: Lorinda Creed ([email or web address]) *Notes: SMOKE FREE, CCN/ [employer] Specialty Care: Dialysis, Peg Tube, Established Trach, 350- 400 lb. weight limit NOTES:  30 Veteran limit  Star Rating: 3 Overall, 3 Quality  [facility] [address] [place], [place] [address] Phone: [phone] Fax: [phone] Admissions: Marlowe Sax ([email or web address]     Marketing: Maryln Gottron ([email or web address]) *Notes: SMOKE FREE, CCN/ [employer] Speciality Care: Dialysis, Peg tube/ NG tube,   325 lb. weight limit.  Star Rating: 1 Overall, 2 Quality ([facility] Waiver in place)   [facility]  - ADMISSION HOLD effective 10/05/23 [address] [place], [place] [address] Phone: [phone] Fax: [phone] Admissions: Ryann Tisdale ([email or web address])  Marketing: Eugenie Filler ([email or web address])                          Fax: [phone]     *Notes: SMOKING ALLOWED, CCN/ [employer] Specialty Care: Dialysis, Peg Tube, 350 lb. weight limit Star Rating: 1 Overall, 2 Quality [facility] Waiver in place)   [facility]   [address] [place], [place] [address] Phone: [phone] Fax: [phone] Admissions: Marlowe Sax ([email or web address])                             P: [phone]          *Notes: SMOKE FREE, CCN/ [employer] Specialty Care: Dialysis, Trach, Peg Tube, 400 lb. weight limit Star Rating: 4 Overall, 5 Quality  [facility]            [address] [place], [place] [address] Phone: [phone] Fax: [phone] LTC/ Respite referrals-  Cristobal Goldmann ([email or web address]) [phone].               STR only- Whitney Lupita Dawn ([email or web address]) [phone] *Notes: SMOKE FREE, Application required for LTC wait  list  Specialty Care: Secured/ memory care unit,  dialysis, Trach, Peg tube, TPN, 500 lb. weight limit Star Rating: 5 Overall, 5 Quality  [place] [place] [facility]   [address] [place], [place] [address] Phone: [phone] Fax: [phone] Admissions: Ricky Ala ([email or web address])                             *Notes: SMOKE FREE, CCN/ [employer] Specialty Care: Dialysis, Peg Tube, 350 lb. weight limit Star Rating: 5 Overall, 4 Quality  ** RED STAR RATINGS REQUIRE DOCUMENTED VETERAN/ CAREGIVER AGREEMENT PRIOR TO PLACEMENT   Expected Discharge Plan:  (TBD) Barriers to Discharge: No Barriers Identified  Expected Discharge Plan and Services In-house Referral: Clinical Social Work Discharge Planning Services: NA Post Acute Care Choice:  (Unsure at this time) Living arrangements for the past 2 months: Single Family Home                                       Social Determinants of Health (SDOH) Interventions SDOH Screenings   Food Insecurity: No Food Insecurity (09/27/2023)  Housing: Low Risk  (09/27/2023)  Transportation Needs: No Transportation Needs (09/27/2023)  Utilities: Not At Risk (09/27/2023)  Alcohol Screen: Low Risk  (12/24/2022)  Depression (PHQ2-9): Medium Risk (04/05/2023)  Financial Resource Strain: Low Risk  (12/24/2022)  Physical Activity: Inactive (04/05/2023)  Social Connections: Socially Isolated (09/27/2023)  Stress: Stress Concern Present (04/05/2023)  Tobacco Use: Low Risk  (09/28/2023)    Readmission Risk Interventions     No data to display

## 2023-10-13 NOTE — Progress Notes (Signed)
 NAME:  Nicodemus Denk, MRN:  [id number], DOB:  [birth year], LOS: 17 ADMISSION DATE:  09/26/2023, CONSULTATION DATE: 09/27/2023 REFERRING MD: Jearld Fenton - EDP, CHIEF COMPLAINT: Hypotension   History of Present Illness:  78 year old man with multiple myeloma (on chemotherapy), dyslipidemia, and GERD.  Brought in by family for increasing lower extremity edema, perineal weeping sores, and worsening generalized weakness for 1-2 weeks.  At baseline patient relatively independent.  He is experience rapid decline in function recently.  Per patient report because he is difficult to move he has been left in his urine and feces.  Per wife the patient experiences pain and does not want to be moved. Smoker. In ED, he was found to be hypotensive and given 2.5 L LR, Vanco, Rocephin, Cefepime, Levophed @ 7 mcg/min now, Rx for hyperK, and HTS for hypoNa.    Pertinent Medical History:  Multiple myeloma on chemotherapy, dyslipidemia, GERD  Significant Hospital Events: Including procedures, antibiotic start and stop dates in addition to other pertinent events   2/11 Admit, CCS consult 2/12 LAPAROSCOPIC LOOP COLOSTOMY, EXCISIONAL SHARP DEBRIDEMENT OF 24 CM TALL X 16 CM WIDE X 4 CM DEEP SACRAL WOUND USING A SCALPEL AND ELECTROCAUTERY; Left on vent for Afib/shock state postop 2/13 Extubated  2/15 On amiodarone, on Levophed 2/17 To OR for debridement, fourniers and nec fasc seen 2/19 Return to OR for debridement. Hgb down to 6.8, 2u PRBC 2/21 Remains on low dose pressors.  2/26 PCCM reconsulted for low-dose vasopressor requirements  Interim History / Subjective:  No significant events overnight Overall feeling well, getting some rest - states he is not having any pain or discomfort Requiring varying amounts of low-dose NE Goal to d/c central line today, PICC consult ordered  Objective:  Blood pressure (!) 118/31, pulse 66, temperature 97.9 F (36.6 C), temperature source Axillary, resp. rate 16, height 5\' 6"  (1.676  m), weight 111 kg, SpO2 99%.        Intake/Output Summary (Last 24 hours) at 10/13/2023 0855 Last data filed at 10/13/2023 0553 Gross per 24 hour  Intake 2100.95 ml  Output 2150 ml  Net -49.05 ml   Filed Weights   10/10/23 0700 10/12/23 0702 10/13/23 0500  Weight: 113 kg 118 kg 111 kg   Physical Examination: General: Chronically ill-appearing older man in NAD. Resting in bed. HEENT: [place]/AT, anicteric sclera, PERRL, moist mucous membranes. Neuro: Awake, oriented x 4. Responds to verbal stimuli. Following commands consistently. Moves all 4 extremities spontaneously. Generalized weakness.  CV: RRR, no m/g/r. PULM: Breathing even and unlabored on RA. Lung fields CTAB anteriorly. GI: Soft, nontender, nondistended. Normoactive bowel sounds. Extremities: Bilateral symmetric 1+ LE edema noted. Skin: Warm/dry, no rashes. Known sacral wound (please see surgery note for photos).  Assessment & Plan:   Septic shock secondary to large necrotic sacral ulcer s/p debridement - cultures with rare E.coli, rare Enterococcus faecalis, rare Pseudomonas aeruginosa, few bacteriodes thetaiotaomicron Diverting ostomy 09/28/2023 - Monitor closely in SDU/ICU - Goal MAP > 65 or SBP > 90 - Fluid resuscitation as tolerated - Levophed titrated to goal MAP, weaning to off as able - Continue midodrine - Trend WBC, fever curve - F/u Cx data - Continue broad-spectrum antibiotics (Zosyn x 10-day course, tentative end date 3/3) - PICC placement today, 2/27 as able - Local wound care, dressing changes BID - WOC/hydrotherapy - Per CCS, no role for OR debridement, agree with ongoing hydrotherapy and sharp debridement at bedside  Atrial fibrillation - Rate controlled  - Cardiac monitoring - Optimize  electrolytes for K > 4, Mg > 2 - Lovenox for AC  Left femoral DVT - Lovenox as above  Hypokalemia , mild - improved Hypomagnesemia, mild - has remained low despite repletion past few days Hypophosphatemia,  resolved - Trend BMP, Phos/Mg - Replete electrolytes as indicated - Monitor I&Os, UOP better - Avoid nephrotoxic agents as able - Ensure adequate renal perfusion  ABLA - presumed acute after wound debridement - Trend H&H - Monitor for signs of active bleeding - Transfuse for Hgb < 7.0 or hemodynamically significant bleeding  Acute delirium versus encephalopathy Agitated overnight 2/25-2/26, concern for delirium. - Limit sedating medications - Restraints PRN for safety - Limit interruptions to normal sleep/wake cycle as able - Correct metabolic derangements  Wheelchair-bound at baseline Failure to thrive History of multiple myeloma on chemotherapy - Appreciate PMT involvement - PT/OT/SLP as appropriate  Best Practice: (right click and "Reselect all SmartList Selections" daily)   Diet/type: Full liquid DVT prophylaxis: heparin infusion Pressure ulcer(s): present on admission   GI prophylaxis: oral ppi  Lines: Central line and Arterial Line Foley:  Yes, and it is still needed Code Status:  full code Last date of multidisciplinary goals of care discussion [palliative care medicine continues to follow]  Critical care time:    The patient is critically ill with multiple organ system failure and requires high complexity decision making for assessment and support, frequent evaluation and titration of therapies, advanced monitoring, review of radiographic studies and interpretation of complex data.   Critical Care Time devoted to patient care services, exclusive of separately billable procedures, described in this note is 34 minutes.  Tim Lair, PA-C [facility] 10/13/23 10:56 AM  Please see [email or web address] for pager details.  From 7A-7P if no response, please call [phone] After hours, please call ELink [phone]

## 2023-10-14 DIAGNOSIS — R6521 Severe sepsis with septic shock: Secondary | ICD-10-CM | POA: Diagnosis not present

## 2023-10-14 DIAGNOSIS — A419 Sepsis, unspecified organism: Secondary | ICD-10-CM | POA: Diagnosis not present

## 2023-10-14 DIAGNOSIS — D62 Acute posthemorrhagic anemia: Secondary | ICD-10-CM | POA: Diagnosis not present

## 2023-10-14 DIAGNOSIS — M4628 Osteomyelitis of vertebra, sacral and sacrococcygeal region: Secondary | ICD-10-CM | POA: Diagnosis not present

## 2023-10-14 DIAGNOSIS — M726 Necrotizing fasciitis: Secondary | ICD-10-CM | POA: Diagnosis not present

## 2023-10-14 DIAGNOSIS — I4891 Unspecified atrial fibrillation: Secondary | ICD-10-CM | POA: Diagnosis not present

## 2023-10-14 DIAGNOSIS — R410 Disorientation, unspecified: Secondary | ICD-10-CM | POA: Diagnosis not present

## 2023-10-14 LAB — CBC
HCT: 24.5 % — ABNORMAL LOW (ref 39.0–52.0)
Hemoglobin: 7.6 g/dL — ABNORMAL LOW (ref 13.0–17.0)
MCH: 31.8 pg (ref 26.0–34.0)
MCHC: 31 g/dL (ref 30.0–36.0)
MCV: 102.5 fL — ABNORMAL HIGH (ref 80.0–100.0)
Platelets: 308 10*3/uL (ref 150–400)
RBC: 2.39 MIL/uL — ABNORMAL LOW (ref 4.22–5.81)
RDW: 16.5 % — ABNORMAL HIGH (ref 11.5–15.5)
WBC: 7.8 10*3/uL (ref 4.0–10.5)
nRBC: 0 % (ref 0.0–0.2)

## 2023-10-14 LAB — BASIC METABOLIC PANEL
Anion gap: 6 (ref 5–15)
BUN: 14 mg/dL (ref 8–23)
CO2: 28 mmol/L (ref 22–32)
Calcium: 8.3 mg/dL — ABNORMAL LOW (ref 8.9–10.3)
Chloride: 104 mmol/L (ref 98–111)
Creatinine, Ser: 0.59 mg/dL — ABNORMAL LOW (ref 0.61–1.24)
GFR, Estimated: >60 mL/min (ref >60)
Glucose, Bld: 156 mg/dL — ABNORMAL HIGH (ref 70–99)
Potassium: 3.8 mmol/L (ref 3.5–5.1)
Sodium: 138 mmol/L (ref 135–145)

## 2023-10-14 LAB — GLUCOSE, CAPILLARY
Glucose-Capillary: 105 mg/dL — ABNORMAL HIGH (ref 70–99)
Glucose-Capillary: 116 mg/dL — ABNORMAL HIGH (ref 70–99)
Glucose-Capillary: 114 mg/dL — ABNORMAL HIGH (ref 70–99)
Glucose-Capillary: 112 mg/dL — ABNORMAL HIGH (ref 70–99)

## 2023-10-14 LAB — HEPATIC FUNCTION PANEL
ALT: 20 U/L (ref 0–44)
AST: 14 U/L — ABNORMAL LOW (ref 15–41)
Albumin: 1.7 g/dL — ABNORMAL LOW (ref 3.5–5.0)
Alkaline Phosphatase: 115 U/L (ref 38–126)
Bilirubin, Direct: 0.2 mg/dL (ref 0.0–0.2)
Indirect Bilirubin: 0.4 mg/dL (ref 0.3–0.9)
Total Bilirubin: 0.6 mg/dL (ref 0.0–1.2)
Total Protein: 5 g/dL — ABNORMAL LOW (ref 6.5–8.1)

## 2023-10-14 MED ORDER — ENOXAPARIN SODIUM 120 MG/0.8ML IJ SOSY
110.0000 mg | PREFILLED_SYRINGE | Freq: Two times a day (BID) | INTRAMUSCULAR | Status: DC
  Administered 2023-10-14 – 2023-10-17 (×7): 110 mg via SUBCUTANEOUS
  Filled 2023-10-14 (×8): qty 0.74

## 2023-10-14 MED ORDER — ALBUMIN HUMAN 25 % IV SOLN
25.0000 g | Freq: Four times a day (QID) | INTRAVENOUS | Status: AC
  Administered 2023-10-14: 12.5 g via INTRAVENOUS
  Administered 2023-10-14 (×2): 25 g via INTRAVENOUS
  Administered 2023-10-15: 12.5 g via INTRAVENOUS
  Filled 2023-10-14 (×4): qty 100

## 2023-10-14 NOTE — Progress Notes (Signed)
 Brief PCCM progress note  Patient's clinical status continues to wax and wane but currently he is requiring low-dose vasopressor support with aggressive wound care treatments including hydrotherapy.  These acute clinical interventions cannot be provided at skilled nursing facility and patient ongoing care will best be served at a long-term care facility.  Shanina Kepple D. Harris, NP-C [facility] Personal contact information can be found on Amion  If no contact or response made please call [phone] 10/14/2023, 3:13 PM

## 2023-10-14 NOTE — Progress Notes (Signed)
 Progress Note   Patient: Derek [name] JWJ:[id number] DOB: 02-Sep-1945 DOA: 09/26/2023     18 DOS: the patient was seen and examined on 10/14/2023   Brief hospital course: 78 year old man complex hospitalization admitted 2/10.  PMH includes multiple myeloma on chemotherapy.  Presented with generalized weakness, weeping sores, increasing lower extremity edema.  Admitted for hyperkalemia, AKI.  Rapidly decompensated and was transferred to ICU and care taken over by critical care for septic shock with multiorgan failure in wheelchair-bound patient with severe leg wounds, third spacing, and multiple myeloma on chemo.  He was quickly seen by general surgery for necrotic sacral, perineal, scrotal and posterior thigh pressure wounds.  Underwent diverting loop colostomy, and debridement of wounds.  Developed necrotizing fasciitis and underwent multiple debridements.  At this point remains on vasopressors for hypotension.  Consultants PCCM 2/11 Palliative medicine 2/11 General Surgery 2/11 Infectious disease 2/25  Procedures/Events 2/11 Admit, CCS consult 2/12 LAPAROSCOPIC LOOP COLOSTOMY, EXCISIONAL SHARP DEBRIDEMENT OF 24 CM TALL X 16 CM WIDE X 4 CM DEEP SACRAL WOUND USING A SCALPEL AND ELECTROCAUTERY; Left on vent for Afib/shock state postop.  Perioperative atrial fibrillation.   2/13 Extubated  2/15 atrial fibrillation recurred.  On amiodarone, on Levophed 2/17 To OR for debridement.  Status post excisional debridement of back, sacrum, hips, thighs.  Postoperative diagnosis necrotizing fasciitis and Fournier's gangrene left anterior/posterior thigh, right posterior thigh, sacrum, back, left gluteus 2/19 Return to OR for debridement of above.  Hgb down to 6.8, 2u PRBC 2/21 Remains on low dose pressors 2/24 transferred to [facility] 2/26 PCCM reconsulted for low-dose vasopressor requirements  Assessment and Plan: Septic shock secondary to large necrotic sacral ulcer s/p debridement Necrotizing  fasciitis Sacral osteomyelitis Status post laparoscopic loop colostomy Status post excisional debridement back, sacrum, hips, thighs for necrotizing fasciitis Remains on pressors, managed as per critical care.   No signs of worsening infection.  Now status post PEG. Continue wound care as directed by general surgery  Not a flap candidate per plastic surgery. Continue Zosyn through 10/15/2023.  Can change to Augmentin and ciprofloxacin if discharged prior to then. Reengage ID if patient undergoes a flap or is considered a flap candidate.   Acute delirium Altered factorial including ICU stay and illness. Far better at the moment.   ABLA Multiple myeloma currently undergoing chemotherapy  chronic anemia  Thrombocytopenia -- resolved Hb as low as 6.8, received multiple blood transfusions.  Baseline hemoglobin ~ 9 gm.   Hemoglobin stable at 7.6.  Normal variation seen.   Perioperative atrial fibrillation  Remains in sinus rhythm.  On anticoagulation for femoral DVT.   Left femoral DVT Continue therapeutic Lovenox per pharmacy.  Can change to DOAC at any time when cleared per surgery.   Anasarca with volume overload Hypoalbuminemia Echo with normal EF 07/2023.    Hypokalemia Hypomagnesemia Hypophosphatemia Repleted   AKI -- resolved Baseline around 1.0.  Peak 4.43. Currently creatinine is better than baseline.   Severe hyponatremia -- resolved   Failure to thrive Debility/deconditioning, wheelchair bound at baseline Continue supportive care, palliative care input appreciated at this time opting for aggressive measures.  Continue PT OT   Morbid Obesity Patient's Body mass index is 40.21 kg/m.     Unstageable Pressure Injury to sacrum/buttocks/B ischium  Full thickness wounds posterior lower legs likely r/t venous insufficiency  Deep Tissue Pressure Injury L heel  Pressure Injury POA: Yes   Pelvic retroperitoneal adenopathy, stable from 09/27/2023 but enlarged from  04/14/2023. Findings may be reactive in  etiology. Recommend attention on follow-up or consider CT abdomen pelvis with contrast in 3 months in further initial evaluation (from 10/03/2023)  Continues on vasopressors.  Complex wound care.  Referral to [facility] for consideration.    Subjective:  Feels fine today No complaints  Better mentation today per nursing  Physical Exam: Vitals:   10/14/23 0645 10/14/23 0700 10/14/23 0715 10/14/23 0730  BP: (!) 104/40 (!) 139/53 (!) 90/31 (!) 79/28  Pulse: 63 77 (!) 58 (!) 59  Resp: 10 13 10 10   Temp:      TempSrc:      SpO2: 100% 98% 100% 100%  Weight:      Height:       Physical Exam Vitals reviewed.  Constitutional:      General: He is not in acute distress.    Appearance: He is not ill-appearing or toxic-appearing.  Cardiovascular:     Rate and Rhythm: Normal rate and regular rhythm.     Heart sounds: No murmur heard. Pulmonary:     Effort: Pulmonary effort is normal. No respiratory distress.     Breath sounds: No wheezing, rhonchi or rales.  Neurological:     Mental Status: He is alert.  Psychiatric:        Mood and Affect: Mood normal.        Behavior: Behavior normal.     Data Reviewed: UOP 1800 BMP WNL Hgb stable  Family Communication: none present  Disposition: Status is: Inpatient Remains inpatient appropriate because: hypotensive     Time spent: 35 minutes  Author: Brendia Sacks, MD 10/14/2023 7:49 AM  For on call review [email or web address].

## 2023-10-14 NOTE — TOC Progression Note (Addendum)
 Transition of Care [facility]) - Progression Note    Patient Details  Name: Derek [name] MRN: [id number] Date of Birth: 05-26-1946  [facility] CM/SW Contact  Adrian Prows, RN Phone Number: 10/14/2023, 9:58 AM  Clinical Narrative:    [facility] consult for LTAC; spoke w/ pt's wife; she is agreeable to pt being screened by facilities; she also pt and family will discuss; referrals placed w/ DJ at [facility], and [place] at [facility]; both say they are offer beds; [facility] says facility does not have beds available today.  -1319- spoke w/ pt's wife; she says pt wants to d/c to [facility]; DJ notified, and will contact [facility]'s wife to [facility].  -1435- spoke w/ DJ at [facility]; he says request for [facility] has been submitted to [place] and no beds available today.   Expected Discharge Plan:  (TBD) Barriers to Discharge: No Barriers Identified  Expected Discharge Plan and Services In-house Referral: Clinical Social Work Discharge Planning Services: NA Post Acute Care Choice:  (Unsure at this time) Living arrangements for the past 2 months: Single Family Home                                       Social Determinants of Health (SDOH) Interventions SDOH Screenings   Food Insecurity: No Food Insecurity (09/27/2023)  Housing: Low Risk  (09/27/2023)  Transportation Needs: No Transportation Needs (09/27/2023)  Utilities: Not At Risk (09/27/2023)  Alcohol Screen: Low Risk  (12/24/2022)  Depression (PHQ2-9): Medium Risk (04/05/2023)  Financial Resource Strain: Low Risk  (12/24/2022)  Physical Activity: Inactive (04/05/2023)  Social Connections: Socially Isolated (09/27/2023)  Stress: Stress Concern Present (04/05/2023)  Tobacco Use: Low Risk  (09/28/2023)    Readmission Risk Interventions     No data to display

## 2023-10-14 NOTE — Progress Notes (Signed)
 9 Days Post-Op   Subjective/Chief Complaint: No acute events. Seen with hydro PT On midodrine and low dose levo (4 mcg)   Objective: Vital signs in last 24 hours: Temp:  [97.5 F (36.4 C)-98 F (36.7 C)] 97.5 F (36.4 C) (02/28 0833) Pulse Rate:  [51-77] 64 (02/28 1015) Resp:  [9-26] 20 (02/28 1015) BP: (79-155)/(28-74) 113/47 (02/28 1015) SpO2:  [95 %-100 %] 99 % (02/28 1015) Last BM Date : 10/12/23  Intake/Output from previous day: 02/27 0701 - 02/28 0700 In: 503.1 [I.V.:333.2; IV Piggyback:169.9] Out: 3025 [Urine:1800; Stool:1225] Intake/Output this shift: Total I/O In: 39.3 [I.V.:24.1; IV Piggyback:15.3] Out: -   Resting comfortably Unlabored respirations Abdomen is soft, nontender, nondistended.  Ostomy is viable with gas and nonbloody stool in the bag.  Wounds as below:    SACRUM/BUTTOCKS - picture below in left lateral decubitus - improving with hydroPT. Sharp debridement from central portion of wound. 4x5 cm of fibrinous and necrotic tissue removed using a 10 blade scalpel. Mild sanguinous oozing controlled with direct pressure. Probing Q-tub demonstrates large pocket of undermining left buttock.        Lab Results:  Recent Labs    10/13/23 0615 10/14/23 0510  WBC 7.5 7.8  HGB 8.7* 7.6*  HCT 27.2* 24.5*  PLT 381 308   BMET Recent Labs    10/13/23 0615 10/14/23 0510  NA 138 138  K 3.8 3.8  CL 103 104  CO2 27 28  GLUCOSE 107* 156*  BUN 17 14  CREATININE 0.58* 0.59*  CALCIUM 8.4* 8.3*   PT/INR No results for input(s): "LABPROT", "INR" in the last 72 hours. ABG No results for input(s): "PHART", "HCO3" in the last 72 hours.  Invalid input(s): "PCO2", "PO2"  Studies/Results: [place] EKG SITE RITE Result Date: 10/13/2023 If [facility] image not attached, placement could not be confirmed due to current cardiac rhythm.   Anti-infectives: Anti-infectives (From admission, onward)    Start     Dose/Rate Route Frequency Ordered Stop   10/04/23 0900   linezolid (ZYVOX) IVPB 600 mg  Status:  Discontinued        600 mg 300 mL/hr over 60 Minutes Intravenous Every 12 hours 10/04/23 0809 10/11/23 1433   10/04/23 0830  clindamycin (CLEOCIN) IVPB 900 mg  Status:  Discontinued        900 mg 100 mL/hr over 30 Minutes Intravenous Every 6 hours 10/04/23 0730 10/04/23 0809   09/27/23 1100  piperacillin-tazobactam (ZOSYN) IVPB 3.375 g        3.375 g 12.5 mL/hr over 240 Minutes Intravenous Every 8 hours 09/27/23 1052 10/15/23 2359   09/27/23 0600  ceFEPIme (MAXIPIME) 2 g in sodium chloride 0.9 % 100 mL IVPB  Status:  Discontinued        2 g 200 mL/hr over 30 Minutes Intravenous Every 24 hours 09/27/23 0021 09/27/23 1025   09/27/23 0030  vancomycin (VANCOREADY) IVPB 2000 mg/400 mL        2,000 mg 200 mL/hr over 120 Minutes Intravenous  Once 09/27/23 0021 09/27/23 0333   09/27/23 0021  vancomycin variable dose per unstable renal function (pharmacist dosing)  Status:  Discontinued         Does not apply See admin instructions 09/27/23 0021 09/27/23 0416   09/26/23 2200  cefTRIAXone (ROCEPHIN) 2 g in sodium chloride 0.9 % 100 mL IVPB        2 g 200 mL/hr over 30 Minutes Intravenous  Once 09/26/23 2148 09/26/23 2323  Assessment/Plan: POD 16 s/p laparoscopic loop colostomy, excisional sharp debridement of sacral wound by Dr. Dossie Der on 09/28/23 POD 11/9 s/p excisional debridement of back, sacrum, hips and thighs for necrotizing fasciitis Dr. Michaell Cowing - Afebrile, white count 7.8, stable low dose levo requirement + midodrine - WOCN consult for new ostomy. Ostomy bridge removed POD5 - Cont daily WTD dressings to wounds.  Continue specialty mattress, frequent turning, maximize nutrition as able.  Will plan to check wounds Monday unless there is a clinical decline over the weekend. On Monday I will determine in ongoing hydroPT is warranted. No emergent surgical needs.  - Cont abx. Per ID for osteomyelitis for Cx with E. Coli, Enterococcus faecalis,  Pseudomonas  FEN - Soft diet VTE - SCD's, therapeutic lovenox for acute DVT ID - Linezolid and Zosyn. Defer selection to primary  Foley - okay to remove from our standpoint   Anemia - s/p 1U 2/11, 2U 2/12, 2U 2/19. Hgb stable  Aspiration PNA L femoral DVT - newly diagnosed this admission Multiple myeloma on lenalidomide  Wheelchair bound HLD GERD   LOS: 18 days    Adam Phenix 10/14/2023

## 2023-10-14 NOTE — Progress Notes (Signed)
 Physical Therapy Treatment Patient Details Name: Derek [name] MRN: [id number] DOB: [birth year] Today's Date: 10/14/2023   History of Present Illness Pt is a 78 year old male admitted on 09/27/23 with sepsis.  Pt s/p s/p laparoscopic loop colostomy, excisional sharp debridement of sacral wound by Dr. Dossie Der on 09/28/23.  Pt was left on vent for Afib/shock state postop.  Pt extubated 09/29/23.  Pt s/p extensive excisional debridement of back hip thighs and sacrum by Dr Michaell Cowing on 10/03/2023 and  then redebridement and repacking by Dr. Michaell Cowing on 10/05/2023.  PMH Includes but is not limited to ca, GERD, fall, HLD, L TKA, TLIF L4-l5, ACDF and periprosthetic ORIF of LLE on 12/05/2022. Recent diagnosis of chronic cervical and lower brainstem myelomalacia, on chemotherapy.    PT Comments  The patient is asking about LLE, feels numb and he cannot move it.  Patient is noted to to have 2/5 hip flex, 1+ knee extension, 0/5  dorsi and plantar flexion on LLE. Patient  reports  impaired LT on the foot-dorsal and plantar.  Patient is noted to  to have  3/5 hip flex, 3+ knee ext,  dorsi/plantar flexion at least 4/5  on the RLE. LT appears WNL. This may be new findings as on eval, PT reported active movement on the left foot.  Patient is quite limited in mobility in the bed due to  weakness and, large open wound on buttocks and body habitus.   Continue mobility efforts at bed level.  OOB activity at this time could be detrimental to wound healing.      If plan is discharge home, recommend the following: Two people to help with walking and/or transfers;Two people to help with bathing/dressing/bathroom   Can travel by private vehicle     No  Equipment Recommendations  None recommended by PT    Recommendations for Other Services       Precautions / Restrictions Precautions Recall of Precautions/Restrictions: Impaired Precaution/Restrictions Comments: Marland Kitchen Multiple Lines; Ostomy; remains on  pressors Restrictions Weight Bearing Restrictions Per Provider Order: No     Mobility  Bed Mobility   Bed Mobility: Rolling Rolling: +2 for physical assistance, +2 for safety/equipment, Total assist         General bed mobility comments: pt not able to reach across midline to assist with upper body, reliant on total assist, patient unable to move either leg to facilitate the  roll, rolled to each side for limens changed    Transfers                   General transfer comment: NT at this time, fatigue, weakness and pt remains on pressors, extensive wounds    Ambulation/Gait                   Stairs             Wheelchair Mobility     Tilt Bed    Modified Rankin (Stroke Patients Only)       Balance                                            Communication Communication Communication: Impaired Factors Affecting Communication: Difficulty expressing self  Cognition Arousal: Alert     PT - Cognitive impairments: Orientation   Orientation impairments: Time, Situation  PT - Cognition Comments: able to converse with family, asking why he cannot move the left leg, Following commands: Impaired Following commands impaired: Follows one step commands with increased time, Follows one step commands inconsistently    Cueing Cueing Techniques: Verbal cues, Tactile cues  Exercises General Exercises - Lower Extremity Ankle Circles/Pumps: AROM, PROM, Left, Right, 10 reps, Supine Short Arc Quad: AROM, AAROM, 10 reps, Right, Left, Supine Heel Slides: AROM, AAROM, Right, Left, 5 reps, Supine Hip ABduction/ADduction: AAROM, Both, 5 reps, Supine    General Comments        Pertinent Vitals/Pain Pain Assessment Faces Pain Scale: Hurts worst Pain Location: wound Pain Descriptors / Indicators: Discomfort, Moaning    Home Living                          Prior Function            PT Goals  (current goals can now be found in the care plan section) Progress towards PT goals: Progressing toward goals    Frequency    Min 1X/week      PT Plan      Co-evaluation              AM-PAC PT "6 Clicks" Mobility   Outcome Measure  Help needed turning from your back to your side while in a flat bed without using bedrails?: Total Help needed moving from lying on your back to sitting on the side of a flat bed without using bedrails?: Total Help needed moving to and from a bed to a chair (including a wheelchair)?: Total Help needed standing up from a chair using your arms (e.g., wheelchair or bedside chair)?: Total Help needed to walk in hospital room?: Total Help needed climbing 3-5 steps with a railing? : Total 6 Click Score: 6    End of Session   Activity Tolerance: Patient tolerated treatment well Patient left: in bed;with call bell/phone within reach;with family/visitor present Nurse Communication: Mobility status;Need for lift equipment PT Visit Diagnosis: Muscle weakness (generalized) (M62.81);Other abnormalities of gait and mobility (R26.89)     Time: 1048-1100 PT Time Calculation (min) (ACUTE ONLY): 12 min  Charges:    $Therapeutic Exercise: 8-22 mins PT General Charges $$ ACUTE PT VISIT: 1 Visit                     Blanchard Kelch PT Acute Rehabilitation Services Office [phone]     Rada Hay 10/14/2023, 1:01 PM

## 2023-10-14 NOTE — Progress Notes (Signed)
 10/14/23 1324  Hydro note  Subjective Assessment  Subjective Pt is a 78 year old male admitted on 09/27/23 with sepsis. Pt with Necrotic sacral, perineal, scrotal, and posterior thigh pressure wounds. Pt s/p LAPAROSCOPIC LOOP COLOSTOMY and EXCISIONAL SHARP DEBRIDEMENT OF 24 CM TALL X 16 CM WIDE X 4 CM DEEP SACRAL WOUND USING A SCALPEL AND ELECTROCAUTERY on 09/28/23. Pt was left on vent for Afib/shock state postop. Pt extubated 09/29/23. PMH Includes but is not limited to ca, GERD, fall, HLD, L TKA, TLIF L4-l5, ACDF and periprosthetic ORIF of LLE on 12/05/2022. Recent diagnosis of chronic cervical and lower brainstem myelomalacia, on chemotherapy.  Patient and Family Stated Goals to heal (wife and son present)  Date of Onset  (present on admission)  Prior Treatments none until this admission  Evaluation and Treatment  Evaluation and Treatment Procedures Explained to Patient/Family Yes  Evaluation and Treatment Procedures Patient unable to consent due to mental status  Pressure Injury 10/12/23 Sacrum Mid Stage 4 - Full thickness tissue loss with exposed bone, tendon or muscle. *PT ONLY* extensive, partially necrotic pressure injury covering sacrum, gluteal region, and posterior thighs.  Date First Assessed/Time First Assessed: 10/12/23 1545   Location: (c) Sacrum  Location Orientation: Mid  Staging: Stage 4 - Full thickness tissue loss with exposed bone, tendon or muscle.  Wound Description (Comments): *PT ONLY* extensive, partially ...  Wound Image   Dressing Type Gauze (Comment);Foam - Lift dressing to assess site every shift;ABD;Moist to dry (medihoney smaller wound distal  thigh)  Dressing Clean, Dry, Intact  Dressing Change Frequency Daily  State of Healing Early/partial granulation  Site / Wound Assessment Bleeding;Brown;Granulation tissue;Painful;Pink;Yellow  % Wound base Red or Granulating 60%  % Wound base Yellow/Fibrinous Exudate 30%  Peri-wound Assessment Intact  Wound Length (cm) 36  cm  Wound Width (cm) 29 cm  Wound Depth (cm) 8 cm  Wound Surface Area (cm^2) 1044 cm^2  Wound Volume (cm^3) 8352 cm^3  Tunneling (cm) 9-10 (left gluteal area)  Undermining (cm) 3-6  Margins Unattached edges (unapproximated)  Drainage Amount Copious  Drainage Description Serosanguineous  Treatment Hydrotherapy (Pulse lavage);Packing (Saline gauze)  Hydrotherapy  Pulsed lavage therapy - wound location sacral/gluteal area  and proximal thighs  Pulsed Lavage with Suction (psi) 12 psi  Pulsed Lavage with Suction - Normal Saline Used 1000 mL  Pulsed Lavage Tip Tip with splash shield  Selective Debridement (non-excisional)  Selective Debridement (non-excisional) - Location sacral, gluteal area, proximal thighs  Selective Debridement (non-excisional) - Tools Used  (debridement performed by surgery PA)  Selective Debridement (non-excisional) - Tissue Removed brown-black necrotic adipose in wound bed  Wound Therapy - Assess/Plan/Recommendations  Wound Therapy - Clinical Statement Pt admitted with extensive necrotic sacral, perineal, scrotal, and posterior thigh pressure wounds.  Wounds likely due to combination of presence of urines/feces and pressure injury.Pt seen today, 10/14/23  with surgical PA and RN.  Plan is for hydrotherapy and w>d dressing changes to sacral/gluteal area.  medihoney to  small wound posterior thighs;  Pt would benefit from continued hydrotherapy to assist with removal of nonviable tissue and promote wound healing.  pt on  Air mattress for pressure relief.  will continue to follow with surgical team  Wound Therapy - Functional Problem List decreased mobility, pain  Factors Delaying/Impairing Wound Healing Multiple medical problems;Immobility;Infection - systemic/local;Other (comment) (chemo)  Hydrotherapy Plan Dressing change;Patient/family education;Pulsatile lavage with suction;Debridement  Wound Therapy - Frequency 2X / week  Wound Therapy - Current Recommendations Other  (comment);WOC nurse (  CCS)  Wound Therapy - Follow Up Recommendations dressing changes by RN;f/u pulsed lavage with suction;f/u selective debridement  Wound Therapy Goals - Improve the function of patient's integumentary system by progressing the wound(s) through the phases of wound healing by:  Decrease Necrotic Tissue to 30  Decrease Necrotic Tissue - Progress Progressing toward goal  Increase Granulation Tissue to 70  Increase Granulation Tissue - Progress Progressing toward goal  Additional Wound Therapy Goal Pt and family involved in pressure relief such as monitoring bony prominences and turning, positioning  Additional Wound Therapy Goal - Progress Progressing toward goal  Goals/treatment plan/discharge plan were made with and agreed upon by patient/family Yes  Time For Goal Achievement 2 weeks  Wound Therapy - Potential for Goals Poor    Blanchard Kelch PT Acute Rehabilitation Services Office [phone]  [phone]  Deb>2, dress, tip and gun

## 2023-10-14 NOTE — Plan of Care (Signed)
  Problem: Clinical Measurements: Goal: Ability to maintain clinical measurements within normal limits will improve Outcome: Progressing Goal: Will remain free from infection Outcome: Progressing   Problem: Pain Managment: Goal: General experience of comfort will improve and/or be controlled Outcome: Progressing   Problem: Activity: Goal: Risk for activity intolerance will decrease Outcome: Not Progressing   Problem: Skin Integrity: Goal: Risk for impaired skin integrity will decrease Outcome: Not Progressing

## 2023-10-14 NOTE — Progress Notes (Signed)
 NAME:  Derek [name], MRN:  [id number], DOB:  [birth year], LOS: 18 ADMISSION DATE:  09/26/2023, CONSULTATION DATE: 09/27/2023 REFERRING MD: Jearld Fenton - EDP, CHIEF COMPLAINT: Hypotension   History of Present Illness:  78 year old man with multiple myeloma (on chemotherapy), dyslipidemia, and GERD.  Brought in by family for increasing lower extremity edema, perineal weeping sores, and worsening generalized weakness for 1-2 weeks.  At baseline patient relatively independent.  He is experience rapid decline in function recently.  Per patient report because he is difficult to move he has been left in his urine and feces.  Per wife the patient experiences pain and does not want to be moved. Smoker. In ED, he was found to be hypotensive and given 2.5 L LR, Vanco, Rocephin, Cefepime, Levophed @ 7 mcg/min now, Rx for hyperK, and HTS for hypoNa.    Pertinent Medical History:  Multiple myeloma on chemotherapy, dyslipidemia, GERD  Significant Hospital Events: Including procedures, antibiotic start and stop dates in addition to other pertinent events   2/11 Admit, CCS consult 2/12 LAPAROSCOPIC LOOP COLOSTOMY, EXCISIONAL SHARP DEBRIDEMENT OF 24 CM TALL X 16 CM WIDE X 4 CM DEEP SACRAL WOUND USING A SCALPEL AND ELECTROCAUTERY; Left on vent for Afib/shock state postop 2/13 Extubated  2/15 On amiodarone, on Levophed 2/17 To OR for debridement, fourniers and nec fasc seen 2/19 Return to OR for debridement. Hgb down to 6.8, 2u PRBC 2/21 Remains on low dose pressors.  2/26 PCCM reconsulted for low-dose vasopressor requirements 2/28 Remains on low dose pressors, poor oral intake. Ongoing hydrotherapy   Interim History / Subjective:  Seen sitting up in bed with breakfast try prepped and in front of him but not consuming anything   Objective:  Blood pressure (!) 79/28, pulse (!) 59, temperature (!) 97.5 F (36.4 C), temperature source Oral, resp. rate 10, height 5\' 6"  (1.676 m), weight 111 kg, SpO2 100%.         Intake/Output Summary (Last 24 hours) at 10/14/2023 4098 Last data filed at 10/14/2023 0600 Gross per 24 hour  Intake 503.07 ml  Output 2775 ml  Net -2271.93 ml   Filed Weights   10/10/23 0700 10/12/23 0702 10/13/23 0500  Weight: 113 kg 118 kg 111 kg   Physical Examination: General: Acute on chronically ill appearing elderly  male lying in bed, in NAD HEENT: [place]/AT, MM pink/moist, PERRL,  Neuro: Alert and oriented x3, non-focal  CV: s1s2 regular rate and rhythm, no murmur, rubs, or gallops,  PULM:  Clear to auscultation, no increased work of breathing, no added breath sounds  GI: soft, bowel sounds active in all 4 quadrants, non-tender, non-distended, tolerating oral diet Extremities: warm/dry, generalized anasarca with weeping wound Skin: warm and dry, known large sacral wound, see media tab for photos   Resolved problems   Hypokalemia Hypomagnesemia Hypophosphatemia Acute delirium versus encephalopathy  Assessment & Plan:   Septic shock secondary to large necrotic sacral ulcer s/p debridement  -Cultures with rare E.coli, rare Enterococcus faecalis, rare Pseudomonas aeruginosa, few bacteriodes thetaiotaomicro- -Per CCS, no role for OR debridement, agree with ongoing hydrotherapy and sharp debridement at bedside Diverting ostomy 09/28/2023 P: Continue low dose pressors for MAP goal > 65 Continue PO midodrine Optimize nutrition  Scheduled supplemental albumin x4 doses  Follow cultures  Trend CBC and fever curve  Local wound care/hydrotherapy  Pressure alleviating devices  Pending LTAC   Atrial fibrillation - Rate controlled  P: Continuous telemetry  Optimize electrolytes  Weight bases Lovenox for AC  Left femoral  DVT P: Lovenox as above   ABLA - presumed acute after wound debridement P: Trend CBC  Transfuse per protocol  Hgb goal > 7  Wheelchair-bound at baseline Failure to thrive History of multiple myeloma on chemotherapy P: PT/OT/SLP as able    Anasarca with volume overload  Hypoalbuminemia  P: Optimize nutation as able  Protein supplementation Supplement albumin    Best Practice: (right click and "Reselect all SmartList Selections" daily)   Diet/type: Full liquid DVT prophylaxis: heparin infusion Pressure ulcer(s): present on admission   GI prophylaxis: oral ppi  Lines: Central line and Arterial Line Foley:  Yes, and it is still needed Code Status:  full code Last date of multidisciplinary goals of care discussion [palliative care medicine continues to follow]  Critical care time:   CRITICAL CARE Performed by: Levi Klaiber D. Harris  Total critical care time: 35 minutes  Critical care time was exclusive of separately billable procedures and treating other patients.  Critical care was necessary to treat or prevent imminent or life-threatening deterioration.  Critical care was time spent personally by me on the following activities: development of treatment plan with patient and/or surrogate as well as nursing, discussions with consultants, evaluation of patient's response to treatment, examination of patient, obtaining history from patient or surrogate, ordering and performing treatments and interventions, ordering and review of laboratory studies, ordering and review of radiographic studies, pulse oximetry and re-evaluation of patient's condition.  Magally Vahle D. Harris, NP-C [facility] Personal contact information can be found on Amion  If no contact or response made please call [phone] 10/14/2023, 9:29 AM

## 2023-10-14 NOTE — Progress Notes (Signed)
 PHARMACY - ANTICOAGULATION CONSULT NOTE  Pharmacy Consult for Lovenox Indication: DVT  No Known Allergies  Patient Measurements: Height: 5\' 6"  (167.6 cm) Weight: 111 kg (244 lb 11.4 oz) IBW/kg (Calculated) : 63.8  Vital Signs: Temp: 97.5 F (36.4 C) (02/28 0833) Temp Source: Oral (02/28 1205) BP: 113/47 (02/28 1015) Pulse Rate: 64 (02/28 1015)  Labs: Recent Labs    10/12/23 0506 10/13/23 0615 10/14/23 0510  HGB 7.9* 8.7* 7.6*  HCT 24.8* 27.2* 24.5*  PLT 352 381 308  CREATININE 0.48* 0.58* 0.59*    Estimated Creatinine Clearance: 91.9 mL/min (A) (by C-G formula based on SCr of 0.59 mg/dL (L)).   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  -  UFH started on 2/15, stopped 2/19. Resumed 2/20. - Tranfused: 2/11, 2/12, 2/19. IV iron given - 2/21 UFH > LMWH due to concern for heparin resistance - 2/23  Anti-Xa = 0.65 after 4th dose - 2/28: Hgb back down to 7.6, Plts 308 (up and down)  Goal of Therapy:  Anti-Xa level 0.6-1 units/ml 4hrs after LMWH dose given Monitor platelets by anticoagulation protocol: Yes   Plan:  - Decrease Lovenox to 110mg  SQ q 12 hrs.due to weight decrease CBC at least q72h while on LMWH   Adrienna Karis S. Merilynn Finland, PharmD, BCPS Clinical Staff Pharmacist Misty Stanley Stillinger 10/14/2023,12:28 PM

## 2023-10-15 DIAGNOSIS — A419 Sepsis, unspecified organism: Secondary | ICD-10-CM | POA: Diagnosis not present

## 2023-10-15 DIAGNOSIS — R6521 Severe sepsis with septic shock: Secondary | ICD-10-CM | POA: Diagnosis not present

## 2023-10-15 DIAGNOSIS — R410 Disorientation, unspecified: Secondary | ICD-10-CM | POA: Diagnosis not present

## 2023-10-15 DIAGNOSIS — M726 Necrotizing fasciitis: Secondary | ICD-10-CM | POA: Diagnosis not present

## 2023-10-15 DIAGNOSIS — M4628 Osteomyelitis of vertebra, sacral and sacrococcygeal region: Secondary | ICD-10-CM | POA: Diagnosis not present

## 2023-10-15 LAB — CORTISOL: Cortisol, Plasma: 16.5 ug/dL

## 2023-10-15 LAB — CBC
HCT: 24.4 % — ABNORMAL LOW (ref 39.0–52.0)
Hemoglobin: 7.3 g/dL — ABNORMAL LOW (ref 13.0–17.0)
MCH: 31.2 pg (ref 26.0–34.0)
MCHC: 29.9 g/dL — ABNORMAL LOW (ref 30.0–36.0)
MCV: 104.3 fL — ABNORMAL HIGH (ref 80.0–100.0)
Platelets: 299 10*3/uL (ref 150–400)
RBC: 2.34 MIL/uL — ABNORMAL LOW (ref 4.22–5.81)
RDW: 16.9 % — ABNORMAL HIGH (ref 11.5–15.5)
WBC: 6.1 10*3/uL (ref 4.0–10.5)
nRBC: 0 % (ref 0.0–0.2)

## 2023-10-15 LAB — BASIC METABOLIC PANEL
Anion gap: 6 (ref 5–15)
BUN: 12 mg/dL (ref 8–23)
CO2: 30 mmol/L (ref 22–32)
Calcium: 9.1 mg/dL (ref 8.9–10.3)
Chloride: 106 mmol/L (ref 98–111)
Creatinine, Ser: 0.55 mg/dL — ABNORMAL LOW (ref 0.61–1.24)
GFR, Estimated: >60 mL/min (ref >60)
Glucose, Bld: 106 mg/dL — ABNORMAL HIGH (ref 70–99)
Potassium: 3.7 mmol/L (ref 3.5–5.1)
Sodium: 142 mmol/L (ref 135–145)

## 2023-10-15 LAB — GLUCOSE, CAPILLARY
Glucose-Capillary: 101 mg/dL — ABNORMAL HIGH (ref 70–99)
Glucose-Capillary: 147 mg/dL — ABNORMAL HIGH (ref 70–99)
Glucose-Capillary: 216 mg/dL — ABNORMAL HIGH (ref 70–99)
Glucose-Capillary: 141 mg/dL — ABNORMAL HIGH (ref 70–99)

## 2023-10-15 MED ORDER — HYDROCORTISONE SOD SUC (PF) 100 MG IJ SOLR
100.0000 mg | Freq: Three times a day (TID) | INTRAMUSCULAR | Status: DC
  Administered 2023-10-15 – 2023-10-17 (×7): 100 mg via INTRAVENOUS
  Filled 2023-10-15 (×5): qty 2

## 2023-10-15 MED ORDER — POTASSIUM CHLORIDE CRYS ER 20 MEQ PO TBCR
40.0000 meq | EXTENDED_RELEASE_TABLET | Freq: Once | ORAL | Status: AC
  Administered 2023-10-15: 40 meq via ORAL
  Filled 2023-10-15: qty 2

## 2023-10-15 NOTE — Progress Notes (Signed)
 [facility] ADULT ICU REPLACEMENT PROTOCOL   The patient does apply for the [facility] Adult ICU Electrolyte Replacment Protocol based on the criteria listed below:   1.Exclusion criteria: TCTS, ECMO, Dialysis, and Myasthenia Gravis patients 2. Is GFR >/= 30 ml/min? Yes.    Patient's GFR today is >60 3. Is SCr </= 2? Yes.   Patient's SCr is 0.55 mg/dL 4. Did SCr increase >/= 0.5 in 24 hours? No. 5.Pt's weight >40kg  Yes.   6. Abnormal electrolyte(s): K + = 3.7  7. Electrolytes replaced per protocol 8.  Call MD STAT for K+ </= 2.5, Phos </= 1, or Mag </= 1 Physician:  Marcelline Deist, eMD   Faye Ramsay 10/15/2023 6:26 AM

## 2023-10-15 NOTE — Plan of Care (Signed)
  Problem: Clinical Measurements: Goal: Diagnostic test results will improve Outcome: Progressing Goal: Respiratory complications will improve Outcome: Progressing   Problem: Activity: Goal: Risk for activity intolerance will decrease Outcome: Progressing   Problem: Nutrition: Goal: Adequate nutrition will be maintained Outcome: Progressing   Problem: Pain Managment: Goal: General experience of comfort will improve and/or be controlled Outcome: Progressing   Problem: Clinical Measurements: Goal: Ability to maintain clinical measurements within normal limits will improve Outcome: Not Progressing

## 2023-10-15 NOTE — Progress Notes (Signed)
 NAME:  Derek [name], MRN:  [id number], DOB:  May 11, 1946, LOS: 19 ADMISSION DATE:  09/26/2023, CONSULTATION DATE: 09/27/2023 REFERRING MD: Jearld Fenton - EDP, CHIEF COMPLAINT: Hypotension   History of Present Illness:  78 year old man with multiple myeloma (on chemotherapy), dyslipidemia, and GERD.  Brought in by family for increasing lower extremity edema, perineal weeping sores, and worsening generalized weakness for 1-2 weeks.  At baseline patient relatively independent.  He is experience rapid decline in function recently.  Per patient report because he is difficult to move he has been left in his urine and feces.  Per wife the patient experiences pain and does not want to be moved. Smoker. In ED, he was found to be hypotensive and given 2.5 L LR, Vanco, Rocephin, Cefepime, Levophed @ 7 mcg/min now, Rx for hyperK, and HTS for hypoNa.    Pertinent Medical History:  Multiple myeloma on chemotherapy, dyslipidemia, GERD  Significant Hospital Events: Including procedures, antibiotic start and stop dates in addition to other pertinent events   2/11 Admit, CCS consult 2/12 LAPAROSCOPIC LOOP COLOSTOMY, EXCISIONAL SHARP DEBRIDEMENT OF 24 CM TALL X 16 CM WIDE X 4 CM DEEP SACRAL WOUND USING A SCALPEL AND ELECTROCAUTERY; Left on vent for Afib/shock state postop 2/13 Extubated  2/15 On amiodarone, on Levophed 2/17 To OR for debridement, fourniers and nec fasc seen 2/19 Return to OR for debridement. Hgb down to 6.8, 2u PRBC 2/21 Remains on low dose pressors.  2/26 PCCM reconsulted for low-dose vasopressor requirements 2/28 Remains on low dose pressors, poor oral intake. Ongoing hydrotherapy . Seen sitting up in bed with breakfast try prepped and in front of him but not consuming anything   Interim History / Subjective:   3/1 -he is sitting in the chair with his boots on.  He is watching TV.  He is alert and oriented on room air.  But he still on low-dose Levophed.  Resolved on 09/27/2023 was 31.8.  He is on  midodrine   Objective:  Blood pressure (!) 110/37, pulse 64, temperature 98 F (36.7 C), temperature source Axillary, resp. rate 11, height 5\' 6"  (1.676 m), weight 108 kg, SpO2 97%.        Intake/Output Summary (Last 24 hours) at 10/15/2023 1135 Last data filed at 10/15/2023 8469 Gross per 24 hour  Intake 1142.21 ml  Output 1500 ml  Net -357.79 ml   Filed Weights   10/12/23 0702 10/13/23 0500 10/15/23 0500  Weight: 118 kg 111 kg 108 kg   Physical Examination: General: Acute on chronically ill appearing elderly  male lying in bed, in NAD HEENT: [place]/AT, MM pink/moist, PERRL,  Neuro: Alert and oriented x3, non-focal  CV: s1s2 regular rate and rhythm, no murmur, rubs, or gallops,  PULM:  Clear to auscultation, no increased work of breathing, no added breath sounds  GI: soft, bowel sounds active in all 4 quadrants, non-tender, non-distended, tolerating oral diet Extremities: warm/dry, generalized anasarca with weeping wound Skin: warm and dry, known large sacral wound, see media tab for photos    General Appearance:  OBESe. No distress Head:  Normocephalic, without obvious abnormality, atraumatic Eyes:  PERRL - yes, conjunctiva/corneas - muddy     Ears:  Normal external ear canals, both ears Nose:  G tube - no Throat:  ETT TUBE - no , OG tube - no Neck:  Supple,  No enlargement/tenderness/nodules Lungs: Clear to auscultation bilaterally,  Heart:  S1 and S2 normal, no murmur, CVP - x.  Pressors - LEVOPHEd Abdomen:  Soft,  no masses, no organomegaly Genitalia / Rectal:  Not done Extremities:  Extremities- intct Skin:  ntact in exposed areas . Sacral area - HUGE DECUB Neurologic:  Sedation - none -> RASS - +1 . Moves all 4s - yes. CAM-ICU - neg . Orientation - x3     Resolved problems   Hypokalemia Hypomagnesemia Hypophosphatemia Acute delirium versus encephalopathy  Assessment & Plan:   Septic shock secondary to large necrotic sacral ulcer s/p debridement  -Cultures with  rare E.coli, rare Enterococcus faecalis, rare Pseudomonas aeruginosa, few bacteriodes thetaiotaomicro- -Per CCS, no role for OR debridement, agree with ongoing hydrotherapy and sharp debridement at bedside Diverting ostomy 09/28/2023  3/1 =- on levophed  P: SEND CORTISOL and sTART HYDROCORT 100mg  tid to help dc levophed Continue low dose pressors for MAP goal > 65 Continue PO midodrine Optimize nutrition  Scheduled supplemental albumin x4 doses  Follow cultures  Trend CBC and fever curve  Local wound care/hydrotherapy  Pressure alleviating devices  Pending LTAC   Atrial fibrillation - Rate controlled  P: Continuous telemetry  Optimize electrolytes  Weight bases Lovenox for AC  Left femoral DVT P: Lovenox as above   ABLA - presumed acute after wound debridement P: Trend CBC  Transfuse per protocol  Hgb goal > 7  Wheelchair-bound at baseline Failure to thrive History of multiple myeloma on chemotherapy P: PT/OT/SLP as able   Anasarca with volume overload  Hypoalbuminemia  P: Optimize nutation as able  Protein supplementation Supplement albumin    Best Practice: (right click and "Reselect all SmartList Selections" daily)   Diet/type: Full liquid DVT prophylaxis: heparin infusion Pressure ulcer(s): present on admission   GI prophylaxis: oral ppi  Lines: Central line and Arterial Line Foley:  Yes, and it is still needed Code Status:  full code Last date of multidisciplinary goals of care discussion [palliative care medicine continues to follow]       ATTESTATION & SIGNATURE    Dr. Kalman Shan, M.D., [facility].C.P Pulmonary and Critical Care Medicine Staff Physician, [facility] Pager: [phone], If no answer or between  15:00h - 7:00h: call [phone]  10/15/2023 11:35 AM    LABS    PULMONARY No results for input(s): "PHART", "PCO2ART", "PO2ART", "HCO3", "TCO2", "O2SAT" in the last 168  hours.  Invalid input(s): "PCO2", "PO2"  CBC Recent Labs  Lab 10/13/23 0615 10/14/23 0510 10/15/23 0515  HGB 8.7* 7.6* 7.3*  HCT 27.2* 24.5* 24.4*  WBC 7.5 7.8 6.1  PLT 381 308 299    COAGULATION No results for input(s): "INR" in the last 168 hours.  CARDIAC  No results for input(s): "TROPONINI" in the last 168 hours. No results for input(s): "PROBNP" in the last 168 hours.   CHEMISTRY Recent Labs  Lab 10/09/23 0410 10/10/23 0327 10/11/23 0448 10/12/23 0506 10/13/23 0615 10/14/23 0510 10/15/23 0515  NA 137 139 141 140 138 138 142  K 3.6 4.0 3.8 3.7 3.8 3.8 3.7  CL 105 105 105 104 103 104 106  CO2 25 26 27 26 27 28 30   GLUCOSE 130* 122* 90 95 107* 156* 106*  BUN 12 18 22 20 17 14 12   CREATININE 0.56* 0.67 0.59* 0.48* 0.58* 0.59* 0.55*  CALCIUM 8.0* 8.3* 8.6* 8.5* 8.4* 8.3* 9.1  MG 1.6* 2.1  --  1.8 1.8  --   --   PHOS 1.9* 2.2*  --  2.4* 3.0  --   --  Estimated Creatinine Clearance: 90.6 mL/min (A) (by C-G formula based on SCr of 0.55 mg/dL (L)).   LIVER Recent Labs  Lab 10/14/23 0510  AST 14*  ALT 20  ALKPHOS 115  BILITOT 0.6  PROT 5.0*  ALBUMIN 1.7*     INFECTIOUS No results for input(s): "LATICACIDVEN", "PROCALCITON" in the last 168 hours.   ENDOCRINE CBG (last 3)  Recent Labs    10/14/23 1641 10/14/23 2136 10/15/23 0803  GLUCAP 114* 112* 101*         IMAGING x48h  - image(s) personally visualized  -   highlighted in bold No results found.

## 2023-10-15 NOTE — Progress Notes (Signed)
 Progress Note   Patient: Derek [name] [id number]:[id number] DOB: Jun 16, 1946 DOA: 09/26/2023     19 DOS: the patient was seen and examined on 10/15/2023   Brief hospital course: 78 year old man complex hospitalization admitted 2/10.  PMH includes multiple myeloma on chemotherapy.  Presented with generalized weakness, weeping sores, increasing lower extremity edema.  Admitted for hyperkalemia, AKI.  Rapidly decompensated and was transferred to ICU and care taken over by critical care for septic shock with multiorgan failure in wheelchair-bound patient with severe leg wounds, third spacing, and multiple myeloma on chemo.  He was quickly seen by general surgery for necrotic sacral, perineal, scrotal and posterior thigh pressure wounds.  Underwent diverting loop colostomy, and debridement of wounds.  Developed necrotizing fasciitis and underwent multiple debridements.  At this point remains on vasopressors for hypotension.  Has been referred to [facility].  Consultants PCCM 2/11 Palliative medicine 2/11 General Surgery 2/11 Infectious disease 2/25  Procedures/Events 2/11 Admit, CCS consult 2/12 LAPAROSCOPIC LOOP COLOSTOMY, EXCISIONAL SHARP DEBRIDEMENT OF 24 CM TALL X 16 CM WIDE X 4 CM DEEP SACRAL WOUND USING A SCALPEL AND ELECTROCAUTERY; Left on vent for Afib/shock state postop.  Perioperative atrial fibrillation.   2/13 Extubated  2/15 atrial fibrillation recurred.  On amiodarone, on Levophed 2/17 To OR for debridement.  Status post excisional debridement of back, sacrum, hips, thighs.  Postoperative diagnosis necrotizing fasciitis and Fournier's gangrene left anterior/posterior thigh, right posterior thigh, sacrum, back, left gluteus 2/19 Return to OR for debridement of above.  Hgb down to 6.8, 2u PRBC 2/21 Remains on low dose pressors 2/24 transferred to [facility] 2/26 PCCM reconsulted for low-dose vasopressor requirements  Assessment and Plan: Septic shock secondary to large necrotic sacral ulcer s/p  debridement Necrotizing fasciitis Sacral osteomyelitis Status post laparoscopic loop colostomy Status post excisional debridement back, sacrum, hips, thighs for necrotizing fasciitis Remains on pressors, managed as per critical care.   No signs of worsening infection.   Continue wound care as directed by general surgery  Not a flap candidate per plastic surgery. Continue Zosyn through 10/15/2023.   Reengage ID if patient undergoes a flap or is considered a flap candidate.   Acute delirium Waxes and wanes.  Currently doing well.   ABLA Multiple myeloma currently undergoing chemotherapy  chronic anemia  Thrombocytopenia -- resolved Hb as low as 6.8, received multiple blood transfusions.  Baseline hemoglobin ~ 9 gm.   Hemoglobin stable at 7.3.  Normal variation seen.   Perioperative atrial fibrillation  Remains in sinus rhythm.  On anticoagulation for femoral DVT.   Left femoral DVT Continue therapeutic Lovenox per pharmacy.  Can change to DOAC at any time when cleared per surgery.   Anasarca with volume overload Hypoalbuminemia Echo with normal EF 07/2023.    Hypokalemia Hypomagnesemia Hypophosphatemia Repleted   AKI -- resolved Baseline around 1.0.  Peak 4.43. Currently creatinine is better than baseline.   Severe hyponatremia -- resolved   Failure to thrive Debility/deconditioning, wheelchair bound at baseline Continue supportive care, palliative care input appreciated at this time opting for aggressive measures.  Continue PT OT   Morbid Obesity Patient's Body mass index is 40.21 kg/m.     Unstageable Pressure Injury to sacrum/buttocks/B ischium  Full thickness wounds posterior lower legs likely r/t venous insufficiency  Deep Tissue Pressure Injury L heel  Pressure Injury POA: Yes   Pelvic retroperitoneal adenopathy, stable from 09/27/2023 but enlarged from 04/14/2023. Findings may be reactive in etiology. Recommend attention on follow-up or consider CT  abdomen pelvis with contrast  in 3 months in further initial evaluation (from 10/03/2023)   Continues on vasopressors.  Complex wound care.  Referral to [facility] for consideration.       Subjective:  No issues overnight per nursing.  This morning complained of some stomach discomfort.  Was given Zofran. Patient reports some mild stomach discomfort.  Physical Exam: Vitals:   10/15/23 0630 10/15/23 0645 10/15/23 0700 10/15/23 0800  BP: (!) 117/40 (!) 128/42 (!) 117/37 (!) 110/37  Pulse: 71 66 68 64  Resp: 12 13 10 11   Temp:      TempSrc:      SpO2: 97% 97% 96% 97%  Weight:      Height:       Physical Exam Vitals reviewed.  Constitutional:      General: He is not in acute distress.    Appearance: He is not ill-appearing or toxic-appearing.  Cardiovascular:     Rate and Rhythm: Normal rate and regular rhythm.     Heart sounds: No murmur heard.    Comments: Telemetry SR Pulmonary:     Effort: Pulmonary effort is normal. No respiratory distress.     Breath sounds: No wheezing, rhonchi or rales.  Musculoskeletal:     Right lower leg: No edema.     Left lower leg: No edema.  Neurological:     Mental Status: He is alert.  Psychiatric:        Mood and Affect: Mood normal.        Behavior: Behavior normal.     Data Reviewed: CBG stable Hgb stable 7.3  Family Communication: none present  Disposition: Status is: Inpatient Remains inpatient appropriate because: on pressor, complex wound care     Time spent: 20 has been referred to [facility].  Minutes  Author: Brendia Sacks, MD 10/15/2023 9:59 AM  For on call review [email or web address].

## 2023-10-16 DIAGNOSIS — M726 Necrotizing fasciitis: Secondary | ICD-10-CM | POA: Diagnosis not present

## 2023-10-16 DIAGNOSIS — A419 Sepsis, unspecified organism: Secondary | ICD-10-CM | POA: Diagnosis not present

## 2023-10-16 DIAGNOSIS — M4628 Osteomyelitis of vertebra, sacral and sacrococcygeal region: Secondary | ICD-10-CM | POA: Diagnosis not present

## 2023-10-16 DIAGNOSIS — R6521 Severe sepsis with septic shock: Secondary | ICD-10-CM | POA: Diagnosis not present

## 2023-10-16 LAB — GLUCOSE, CAPILLARY
Glucose-Capillary: 122 mg/dL — ABNORMAL HIGH (ref 70–99)
Glucose-Capillary: 150 mg/dL — ABNORMAL HIGH (ref 70–99)
Glucose-Capillary: 161 mg/dL — ABNORMAL HIGH (ref 70–99)
Glucose-Capillary: 129 mg/dL — ABNORMAL HIGH (ref 70–99)

## 2023-10-16 NOTE — Progress Notes (Signed)
 NAME:  Derek [name], MRN:  [id number], DOB:  Jul 19, 1946, LOS: 20 ADMISSION DATE:  09/26/2023, CONSULTATION DATE: 09/27/2023 REFERRING MD: Jearld Fenton - EDP, CHIEF COMPLAINT: Hypotension   History of Present Illness:  78 year old man with multiple myeloma (on chemotherapy), dyslipidemia, and GERD.  Brought in by family for increasing lower extremity edema, perineal weeping sores, and worsening generalized weakness for 1-2 weeks.  At baseline patient relatively independent.  He is experience rapid decline in function recently.  Per patient report because he is difficult to move he has been left in his urine and feces.  Per wife the patient experiences pain and does not want to be moved. Smoker. In ED, he was found to be hypotensive and given 2.5 L LR, Vanco, Rocephin, Cefepime, Levophed @ 7 mcg/min now, Rx for hyperK, and HTS for hypoNa.    Pertinent Medical History:  Multiple myeloma on chemotherapy, dyslipidemia, GERD  Significant Hospital Events: Including procedures, antibiotic start and stop dates in addition to other pertinent events   2/11 Admit, CCS consult 2/12 LAPAROSCOPIC LOOP COLOSTOMY, EXCISIONAL SHARP DEBRIDEMENT OF 24 CM TALL X 16 CM WIDE X 4 CM DEEP SACRAL WOUND USING A SCALPEL AND ELECTROCAUTERY; Left on vent for Afib/shock state postop 2/13 Extubated  2/15 On amiodarone, on Levophed 2/17 To OR for debridement, fourniers and nec fasc seen 2/19 Return to OR for debridement. Hgb down to 6.8, 2u PRBC 2/21 Remains on low dose pressors.  2/26 PCCM reconsulted for low-dose vasopressor requirements 2/28 Remains on low dose pressors, poor oral intake. Ongoing hydrotherapy . Seen sitting up in bed with breakfast try prepped and in front of him but not consuming anything  3/1 -he is sitting in the chair with his boots on.  He is watching TV.  He is alert and oriented on room air.  But he still on low-dose Levophed.  Resolved on 09/27/2023 was 31.8.  He is on midodrine -. START HYDROCORT (CORTISOL  16)  Interim History / Subjective:   3/2 - O levophed. MAP 72 , sB P 130 and Diast LOW at 37. Alert and in no distress.   Objective:  Blood pressure (!) 128/44, pulse 88, temperature 97.7 F (36.5 C), temperature source Axillary, resp. rate 19, height 5\' 6"  (1.676 m), weight 108 kg, SpO2 98%.        Intake/Output Summary (Last 24 hours) at 10/16/2023 1243 Last data filed at 10/16/2023 0900 Gross per 24 hour  Intake 463.19 ml  Output 2475 ml  Net -2011.81 ml   Filed Weights   10/13/23 0500 10/15/23 0500 10/16/23 0500  Weight: 111 kg 108 kg 108 kg       General Appearance:  OBESe. No distress Head:  Normocephalic, without obvious abnormality, atraumatic Eyes:  PERRL - yes, conjunctiva/corneas - muddy     Ears:  Normal external ear canals, both ears Nose:  G tube - no Throat:  ETT TUBE - no , OG tube - no Neck:  Supple,  No enlargement/tenderness/nodules Lungs: Clear to auscultation bilaterally,  Heart:  S1 and S2 normal, no murmur, CVP - x.  Pressors - LEVOPHEd Abdomen:  Soft, no masses, no organomegaly Genitalia / Rectal:  Not done Extremities:  Extremities- intct Skin:  ntact in exposed areas . Sacral area - HUGE DECUB Neurologic:  Sedation - none -> RASS - +1 . Moves all 4s - yes. CAM-ICU - neg . Orientation - x3     Resolved problems   Hypokalemia Hypomagnesemia Hypophosphatemia Acute delirium versus encephalopathy  Assessment & Plan:   Septic shock secondary to large necrotic sacral ulcer s/p debridement  -Cultures with rare E.coli, rare Enterococcus faecalis, rare Pseudomonas aeruginosa, few bacteriodes thetaiotaomicro- -Per CCS, no role for OR debridement, agree with ongoing hydrotherapy and sharp debridement at bedside Diverting ostomy 09/28/2023  3/1 =- on levophed - START HYDROCORT 3/2 0 Levophed - DIAST LOW  P: PLACE a-line to get BP correlated to cuff Chagne BP goal to sbp > 105 and MAP > 55 Conitnue hydrocort foir now Continue PO  midodrine Optimize nutrition  Scheduled supplemental albumin x4 doses  Follow cultures  Trend CBC and fever curve  Local wound care/hydrotherapy  Pressure alleviating devices  Pending LTAC   Atrial fibrillation - Rate controlled  P: Continuous telemetry  Optimize electrolytes  Weight bases Lovenox for AC  Left femoral DVT P: Lovenox as above   ABLA - presumed acute after wound debridement P: Trend CBC  Transfuse per protocol  Hgb goal > 7  Wheelchair-bound at baseline Failure to thrive History of multiple myeloma on chemotherapy P: PT/OT/SLP as able   Anasarca with volume overload  Hypoalbuminemia  P: Optimize nutation as able  Protein supplementation Supplement albumin    Best Practice: (right click and "Reselect all SmartList Selections" daily)   Diet/type: Full liquid DVT prophylaxis: heparin infusion Pressure ulcer(s): present on admission   GI prophylaxis: oral ppi  Lines: Central line and Arterial Line Foley:  Yes, and it is still needed Code Status:  full code Last date of multidisciplinary goals of care discussion [palliative care medicine continues to follow]       ATTESTATION & SIGNATURE    Dr. Kalman Shan, M.D., [facility].C.P Pulmonary and Critical Care Medicine Staff Physician, [facility] Pager: [phone], If no answer or between  15:00h - 7:00h: call [phone]  10/16/2023 12:43 PM    LABS    PULMONARY No results for input(s): "PHART", "PCO2ART", "PO2ART", "HCO3", "TCO2", "O2SAT" in the last 168 hours.  Invalid input(s): "PCO2", "PO2"  CBC Recent Labs  Lab 10/13/23 0615 10/14/23 0510 10/15/23 0515  HGB 8.7* 7.6* 7.3*  HCT 27.2* 24.5* 24.4*  WBC 7.5 7.8 6.1  PLT 381 308 299    COAGULATION No results for input(s): "INR" in the last 168 hours.  CARDIAC  No results for input(s): "TROPONINI" in the last 168 hours. No results for input(s): "PROBNP" in the last 168  hours.   CHEMISTRY Recent Labs  Lab 10/10/23 0327 10/11/23 0448 10/12/23 0506 10/13/23 0615 10/14/23 0510 10/15/23 0515  NA 139 141 140 138 138 142  K 4.0 3.8 3.7 3.8 3.8 3.7  CL 105 105 104 103 104 106  CO2 26 27 26 27 28 30   GLUCOSE 122* 90 95 107* 156* 106*  BUN 18 22 20 17 14 12   CREATININE 0.67 0.59* 0.48* 0.58* 0.59* 0.55*  CALCIUM 8.3* 8.6* 8.5* 8.4* 8.3* 9.1  MG 2.1  --  1.8 1.8  --   --   PHOS 2.2*  --  2.4* 3.0  --   --    Estimated Creatinine Clearance: 90.6 mL/min (A) (by C-G formula based on SCr of 0.55 mg/dL (L)).   LIVER Recent Labs  Lab 10/14/23 0510  AST 14*  ALT 20  ALKPHOS 115  BILITOT 0.6  PROT 5.0*  ALBUMIN 1.7*     INFECTIOUS No results for input(s): "LATICACIDVEN", "PROCALCITON" in the last 168 hours.  ENDOCRINE CBG (last 3)  Recent Labs    10/15/23 2131 10/16/23 0736 10/16/23 1131  GLUCAP 141* 122* 150*         IMAGING x48h  - image(s) personally visualized  -   highlighted in bold No results found.

## 2023-10-16 NOTE — Progress Notes (Signed)
 Progress Note   Patient: Derek [name] WUJ:[id number] DOB: 03/20/1946 DOA: 09/26/2023     20 DOS: the patient was seen and examined on 10/16/2023   Brief hospital course: 78 year old man complex hospitalization admitted 2/10.  PMH includes multiple myeloma on chemotherapy.  Presented with generalized weakness, weeping sores, increasing lower extremity edema.  Admitted for hyperkalemia, AKI.  Rapidly decompensated and was transferred to ICU and care taken over by critical care for septic shock with multiorgan failure in wheelchair-bound patient with severe leg wounds, third spacing, and multiple myeloma on chemo.  He was quickly seen by general surgery for necrotic sacral, perineal, scrotal and posterior thigh pressure wounds.  Underwent diverting loop colostomy, and debridement of wounds.  Developed necrotizing fasciitis and underwent multiple debridements.  At this point remains on vasopressors for hypotension.  Has been referred to [facility].  Consultants PCCM 2/11 Palliative medicine 2/11 General Surgery 2/11 Infectious disease 2/25  Procedures/Events 2/11 Admit, CCS consult 2/12 LAPAROSCOPIC LOOP COLOSTOMY, EXCISIONAL SHARP DEBRIDEMENT OF 24 CM TALL X 16 CM WIDE X 4 CM DEEP SACRAL WOUND USING A SCALPEL AND ELECTROCAUTERY; Left on vent for Afib/shock state postop.  Perioperative atrial fibrillation.   2/13 Extubated  2/15 atrial fibrillation recurred.  On amiodarone, on Levophed 2/17 To OR for debridement.  Status post excisional debridement of back, sacrum, hips, thighs.  Postoperative diagnosis necrotizing fasciitis and Fournier's gangrene left anterior/posterior thigh, right posterior thigh, sacrum, back, left gluteus 2/19 Return to OR for debridement of above.  Hgb down to 6.8, 2u PRBC 2/21 Remains on low dose pressors 2/24 transferred to [facility] 2/26 PCCM reconsulted for low-dose vasopressor requirements  Assessment and Plan: Septic shock secondary to large necrotic sacral ulcer s/p  debridement Necrotizing fasciitis Sacral osteomyelitis Status post laparoscopic loop colostomy Status post excisional debridement back, sacrum, hips, thighs for necrotizing fasciitis Remains on pressors, managed as per critical care.   No signs of worsening infection.   Continue wound care as directed by general surgery  Not a flap candidate per plastic surgery. Finished Zosyn 10/15/2023.   Reengage ID if patient undergoes a flap or is considered a flap candidate in the future   Acute delirium Resolved   ABLA Multiple myeloma currently undergoing chemotherapy  chronic anemia  Thrombocytopenia -- resolved Hb as low as 6.8, received multiple blood transfusions.  Baseline hemoglobin ~ 9 gm.   Hemoglobin stable   Perioperative atrial fibrillation  Remains in sinus rhythm.  On anticoagulation for femoral DVT.   Left femoral DVT Continue therapeutic Lovenox per pharmacy.  Can change to DOAC at any time    Anasarca with volume overload Hypoalbuminemia Echo with normal EF 07/2023.    Hypokalemia Hypomagnesemia Hypophosphatemia Repleted   AKI -- resolved Baseline around 1.0.  Peak 4.43. Currently creatinine is better than baseline.   Severe hyponatremia -- resolved   Failure to thrive Debility/deconditioning, wheelchair bound at baseline Continue supportive care, palliative care input appreciated at this time opting for aggressive measures.  Continue PT OT   Morbid Obesity Patient's Body mass index is 40.21 kg/m.     Unstageable Pressure Injury to sacrum/buttocks/B ischium  Full thickness wounds posterior lower legs likely r/t venous insufficiency  Deep Tissue Pressure Injury L heel  Pressure Injury POA: Yes   Pelvic retroperitoneal adenopathy, stable from 09/27/2023 but enlarged from 04/14/2023. Findings may be reactive in etiology. Recommend attention on follow-up or consider CT abdomen pelvis with contrast in 3 months in further initial evaluation (from 10/03/2023)    Continues on  vasopressors.  Complex wound care.  Referral to [facility] for consideration.  Stable for transfer to LTAC.       Subjective:  No complaints.  Feeling fine.  Physical Exam: Vitals:   10/16/23 0545 10/16/23 0700 10/16/23 0715 10/16/23 0736  BP: (!) 132/48 (!) 135/51 (!) 139/51   Pulse: 60 65 79   Resp: 13 14 18    Temp:    97.8 F (36.6 C)  TempSrc:    Oral  SpO2: 100% 100% 99%   Weight:      Height:       Physical Exam Vitals reviewed.  Constitutional:      General: He is not in acute distress.    Appearance: He is not ill-appearing or toxic-appearing.  Cardiovascular:     Rate and Rhythm: Normal rate and regular rhythm.     Heart sounds: No murmur heard. Pulmonary:     Effort: Pulmonary effort is normal. No respiratory distress.     Breath sounds: No wheezing, rhonchi or rales.  Neurological:     Mental Status: He is alert.  Psychiatric:        Behavior: Behavior normal.     Data Reviewed: CBG stable  Family Communication: spoke w/ wife last night  Disposition: Status is: Inpatient Remains inpatient appropriate because: pressor, complex wound care     Time spent: 35 minutes  Author: Brendia Sacks, MD 10/16/2023 8:44 AM  For on call review [email or web address].

## 2023-10-17 ENCOUNTER — Inpatient Hospital Stay (HOSPITAL_COMMUNITY)

## 2023-10-17 DIAGNOSIS — A419 Sepsis, unspecified organism: Secondary | ICD-10-CM | POA: Diagnosis not present

## 2023-10-17 DIAGNOSIS — R6521 Severe sepsis with septic shock: Secondary | ICD-10-CM | POA: Diagnosis not present

## 2023-10-17 DIAGNOSIS — R059 Cough, unspecified: Secondary | ICD-10-CM

## 2023-10-17 DIAGNOSIS — I4891 Unspecified atrial fibrillation: Secondary | ICD-10-CM | POA: Diagnosis not present

## 2023-10-17 DIAGNOSIS — M726 Necrotizing fasciitis: Secondary | ICD-10-CM | POA: Diagnosis not present

## 2023-10-17 DIAGNOSIS — M4628 Osteomyelitis of vertebra, sacral and sacrococcygeal region: Secondary | ICD-10-CM | POA: Diagnosis not present

## 2023-10-17 LAB — MAGNESIUM: Magnesium: 2 mg/dL (ref 1.7–2.4)

## 2023-10-17 LAB — GLUCOSE, CAPILLARY
Glucose-Capillary: 85 mg/dL (ref 70–99)
Glucose-Capillary: 143 mg/dL — ABNORMAL HIGH (ref 70–99)
Glucose-Capillary: 211 mg/dL — ABNORMAL HIGH (ref 70–99)

## 2023-10-17 LAB — LACTIC ACID, PLASMA: Lactic Acid, Venous: 1.3 mmol/L (ref 0.5–1.9)

## 2023-10-17 LAB — PHOSPHORUS: Phosphorus: 2.3 mg/dL — ABNORMAL LOW (ref 2.5–4.6)

## 2023-10-17 LAB — PROCALCITONIN: Procalcitonin: 0.43 ng/mL

## 2023-10-17 MED ORDER — MIDODRINE HCL 5 MG PO TABS: 15.0000 mg | ORAL_TABLET | Freq: Three times a day (TID) | ORAL | Status: AC

## 2023-10-17 MED ORDER — OXYCODONE HCL 5 MG PO TABS: 5.0000 mg | ORAL_TABLET | ORAL | Status: AC | PRN

## 2023-10-17 MED ORDER — NOREPINEPHRINE 4 MG/250ML-% IV SOLN: 0.0000 ug/min | INTRAVENOUS | Status: AC

## 2023-10-17 MED ORDER — DAKINS (1/4 STRENGTH) 0.125 % EX SOLN
Freq: Two times a day (BID) | CUTANEOUS | Status: DC
  Filled 2023-10-17: qty 473

## 2023-10-17 MED ORDER — POTASSIUM PHOSPHATES 15 MMOLE/5ML IV SOLN
15.0000 mmol | Freq: Once | INTRAVENOUS | Status: AC
  Administered 2023-10-17: 15 mmol via INTRAVENOUS
  Filled 2023-10-17: qty 5

## 2023-10-17 MED ORDER — ALBUMIN HUMAN 25 % IV SOLN
25.0000 g | Freq: Four times a day (QID) | INTRAVENOUS | Status: DC
  Administered 2023-10-17 (×2): 25 g via INTRAVENOUS
  Filled 2023-10-17 (×2): qty 100

## 2023-10-17 MED ORDER — GUAIFENESIN ER 600 MG PO TB12
600.0000 mg | ORAL_TABLET | Freq: Two times a day (BID) | ORAL | Status: DC
  Administered 2023-10-17: 600 mg via ORAL
  Filled 2023-10-17: qty 1

## 2023-10-17 MED ORDER — ASCORBIC ACID 500 MG PO TABS: 500.0000 mg | ORAL_TABLET | Freq: Two times a day (BID) | ORAL | Status: AC

## 2023-10-17 MED ORDER — ZINC SULFATE 220 (50 ZN) MG PO CAPS: 220.0000 mg | ORAL_CAPSULE | Freq: Every day | ORAL | Status: AC

## 2023-10-17 MED ORDER — ENOXAPARIN SODIUM 120 MG/0.8ML IJ SOSY: 110.0000 mg | PREFILLED_SYRINGE | Freq: Two times a day (BID) | INTRAMUSCULAR | Status: AC

## 2023-10-17 MED ORDER — DAKINS (1/4 STRENGTH) 0.125 % EX SOLN: Freq: Two times a day (BID) | CUTANEOUS | Status: AC

## 2023-10-17 MED ORDER — MEDIHONEY WOUND/BURN DRESSING EX PSTE: 1.0000 | PASTE | Freq: Two times a day (BID) | CUTANEOUS | Status: AC

## 2023-10-17 MED ORDER — MIDODRINE HCL 5 MG PO TABS
15.0000 mg | ORAL_TABLET | Freq: Three times a day (TID) | ORAL | Status: DC
  Administered 2023-10-17 (×2): 15 mg via ORAL
  Filled 2023-10-17 (×2): qty 3

## 2023-10-17 MED ORDER — ADULT MULTIVITAMIN W/MINERALS CH: 1.0000 | ORAL_TABLET | Freq: Every day | ORAL | Status: AC

## 2023-10-17 NOTE — Progress Notes (Addendum)
 NAME:  Derek [name], MRN:  [id number], DOB:  01-Apr-1946, LOS: 21 ADMISSION DATE:  09/26/2023, CONSULTATION DATE:  2/11 REFERRING MD:  Jearld Fenton , CHIEF COMPLAINT:  Hypotension   History of Present Illness:  78 year old man with multiple myeloma (on chemotherapy), dyslipidemia, and GERD. Brought in by family for increasing lower extremity edema, perineal weeping sores, and worsening generalized weakness for 1-2 weeks. At baseline patient relatively independent. He is experience rapid decline in function recently. Per patient report because he is difficult to move he has been left in his urine and feces. Per wife the patient experiences pain and does not want to be moved. Smoker. In ED, he was found to be hypotensive and given 2.5 L LR, Vanco, Rocephin, Cefepime, Levophed @ 7 mcg/min now, Rx for hyperK, and HTS for hypoNa   Pertinent  Medical History  Multiple myeloma on chemotherapy, dyslipidemia, GERD  Significant Hospital Events: Including procedures, antibiotic start and stop dates in addition to other pertinent events   2/11 Admit, CCS consult 2/12 LAPAROSCOPIC LOOP COLOSTOMY, EXCISIONAL SHARP DEBRIDEMENT OF 24 CM TALL X 16 CM WIDE X 4 CM DEEP SACRAL WOUND USING A SCALPEL AND ELECTROCAUTERY; Left on vent for Afib/shock state postop 2/13 Extubated  2/15 On amiodarone, on Levophed 2/17 To OR for debridement, fourniers and nec fasc seen 2/19 Return to OR for debridement. Hgb down to 6.8, 2u PRBC 2/21 Remains on low dose pressors.  2/26 PCCM reconsulted for low-dose vasopressor requirements 2/28 Remains on low dose pressors, poor oral intake. Ongoing hydrotherapy . Seen sitting up in bed with breakfast try prepped and in front of him but not consuming anything  3/1 -he is sitting in the chair with his boots on.  He is watching TV.  He is alert and oriented on room air.  But he still on low-dose Levophed.  Resolved on 09/27/2023 was 31.8.  He is on midodrine -. START HYDROCORT (CORTISOL  16)  Interim History / Subjective:  Patient awake, alert, oriented. He has no complaints this morning. On Levophed at . He does have a weak, productive cough. Possible LTAC today. Increasing midodrine and albumin to help wean off levophed.   Objective   Blood pressure (!) 149/51, pulse 77, temperature 97.8 F (36.6 C), temperature source Oral, resp. rate 17, height 5\' 6"  (1.676 m), weight 108 kg, SpO2 100%.        Intake/Output Summary (Last 24 hours) at 10/17/2023 8657 Last data filed at 10/16/2023 8469 Gross per 24 hour  Intake 256.84 ml  Output 2375 ml  Net -2118.16 ml   Filed Weights   10/13/23 0500 10/15/23 0500 10/16/23 0500  Weight: 111 kg 108 kg 108 kg    Examination: General: elderly male, obese, laying in bed, no acute distress  HENT: [place]/at, anicteric sclera, perrla, nasal cannula Lungs: rhonchi R>L,  2L Cardiovascular: s1s2 no murmur, rub, gallop Abdomen: rounded, soft, ntnd Extremities: 1-2+ pitting edema, BLE Neuro: alert, oriented, non focal exam GU: no foley  Resolved Hospital Problem list   hypoK, hypomag, hypophos. Acute delirium.   Assessment & Plan:  Septic shock secondary to large necrotic sacral ulcer s/p debridement  Diverting ostomy 09/28/2023 -Cultures with rare E.coli, rare Enterococcus faecalis, rare Pseudomonas aeruginosa, few bacteriodes thetaiotaomicro. Per CCS, no role for further OR debridement, agree with ongoing hydrotherapy and sharp debridement at bedside - con't on levophed. MAP goal now >55, SBP > 105  - increase midodrine to 15mg  TID  - add albumin 25g TID  - con't  hydrocortisone 100 q8h  - con't to optimize nutrition  - trend lactic, wbc, fever curve  - con't hydrotherapy, wound therapy  - wound per CCS  - pending LTAC - to [facility] today possibly 3/3   Hypotension  Ongoing hypotension after septic shock. Has completed course of zosyn on 3/1. No fever or leukocytosis. Procal this AM 0.43 and has productive, weak cough. No  significant ostomy output. If he was adrenally insufficient, hydrocortisone probably would have improved hypotension at this point. - pCXR  - will place a-line for accuracy  - f/u echo  - albumin and increasing midodrine as above   Cough  Has productive weak cough on exam. Procal this AM 0.43. With ongoing hypotension, 20d LOS, concern for HAP. - pCXR  - consider vanc   Atrial fibrillation - Rate controlled  Left femoral DVT - con't telemonitoring  - lovenox  - correct elytes   ABLA - presumed acute after wound debridement - stable  - trend and transfuse for <7   Wheelchair-bound at baseline Failure to thrive History of multiple myeloma on chemotherapy - PT/OT/SLP as able    Anasarca with volume overload  Hypoalbuminemia  - albumin 25g TID  - nutrition  - mobilize as able   Best Practice (right click and "Reselect all SmartList Selections" daily)   Diet/type: dysphagia diet (see orders) DVT prophylaxis: LMWH Pressure ulcer(s): see wound assessment per CCS and wound GI prophylaxis: N/A Lines: yes and it is still needed PICC Foley:  N/A Code Status:  full code Last date of multidisciplinary goals of care discussion [ongoing per primary]  Labs   CBC: Recent Labs  Lab 10/11/23 0448 10/12/23 0506 10/13/23 0615 10/14/23 0510 10/15/23 0515  WBC 5.4 5.5 7.5 7.8 6.1  HGB 7.2* 7.9* 8.7* 7.6* 7.3*  HCT 23.4* 24.8* 27.2* 24.5* 24.4*  MCV 101.3* 101.2* 99.6 102.5* 104.3*  PLT 336 352 381 308 299    Basic Metabolic Panel: Recent Labs  Lab 10/11/23 0448 10/12/23 0506 10/13/23 0615 10/14/23 0510 10/15/23 0515 10/17/23 0642  NA 141 140 138 138 142  --   K 3.8 3.7 3.8 3.8 3.7  --   CL 105 104 103 104 106  --   CO2 27 26 27 28 30   --   GLUCOSE 90 95 107* 156* 106*  --   BUN 22 20 17 14 12   --   CREATININE 0.59* 0.48* 0.58* 0.59* 0.55*  --   CALCIUM 8.6* 8.5* 8.4* 8.3* 9.1  --   MG  --  1.8 1.8  --   --  2.0  PHOS  --  2.4* 3.0  --   --  2.3*    GFR: Estimated Creatinine Clearance: 90.6 mL/min (A) (by C-G formula based on SCr of 0.55 mg/dL (L)). Recent Labs  Lab 10/12/23 0506 10/13/23 0615 10/14/23 0510 10/15/23 0515  WBC 5.5 7.5 7.8 6.1    Liver Function Tests: Recent Labs  Lab 10/14/23 0510  AST 14*  ALT 20  ALKPHOS 115  BILITOT 0.6  PROT 5.0*  ALBUMIN 1.7*   No results for input(s): "LIPASE", "AMYLASE" in the last 168 hours. No results for input(s): "AMMONIA" in the last 168 hours.  ABG    Component Value Date/Time   PHART 7.231 (L) 09/28/2023 1428   PCO2ART 45.5 09/28/2023 1428   PO2ART 308 (H) 09/28/2023 1428   HCO3 19.1 (L) 09/28/2023 1428   TCO2 20 (L) 09/28/2023 1428   ACIDBASEDEF 8.0 (H) 09/28/2023 1428  O2SAT 100 09/28/2023 1428     Coagulation Profile: No results for input(s): "INR", "PROTIME" in the last 168 hours.  Cardiac Enzymes: No results for input(s): "CKTOTAL", "CKMB", "CKMBINDEX", "TROPONINI" in the last 168 hours.  HbA1C: Hemoglobin A1C  Date/Time Value Ref Range Status  10/05/2013 11:45 AM 4.9 %  Final    Comment:    normal range 4.2-6.3 %   HB A1C (BAYER DCA - WAIVED)  Date/Time Value Ref Range Status  11/17/2020 11:49 AM 4.9 <7.0 % Final    Comment:                                          Diabetic Adult            <7.0                                       Healthy Adult        4.3 - 5.7                                                           (DCCT/NGSP) American Diabetes Association's Summary of Glycemic Recommendations for Adults with Diabetes: Hemoglobin A1c <7.0%. More stringent glycemic goals (A1c <6.0%) may further reduce complications at the cost of increased risk of hypoglycemia.    Hgb A1c MFr Bld  Date/Time Value Ref Range Status  07/29/2023 05:03 AM 4.8 4.8 - 5.6 % Final    Comment:    (NOTE) Pre diabetes:          5.7%-6.4%  Diabetes:              >6.4%  Glycemic control for   <7.0% adults with diabetes     CBG: Recent Labs  Lab  10/15/23 2131 10/16/23 0736 10/16/23 1131 10/16/23 1539 10/16/23 2151  GLUCAP 141* 122* 150* 161* 129*    Review of Systems:   As above  Past Medical History:  He,  has a past medical history of Arthritis, Cancer (HCC), GERD (gastroesophageal reflux disease), Hypercholesterolemia, Obesity, and Urethritis.   Surgical History:   Past Surgical History:  Procedure Laterality Date   ANTERIOR CERVICAL DECOMP/DISCECTOMY FUSION N/A 05/06/2021   Procedure: Anterior Cervical Discectomy Fusion Cervical Three-Four, Cervical Four-Five;  Surgeon: Bedelia Person, MD;  Location: [facility];  Service: Neurosurgery;  Laterality: N/A;   COLONOSCOPY     DRESSING CHANGE UNDER ANESTHESIA N/A 10/05/2023   Procedure: DRESSING CHANGE UNDER ANESTHESIA;  Surgeon: Karie Soda, MD;  Location: [facility] ORS;  Service: General;  Laterality: N/A;   IR BONE MARROW BIOPSY & ASPIRATION  04/27/2023   IRRIGATION AND DEBRIDEMENT BUTTOCKS Bilateral 10/03/2023   Procedure: EXPLORATION OF SACRUM AND BACK WITH THIGH IRRIGATION AND DEBRIDEMENT;  Surgeon: Karie Soda, MD;  Location: [facility] ORS;  Service: General;  Laterality: Bilateral;   LAPAROSCOPIC LOOP COLOSTOMY N/A 09/28/2023   Procedure: LAPAROSCOPIC LOOP COLOSTOMY DEBRIDEMENT OF SACRAL WOUND;  Surgeon: Quentin Ore, MD;  Location: [facility] ORS;  Service: General;  Laterality: N/A;   LAPAROTOMY N/A 10/05/2023   Procedure: Johnsie Cancel OF BACK, HIPS, THIGHS;  Surgeon: Karie Soda, MD;  Location:  [facility] ORS;  Service: General;  Laterality: N/A;   ORIF FEMUR FRACTURE Left 12/06/2022   Procedure: OPEN REDUCTION INTERNAL FIXATION (ORIF) DISTAL FEMUR FRACTURE;  Surgeon: Roby Lofts, MD;  Location: [facility] OR;  Service: Orthopedics;  Laterality: Left;   TOTAL KNEE ARTHROPLASTY Left 07/22/2020   Procedure: LEFT TOTAL KNEE ARTHROPLASTY;  Surgeon: Kathryne Hitch, MD;  Location: [facility] OR;  Service: Orthopedics;  Laterality: Left;   TRANSFORAMINAL LUMBAR INTERBODY FUSION (TLIF) WITH  PEDICLE SCREW FIXATION 1 LEVEL N/A 10/20/2021   Procedure: Transforaminal Lumbar Interbody Fusion Lumbar Four-Five;  Surgeon: Bedelia Person, MD;  Location: [facility] OR;  Service: Neurosurgery;  Laterality: N/A;  Transforaminal Lumbar Interbody Fusion Lumbar Four-Five   WOUND DEBRIDEMENT Left 10/05/2023   Procedure: REDEBRIDEMENT WOUND;  Surgeon: Karie Soda, MD;  Location: [facility] ORS;  Service: General;  Laterality: Left;     Social History:   reports that he has never smoked. He has never used smokeless tobacco. He reports that he does not drink alcohol and does not use drugs.   Family History:  His family history includes Cancer in his mother; Diabetes in his father.   Allergies No Known Allergies   Home Medications  Prior to Admission medications   Medication Sig Start Date End Date Taking? Authorizing Provider  acetaminophen (TYLENOL) 325 MG tablet Take 650 mg by mouth every 6 (six) hours as needed for moderate pain (pain score 4-6).   Yes [provider]  acyclovir (ZOVIRAX) 800 MG tablet Take 400 mg by mouth 2 (two) times daily.   Yes [provider]  aspirin EC 81 MG tablet Take 1 tablet (81 mg total) by mouth daily. Swallow whole. 07/30/23  Yes Glade Lloyd, MD  cholecalciferol (VITAMIN D) 1000 UNITS tablet Take 1,000 Units by mouth daily.   Yes [provider]  lenalidomide (REVLIMID) 25 MG capsule Take 25 mg by mouth daily. [employer] Auth # N/A     Date Obtained 09-19-2023   Yes [provider]  Nutritional Supplements (PROSTATE PO) Take 1 tablet by mouth 2 (two) times daily.   Yes [provider]  trolamine salicylate (ASPERCREME) 10 % cream Apply 1 application topically as needed for muscle pain.   Yes [provider]  atorvastatin (LIPITOR) 40 MG tablet Take 1 tablet (40 mg total) by mouth at bedtime. Patient not taking: Reported on 09/26/2023 07/30/23   Glade Lloyd, MD  clopidogrel (PLAVIX) 75 MG tablet Take 1 tablet (75 mg  total) by mouth daily. Patient not taking: Reported on 09/26/2023 07/30/23   Glade Lloyd, MD     Critical care time: 31    Lenard Galloway [facility] 10/17/23 10:50 AM  Please see [email or web address] for pager details.  From 7A-7P if no response, please call [phone] After hours, please call ELink [phone]

## 2023-10-17 NOTE — Consult Note (Signed)
 WOC ostomy follow-up: Surgical team following for assessment and plan of care for sacral and buttock wounds.   No family present during pouch change; Pt is in ICU and watched the procedure but did not participate. Stoma is red and viable, 2 1/4 inches, slightly above skin level. Applied  2 piece pouching system. 2 extra supplies left at the bedside. Use Supplies: wafer [place] # 2 and pouch [place] # [id number] Emptied 50cc liquid brown stool.  Enrolled patient in [place] Secure Start Discharge program: Yes; previously Thank-you,  Cammie Mcgee MSN, RN, CWOCN, [place], CNS [phone]

## 2023-10-17 NOTE — Evaluation (Signed)
 Clinical/Bedside Swallow Evaluation Patient Details  Name: Dois Juarbe MRN: [id number] Date of Birth: 10-25-1945  Today's Date: 10/17/2023 Time: SLP Start Time (ACUTE ONLY): 0915 SLP Stop Time (ACUTE ONLY): 0935 SLP Time Calculation (min) (ACUTE ONLY): 20 min  Past Medical History:  Past Medical History:  Diagnosis Date   Arthritis    left knee   Cancer (HCC)    GERD (gastroesophageal reflux disease)    Hypercholesterolemia    Obesity    Urethritis    when he was in [employer]   Past Surgical History:  Past Surgical History:  Procedure Laterality Date   ANTERIOR CERVICAL DECOMP/DISCECTOMY FUSION N/A 05/06/2021   Procedure: Anterior Cervical Discectomy Fusion Cervical Three-Four, Cervical Four-Five;  Surgeon: Bedelia Person, MD;  Location: [facility] OR;  Service: Neurosurgery;  Laterality: N/A;   COLONOSCOPY     DRESSING CHANGE UNDER ANESTHESIA N/A 10/05/2023   Procedure: DRESSING CHANGE UNDER ANESTHESIA;  Surgeon: Karie Soda, MD;  Location: [facility];  Service: General;  Laterality: N/A;   IR BONE MARROW BIOPSY & ASPIRATION  04/27/2023   IRRIGATION AND DEBRIDEMENT BUTTOCKS Bilateral 10/03/2023   Procedure: EXPLORATION OF SACRUM AND BACK WITH THIGH IRRIGATION AND DEBRIDEMENT;  Surgeon: Karie Soda, MD;  Location: [facility];  Service: General;  Laterality: Bilateral;   LAPAROSCOPIC LOOP COLOSTOMY N/A 09/28/2023   Procedure: LAPAROSCOPIC LOOP COLOSTOMY DEBRIDEMENT OF SACRAL WOUND;  Surgeon: Quentin Ore, MD;  Location: [facility];  Service: General;  Laterality: N/A;   LAPAROTOMY N/A 10/05/2023   Procedure: Johnsie Cancel OF BACK, HIPS, THIGHS;  Surgeon: Karie Soda, MD;  Location: [facility];  Service: General;  Laterality: N/A;   ORIF FEMUR FRACTURE Left 12/06/2022   Procedure: OPEN REDUCTION INTERNAL FIXATION (ORIF) DISTAL FEMUR FRACTURE;  Surgeon: Roby Lofts, MD;  Location: [facility] OR;  Service: Orthopedics;  Laterality: Left;   TOTAL KNEE ARTHROPLASTY Left 07/22/2020   Procedure: LEFT  TOTAL KNEE ARTHROPLASTY;  Surgeon: Kathryne Hitch, MD;  Location: [facility] OR;  Service: Orthopedics;  Laterality: Left;   TRANSFORAMINAL LUMBAR INTERBODY FUSION (TLIF) WITH PEDICLE SCREW FIXATION 1 LEVEL N/A 10/20/2021   Procedure: Transforaminal Lumbar Interbody Fusion Lumbar Four-Five;  Surgeon: Bedelia Person, MD;  Location: [facility] OR;  Service: Neurosurgery;  Laterality: N/A;  Transforaminal Lumbar Interbody Fusion Lumbar Four-Five   WOUND DEBRIDEMENT Left 10/05/2023   Procedure: REDEBRIDEMENT WOUND;  Surgeon: Karie Soda, MD;  Location: [facility];  Service: General;  Laterality: Left;   HPI:  Patietnt is a 78 y.o. male with PMH: multiple myeloma on chemotherapy, HLD, GERD. He presented to the hospital on 09/26/23 for increased LE edema, weeping sores and worsening generalized weakness. In ER, vitals were stable. On examination he was found to have extensive pressure ulcers on B/L upper posterior thighs and scrotum. At baseline he is relatively independent but has experienced a rapid decline recently. Wife reported he is difficult to move and patient does not want to be moved because of pain, leading to him being left in his urine and feces. he was intubated on 2/12 for for surgical debridement and laparoscopic loop colostomy and left on ventilator due to a-fib/shock state post-op. He was extubated on 2/13. He has had poor oral intake. SLP swallow evaluation ordered on 3/3 due to patient with cough and concern for aspiration.    Assessment / Plan / Recommendation  Clinical Impression  Patient is presenting with a questionable pharyngeal phase dysphagia and a likely esophageal phase dysphagia based on his history and presentation during this bedside  swallow evaluation. He exhibited congested sounding cough prior to, during and after PO intake. Cough was typically effective to mobilize secretions and he did have three instances of productive cough during which he expectorated light yellow phlegm. With  sips of thin liquids (water) via straw sips and sips of Ensure supplement shake, patient did not exhibit immediate coughing or throat clearing. He did exhibit delayed, mostly productive cough but unable to determine if cause was PO's versus secretions. No change in voice or vitals throughout session. SLP recommending to continue on current PO diet and will plan to follow up at least one more time to ensure diet toleration. SLP Visit Diagnosis: Dysphagia, unspecified (R13.10)    Aspiration Risk  Mild aspiration risk    Diet Recommendation Regular;Thin liquid    Liquid Administration via: Cup;Straw Medication Administration: Whole meds with liquid Supervision: Staff to assist with self feeding Compensations: Slow rate;Small sips/bites Postural Changes: Seated upright at 90 degrees;Remain upright for at least 30 minutes after po intake    Other  Recommendations Oral Care Recommendations: Oral care BID;Staff/trained caregiver to provide oral care    Recommendations for follow up therapy are one component of a multi-disciplinary discharge planning process, led by the attending physician.  Recommendations may be updated based on patient status, additional functional criteria and insurance authorization.  Follow up Recommendations Other (comment) (TBD)      Assistance Recommended at Discharge    Functional Status Assessment Patient has had a recent decline in their functional status and demonstrates the ability to make significant improvements in function in a reasonable and predictable amount of time.  Frequency and Duration min 1 x/week  1 week       Prognosis Prognosis for improved oropharyngeal function: Good      Swallow Study   General Date of Onset: 10/17/23 HPI: Patietnt is a 78 y.o. male with PMH: multiple myeloma on chemotherapy, HLD, GERD. He presented to the hospital on 09/26/23 for increased LE edema, weeping sores and worsening generalized weakness. In ER, vitals were stable.  On examination he was found to have extensive pressure ulcers on B/L upper posterior thighs and scrotum. At baseline he is relatively independent but has experienced a rapid decline recently. Wife reported he is difficult to move and patient does not want to be moved because of pain, leading to him being left in his urine and feces. he was intubated on 2/12 for for surgical debridement and laparoscopic loop colostomy and left on ventilator due to a-fib/shock state post-op. He was extubated on 2/13. He has had poor oral intake. SLP swallow evaluation ordered on 3/3 due to patient with cough and concern for aspiration. Type of Study: Bedside Swallow Evaluation Previous Swallow Assessment: none found Diet Prior to this Study: Regular;Thin liquids (Level 0) Temperature Spikes Noted: No Respiratory Status: Nasal cannula History of Recent Intubation: Yes Total duration of intubation (days): 2 days Date extubated: 10/27/23 Behavior/Cognition: Alert;Cooperative;Pleasant mood Oral Cavity Assessment: Within Functional Limits Oral Care Completed by SLP: No Self-Feeding Abilities: Able to feed self Patient Positioning: Upright in bed Baseline Vocal Quality: Normal Volitional Cough: Congested Volitional Swallow: Able to elicit    Oral/Motor/Sensory Function Overall Oral Motor/Sensory Function: Within functional limits   Ice Chips     Thin Liquid Thin Liquid: Impaired Presentation: Straw Pharyngeal  Phase Impairments: Throat Clearing - Delayed    Nectar Thick     Honey Thick     Puree Puree: Not tested   Solid     Solid:  Not tested      Angela Nevin, MA, CCC-SLP Speech Therapy

## 2023-10-17 NOTE — Progress Notes (Signed)
 PHARMACY - ANTICOAGULATION CONSULT NOTE  Pharmacy Consult for Lovenox Indication: DVT  No Known Allergies  Patient Measurements: Height: 5\' 6"  (167.6 cm) Weight: 108 kg (238 lb 1.6 oz) IBW/kg (Calculated) : 63.8  Vital Signs: Temp: 96.9 F (36.1 C) (03/03 0800) Temp Source: Axillary (03/03 0800) BP: 110/47 (03/03 1021) Pulse Rate: 86 (03/03 1021)  Labs: Recent Labs    10/15/23 0515  HGB 7.3*  HCT 24.4*  PLT 299  CREATININE 0.55*    Estimated Creatinine Clearance: 90.6 mL/min (A) (by C-G formula based on SCr of 0.55 mg/dL (L)).   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 male with limited mobility (wheelchair- bound), multiple myeloma, and sacral wound who presented to the ED on 09/26/2023 with c/o generalized weakness, worsening of LE edema and weeping leg wounds.  LE doppler on 09/27/23 showed acute deep vein thrombosis involving the left  femoral vein. Anticoag. held off at the time due to low blood count.    Pharmacy was consulted to initiate heparin drip on 2/15. Heparin level was persistently undetectable. No further surgical intervention planned. D/w CCM, transitioned anticoagulation to enoxaparin on 2/21.  Significant events:  - 2/11: 1 unit PRBC - 2/12: 2 unit PRBC - 2/12: I&D of sacral wound with loop colostomy.  - 2/17: I&D of sacral wound.  - 2/19: OR for exploration, redebridement. Transfused - 2/21: Confirmed with CCS, no further surgical intervention planned   Today, 10/17/2023  SCr remains < 1 CBC (last on 3/1): Hgb low/stable at 7.3, Plt WNL  Goal of Therapy:  Anti-Xa level 0.6-1 units/ml 4hrs after LMWH dose given Monitor platelets by anticoagulation protocol: Yes   Plan:  - Continue Lovenox to 110mg  SQ q 12 hrs - CBC at least  q72h while on LMWH - Follow up plans to transition to [facility]   Lynann Beaver PharmD, BCPS [facility] main pharmacy [phone] 10/17/2023 11:07 AM

## 2023-10-17 NOTE — Progress Notes (Signed)
 Physical Therapy Treatment Patient Details Name: Derek [name] MRN: [id number] DOB: 1946/03/16 Today's Date: 10/17/2023   History of Present Illness Pt is a 78 year old male admitted on 09/27/23 with sepsis.  Pt s/p s/p laparoscopic loop colostomy, excisional sharp debridement of sacral wound by Dr. Dossie Der on 09/28/23.  Pt was left on vent for Afib/shock state postop.  Pt extubated 09/29/23.  Pt s/p extensive excisional debridement of back hip thighs and sacrum by Dr Michaell Cowing on 10/03/2023 and  then redebridement and repacking by Dr. Michaell Cowing on 10/05/2023.  PMH Includes but is not limited to ca, GERD, fall, HLD, L TKA, TLIF L4-l5, ACDF and periprosthetic ORIF of LLE on 12/05/2022. Recent diagnosis of chronic cervical and lower brainstem myelomalacia, on chemotherapy.    PT Comments  Patient in recliner, worked on [employer] exercises for BUE's and B LE's. Patient demonstrates impaired sensation of  lower left leg, absent active dorsi /plantar flexion.  Pt, assisted to sit upright and pull forward x 10 reps. Patient responds that performing the exercises made him feel better.     If plan is discharge home, recommend the following: Two people to help with walking and/or transfers;Two people to help with bathing/dressing/bathroom   Can travel by private vehicle        Equipment Recommendations  None recommended by PT    Recommendations for Other Services       Precautions / Restrictions Precautions Precautions: Fall Recall of Precautions/Restrictions: Impaired Precaution/Restrictions Comments: Marland Kitchen Multiple Lines; Ostomy; remains on pressors, right lower leg with numbnes and lacks d=orsi/plantar flex Restrictions Weight Bearing Restrictions Per Provider Order: No     Mobility  Bed Mobility               General bed mobility comments: in recliner by RN via [facility]    Transfers                        Ambulation/Gait                   Stairs              Wheelchair Mobility     Tilt Bed    Modified Rankin (Stroke Patients Only)       Balance                                            Communication Communication Communication: No apparent difficulties  Cognition Arousal: Alert Behavior During Therapy: WFL for tasks assessed/performed   PT - Cognitive impairments: Orientation   Orientation impairments: Time                       Following commands impaired: Follows one step commands with increased time, Only follows one step commands consistently    Cueing Cueing Techniques: Verbal cues, Tactile cues  Exercises      General Comments        Pertinent Vitals/Pain Pain Assessment Pain Assessment: No/denies pain    Home Living                          Prior Function            PT Goals (current goals can now be found in the care plan section) Progress towards PT goals: Progressing toward goals  Frequency    Min 1X/week      PT Plan      Co-evaluation              AM-PAC PT "6 Clicks" Mobility   Outcome Measure  Help needed turning from your back to your side while in a flat bed without using bedrails?: Total Help needed moving from lying on your back to sitting on the side of a flat bed without using bedrails?: Total Help needed moving to and from a bed to a chair (including a wheelchair)?: Total Help needed standing up from a chair using your arms (e.g., wheelchair or bedside chair)?: Total Help needed to walk in hospital room?: Total Help needed climbing 3-5 steps with a railing? : Total 6 Click Score: 6    End of Session Equipment Utilized During Treatment: Oxygen Activity Tolerance: Patient tolerated treatment well Patient left: in chair;with call bell/phone within reach Nurse Communication: Mobility status;Need for lift equipment PT Visit Diagnosis: Muscle weakness (generalized) (M62.81);Other abnormalities of gait and mobility (R26.89)      Time: 5784-6962 PT Time Calculation (min) (ACUTE ONLY): 27 min  Charges:    $Therapeutic Exercise: 23-37 mins PT General Charges $$ ACUTE PT VISIT: 1 Visit                     Blanchard Kelch PT Acute Rehabilitation Services Office [phone] Weekend pager-[phone]    Rada Hay 10/17/2023, 5:12 PM
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
## 2023-10-17 NOTE — Care Management Important Message (Signed)
 Important Message  Patient Details IM Letter given Name: Derek [name] MRN: [id number] Date of Birth: [birth year]   Important Message Given:  Yes - Medicare IM     Caren Macadam 10/17/2023, 3:07 PM

## 2023-10-17 NOTE — Progress Notes (Signed)
 Progress Note   Patient: Derek [name] ZOX:[id number] DOB: Mar 28, 1946 DOA: 09/26/2023     21 DOS: the patient was seen and examined on 10/17/2023   Brief hospital course: 78 year old man complex hospitalization admitted 2/10.  PMH includes multiple myeloma on chemotherapy.  Presented with generalized weakness, weeping sores, increasing lower extremity edema.  Admitted for hyperkalemia, AKI.  Rapidly decompensated and was transferred to ICU and care taken over by critical care for septic shock with multiorgan failure in wheelchair-bound patient with severe leg wounds, third spacing, and multiple myeloma on chemo.  He was quickly seen by general surgery for necrotic sacral, perineal, scrotal and posterior thigh pressure wounds.  Underwent diverting loop colostomy, and debridement of wounds.  Developed necrotizing fasciitis and underwent multiple debridements.  At this point remains on vasopressors for hypotension.  Has been referred to [facility].  Consultants PCCM 2/11 Palliative medicine 2/11 General Surgery 2/11 Infectious disease 2/25  Procedures/Events 2/11 Admit, CCS consult 2/12 LAPAROSCOPIC LOOP COLOSTOMY, EXCISIONAL SHARP DEBRIDEMENT OF 24 CM TALL X 16 CM WIDE X 4 CM DEEP SACRAL WOUND USING A SCALPEL AND ELECTROCAUTERY; Left on vent for Afib/shock state postop.  Perioperative atrial fibrillation.   2/13 Extubated  2/15 atrial fibrillation recurred.  On amiodarone, on Levophed 2/17 To OR for debridement.  Status post excisional debridement of back, sacrum, hips, thighs.  Postoperative diagnosis necrotizing fasciitis and Fournier's gangrene left anterior/posterior thigh, right posterior thigh, sacrum, back, left gluteus 2/19 Return to OR for debridement of above.  Hgb down to 6.8, 2u PRBC 2/21 Remains on low dose pressors 2/24 transferred to [facility] 2/26 PCCM reconsulted for low-dose vasopressor requirements  Assessment and Plan: Septic shock secondary to large necrotic sacral ulcer s/p  debridement Necrotizing fasciitis Sacral osteomyelitis Status post laparoscopic loop colostomy Status post excisional debridement back, sacrum, hips, thighs for necrotizing fasciitis Remains on pressors, managed as per critical care.   No signs of worsening infection.   Continue wound care as directed by general surgery  Not a flap candidate per plastic surgery. Finished Zosyn 10/15/2023.   Reengage ID if patient undergoes a flap or is considered a flap candidate in the future   Acute delirium Resolved   ABLA Multiple myeloma currently undergoing chemotherapy  chronic anemia  Thrombocytopenia -- resolved Hb as low as 6.8, received multiple blood transfusions.  Baseline hemoglobin ~ 9 gm.   Hemoglobin stable   Perioperative atrial fibrillation  Remains in sinus rhythm.  On anticoagulation for femoral DVT.   Left femoral DVT Continue therapeutic Lovenox per pharmacy.  Can change to DOAC if no procedures planned.  PCCM considering A-line.   Anasarca with volume overload Hypoalbuminemia Echo with normal EF 07/2023.    Hypophosphatemia Replete   AKI -- resolved Baseline around 1.0.  Peak 4.43. Currently creatinine is better than baseline.   Severe hyponatremia -- resolved   Failure to thrive Debility/deconditioning, wheelchair bound at baseline Continue supportive care, palliative care input appreciated at this time opting for aggressive measures.  Continue PT OT   Morbid Obesity Patient's Body mass index is 40.21 kg/m.     Unstageable Pressure Injury to sacrum/buttocks/B ischium  Full thickness wounds posterior lower legs likely r/t venous insufficiency  Deep Tissue Pressure Injury L heel  Pressure Injury POA: Yes   Pelvic retroperitoneal adenopathy, stable from 09/27/2023 but enlarged from 04/14/2023. Findings may be reactive in etiology. Recommend attention on follow-up or consider CT abdomen pelvis with contrast in 3 months in further initial evaluation (from  10/03/2023)  Continues on vasopressors.  Complex wound care.  Referral to [facility] for consideration.  Stable for transfer to LTAC.  Nurse reports he had some shortness of breath earlier with concern for possible aspiration.  Will check a chest x-ray as ordered by PCCM.  I think presently he is still stable for LTAC.     Subjective:  Overall doing all right today when seen earlier  Called later by RN, concern for possible aspiration, reported some shortness of breath  Physical Exam: Vitals:   10/17/23 0515 10/17/23 0530 10/17/23 0545 10/17/23 0600  BP: (!) 125/40 (!) 131/47 (!) 154/53 (!) 149/51  Pulse: 67 65 81 77  Resp: 10 11 14 17   Temp:      TempSrc:      SpO2: 100% 100% 100% 100%  Weight:      Height:       Physical Exam Vitals reviewed.  Constitutional:      General: He is not in acute distress.    Appearance: He is not ill-appearing or toxic-appearing.  Cardiovascular:     Rate and Rhythm: Normal rate and regular rhythm.     Heart sounds: No murmur heard. Pulmonary:     Effort: Pulmonary effort is normal. No respiratory distress.     Breath sounds: No wheezing, rhonchi or rales.  Musculoskeletal:     Right lower leg: No edema.     Left lower leg: No edema.  Neurological:     Mental Status: He is alert.  Psychiatric:        Behavior: Behavior normal.     Data Reviewed: Urine output 1900 Bowels moving CBG stable Phosphorus slightly low 2.3 Magnesium within normal limits Chest x-ray pending  Family Communication:   Disposition: Status is: Inpatient Remains inpatient appropriate because: On vasopressor     Time spent: 20 minutes  Author: Brendia Sacks, MD 10/17/2023 7:48 AM  For on call review [email or web address].

## 2023-10-17 NOTE — Progress Notes (Signed)
 This nurse was informed that the patient cannot travel with [facility] to [facility] (must be [facility] transportation). Unable to get in contact with case management. Our unit clerk called [employer]; report given to [facility] dispatch (patient to go to room 416 at [facility]) and [facility] transport should be here within the next 1-2 hours per dispatch.

## 2023-10-17 NOTE — TOC Transition Note (Signed)
 Transition of Care [facility]) - Discharge Note   Patient Details  Name: Derek [name] MRN: [id number] Date of Birth: 1946/01/27  Transition of Care [facility]) CM/SW Contact:  Otelia Santee, LCSW Phone Number: 10/17/2023, 2:45 PM   Clinical Narrative:    Insurance approved for pt to transfer to [facility] for LTACH. Pt will be going to room 416. RN to call report to [phone]. MD to call report to Dr Lum Keas at [phone]. DC packet placed at RN station. Spoke with pt's wife via t/c and confirmed discharge plans. [facility] called at 2:40pm for transportation.    Final next level of care: Long Term Acute Care (LTAC) Barriers to Discharge: No Barriers Identified   Patient Goals and CMS Choice Patient states their goals for this hospitalization and ongoing recovery are:: For this pt to get medically better CMS Medicare.gov Compare Post Acute Care list provided to:: Patient Represenative (must comment) Choice offered to / list presented to : Spouse [place] ownership interest in [facility].provided to:: Spouse    Discharge Placement              Patient chooses bed at: Other - please specify in the comment section below: ([facility]) Patient to be transferred to facility by: [facility] Name of family member notified: Wife Patient and family notified of of transfer: 10/17/23  Discharge Plan and Services Additional resources added to the After Visit Summary for   In-house Referral: Clinical Social Work Discharge Planning Services: NA Post Acute Care Choice:  (Unsure at this time)          DME Arranged: N/A DME Agency: NA                  Social Drivers of Health (SDOH) Interventions SDOH Screenings   Food Insecurity: No Food Insecurity (09/27/2023)  Housing: Low Risk  (09/27/2023)  Transportation Needs: No Transportation Needs (09/27/2023)  Utilities: Not At Risk (09/27/2023)  Alcohol Screen: Low Risk  (12/24/2022)  Depression (PHQ2-9): Medium Risk (04/05/2023)   Financial Resource Strain: Low Risk  (12/24/2022)  Physical Activity: Inactive (04/05/2023)  Social Connections: Socially Isolated (09/27/2023)  Stress: Stress Concern Present (04/05/2023)  Tobacco Use: Low Risk  (09/28/2023)     Readmission Risk Interventions     No data to display

## 2023-10-17 NOTE — Progress Notes (Addendum)
 12 Days Post-Op   Subjective/Chief Complaint: No acute events. Dressing changed with RN staff.  Objective: Vital signs in last 24 hours: Temp:  [96.9 F (36.1 C)-97.9 F (36.6 C)] 96.9 F (36.1 C) (03/03 0800) Pulse Rate:  [49-97] 86 (03/03 1021) Resp:  [9-23] 10 (03/03 1019) BP: (77-157)/(25-76) 110/47 (03/03 1021) SpO2:  [89 %-100 %] 99 % (03/03 1021) Last BM Date : 10/16/23  Intake/Output from previous day: 03/02 0701 - 03/03 0700 In: 540.6 [P.O.:240; I.V.:300.6] Out: 2375 [Urine:1900; Stool:475] Intake/Output this shift: Total I/O In: 497.3 [P.O.:240; I.V.:257.3] Out: 800 [Urine:800]  Resting comfortably Unlabored respirations Abdomen is soft, nontender, nondistended.  Ostomy is viable with gas and nonbloody stool in the bag.  Wounds as below:    SACRUM/BUTTOCKS - picture below in left lateral decubitus - improving with hydroPT.  Now has green drainage on dressing consistent with pseudomonas.       Lab Results:  Recent Labs    10/15/23 0515  WBC 6.1  HGB 7.3*  HCT 24.4*  PLT 299   BMET Recent Labs    10/15/23 0515  NA 142  K 3.7  CL 106  CO2 30  GLUCOSE 106*  BUN 12  CREATININE 0.55*  CALCIUM 9.1   PT/INR No results for input(s): "LABPROT", "INR" in the last 72 hours. ABG No results for input(s): "PHART", "HCO3" in the last 72 hours.  Invalid input(s): "PCO2", "PO2"  Studies/Results: No results found.   Anti-infectives: Anti-infectives (From admission, onward)    Start     Dose/Rate Route Frequency Ordered Stop   10/04/23 0900  linezolid (ZYVOX) IVPB 600 mg  Status:  Discontinued        600 mg 300 mL/hr over 60 Minutes Intravenous Every 12 hours 10/04/23 0809 10/11/23 1433   10/04/23 0830  clindamycin (CLEOCIN) IVPB 900 mg  Status:  Discontinued        900 mg 100 mL/hr over 30 Minutes Intravenous Every 6 hours 10/04/23 0730 10/04/23 0809   09/27/23 1100  piperacillin-tazobactam (ZOSYN) IVPB 3.375 g        3.375 g 12.5 mL/hr over  240 Minutes Intravenous Every 8 hours 09/27/23 1052 10/15/23 2359   09/27/23 0600  ceFEPIme (MAXIPIME) 2 g in sodium chloride 0.9 % 100 mL IVPB  Status:  Discontinued        2 g 200 mL/hr over 30 Minutes Intravenous Every 24 hours 09/27/23 0021 09/27/23 1025   09/27/23 0030  vancomycin (VANCOREADY) IVPB 2000 mg/400 mL        2,000 mg 200 mL/hr over 120 Minutes Intravenous  Once 09/27/23 0021 09/27/23 0333   09/27/23 0021  vancomycin variable dose per unstable renal function (pharmacist dosing)  Status:  Discontinued         Does not apply See admin instructions 09/27/23 0021 09/27/23 0416   09/26/23 2200  cefTRIAXone (ROCEPHIN) 2 g in sodium chloride 0.9 % 100 mL IVPB        2 g 200 mL/hr over 30 Minutes Intravenous  Once 09/26/23 2148 09/26/23 2323       Assessment/Plan: POD 19 s/p laparoscopic loop colostomy, excisional sharp debridement of sacral wound by Dr. Dossie Der on 09/28/23 POD 14/9 s/p excisional debridement of back, sacrum, hips and thighs for necrotizing fasciitis Dr. Michaell Cowing - Afebrile, white count 6.1 yesterday, stable low dose levo requirement + midodrine - WOCN consult for new ostomy. Ostomy bridge removed POD5 - Cont BID WTD dressings to wounds.  Add dakins x 3 days. Continue  hydroPT. Continue specialty mattress, frequent turning, maximize nutrition as able.  No emergent surgical needs. We will plan to check wound thurs or Friday with hydroPT pending their schedule - Cont abx. Per ID for osteomyelitis for Cx with E. Coli, Enterococcus faecalis, Pseudomonas  FEN - Soft diet VTE - SCD's, therapeutic lovenox for acute DVT ID - Linezolid and Zosyn. Defer selection to primary  Foley - okay to remove from our standpoint   Anemia - s/p 1U 2/11, 2U 2/12, 2U 2/19. Hgb stable  Aspiration PNA L femoral DVT - newly diagnosed this admission Multiple myeloma on lenalidomide  Wheelchair bound HLD GERD   LOS: 21 days    Adam Phenix 10/17/2023

## 2023-10-18 ENCOUNTER — Encounter: Payer: Self-pay | Admitting: Neurology

## 2023-10-18 ENCOUNTER — Ambulatory Visit: Payer: Medicare HMO | Admitting: Neurology

## 2023-10-18 DIAGNOSIS — I4891 Unspecified atrial fibrillation: Secondary | ICD-10-CM

## 2023-10-18 DIAGNOSIS — R6521 Severe sepsis with septic shock: Secondary | ICD-10-CM

## 2023-10-18 DIAGNOSIS — M726 Necrotizing fasciitis: Secondary | ICD-10-CM

## 2023-10-18 DIAGNOSIS — I82492 Acute embolism and thrombosis of other specified deep vein of left lower extremity: Secondary | ICD-10-CM

## 2023-10-19 DIAGNOSIS — I82492 Acute embolism and thrombosis of other specified deep vein of left lower extremity: Secondary | ICD-10-CM

## 2023-10-19 DIAGNOSIS — R6521 Severe sepsis with septic shock: Secondary | ICD-10-CM

## 2023-10-19 DIAGNOSIS — I4891 Unspecified atrial fibrillation: Secondary | ICD-10-CM

## 2023-10-19 DIAGNOSIS — M726 Necrotizing fasciitis: Secondary | ICD-10-CM

## 2023-10-24 DIAGNOSIS — M726 Necrotizing fasciitis: Secondary | ICD-10-CM

## 2023-10-24 DIAGNOSIS — I4891 Unspecified atrial fibrillation: Secondary | ICD-10-CM

## 2023-10-24 DIAGNOSIS — R6521 Severe sepsis with septic shock: Secondary | ICD-10-CM

## 2023-10-24 DIAGNOSIS — I82492 Acute embolism and thrombosis of other specified deep vein of left lower extremity: Secondary | ICD-10-CM

## 2023-12-27 ENCOUNTER — Ambulatory Visit (INDEPENDENT_AMBULATORY_CARE_PROVIDER_SITE_OTHER): Payer: Medicare HMO

## 2023-12-27 VITALS — BP 116/67 | HR 59 | Wt 235.0 lb

## 2023-12-27 DIAGNOSIS — Z0000 Encounter for general adult medical examination without abnormal findings: Secondary | ICD-10-CM

## 2023-12-27 NOTE — Progress Notes (Signed)
 Subjective:   Derek [name] is a 78 y.o. who presents for a Medicare Wellness preventive visit.  As a reminder, Annual Wellness Visits don't include a physical exam, and some assessments may be limited, especially if this visit is performed virtually. We may recommend an in-person visit if needed.  Visit Complete: Virtual I connected with  Derek [name] on 12/27/23 by a audio enabled telemedicine application and verified that I am speaking with the correct person using two identifiers.  Patient Location: Home  Provider Location: Home Office  I discussed the limitations of evaluation and management by telemedicine. The patient expressed understanding and agreed to proceed.  Vital Signs: Because this visit was a virtual/telehealth visit, some criteria may be missing or patient reported. Any vitals not documented were not able to be obtained and vitals that have been documented are patient reported.  VideoDeclined- This patient declined Librarian, academic. Therefore the visit was completed with audio only.  Persons Participating in Visit: Patient.  AWV Questionnaire: No: Patient Medicare AWV questionnaire was not completed prior to this visit.  Cardiac Risk Factors include: advanced age (>75men, >4 women);male gender;Other (see comment), Risk factor comments: Atrial fibrillation (HCC)     Objective:     Today's Vitals   12/27/23 0843 12/27/23 1030  BP:  116/67  Pulse:  (!) 59  Weight:  235 lb (106.6 kg)  PainSc: 5     Body mass index is 37.93 kg/m.     12/27/2023    8:59 AM 09/28/2023    9:17 AM 09/26/2023    2:24 PM 07/29/2023    3:27 PM 07/29/2023    4:59 AM 04/27/2023    8:55 AM 12/24/2022    8:41 AM  Advanced Directives  Does Patient Have a Medical Advance Directive? No  No No No No Yes  Type of Tax inspector;Living will  Copy of Healthcare Power of Attorney in Chart?       No - copy requested   Would patient like information on creating a medical advance directive?  No - Patient declined  No - Patient declined  No - Patient declined     Current Medications (verified) Outpatient Encounter Medications as of 12/27/2023  Medication Sig   acetaminophen  (TYLENOL ) 325 MG tablet Take 650 mg by mouth every 6 (six) hours as needed for moderate pain (pain score 4-6).   acyclovir (ZOVIRAX) 800 MG tablet Take 400 mg by mouth 2 (two) times daily.   ascorbic acid  (VITAMIN C ) 500 MG tablet Take 1 tablet (500 mg total) by mouth 2 (two) times daily.   cholecalciferol  (VITAMIN D ) 1000 UNITS tablet Take 1,000 Units by mouth daily.   enoxaparin  (LOVENOX ) 120 MG/0.8ML injection Inject 0.74 mLs (110 mg total) into the skin every 12 (twelve) hours.   [Paused] lenalidomide (REVLIMID) 25 MG capsule Take 25 mg by mouth daily. [employer] Auth # N/A     Date Obtained 09-19-2023   leptospermum manuka honey (MEDIHONEY) PSTE paste Apply 1 Application topically 2 (two) times daily.   midodrine  (PROAMATINE ) 5 MG tablet Take 3 tablets (15 mg total) by mouth 3 (three) times daily with meals.   Multiple Vitamin (MULTIVITAMIN WITH MINERALS) TABS tablet Take 1 tablet by mouth daily.   norepinephrine  (LEVOPHED ) 4-5 MG/250ML-% SOLN Inject 0-40 mcg/min into the vein continuous.   Nutritional Supplements (PROSTATE PO) Take 1 tablet by mouth 2 (two) times daily.   oxyCODONE  (OXY IR/ROXICODONE ) 5  MG immediate release tablet Take 1-2 tablets (5-10 mg total) by mouth every 4 (four) hours as needed for moderate pain (pain score 4-6), severe pain (pain score 7-10) or breakthrough pain (5mg  for moderate pain, 10mg  for severe pain).   sodium hypochlorite (DAKIN'S 1/4 STRENGTH) 0.125 % SOLN Irrigate with as directed 2 (two) times daily.   zinc  sulfate, 50mg  elemental zinc , 220 (50 Zn) MG capsule Take 1 capsule (220 mg total) by mouth daily.   No facility-administered encounter medications on file as of 12/27/2023.    Allergies  (verified) Patient has no known allergies.   History: Past Medical History:  Diagnosis Date   Arthritis    left knee   Cancer (HCC)    GERD (gastroesophageal reflux disease)    Hypercholesterolemia    Obesity    Urethritis    when he was in [employer]   Past Surgical History:  Procedure Laterality Date   ANTERIOR CERVICAL DECOMP/DISCECTOMY FUSION N/A 05/06/2021   Procedure: Anterior Cervical Discectomy Fusion Cervical Three-Four, Cervical Four-Five;  Surgeon: Van Gelinas, MD;  Location: [facility];  Service: Neurosurgery;  Laterality: N/A;   COLONOSCOPY     DRESSING CHANGE UNDER ANESTHESIA N/A 10/05/2023   Procedure: DRESSING CHANGE UNDER ANESTHESIA;  Surgeon: Candyce Champagne, MD;  Location: [facility] ORS;  Service: General;  Laterality: N/A;   IR BONE MARROW BIOPSY & ASPIRATION  04/27/2023   IRRIGATION AND DEBRIDEMENT BUTTOCKS Bilateral 10/03/2023   Procedure: EXPLORATION OF SACRUM AND BACK WITH THIGH IRRIGATION AND DEBRIDEMENT;  Surgeon: Candyce Champagne, MD;  Location: [facility] ORS;  Service: General;  Laterality: Bilateral;   LAPAROSCOPIC LOOP COLOSTOMY N/A 09/28/2023   Procedure: LAPAROSCOPIC LOOP COLOSTOMY DEBRIDEMENT OF SACRAL WOUND;  Surgeon: Junie Olds, MD;  Location: [facility] ORS;  Service: General;  Laterality: N/A;   LAPAROTOMY N/A 10/05/2023   Procedure: Wheeler Hammonds OF BACK, HIPS, THIGHS;  Surgeon: Candyce Champagne, MD;  Location: [facility] ORS;  Service: General;  Laterality: N/A;   ORIF FEMUR FRACTURE Left 12/06/2022   Procedure: OPEN REDUCTION INTERNAL FIXATION (ORIF) DISTAL FEMUR FRACTURE;  Surgeon: Laneta Pintos, MD;  Location: [facility] OR;  Service: Orthopedics;  Laterality: Left;   TOTAL KNEE ARTHROPLASTY Left 07/22/2020   Procedure: LEFT TOTAL KNEE ARTHROPLASTY;  Surgeon: Arnie Lao, MD;  Location: [facility] OR;  Service: Orthopedics;  Laterality: Left;   TRANSFORAMINAL LUMBAR INTERBODY FUSION (TLIF) WITH PEDICLE SCREW FIXATION 1 LEVEL N/A 10/20/2021   Procedure: Transforaminal Lumbar  Interbody Fusion Lumbar Four-Five;  Surgeon: Van Gelinas, MD;  Location: [facility];  Service: Neurosurgery;  Laterality: N/A;  Transforaminal Lumbar Interbody Fusion Lumbar Four-Five   WOUND DEBRIDEMENT Left 10/05/2023   Procedure: REDEBRIDEMENT WOUND;  Surgeon: Candyce Champagne, MD;  Location: [facility] ORS;  Service: General;  Laterality: Left;   Family History  Problem Relation Age of Onset   Cancer Mother        breast   Diabetes Father    Social History   Socioeconomic History   Marital status: Married    Spouse name: Not on file   Number of children: 2   Years of education: graduated high school   Highest education level: High school graduate  Occupational History   Occupation: retired  Tobacco Use   Smoking status: Never   Smokeless tobacco: Never  Vaping Use   Vaping status: Never Used  Substance and Sexual Activity   Alcohol  use: No    Alcohol /week: 0.0 standard drinks of alcohol    Drug use: No   Sexual activity:  Yes    Birth control/protection: Condom  Other Topics Concern   Not on file  Social History Narrative   Lives 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   Right handed   Social Drivers of Health   Financial Resource Strain: Low Risk  (12/27/2023)   Overall Financial Resource Strain (CARDIA)    Difficulty of Paying Living Expenses: Not hard at all  Food Insecurity: No Food Insecurity (12/27/2023)   Hunger Vital Sign    Worried About Running Out of Food in the Last Year: Never true    Ran Out of Food in the Last Year: Never true  Transportation Needs: No Transportation Needs (12/27/2023)   PRAPARE - Administrator, Civil Service (Medical): No    Lack of Transportation (Non-Medical): No  Physical Activity: Sufficiently Active (12/27/2023)   Exercise Vital Sign    Days of Exercise per Week: 7 days    Minutes of Exercise per Session: 30 min  Stress: No Stress Concern Present (12/27/2023)   [employer] of  Occupational Health - Occupational Stress Questionnaire    Feeling of Stress : Only a little  Social Connections: Socially Isolated (09/27/2023)   Social Connection and Isolation Panel [NHANES]    Frequency of Communication with Friends and Family: More than three times a week    Frequency of Social Gatherings with Friends and Family: More than three times a week    Attends Religious Services: Never    Database administrator or Organizations: No    Attends Engineer, structural: Never    Marital Status: Separated    Tobacco Counseling Counseling given: Yes    Clinical Intake:  Pre-visit preparation completed: Yes  Pain : 0-10 (per pt stated that he has a butt wound that is started to heal however is still giving him some pain/sore. per pt would like to get some ABX/cream for his butt (wound). will let pcp know) Pain Score: 5  Pain Type: Other (Comment) (per pt a couple months ago)     BMI - recorded: 37.93 Nutritional Status: BMI > 30  Obese Nutritional Risks: None Diabetes: No  Lab Results  Component Value Date   HGBA1C 4.8 07/29/2023   HGBA1C 4.9 11/17/2020   HGBA1C 4.9 % 10/05/2013     How often do you need to have someone help you when you read instructions, pamphlets, or other written materials from your doctor or pharmacy?: 1 - Never  Interpreter Needed?: No  Comments: during awv this morning,per pt stated that he has a butt wound that is started to heal however is still giving him some pain/sore. per pt would like to get some ABX/cream for his butt (wound). will let pcp know Information entered by :: Alia t/cma   Activities of Daily Living     12/27/2023    8:55 AM 09/28/2023    9:00 AM  In your present state of health, do you have any difficulty performing the following activities:  Hearing? 1 0  Comment a little/will go to the [place] and try get some hearing aid   Vision? 1 0  Comment needs glasses, will go to the [place] for glasses   Difficulty  concentrating or making decisions? 0 1  Walking or climbing stairs? 1   Comment at the moment is using a wheel chair   Dressing or bathing? 1   Comment home aide   Doing errands, shopping? 1   Comment  pt's wife/[facility] Facilities manager and eating ? Y   Comment home aide   Using the Toilet? N   In the past six months, have you accidently leaked urine? Y   Do you have problems with loss of bowel control? Y   Comment use bag   Managing your Medications? Y   Managing your Finances? N   Housekeeping or managing your Housekeeping? Y   Comment home aide     Patient Care Team: Clinic, [facility] as PCP - General Van Gelinas, MD as Consulting Physician (Neurosurgery) Gaile Jourdain, Larene Pleasant, LCSW as [facility] Care Management (Licensed Clinical Social Worker) Dettinger, Lucio Sabin, MD as Consulting Physician (Family Medicine)  Indicate any recent Medical Services you may have received from other than [facility] providers in the past year (date may be approximate).     Assessment:    This is a routine wellness examination for [facility]  Hearing/Vision screen Hearing Screening - Comments:: a little/will go to the [place] and try get some hearing aid Vision Screening - Comments:: Per pt needs glasses, will go to the [place] once he is able to get on his feet   Goals Addressed   None    Depression Screen     12/27/2023    9:07 AM 04/05/2023    4:00 PM 02/07/2023   10:17 AM 01/24/2023   10:34 AM 01/05/2023    5:30 PM 12/29/2022    9:58 AM 12/24/2022    8:41 AM  PHQ 2/9 Scores  PHQ - 2 Score 2 2 2 1 2 2  0  PHQ- 9 Score 16 7 7 3 7 7      Fall Risk     12/27/2023    9:00 AM 01/24/2023   10:34 AM 12/24/2022    8:39 AM 02/22/2022    2:11 PM 12/22/2021    8:56 AM  Fall Risk   Falls in the past year? 0 1 1 0 1  Number falls in past yr: 0 1 1  1   Injury with Fall? 0 1 1  0  Risk for fall due to : No Fall Risks Impaired balance/gait History of fall(s);Impaired  balance/gait;Orthopedic patient  History of fall(s);Impaired balance/gait;Orthopedic patient;Medication side effect  Follow up Falls prevention discussed;Falls evaluation completed Falls evaluation completed Education provided;Falls prevention discussed  Education provided;Falls prevention discussed    MEDICARE RISK AT HOME:  Medicare Risk at Home Any stairs in or around the home?: No If so, are there any without handrails?: No Home free of loose throw rugs in walkways, pet beds, electrical cords, etc?: Yes Adequate lighting in your home to reduce risk of falls?: Yes Life alert?: Yes Use of a cane, walker or w/c?: Yes (wheel chair) Grab bars in the bathroom?: No Shower chair or bench in shower?: Yes Elevated toilet seat or a handicapped toilet?: No  TIMED UP AND GO:  Was the test performed?  no  Cognitive Function: 6CIT completed        12/27/2023    9:11 AM 12/24/2022    8:42 AM 12/22/2021    9:03 AM 12/19/2019   10:17 AM 12/12/2018   11:02 AM  6CIT Screen  What Year? 0 points 0 points 0 points 0 points 0 points  What month? 0 points 0 points 0 points 0 points 0 points  What time? 0 points 0 points 0 points 3 points 0 points  Count back from 20 0 points 0 points 0 points 0 points 0 points  Months in reverse 2 points 0 points 2 points 4 points 4 points  Repeat phrase 0 points 0 points 6 points 2 points 0 points  Total Score 2 points 0 points 8 points 9 points 4 points    Immunizations Immunization History  Administered Date(s) Administered   Fluad Quad(high Dose 65+) 05/25/2019   Influenza,inj,Quad PF,6+ Mos 06/25/2014, 06/15/2016   Influenza-Unspecified 06/16/2013, 05/25/2020   Moderna Sars-Covid-2 Vaccination 09/20/2019, 10/18/2019, 04/25/2020   Pneumococcal Conjugate-13 05/25/2019   Pneumococcal Polysaccharide-23 11/17/2020   Tdap 09/29/2011    Screening Tests Health Maintenance  Topic Date Due   Zoster Vaccines- Shingrix (1 of 2) Never done   DTaP/Tdap/Td (2 - Td  or Tdap) 09/28/2021   COVID-19 Vaccine (4 - 2024-25 season) 01/11/2025 (Originally 04/17/2023)   INFLUENZA VACCINE  03/16/2024   Pneumonia Vaccine 44+ Years old  Completed   Hepatitis C Screening  Completed   HPV VACCINES  Aged Out   Meningococcal B Vaccine  Aged Out   Colonoscopy  Discontinued    Health Maintenance  Health Maintenance Due  Topic Date Due   Zoster Vaccines- Shingrix (1 of 2) Never done   DTaP/Tdap/Td (2 - Td or Tdap) 09/28/2021   Health Maintenance Items Addressed: See Nurse Notes  Additional Screening:  Vision Screening: Recommended annual ophthalmology exams for early detection of glaucoma and other disorders of the eye.  Dental Screening: Recommended annual dental exams for proper oral hygiene  Community Resource Referral / Chronic Care Management: CRR required this visit?  Yes   CCM required this visit?  No   Plan:    I have personally reviewed and noted the following in the patient's chart:   Medical and social history Use of alcohol , tobacco or illicit drugs  Current medications and supplements including opioid prescriptions. Patient is currently taking opioid prescriptions. Information provided to patient regarding non-opioid alternatives. Patient advised to discuss non-opioid treatment plan with their provider. Functional ability and status Nutritional status Physical activity Advanced directives List of other physicians Hospitalizations, surgeries, and ER visits in previous 12 months Vitals Screenings to include cognitive, depression, and falls Referrals and appointments  In addition, I have reviewed and discussed with patient certain preventive protocols, quality metrics, and best practice recommendations. A written personalized care plan for preventive services as well as general preventive health recommendations were provided to patient.   Michaelle Adolphus, CMA   12/27/2023   After Visit Summary: (Declined) Due to this being a telephonic  visit, with patients personalized plan was offered to patient but patient Declined AVS at this time   Notes: pt is aware and encourage to discuss getting the following done at your next visit with your provider: Shingles/tetanus vaccine done. Also pls routing msg

## 2023-12-27 NOTE — Patient Instructions (Addendum)
 [name] , Thank you for taking time out of your busy schedule to complete your Annual Wellness Visit with me. I enjoyed our conversation and look forward to speaking with you again next year. I, as well as your care team,  appreciate your ongoing commitment to your health goals. Please review the following plan we discussed and let me know if I can assist you in the future.  Follow up Visits: Next Medicare AWV with our clinical staff: Thursday, 12/27/24 at 8:40a.m.   Next Office Visit with your provider: n/a  Clinician Recommendations:  Aim for 30 minutes of exercise or brisk walking, 6-8 glasses of water , and 5 servings of fruits and vegetables each day. Please remember to discuss getting the following done at your next visit with your provider: Shingles/tetanus vaccine done. If you have any questions please contact our office at [phone].       This is a list of the screening recommended for you and due dates:  Health Maintenance  Topic Date Due   Zoster (Shingles) Vaccine (1 of 2) Never done   DTaP/Tdap/Td vaccine (2 - Td or Tdap) 09/28/2021   COVID-19 Vaccine (4 - 2024-25 season) 01/11/2025*   Flu Shot  03/16/2024   Pneumonia Vaccine  Completed   Hepatitis C Screening  Completed   HPV Vaccine  Aged Out   Meningitis B Vaccine  Aged Out   Colon Cancer Screening  Discontinued  *Topic was postponed. The date shown is not the original due date.    Advanced directives: (Declined) Advance directive discussed with you today. Even though you declined this today, please call our office should you change your mind, and we can give you the proper paperwork for you to fill out. Advance Care Planning is important because it:  [x]  Makes sure you receive the medical care that is consistent with your values, goals, and preferences  [x]  It provides guidance to your family and loved ones and reduces their decisional burden about whether or not they are making the right decisions based on your  wishes.  Follow the link provided in your after visit summary or read over the paperwork we have mailed to you to help you started getting your Advance Directives in place. If you need assistance in completing these, please reach out to us  so that we can help you!  See attachments for Preventive Care and Fall Prevention Tips.

## 2023-12-28 ENCOUNTER — Other Ambulatory Visit: Payer: Self-pay

## 2023-12-28 ENCOUNTER — Telehealth: Payer: Self-pay

## 2023-12-28 MED ORDER — MUPIROCIN 2 % EX OINT: 1.0000 | TOPICAL_OINTMENT | Freq: Two times a day (BID) | CUTANEOUS | 1 refills | Status: AC

## 2023-12-28 NOTE — Telephone Encounter (Signed)
 Pt made aware and understood. Rx sent to [facility] in [facility]

## 2023-12-28 NOTE — Telephone Encounter (Signed)
-----   Message from Lucio Sabin Dettinger sent at 12/28/2023  7:47 AM EDT ----- Please send in mupirocin ointment/cream for him use 1 g twice daily, give 60 g tube with 1 refill.  If wound starts to look worse then please have him make an appointment ----- Message ----- From: Michaelle Adolphus, CMA Sent: 12/27/2023   2:35 PM EDT To: Lucio Sabin Dettinger, MD  During awv today: per pt stated that he has a butt wound that is started to heal however is still giving him some pain/sore. per pt would like to get some ABX/cream for his butt (wound). will let pcp know  PHQ=16

## 2023-12-30 ENCOUNTER — Telehealth: Payer: Self-pay

## 2023-12-30 NOTE — Progress Notes (Signed)
 Complex Care Management Note Care Guide Note  12/30/2023 Name: Derek [name] MRN: [id number] DOB: [birth year]   Complex Care Management Outreach Attempts: An unsuccessful telephone outreach was attempted today to offer the patient information about available complex care management services.  Follow Up Plan:  Additional outreach attempts will be made to offer the patient complex care management information and services.   Encounter Outcome:  No Answer  Lenton Rail , RMA     [place]  [facility], [facility] Guide  Direct Dial: [phone]  Website: [email or web address]

## 2024-01-03 NOTE — Progress Notes (Signed)
 Complex Care Management Note  Care Guide Note 01/03/2024 Name: Derek [name] MRN: [id number] DOB: 11/12/1945  Derek [name] is a 78 y.o. year old male who sees [facility] for [facility]. I reached out to [name] by phone today to offer complex care management services.  Mr. Candelas was given information about Complex Care Management services today including:   The Complex Care Management services include support from the care team which includes your Nurse Care Manager, Clinical Social Worker, or Pharmacist.  The Complex Care Management team is here to help remove barriers to the health concerns and goals most important to you. Complex Care Management services are voluntary, and the patient may decline or stop services at any time by request to their care team member.   Complex Care Management Consent Status: Patient agreed to services and verbal consent obtained.   Follow up plan:  Telephone appointment with complex care management team member scheduled for:  01/18/2024  Encounter Outcome:  Patient Scheduled  Lenton Rail , RMA     [place]  [facility], [facility] Guide  Direct Dial: [phone]  Website: [email or web address]

## 2024-01-18 ENCOUNTER — Telehealth: Admitting: Licensed Clinical Social Worker

## 2024-01-19 ENCOUNTER — Other Ambulatory Visit: Payer: Self-pay | Admitting: Licensed Clinical Social Worker

## 2024-01-19 NOTE — Patient Outreach (Signed)
 Complex Care Management   Visit Note  01/19/2024  Name:  Derek [name] MRN: [id number] DOB: 12-09-1945  Situation: Referral received for Complex Care Management related to SDOH Barriers:  Financial Resource Strain I obtained verbal consent from Patient.  Visit completed with [name]  on the phone. Pt reports his monthly income is approx 2000.00 a month, spouse income is currently unknown at this time. Pt encouraged to call mortgage company and request hardship assistance.   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    Assessment: Patient Reported Symptoms:  Cognitive Cognitive Status: Able to follow simple commands, Alert and oriented to person, place, and time Cognitive/Intellectual Conditions Management [RPT]: None reported or documented in medical history or problem list      Neurological Neurological Review of Symptoms: No symptoms reported    HEENT HEENT Symptoms Reported: No symptoms reported      Cardiovascular Cardiovascular Symptoms Reported: Other: Other Cardiovascular Symptoms: A Fib    Respiratory Respiratory Symptoms Reported: No symptoms reported    Endocrine Patient reports the following symptoms related to hypoglycemia or hyperglycemia : No symptoms reported Is patient diabetic?: No    Gastrointestinal Gastrointestinal Symptoms Reported: Other Other Gastrointestinal Symptoms: Gerd      Genitourinary Genitourinary Symptoms Reported: No symptoms reported    Integumentary Integumentary Symptoms Reported: Wound Skin Conditions: Pressure injury Skin Comment: prior history reported  Musculoskeletal Musculoskelatal Symptoms Reviewed: No symptoms reported Musculoskeletal Conditions: Back pain Falls in the past year?: No Number of falls in past year: 1 or less Was there an injury with  Fall?: No Fall Risk Category Calculator: 0 Patient Fall Risk Level: Low Fall Risk    Psychosocial       Quality of Family Relationships: involved Do you feel physically threatened by others?: No      01/19/2024    4:07 PM  Depression screen PHQ 2/9  Decreased Interest 1  Down, Depressed, Hopeless 1  PHQ - 2 Score 2  Tired, decreased energy 0  Change in appetite 1  Feeling bad or failure about yourself  0  Trouble concentrating 2  Moving slowly or fidgety/restless 0  Suicidal thoughts 0    There were no vitals filed for this visit.  Medications Reviewed Today     Reviewed by Fletcher Humble, LCSW (Social Worker) on 01/19/24 at 1027  Med List Status: <None>   Medication Order Taking? Sig Documenting Provider Last Dose Status Informant  acetaminophen  (TYLENOL ) 325 MG tablet [id number] No Take 650 mg by mouth every 6 (six) hours as needed for moderate pain (pain score 4-6). [provider] Taking Active Spouse/Significant Other, Pharmacy Records  acyclovir (ZOVIRAX) 800 MG tablet [id number] No Take 400 mg by mouth 2 (two) times daily. [provider] Taking Active Spouse/Significant Other, Pharmacy Records  ascorbic acid  (VITAMIN C ) 500 MG tablet [id number] No Take 1 tablet (500 mg total) by mouth 2 (two) times daily. Lonita Roach, MD Taking Active   cholecalciferol  (VITAMIN D ) 1000 UNITS tablet [id number] No Take 1,000 Units by mouth daily. [provider] Taking Active Pharmacy Records, Spouse/Significant Other  enoxaparin  (LOVENOX ) 120 MG/0.8ML injection [id number] No Inject 0.74 mLs (110 mg total) into the skin every 12 (twelve) hours. Lonita Roach, MD Taking Active  lenalidomide (REVLIMID) 25 MG capsule [id number] No Take 25 mg by mouth daily. [employer] Auth # N/A     Date Obtained 09-19-2023 [provider] Taking Active Spouse/Significant Other, Pharmacy Records  leptospermum manuka honey (MEDIHONEY) PSTE paste [id number] No Apply 1  Application topically 2 (two) times daily. Lonita Roach, MD Taking Active   midodrine  (PROAMATINE ) 5 MG tablet [id number] No Take 3 tablets (15 mg total) by mouth 3 (three) times daily with meals. Lonita Roach, MD Taking Active   Multiple Vitamin (MULTIVITAMIN WITH MINERALS) TABS tablet [id number] No Take 1 tablet by mouth daily. Lonita Roach, MD Taking Active   mupirocin  ointment (BACTROBAN ) 2 % [id number]  Apply 1 Application topically 2 (two) times daily. Dettinger, Lucio Sabin, MD  Active   norepinephrine  (LEVOPHED ) 4-5 MG/250ML-% SOLN [id number] No Inject 0-40 mcg/min into the vein continuous. Lonita Roach, MD Taking Active   Nutritional Supplements (PROSTATE PO) [id number] No Take 1 tablet by mouth 2 (two) times daily. [provider] Taking Active Pharmacy Records, Spouse/Significant Other  oxyCODONE  (OXY IR/ROXICODONE ) 5 MG immediate release tablet [id number] No Take 1-2 tablets (5-10 mg total) by mouth every 4 (four) hours as needed for moderate pain (pain score 4-6), severe pain (pain score 7-10) or breakthrough pain (5mg  for moderate pain, 10mg  for severe pain). Lonita Roach, MD Taking Active   sodium hypochlorite (DAKIN'S 1/4 STRENGTH) 0.125 % SOLN [id number] No Irrigate with as directed 2 (two) times daily. Lonita Roach, MD Taking Active   zinc  sulfate, 50mg  elemental zinc , 220 (50 Zn) MG capsule [id number] No Take 1 capsule (220 mg total) by mouth daily. Lonita Roach, MD Taking Active             Recommendation:   Financial assistance application and ensure coupons send to patient. Pt encouraged to contact [employer] at [phone] assistance with lawn care and home repairs.   Follow Up Plan:   Telephone follow up appointment date/time:  02/02/2024  Fletcher Humble MSW, LCSW Licensed Clinical Social Worker  [facility], Population Health Direct Dial: [phone]  Fax: [phone]

## 2024-01-19 NOTE — Patient Instructions (Signed)
 Visit Information  Thank you for taking time to visit with me today. Please don't hesitate to contact me if I can be of assistance to you before our next scheduled appointment.  Your next care management appointment is by telephone on 02/02/2024 at 10am  Telephone follow up appointment date/time:  02/02/2024  Please call the care guide team at [phone] if you need to cancel, schedule, or reschedule an appointment.   Please call 911 if you are experiencing a Mental Health or Behavioral Health Crisis or need someone to talk to.  Fletcher Humble MSW, LCSW Licensed Clinical Social Worker  [facility], Population Health Direct Dial: [phone]  Fax: [phone]

## 2024-02-02 ENCOUNTER — Other Ambulatory Visit: Payer: Self-pay | Admitting: Licensed Clinical Social Worker

## 2024-02-02 NOTE — Patient Outreach (Signed)
 Complex Care Management   Visit Note  02/02/2024  Name:  Derek [name] MRN: [id number] DOB: October 29, 1945  Situation: Referral received for Complex Care Management related to SDOH Barriers:  Financial Resource Strain I obtained verbal consent from Patient.  Visit completed with C Briddell  on the phone. PCP placed referral for spouse home health assistance. 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    Assessment: Patient Reported Symptoms:  Cognitive        Neurological      HEENT        Cardiovascular      Respiratory      Endocrine      Gastrointestinal        Genitourinary      Integumentary      Musculoskeletal          Psychosocial       Quality of Family Relationships: involved, supportive, helpful Do you feel physically threatened by others?: No      01/19/2024    4:07 PM  Depression screen PHQ 2/9  Decreased Interest 1  Down, Depressed, Hopeless 1  PHQ - 2 Score 2  Tired, decreased energy 0  Change in appetite 1  Feeling bad or failure about yourself  0  Trouble concentrating 2  Moving slowly or fidgety/restless 0  Suicidal thoughts 0    There were no vitals filed for this visit.  Medications Reviewed Today     Reviewed by Fletcher Humble, LCSW (Social Worker) on 02/02/24 at 1633  Med List Status: <None>   Medication Order Taking? Sig Documenting Provider Last Dose Status Informant  acetaminophen  (TYLENOL ) 325 MG tablet [id number] No Take 650 mg by mouth every 6 (six) hours as needed for moderate pain (pain score 4-6). [provider] Taking Active Spouse/Significant Other, Pharmacy Records  acyclovir (ZOVIRAX) 800 MG tablet [id number] No Take 400 mg by mouth 2 (two) times daily. [provider] Taking Active  Spouse/Significant Other, Pharmacy Records  ascorbic acid  (VITAMIN C ) 500 MG tablet [id number] No Take 1 tablet (500 mg total) by mouth 2 (two) times daily. Lonita Roach, MD Taking Active   cholecalciferol  (VITAMIN D ) 1000 UNITS tablet [id number] No Take 1,000 Units by mouth daily. [provider] Taking Active Pharmacy Records, Spouse/Significant Other  enoxaparin  (LOVENOX ) 120 MG/0.8ML injection [id number] No Inject 0.74 mLs (110 mg total) into the skin every 12 (twelve) hours. Lonita Roach, MD Taking Active   lenalidomide (REVLIMID) 25 MG capsule [id number] No Take 25 mg by mouth daily. [employer] Auth # N/A     Date Obtained 09-19-2023 [provider] Taking Active Spouse/Significant Other, Pharmacy Records  leptospermum manuka honey (MEDIHONEY) PSTE paste [id number] No Apply 1 Application topically 2 (two) times daily. Lonita Roach, MD Taking Active   midodrine  (PROAMATINE ) 5 MG tablet [id number] No Take 3 tablets (15 mg total) by mouth 3 (three) times daily with meals. Lonita Roach, MD Taking Active   Multiple Vitamin (MULTIVITAMIN WITH MINERALS) TABS tablet [id number] No Take 1 tablet by mouth daily. Lonita Roach, MD Taking Active   mupirocin  ointment (BACTROBAN ) 2 % [id number]  Apply 1 Application topically 2 (two) times daily. Dettinger, Lucio Sabin, MD  Active  norepinephrine  (LEVOPHED ) 4-5 MG/250ML-% SOLN [id number] No Inject 0-40 mcg/min into the vein continuous. Lonita Roach, MD Taking Active   Nutritional Supplements (PROSTATE PO) [id number] No Take 1 tablet by mouth 2 (two) times daily. [provider] Taking Active Pharmacy Records, Spouse/Significant Other  oxyCODONE  (OXY IR/ROXICODONE ) 5 MG immediate release tablet [id number] No Take 1-2 tablets (5-10 mg total) by mouth every 4 (four) hours as needed for moderate pain (pain score 4-6), severe pain (pain score 7-10) or breakthrough pain (5mg  for moderate pain, 10mg  for severe pain). Lonita Roach, MD Taking Active   sodium hypochlorite (DAKIN'S 1/4 STRENGTH) 0.125 % SOLN [id number] No Irrigate with as directed 2 (two) times daily. Lonita Roach, MD Taking Active   zinc  sulfate, 50mg  elemental zinc , 220 (50 Zn) MG capsule [id number] No Take 1 capsule (220 mg total) by mouth daily. Lonita Roach, MD Taking Active             Recommendation:   Continue Current Plan of Care  Follow Up Plan:   Patient has met all care management goals. Care Management case will be closed. Patient has been provided contact information should new needs arise.   Fletcher Humble MSW, LCSW Licensed Clinical Social Worker  [facility], Population Health Direct Dial: [phone]  Fax: [phone]

## 2024-02-02 NOTE — Patient Instructions (Signed)

## 2024-03-16 ENCOUNTER — Other Ambulatory Visit: Payer: Self-pay | Admitting: *Deleted

## 2024-06-07 ENCOUNTER — Other Ambulatory Visit (HOSPITAL_COMMUNITY): Payer: Self-pay | Admitting: Neurosurgery

## 2024-06-07 DIAGNOSIS — M4712 Other spondylosis with myelopathy, cervical region: Secondary | ICD-10-CM

## 2024-06-07 DIAGNOSIS — M5416 Radiculopathy, lumbar region: Secondary | ICD-10-CM

## 2024-06-15 ENCOUNTER — Ambulatory Visit (HOSPITAL_COMMUNITY)

## 2024-06-19 ENCOUNTER — Ambulatory Visit (HOSPITAL_BASED_OUTPATIENT_CLINIC_OR_DEPARTMENT_OTHER)
Admission: RE | Admit: 2024-06-19 | Discharge: 2024-06-19 | Disposition: A | Discharge status: Home / Self Care | Source: Ambulatory Visit | Attending: Neurosurgery | Admitting: Neurosurgery

## 2024-06-19 ENCOUNTER — Ambulatory Visit (HOSPITAL_COMMUNITY)
Admission: RE | Admit: 2024-06-19 | Discharge: 2024-06-19 | Disposition: A | Discharge status: Home / Self Care | Source: Ambulatory Visit | Attending: Neurosurgery | Admitting: Neurosurgery

## 2024-06-19 DIAGNOSIS — M4326 Fusion of spine, lumbar region: Secondary | ICD-10-CM

## 2024-06-19 DIAGNOSIS — G9589 Other specified diseases of spinal cord: Secondary | ICD-10-CM | POA: Diagnosis not present

## 2024-06-19 DIAGNOSIS — M4712 Other spondylosis with myelopathy, cervical region: Secondary | ICD-10-CM | POA: Insufficient documentation

## 2024-06-19 DIAGNOSIS — M5416 Radiculopathy, lumbar region: Secondary | ICD-10-CM | POA: Insufficient documentation

## 2024-06-19 DIAGNOSIS — M51379 Other intervertebral disc degeneration, lumbosacral region without mention of lumbar back pain or lower extremity pain: Secondary | ICD-10-CM

## 2024-08-07 ENCOUNTER — Other Ambulatory Visit: Payer: Self-pay

## 2024-08-07 DIAGNOSIS — R202 Paresthesia of skin: Secondary | ICD-10-CM

## 2024-08-27 ENCOUNTER — Ambulatory Visit: Payer: Self-pay

## 2024-08-27 NOTE — Telephone Encounter (Signed)
 Pt states recently fractured his femur and cannot get up to come into the office. Asking if his aide can bring in a urine sample. Please call pt at primary number.  FYI Only or Action Required?: Action required by provider: clinical question for provider.  Patient was last seen in primary care on 01/24/2023 by Dettinger, Fonda LABOR, MD.  Called Nurse Triage reporting Advice Only.  Symptoms began a week ago.  Interventions attempted: Nothing.  Symptoms are: unchanged.  Triage Disposition: See PCP When Office is Open (Within 3 Days)  Patient/caregiver understands and will follow disposition?: No, wishes to speak with PCP Reason for Disposition  All other penis - scrotum symptoms  (Exception: Painless rash < 24 hours duration.)  Answer Assessment - Initial Assessment Questions Pt requesting refill of acyclovir d/t 1/10 burning of urethra, but denies burning while urinating. Denies discharge.   1. SYMPTOM: What's the main symptom you're concerned about? (e.g., blood in semen, discharge or pus from penis, itching, pain, rash, swelling)     Burning of urethra 2. LOCATION:      Urethra 3. ONSET:      One week 4. PAIN: Is there any pain? If Yes, ask: How bad is it?  (Scale 1-10; or mild, moderate, severe)     1/10 5. URINE: Any difficulty passing urine? If Yes, ask: When was the last time?     Denies 7. OTHER SYMPTOMS: Do you have any other symptoms? (e.g., blood in urine, abdomen pain, fever)     Denies  Protocols used: Penis and Scrotum Symptoms-A-AH Copied from CRM #[id number]. Topic: Clinical - Medication Question >> Aug 27, 2024 11:32 AM Larissa RAMAN wrote: Reason for CRM: Patient is requesting a refill on his STD medication, he is unsure of the name.   [facility] - [place], [place] - [address] [place] [address] Phone: [phone] Fax: [phone] Hours: Not open 24 hours

## 2024-08-27 NOTE — Telephone Encounter (Signed)
 Duplicate; addressed by alternative triage nurse   Message from Henning G sent at 08/27/2024  9:59 AM EST  Summary: tip of penis burning   Reason for Triage: Patient Derek [name] calling states his penis tip sometimes has a burning feeling, he questions if he has an STD  Please advise

## 2024-08-28 ENCOUNTER — Ambulatory Visit: Payer: Self-pay

## 2024-08-28 NOTE — Telephone Encounter (Signed)
 FYI Only or Action Required?: Action required by provider: clinical question for provider.  Patient was last seen in primary care on 01/24/2023 by Dettinger, Fonda LABOR, MD.  Called Nurse Triage reporting Follow-up.  Symptoms began several days ago.  Interventions attempted: Nothing.  Symptoms are: unchanged.  Triage Disposition: Information or Advice Only Call  Patient/caregiver understands and will follow disposition?: Yes   Reason for Disposition  [1] Follow-up call to recent contact AND [2] information only call, no triage required  Answer Assessment - Initial Assessment Questions 1. REASON FOR CALL: What is the main reason for your call? or How can I best help you?     Patient was triaged yesterday for Urethral burning and informed he needed to come in for an appt. Patient states that he is not able to come in due to recent fracture and limited mobility. He is asking about bringing in urine sample.   2. SYMPTOMS : Do you have any symptoms?      Denies any new symptoms  3. OTHER QUESTIONS: Do you have any other questions?     No  Protocols used: Information Only Call - No Triage-A-AH   Reason for Triage: patient spoke with Nt regarding getting an rx, stated he's not able take come in for an appt

## 2024-08-28 NOTE — Telephone Encounter (Signed)
 LMTCB to schedule appt

## 2024-08-29 NOTE — Telephone Encounter (Signed)
 Pt said he was going to [place]

## 2024-08-29 NOTE — Telephone Encounter (Signed)
 Patient has not been seen in this office in over a year per office policy he will have to be seen in person for this.

## 2024-09-06 ENCOUNTER — Encounter: Payer: Self-pay | Admitting: Neurology

## 2024-10-18 ENCOUNTER — Encounter: Payer: Self-pay | Admitting: Neurology
